# Patient Record
Sex: Male | Born: 1940
Health system: Southern US, Community
[De-identification: ages and names within clinical notes are randomized; demographics above are authoritative.]

## PROBLEM LIST (undated history)

## (undated) DIAGNOSIS — E785 Hyperlipidemia, unspecified: Secondary | ICD-10-CM

## (undated) DIAGNOSIS — H01029 Squamous blepharitis unspecified eye, unspecified eyelid: Secondary | ICD-10-CM

## (undated) DIAGNOSIS — R7989 Other specified abnormal findings of blood chemistry: Secondary | ICD-10-CM

## (undated) DIAGNOSIS — T387X5A Adverse effect of androgens and anabolic congeners, initial encounter: Secondary | ICD-10-CM

## (undated) DIAGNOSIS — IMO0002 Reserved for concepts with insufficient information to code with codable children: Secondary | ICD-10-CM

## (undated) DIAGNOSIS — E1165 Type 2 diabetes mellitus with hyperglycemia: Secondary | ICD-10-CM

## (undated) DIAGNOSIS — M818 Other osteoporosis without current pathological fracture: Secondary | ICD-10-CM

## (undated) DIAGNOSIS — H3530 Unspecified macular degeneration: Secondary | ICD-10-CM

## (undated) DIAGNOSIS — H547 Unspecified visual loss: Secondary | ICD-10-CM

## (undated) DIAGNOSIS — N400 Enlarged prostate without lower urinary tract symptoms: Secondary | ICD-10-CM

## (undated) DIAGNOSIS — N200 Calculus of kidney: Secondary | ICD-10-CM

## (undated) DIAGNOSIS — M1990 Unspecified osteoarthritis, unspecified site: Secondary | ICD-10-CM

## (undated) DIAGNOSIS — I10 Essential (primary) hypertension: Secondary | ICD-10-CM

## (undated) DIAGNOSIS — E1139 Type 2 diabetes mellitus with other diabetic ophthalmic complication: Secondary | ICD-10-CM

## (undated) HISTORY — DX: Hyperlipidemia, unspecified: E78.5

## (undated) HISTORY — DX: Type 2 diabetes mellitus with hyperglycemia: E11.65

## (undated) HISTORY — DX: Benign prostatic hyperplasia without lower urinary tract symptoms: N40.0

## (undated) HISTORY — DX: Unspecified macular degeneration: H35.30

## (undated) HISTORY — DX: Adverse effect of androgens and anabolic congeners, initial encounter: T38.7X5A

## (undated) HISTORY — DX: Squamous blepharitis unspecified eye, unspecified eyelid: H01.029

## (undated) HISTORY — DX: Reserved for concepts with insufficient information to code with codable children: IMO0002

## (undated) HISTORY — DX: Other specified abnormal findings of blood chemistry: R79.89

## (undated) HISTORY — DX: Essential (primary) hypertension: I10

## (undated) HISTORY — DX: Type 2 diabetes mellitus with other diabetic ophthalmic complication: E11.39

## (undated) HISTORY — DX: Other osteoporosis without current pathological fracture: M81.8

## (undated) SURGERY — Surgical Case
Anesthesia: *Unknown

---

## 1996-04-13 HISTORY — PX: BACK SURGERY: SHX140

## 1997-07-27 ENCOUNTER — Ambulatory Visit (HOSPITAL_COMMUNITY): Admission: RE | Admit: 1997-07-27 | Discharge: 1997-07-27 | Payer: Self-pay | Admitting: Specialist

## 2003-10-10 ENCOUNTER — Emergency Department (HOSPITAL_COMMUNITY): Admission: EM | Admit: 2003-10-10 | Discharge: 2003-10-10 | Payer: Self-pay | Admitting: Emergency Medicine

## 2005-09-11 HISTORY — PX: REFRACTIVE SURGERY: SHX103

## 2005-11-25 ENCOUNTER — Encounter: Admission: RE | Admit: 2005-11-25 | Discharge: 2005-11-25 | Payer: Self-pay | Admitting: Internal Medicine

## 2006-01-29 ENCOUNTER — Inpatient Hospital Stay (HOSPITAL_COMMUNITY): Admission: RE | Admit: 2006-01-29 | Discharge: 2006-02-09 | Payer: Self-pay | Admitting: General Surgery

## 2006-01-29 ENCOUNTER — Encounter (INDEPENDENT_AMBULATORY_CARE_PROVIDER_SITE_OTHER): Payer: Self-pay | Admitting: Specialist

## 2006-02-16 ENCOUNTER — Encounter: Admission: RE | Admit: 2006-02-16 | Discharge: 2006-02-16 | Payer: Self-pay | Admitting: Specialist

## 2006-03-02 ENCOUNTER — Encounter: Admission: RE | Admit: 2006-03-02 | Discharge: 2006-03-02 | Payer: Self-pay | Admitting: Specialist

## 2006-03-13 HISTORY — PX: BACK SURGERY: SHX140

## 2006-03-19 ENCOUNTER — Ambulatory Visit: Payer: Self-pay | Admitting: Pulmonary Disease

## 2006-03-19 ENCOUNTER — Inpatient Hospital Stay (HOSPITAL_COMMUNITY): Admission: RE | Admit: 2006-03-19 | Discharge: 2006-03-26 | Payer: Self-pay | Admitting: Specialist

## 2006-06-17 ENCOUNTER — Ambulatory Visit: Payer: Self-pay | Admitting: Physical Medicine & Rehabilitation

## 2006-06-17 ENCOUNTER — Encounter
Admission: RE | Admit: 2006-06-17 | Discharge: 2006-09-15 | Payer: Self-pay | Admitting: Physical Medicine & Rehabilitation

## 2006-07-22 ENCOUNTER — Encounter
Admission: RE | Admit: 2006-07-22 | Discharge: 2006-10-08 | Payer: Self-pay | Admitting: Physical Medicine & Rehabilitation

## 2006-08-10 ENCOUNTER — Ambulatory Visit: Payer: Self-pay | Admitting: Physical Medicine & Rehabilitation

## 2006-08-12 HISTORY — PX: CARPAL TUNNEL RELEASE: SHX101

## 2006-08-27 ENCOUNTER — Ambulatory Visit (HOSPITAL_COMMUNITY): Admission: RE | Admit: 2006-08-27 | Discharge: 2006-08-28 | Payer: Self-pay | Admitting: Specialist

## 2006-09-16 ENCOUNTER — Encounter: Admission: RE | Admit: 2006-09-16 | Discharge: 2006-12-15 | Payer: Self-pay | Admitting: Specialist

## 2006-09-28 ENCOUNTER — Emergency Department (HOSPITAL_COMMUNITY): Admission: EM | Admit: 2006-09-28 | Discharge: 2006-09-28 | Payer: Self-pay | Admitting: Emergency Medicine

## 2006-10-05 ENCOUNTER — Ambulatory Visit: Payer: Self-pay | Admitting: Physical Medicine & Rehabilitation

## 2006-10-05 ENCOUNTER — Encounter
Admission: RE | Admit: 2006-10-05 | Discharge: 2007-01-03 | Payer: Self-pay | Admitting: Physical Medicine & Rehabilitation

## 2006-12-01 ENCOUNTER — Ambulatory Visit: Payer: Self-pay | Admitting: Physical Medicine & Rehabilitation

## 2006-12-31 ENCOUNTER — Encounter
Admission: RE | Admit: 2006-12-31 | Discharge: 2007-03-31 | Payer: Self-pay | Admitting: Physical Medicine & Rehabilitation

## 2007-02-21 HISTORY — PX: CATARACT EXTRACTION: SUR2

## 2007-03-01 ENCOUNTER — Ambulatory Visit: Payer: Self-pay | Admitting: Physical Medicine & Rehabilitation

## 2007-05-09 ENCOUNTER — Encounter: Admission: RE | Admit: 2007-05-09 | Discharge: 2007-05-09 | Payer: Self-pay | Admitting: Family Medicine

## 2007-05-24 ENCOUNTER — Ambulatory Visit: Payer: Self-pay | Admitting: Physical Medicine & Rehabilitation

## 2007-05-24 ENCOUNTER — Encounter
Admission: RE | Admit: 2007-05-24 | Discharge: 2007-05-25 | Payer: Self-pay | Admitting: Physical Medicine & Rehabilitation

## 2007-06-20 ENCOUNTER — Emergency Department (HOSPITAL_COMMUNITY): Admission: EM | Admit: 2007-06-20 | Discharge: 2007-06-20 | Payer: Self-pay | Admitting: Emergency Medicine

## 2007-08-11 ENCOUNTER — Ambulatory Visit (HOSPITAL_BASED_OUTPATIENT_CLINIC_OR_DEPARTMENT_OTHER): Admission: RE | Admit: 2007-08-11 | Discharge: 2007-08-11 | Payer: Self-pay | Admitting: Urology

## 2007-08-11 HISTORY — PX: KIDNEY STONE SURGERY: SHX686

## 2007-08-11 HISTORY — PX: LITHOTRIPSY: SUR834

## 2007-08-17 ENCOUNTER — Ambulatory Visit: Payer: Self-pay | Admitting: Physical Medicine & Rehabilitation

## 2007-08-17 ENCOUNTER — Encounter
Admission: RE | Admit: 2007-08-17 | Discharge: 2007-08-17 | Payer: Self-pay | Admitting: Physical Medicine & Rehabilitation

## 2007-11-11 ENCOUNTER — Encounter
Admission: RE | Admit: 2007-11-11 | Discharge: 2008-01-11 | Payer: Self-pay | Admitting: Physical Medicine & Rehabilitation

## 2007-11-15 ENCOUNTER — Ambulatory Visit: Payer: Self-pay | Admitting: Physical Medicine & Rehabilitation

## 2008-01-11 ENCOUNTER — Ambulatory Visit: Payer: Self-pay | Admitting: Physical Medicine & Rehabilitation

## 2008-01-20 ENCOUNTER — Ambulatory Visit (HOSPITAL_COMMUNITY)
Admission: RE | Admit: 2008-01-20 | Discharge: 2008-01-20 | Payer: Self-pay | Admitting: Physical Medicine & Rehabilitation

## 2008-02-07 ENCOUNTER — Encounter
Admission: RE | Admit: 2008-02-07 | Discharge: 2008-05-07 | Payer: Self-pay | Admitting: Physical Medicine & Rehabilitation

## 2008-02-07 ENCOUNTER — Ambulatory Visit: Payer: Self-pay | Admitting: Physical Medicine & Rehabilitation

## 2008-02-13 ENCOUNTER — Encounter
Admission: RE | Admit: 2008-02-13 | Discharge: 2008-04-10 | Payer: Self-pay | Admitting: Physical Medicine & Rehabilitation

## 2008-02-21 ENCOUNTER — Inpatient Hospital Stay (HOSPITAL_COMMUNITY): Admission: EM | Admit: 2008-02-21 | Discharge: 2008-02-23 | Payer: Self-pay | Admitting: Emergency Medicine

## 2008-02-22 ENCOUNTER — Ambulatory Visit: Payer: Self-pay | Admitting: Vascular Surgery

## 2008-02-22 ENCOUNTER — Encounter (INDEPENDENT_AMBULATORY_CARE_PROVIDER_SITE_OTHER): Payer: Self-pay | Admitting: Emergency Medicine

## 2008-03-07 ENCOUNTER — Ambulatory Visit: Payer: Self-pay | Admitting: Physical Medicine & Rehabilitation

## 2008-05-08 ENCOUNTER — Encounter
Admission: RE | Admit: 2008-05-08 | Discharge: 2008-08-06 | Payer: Self-pay | Admitting: Physical Medicine & Rehabilitation

## 2008-05-09 ENCOUNTER — Ambulatory Visit: Payer: Self-pay | Admitting: Physical Medicine & Rehabilitation

## 2008-05-11 ENCOUNTER — Encounter
Admission: RE | Admit: 2008-05-11 | Discharge: 2008-06-25 | Payer: Self-pay | Admitting: Physical Medicine & Rehabilitation

## 2008-05-28 ENCOUNTER — Ambulatory Visit: Payer: Self-pay | Admitting: Physical Medicine & Rehabilitation

## 2008-06-08 ENCOUNTER — Ambulatory Visit (HOSPITAL_COMMUNITY)
Admission: RE | Admit: 2008-06-08 | Discharge: 2008-06-08 | Payer: Self-pay | Admitting: Physical Medicine & Rehabilitation

## 2008-06-25 ENCOUNTER — Ambulatory Visit: Payer: Self-pay | Admitting: Physical Medicine & Rehabilitation

## 2008-07-20 ENCOUNTER — Ambulatory Visit: Payer: Self-pay | Admitting: Physical Medicine & Rehabilitation

## 2008-09-10 ENCOUNTER — Inpatient Hospital Stay (HOSPITAL_COMMUNITY): Admission: EM | Admit: 2008-09-10 | Discharge: 2008-09-14 | Payer: Self-pay | Admitting: Emergency Medicine

## 2008-09-12 ENCOUNTER — Encounter
Admission: RE | Admit: 2008-09-12 | Discharge: 2008-12-11 | Payer: Self-pay | Admitting: Physical Medicine & Rehabilitation

## 2008-09-16 ENCOUNTER — Emergency Department (HOSPITAL_COMMUNITY): Admission: EM | Admit: 2008-09-16 | Discharge: 2008-09-16 | Payer: Self-pay | Admitting: Emergency Medicine

## 2008-10-04 ENCOUNTER — Ambulatory Visit (HOSPITAL_COMMUNITY): Admission: RE | Admit: 2008-10-04 | Discharge: 2008-10-04 | Payer: Self-pay | Admitting: Urology

## 2008-10-09 ENCOUNTER — Encounter
Admission: RE | Admit: 2008-10-09 | Discharge: 2008-10-09 | Payer: Self-pay | Admitting: Physical Medicine & Rehabilitation

## 2008-10-09 ENCOUNTER — Ambulatory Visit: Payer: Self-pay | Admitting: Physical Medicine & Rehabilitation

## 2008-11-07 ENCOUNTER — Ambulatory Visit: Payer: Self-pay | Admitting: Physical Medicine & Rehabilitation

## 2008-11-19 ENCOUNTER — Ambulatory Visit (HOSPITAL_COMMUNITY)
Admission: RE | Admit: 2008-11-19 | Discharge: 2008-11-19 | Payer: Self-pay | Admitting: Physical Medicine & Rehabilitation

## 2008-12-24 ENCOUNTER — Encounter
Admission: RE | Admit: 2008-12-24 | Discharge: 2008-12-26 | Payer: Self-pay | Admitting: Physical Medicine & Rehabilitation

## 2008-12-26 ENCOUNTER — Ambulatory Visit: Payer: Self-pay | Admitting: Physical Medicine & Rehabilitation

## 2009-04-16 ENCOUNTER — Encounter
Admission: RE | Admit: 2009-04-16 | Discharge: 2009-07-15 | Payer: Self-pay | Admitting: Physical Medicine & Rehabilitation

## 2009-04-17 ENCOUNTER — Ambulatory Visit: Payer: Self-pay | Admitting: Physical Medicine & Rehabilitation

## 2009-07-31 ENCOUNTER — Encounter
Admission: RE | Admit: 2009-07-31 | Discharge: 2009-08-07 | Payer: Self-pay | Admitting: Physical Medicine & Rehabilitation

## 2009-08-07 ENCOUNTER — Ambulatory Visit: Payer: Self-pay | Admitting: Physical Medicine & Rehabilitation

## 2009-08-19 ENCOUNTER — Encounter: Admission: RE | Admit: 2009-08-19 | Discharge: 2009-09-18 | Payer: Self-pay | Admitting: Family Medicine

## 2010-01-24 ENCOUNTER — Encounter
Admission: RE | Admit: 2010-01-24 | Discharge: 2010-01-24 | Payer: Self-pay | Source: Home / Self Care | Attending: Physical Medicine & Rehabilitation | Admitting: Physical Medicine & Rehabilitation

## 2010-04-30 ENCOUNTER — Encounter
Admission: RE | Admit: 2010-04-30 | Discharge: 2010-04-30 | Payer: Self-pay | Source: Home / Self Care | Attending: Family Medicine | Admitting: Family Medicine

## 2010-07-16 ENCOUNTER — Other Ambulatory Visit: Payer: Self-pay | Admitting: Family Medicine

## 2010-07-16 DIAGNOSIS — I6529 Occlusion and stenosis of unspecified carotid artery: Secondary | ICD-10-CM

## 2010-07-21 ENCOUNTER — Ambulatory Visit
Admission: RE | Admit: 2010-07-21 | Discharge: 2010-07-21 | Disposition: A | Discharge status: Home / Self Care | Payer: PRIVATE HEALTH INSURANCE | Source: Ambulatory Visit | Attending: Family Medicine | Admitting: Family Medicine

## 2010-07-21 ENCOUNTER — Other Ambulatory Visit: Payer: Self-pay

## 2010-07-21 DIAGNOSIS — I6529 Occlusion and stenosis of unspecified carotid artery: Secondary | ICD-10-CM

## 2010-07-21 LAB — GLUCOSE, CAPILLARY
Glucose-Capillary: 149 mg/dL — ABNORMAL HIGH (ref 70–99)
Glucose-Capillary: 240 mg/dL — ABNORMAL HIGH (ref 70–99)
Glucose-Capillary: 115 mg/dL — ABNORMAL HIGH (ref 70–99)
Glucose-Capillary: 117 mg/dL — ABNORMAL HIGH (ref 70–99)
Glucose-Capillary: 123 mg/dL — ABNORMAL HIGH (ref 70–99)
Glucose-Capillary: 130 mg/dL — ABNORMAL HIGH (ref 70–99)
Glucose-Capillary: 146 mg/dL — ABNORMAL HIGH (ref 70–99)
Glucose-Capillary: 147 mg/dL — ABNORMAL HIGH (ref 70–99)
Glucose-Capillary: 149 mg/dL — ABNORMAL HIGH (ref 70–99)
Glucose-Capillary: 167 mg/dL — ABNORMAL HIGH (ref 70–99)
Glucose-Capillary: 169 mg/dL — ABNORMAL HIGH (ref 70–99)
Glucose-Capillary: 205 mg/dL — ABNORMAL HIGH (ref 70–99)
Glucose-Capillary: 211 mg/dL — ABNORMAL HIGH (ref 70–99)
Glucose-Capillary: 212 mg/dL — ABNORMAL HIGH (ref 70–99)
Glucose-Capillary: 217 mg/dL — ABNORMAL HIGH (ref 70–99)
Glucose-Capillary: 97 mg/dL (ref 70–99)

## 2010-07-21 LAB — BASIC METABOLIC PANEL WITH GFR
BUN: 20 mg/dL (ref 6–23)
CO2: 25 meq/L (ref 19–32)
Calcium: 7.9 mg/dL — ABNORMAL LOW (ref 8.4–10.5)
Chloride: 100 meq/L (ref 96–112)
Creatinine, Ser: 1.57 mg/dL — ABNORMAL HIGH (ref 0.4–1.5)
GFR calc non Af Amer: 44 mL/min — ABNORMAL LOW
Glucose, Bld: 228 mg/dL — ABNORMAL HIGH (ref 70–99)
Potassium: 3.8 meq/L (ref 3.5–5.1)
Sodium: 135 meq/L (ref 135–145)

## 2010-07-21 LAB — CBC
HCT: 35.8 % — ABNORMAL LOW (ref 39.0–52.0)
HCT: 37.1 % — ABNORMAL LOW (ref 39.0–52.0)
HCT: 38.6 % — ABNORMAL LOW (ref 39.0–52.0)
Hemoglobin: 11.9 g/dL — ABNORMAL LOW (ref 13.0–17.0)
Hemoglobin: 12.6 g/dL — ABNORMAL LOW (ref 13.0–17.0)
Hemoglobin: 13.1 g/dL (ref 13.0–17.0)
MCHC: 33.2 g/dL (ref 30.0–36.0)
MCHC: 33.8 g/dL (ref 30.0–36.0)
MCHC: 33.9 g/dL (ref 30.0–36.0)
MCV: 92.3 fL (ref 78.0–100.0)
MCV: 92.7 fL (ref 78.0–100.0)
MCV: 92.7 fL (ref 78.0–100.0)
Platelets: 165 10*3/uL (ref 150–400)
Platelets: 187 10*3/uL (ref 150–400)
Platelets: 196 10*3/uL (ref 150–400)
RBC: 3.86 MIL/uL — ABNORMAL LOW (ref 4.22–5.81)
RBC: 4.02 MIL/uL — ABNORMAL LOW (ref 4.22–5.81)
RBC: 4.17 MIL/uL — ABNORMAL LOW (ref 4.22–5.81)
RDW: 14 % (ref 11.5–15.5)
RDW: 14.3 % (ref 11.5–15.5)
RDW: 14.4 % (ref 11.5–15.5)
WBC: 10.2 10*3/uL (ref 4.0–10.5)
WBC: 10.9 10*3/uL — ABNORMAL HIGH (ref 4.0–10.5)
WBC: 13.9 10*3/uL — ABNORMAL HIGH (ref 4.0–10.5)

## 2010-07-21 LAB — CULTURE, BLOOD (ROUTINE X 2)
Culture: NO GROWTH
Culture: NO GROWTH

## 2010-07-21 LAB — BASIC METABOLIC PANEL
BUN: 11 mg/dL (ref 6–23)
BUN: 17 mg/dL (ref 6–23)
BUN: 17 mg/dL (ref 6–23)
CO2: 27 mEq/L (ref 19–32)
CO2: 28 mEq/L (ref 19–32)
CO2: 29 mEq/L (ref 19–32)
Calcium: 7.3 mg/dL — ABNORMAL LOW (ref 8.4–10.5)
Calcium: 7.5 mg/dL — ABNORMAL LOW (ref 8.4–10.5)
Calcium: 7.6 mg/dL — ABNORMAL LOW (ref 8.4–10.5)
Chloride: 104 mEq/L (ref 96–112)
Chloride: 106 mEq/L (ref 96–112)
Chloride: 98 mEq/L (ref 96–112)
Creatinine, Ser: 1.3 mg/dL (ref 0.4–1.5)
Creatinine, Ser: 1.44 mg/dL (ref 0.4–1.5)
Creatinine, Ser: 1.45 mg/dL (ref 0.4–1.5)
GFR calc Af Amer: 59 mL/min — ABNORMAL LOW (ref >60)
GFR calc Af Amer: 59 mL/min — ABNORMAL LOW (ref >60)
GFR calc Af Amer: >60 mL/min (ref >60)
GFR calc non Af Amer: 49 mL/min — ABNORMAL LOW (ref >60)
GFR calc non Af Amer: 49 mL/min — ABNORMAL LOW (ref >60)
GFR calc non Af Amer: 55 mL/min — ABNORMAL LOW (ref >60)
Glucose, Bld: 151 mg/dL — ABNORMAL HIGH (ref 70–99)
Glucose, Bld: 153 mg/dL — ABNORMAL HIGH (ref 70–99)
Glucose, Bld: 167 mg/dL — ABNORMAL HIGH (ref 70–99)
Potassium: 3 mEq/L — ABNORMAL LOW (ref 3.5–5.1)
Potassium: 3.9 mEq/L (ref 3.5–5.1)
Potassium: 4.4 mEq/L (ref 3.5–5.1)
Sodium: 132 mEq/L — ABNORMAL LOW (ref 135–145)
Sodium: 137 mEq/L (ref 135–145)
Sodium: 139 mEq/L (ref 135–145)

## 2010-07-21 LAB — MAGNESIUM: Magnesium: 1.9 mg/dL (ref 1.5–2.5)

## 2010-07-22 LAB — DIFFERENTIAL
Basophils Absolute: 0 10*3/uL (ref 0.0–0.1)
Basophils Relative: 0 % (ref 0–1)
Eosinophils Absolute: 0 10*3/uL (ref 0.0–0.7)
Eosinophils Relative: 0 % (ref 0–5)
Lymphocytes Relative: 9 % — ABNORMAL LOW (ref 12–46)
Lymphs Abs: 1 10*3/uL (ref 0.7–4.0)
Monocytes Absolute: 0.6 10*3/uL (ref 0.1–1.0)
Monocytes Relative: 6 % (ref 3–12)
Neutro Abs: 9.6 10*3/uL — ABNORMAL HIGH (ref 1.7–7.7)
Neutrophils Relative %: 85 % — ABNORMAL HIGH (ref 43–77)

## 2010-07-22 LAB — URINE CULTURE: Colony Count: >=100000

## 2010-07-22 LAB — COMPREHENSIVE METABOLIC PANEL
ALT: 18 U/L (ref 0–53)
AST: 24 U/L (ref 0–37)
Albumin: 3.3 g/dL — ABNORMAL LOW (ref 3.5–5.2)
Alkaline Phosphatase: 66 U/L (ref 39–117)
BUN: 22 mg/dL (ref 6–23)
CO2: 27 mEq/L (ref 19–32)
Calcium: 8.8 mg/dL (ref 8.4–10.5)
Chloride: 96 mEq/L (ref 96–112)
Creatinine, Ser: 1.73 mg/dL — ABNORMAL HIGH (ref 0.4–1.5)
GFR calc Af Amer: 48 mL/min — ABNORMAL LOW (ref >60)
GFR calc non Af Amer: 40 mL/min — ABNORMAL LOW (ref >60)
Glucose, Bld: 159 mg/dL — ABNORMAL HIGH (ref 70–99)
Potassium: 3.6 mEq/L (ref 3.5–5.1)
Sodium: 135 mEq/L (ref 135–145)
Total Bilirubin: 1.2 mg/dL (ref 0.3–1.2)
Total Protein: 7.4 g/dL (ref 6.0–8.3)

## 2010-07-22 LAB — URINE MICROSCOPIC-ADD ON

## 2010-07-22 LAB — CBC
HCT: 43.9 % (ref 39.0–52.0)
Hemoglobin: 14 g/dL (ref 13.0–17.0)
MCHC: 31.9 g/dL (ref 30.0–36.0)
MCV: 92.8 fL (ref 78.0–100.0)
Platelets: 236 10*3/uL (ref 150–400)
RBC: 4.74 MIL/uL (ref 4.22–5.81)
RDW: 14 % (ref 11.5–15.5)
WBC: 11.2 10*3/uL — ABNORMAL HIGH (ref 4.0–10.5)

## 2010-07-22 LAB — URINALYSIS, ROUTINE W REFLEX MICROSCOPIC
Glucose, UA: 100 mg/dL — AB
Ketones, ur: >80 mg/dL — AB
Nitrite: POSITIVE — AB
Protein, ur: 100 mg/dL — AB
Specific Gravity, Urine: 1.026 (ref 1.005–1.030)
Urobilinogen, UA: 1 mg/dL (ref 0.0–1.0)
pH: 6.5 (ref 5.0–8.0)

## 2010-07-22 LAB — LIPASE, BLOOD: Lipase: 16 U/L (ref 11–59)

## 2010-07-22 LAB — GLUCOSE, CAPILLARY: Glucose-Capillary: 186 mg/dL — ABNORMAL HIGH (ref 70–99)

## 2010-08-26 NOTE — Assessment & Plan Note (Signed)
Scott Willis is back regarding his chronic pain.  He has had some issues  with his eyes and with some kidney stones since I last saw him.  His  back, actually, has been doing fairly well.  The amitriptyline has  helped his paresthesias at night.  He has run out of his MS Contin now  for a few weeks, and only was taking it on a daily basis, regardless.  He feels that he is at a point where he can try to stay off of the  medication.  He rates his pain at rest in the back and extremities at a  3 out of 10.  He describes it as burning, aching, sometimes tingling.  Sleep is poor.  Pain usually increases with prolonged sitting and  standing and while he is at work.  Improves with rest, his medications,  his back brace, etc.   REVIEW OF SYSTEMS:  Review of systems are notable for the above.  He  still has some tingling in the hands, but pain has improved.  He has had  some weight loss, constipation, pain.  His vision continues to be a  problem.   SOCIAL HISTORY:  The patient lives alone and is smoking 1+ pack a day.   PHYSICAL EXAMINATION:  Blood pressure 131/58, pulse is 98, respiratory  rate 18, he is satting 97% on room air.  The patient is pleasant, alert and oriented x3.  Affect is bright and  appropriate.  Vision is still poor.  His vision is not functional in the left eye.  He continues to have decreased sensation over the hands in the ulnar  distribution primarily.  Posture is fair.  He has fair range of motion at the lumbar spine.  Strength is generally  5 out of 5 except for instrinsic weakness of the hands.  Reflexes are  2+.  Sensory exam as noted above.   ASSESSMENT:  1. Chronic low back pain related to lumbar spondylosis, compression      fracture, post laminectomy syndrome/facet arthropathy.  2. Bilateral ulnar nerve and median nerve compression, right greater      than left.  3. History of cancerous polyps.  4. History of peripheral neuropathy.  5. Diabetes.  6. Tobacco  abuse.  7. Hypertension.  8. History of nephrolithiasis.   PLAN:  1. Continue Neurontin and Elavil as dosed.  2. Recommended brace wear as needed, particularly if his back fatigue      becomes painful at work.  I would not make it a routine to wear it      every day on a scheduled basis.  3. Lidoderm patches p.r.n.  4. Will not fill MS Contin today.  Observe for his pain tolerance      without.  He does have a few hydrocodones through Dr. Imelda Pillow      office for his procedures.  He can use these for his breakthrough      pain if needed for      now.  5. I will see him back in about 6 months' time.      Ranelle Oyster, M.D.  Electronically Signed     ZTS/MedQ  D:  08/17/2007 10:23:36  T:  08/17/2007 11:20:52  Job #:  [id number]   cc:   Lillia Carmel, M.D.  Fax: [phone]

## 2010-08-26 NOTE — H&P (Signed)
NAME:  Scott Willis, Scott Willis NO.:  [id number]   MEDICAL RECORD NO.:  [id number]          PATIENT TYPE:  EMS   LOCATION:  ED                           FACILITY:  [facility]   PHYSICIAN:  Zannie Cove, MD     DATE OF BIRTH:  1941/01/25   DATE OF ADMISSION:  09/10/2008  DATE OF DISCHARGE:                              HISTORY & PHYSICAL   PRIMARY CARE PHYSICIAN:  Lillia Carmel, M.D.   CARDIOLOGIST:  Dr. Ellwood Handler.   CHIEF COMPLAINT:  This is a 70 year old gentleman with a history of  known kidney stones on the right side and chronic back pain who presents  to the emergency room with right-sided flank pain which is severe,  intermittent, sharp, about 10/10, radiating  to the groin.  He has been  bearing severe pain for the last 7-10 days at home. He also reports  fevers and chills, sweats for the last 1 week. He denies any hematuria.  He denies any dysuria or urinary frequency.  He tried to call his  orthopedic surgeon for back pain. However, he was unable to get a hold  of him. He lives at home. He lives alone at his shop.   PAST MEDICAL HISTORY:  Significant for  1,  Diabetes.  1. Chronic back pain.  2. History of kidney stones.  3. Hypertension.  4. Dyslipidemia.  5. Osteoporosis.   PAST SURGICAL HISTORY:  Back surgery x2, appendectomy, hemicolectomy,  and laser surgery for kidney stone on the left side in April of 2009.   ALLERGIES:  No known drug allergies.   MEDICATIONS:  Metformin 850 mg p.o. t.i.d., gabapentin 600 mg p.o.  t.i.d., Cymbalta 60 mg once per day, vitamin D 50,000 Units 1 p.o. once  a week, aspirin 81 mg once per day, and testosterone injections for his  osteoporosis.   ALLERGIES:  No known drug allergies.   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  He has smoked about 1  pack per day for the past 50 years. He denies any alcohol.   FAMILY HISTORY:  Significant for diabetes.   REVIEW OF SYSTEMS:  A 12-system review was negative  except for as in  HPI.   PHYSICAL EXAMINATION:  VITAL SIGNS:  T max 102.4.  Current temperature  100.9, pulse 99, blood pressure 174/82, improved to 144/80.  Respirations 18, satting 98% on 2 L.  GENERAL:  Awake and alert, oriented x 3.  Intermittently in moderate  distress secondary to pain.  HEENT: Pupils are equal, round, and reactive to light.  CARDIOVASCULAR: S1 and S2. Regular rate and rhythm.  LUNGS:  Clear to auscultation.  ABDOMEN:  Soft and nontender and nondistended.  Positive bowel sounds.  Severe right flank and CVA tenderness.  EXTREMITIES:  No clubbing, cyanosis, or edema.  NEUROLOGICAL: Exam is nonfocal.   LABS:  White count 11.2, neutrophils 355, hemoglobin 14, platelets 236.  Sodium 136, potassium 2.6, chloride 96, bicarb 27, BUN 22, creatinine  1.7, up from 1.1.  Glucose 159, lipase 16.  UA is amber color cloudy  with ketones greater than  80, large amount of blood, protein 100,  moderate leukocytes, positive nitrites, WBCs too many to count, RBCs too  many to count, bacteria many.   ASSESSMENT AND PLAN:  56. A 70 year old gentleman with acute complicated pyelonephritis in      the setting of diabetes and kidney stone.  Will get a CT of his      abdomen to noncontrast stone protocol.  Will start the patient on      IV cefepime.  Will check urine culture and blood cultures x2. If      there is evidence of obstructive stone or hydronephrosis, will get      a urology consult.  IV fluids, normal saline at 100 mL an hour.      Pain control with Dilaudid 2-4 mg q.2-4 h. p.r.n.  2. Acute renal failure and chronic kidney disease likely secondary to      dehydration.  In addition, noncontrast CT of the abdomen to      evaluate for obstruction.  Will follow his I's and O's ,      chemistries,hydrate with normal saline at 100 mL an hour.  3. Diabetes.  Hold metformin.  Put him on an ADA diet and sliding-      scale insulin.  4. Deep vein thrombosis prophylaxis with Lovenox  30 mg daily.  5. The patient is a full code.      Zannie Cove, MD  Electronically Signed     PJ/MEDQ  D:  09/10/2008  T:  09/10/2008  Job:  [id number]

## 2010-08-26 NOTE — Consult Note (Signed)
NAME:  COHAN, STIPES NO.:  [id number]   MEDICAL RECORD NO.:  [id number]          PATIENT TYPE:  INP   LOCATION:  0108                         FACILITY:  [facility]   PHYSICIAN:  Lindaann Slough, M.D.  DATE OF BIRTH:  1940/06/09   DATE OF CONSULTATION:  09/10/2008  DATE OF DISCHARGE:                                 CONSULTATION   REASON FOR CONSULTATION:  Fever, right flank pain, and right upper  ureteral stone.   HISTORY OF PRESENT ILLNESS:  The patient is a 70 year old male who came  to the emergency room today with a 10-day history of back and right  flank pain.  He has a history of back pain and had back surgery in 1998  and December 2007.  He was doing well until 05/20 when he fell down and  hurt his back.  Since then he has been having back and right flank pain  on and off.  The pain got progressively worse and he came to the  emergency room today.  He had a CT scan that showed bilateral renal  calculi, moderate right hydronephrosis, and an 11 mm proximal right  ureteral stone.  There is mild bilateral perinephric stranding.  The  patient has a past history of kidney stone and had left ureteral stone  extraction done on August 11, 2007 by Dr. Patsi Sears.  He does not have  any voiding symptoms.  He has no frequency, urgency, dysuria, or  hematuria.  He stated that he had a normal PSA at his primary care  physician's office.   PAST MEDICAL HISTORY:  1. Diabetes.  2. Hypertension.  3. Hypercholesterolemia.  4. Macular degeneration.   PAST SURGICAL HISTORY:  1. He had colon resection in July 2007 for a large polyp.  2. He had back surgery in July 1998 and December 2007.  3. He had carpal tunnel surgery in May 2008.  4. Cataract surgery in November 2008.  5. Left ureteral stone extraction on August 11, 2007.   FAMILY HISTORY:  His father died of prostate cancer 32 years ago.  His  mother is deceased of unknown causes to him.   SOCIAL HISTORY:  He is  divorced.  He has one daughter.  He has smoked 1  pack a day for 54 years and does not drink.   ALLERGIES:  He has no known drug allergies.  He has allergic to SURGICAL  TAPE and BEE STING.   MEDICATIONS:  1. Metformin 850 mg 3 times a day.  2. Cymbalta 60 mg a day.  3. Vitamin D 50,000 units a week.  4. Aspirin 81 mg a day.  5. Gabapentin 600 mg 3 times a day.  6. Testosterone injection for osteoporosis.   REVIEW OF SYSTEMS:  As noted in the HPI and everything else is negative.   PHYSICAL EXAMINATION:  GENERAL:  This is a well-developed 70 year old  male who is complaining of back and right flank pain.  He is alert and  oriented to time, place, and person.  VITAL SIGNS:  Blood pressure is 174/82, pulse 99, temperature maximum  102.4.  HEENT:  His head is normal.  He has pink conjunctivae.  Ears, nose, and  throat are within normal limits.  NECK:  Supple.  He has no cervical adenopathy.  No thyromegaly.  CHEST:  Symmetrical.  LUNGS:  Fully expanded and clear to percussion and auscultation.  HEART:  Regular rhythm.  No murmur, no gallops.  ABDOMEN:  Soft.  He has a well-healed surgical scar in the right upper  quadrant.  He has some surgical scar in his back.  He has moderate right  CVA tenderness.  Kidneys are not palpable.  Bladder is not distended.  He has no hepatomegaly, no splenomegaly.  He has a right inguinal  hernia.  Bowel sounds are normal.  GENITALIA:  Penis is circumcised.  Meatus is normal.  Scrotum is normal.  He has no testicular mass.  Cords and epididymis are within normal  limits.  RECTAL:  He has no external hemorrhoids.  Sphincter tone is normal.  Prostate is enlarged to 50 g without any nodules and seminal vesicles  are not palpable.  EXTREMITIES:  Within normal limits.  He has no pedal edema.  No  deformities.  He has good peripheral pulses.   LABORATORY:  Hemoglobin is 14.0, hematocrit 43.9, WBC 11.2, BUN 22,  creatinine 1.73.  Sodium 135, potassium  3.6.  Urine is cloudy and  has  many bacteria, too numerous to count RBCs, too numerous to count WBCs.   I have independently reviewed the CT scan and it shows bilateral renal  stone.  Moderate right hydronephrosis, down to an 11 mm stone in the  proximal ureter.   IMPRESSION:  1. Bilateral renal calculi.  2. Right hydronephrosis.  3. Right proximal ureteral stone.  4. Urinary tract infection.  5. Diabetes.  6. Hypertension.   PLAN:  The patient needs cystoscopy and insertion of double-J stent.  The procedure, risks and benefits were discussed with the patient.  He  understands and agrees to proceed.      Lindaann Slough, M.D.  Electronically Signed     MN/MEDQ  D:  09/10/2008  T:  09/10/2008  Job:  [id number]

## 2010-08-26 NOTE — Assessment & Plan Note (Signed)
Scott Willis is back regarding his chronic back pain and leg symptoms.  He  saw Dr. Prince Rome recently and Dr. Prince Rome was wondering about other  treatments for his back. We had reviewed potential treatments at length  at out last visit here in October.  The patient only seemed to recall  partially the conversation.  He has been having physical therapy.  He  apparently went to the hospital after a syncopal episode.  He was told  that it was likely due to narcotic medications.  He had been on MS  Contin but had not taken it for 3 or 4 days prior to this event and only  had 1 immediate release Morphine that morning which was usually his  routine.  He is back in therapy now.  He has had no other spells where  he has gotten lightheaded.  The patient rates his pain 3/10 and usually  it is in the right mid-to-low back.  The pain seemed to be a bit higher  today in his description than we had discussed previously.  We reviewed  his MRI again which is notable for the old T12 compression fracture with  3 mm of retropulsion of bone into the spinal canal.  He has L4-L5 and  L5-S1 mild disk disease with facet hypertrophy bilaterally more in the  right. The patient still is doing his work.  He is still having issues  with his eyes regarding his retinopathy.  Sleep is overall poor due to  his back pain.  Dr. Prince Rome started him back on his Cymbalta recently  because he has drug habits for this now again.   REVIEW OF SYSTEMS:  Notable for numbness, tingling, bowel control  problems, weight loss, constipation at times, and occasional abdominal  pain.  Full review is in the written health and history section.  Other  pertinent positives are as above.   SOCIAL HISTORY:  The patient lives alone.  He is divorced. He is still  smoking heavily each day.   PHYSICAL EXAMINATION:  VITAL SIGNS:  Blood pressure 158/80, pulse is 92,  respiratory rate 18, and he is sating 99% on room air.  GENERAL:  The patient is generally  pleasant.  Vision is unchanged. He  stands in a kyphotic posture particularly in the thoracic spine.  He  ambulates with a slightly wide-based gait. He is stable and stands.  He  has pain with palpation over the prior operative segments of his back.  He has a lot of muscle tightness on the right paraspinals from T10-L4.  He tends to rotate a bit to the right in the spine as well.  MUSCULOSKELETAL:  Strength is generally 5/5 with 1+ reflexes.  Sensation  is intermittently invariably decreased particularly in distal limbs.  He  has some mild pain in the lower lumbar segments and towards the SI  joints but this seems to be less in severity compared to the high lumbar  and lower thoracic area.   ASSESSMENT:  1. Persistent back pain likely related to his postlaminectomy and post      hardware removal complications.  He also has a T12 compression      fracture with retropulsion.  There may be some concomitant facet      disease.  Posture is also a big factor here.  We reviewed his MRI      again at length as well as other contributing factors.  2. Diabetes with peripheral neuropathy.  3. Macular degeneration.  4.  Hypertension.  5. Severe tobacco abuse.  6. Ill-defined right hip/groin pain.   PLAN:  1. I agree with resumption of Cymbalta.  I did not really realize that      he was off of it until today.  I Hope that this helps somewhat with      his leg symptoms.  2. Continue with physical therapy to work on strengthening, posture,      and range of motion of his low back.  I think this is really the      key here.  3. We injected 3 trigger points along the right thoracolumbar      paraspinals from T11-L3.  We used 2 mL of 1% lidocaine in each      injection.  The patient tolerated well.  We will see how he      responds to this.  Could consider facet injections perhaps down the      road.  May look at a thoracolumbar extension orthosis as well as      his current brace is really not  appropriate for what his likely      needs are and started to show signs of wear and tear.  4. Continue testosterone supplementation.  5. Continue Elavil and Neurontin as written.  6. We will stop long-acting Morphine and continue with MSIR 15 mg 1      q.8 h. p.r.n.  I gave him 46 today which can be refilled after      March 22, 2008.  7. We discussed again the problems with his tobacco abuse and how this      affects his pain and neuropathy and the patient understands but      likely will not change his habits.  8. I think his episode was likely syncopal in nature at physical      therapy.  It is highly unlikely it was related to his narcotics.  I      asked him to watch closely for signs and further symptoms.  He is      to make sure he is drinking plenty of fluids and taking his time in      changing positions.  He is certainly susceptible for sympathetic      dysfunction considering the severity of his diabetic disease.  9. We will see him back in 6 weeks.      Scott Willis, M.D.  Electronically Signed     ZTS/MedQ  D:  03/07/2008 10:24:04  T:  03/07/2008 20:43:19  Job #:  [id number]   cc:   Scott Willis, M.D.  Fax: [phone]   Scott Willis, M.D.  Fax: [phone]

## 2010-08-26 NOTE — Assessment & Plan Note (Signed)
Scott Willis is back regarding his L3, L4, and L5 right medial branch  blocks.  He had one of the results regarding his back pain and states  that his groin pain as well has improved.  His pain is 3/10 in general.  He does occasionally peak with prolonged sitting or with activity.  He  uses the morphine at night to help him with pain and sleep at night, but  overall is very pleased with his progress.  Pain interferes with general  activity, relations with others, and enjoyment of life on a minimal  level.  The patient remains on Neurontin, also takes Cymbalta 60 mg  q.a.m.   REVIEW OF SYSTEMS:  Notable for numbness, tingling, occasional  constipation.  Other pertinent positives are above.  Full review is in  the written health and history section of the chart.   SOCIAL HISTORY:  The patient lives alone and is divorced.  He is still  smoking a pack of cigarettes per day.  He works full-time with his  [employer].   PHYSICAL EXAMINATION:  Blood pressure 152/62, pulse is 77, respiratory  rate is 18.  He is sating 99% on room air.  The patient is pleasant,  alert and oriented x3.  Affect is bright and appropriate.  He had some  mild pain with lumbar extension and flexion to the right as well as  facet maneuvers.  Scott Willis was mildly tender to palpation today.  Strength  generally is 5/5.  He had no groin pain on the right side.  Posture was  improved.  He was alert.  Balance was excellent.  Heart was regular  rate.  Chest was clear.  Abdomen was soft, nontender.   ASSESSMENT:  1. Chronic low back pain/post-laminectomy syndrome/lumbar facet      arthropathy.  2. Diabetic peripheral neuropathy.  3. Right groin pain resolved after medial branch blocks above.   PLAN:  1. We will continue with Cymbalta, Elavil, and Neurontin as previously      prescribed.  2. Flexeril p.r.n. for spasms.  3. MSIR 15 mg q.12 h p.r.n. breakthrough pain.  4. We will hold off on obturator nerve block as we  have talked about      previously.  5. Encouraged exercise, range of motion, good posture, etc.  6. I will see him back in 4 months.  We will have him check with      nursing in 2 months' time.  I am very pleased with his progress.      Ranelle Oyster, M.D.  Electronically Signed     ZTS/MedQ  D:  07/20/2008 10:41:46  T:  07/20/2008 23:58:17  Job #:  [id number]   cc:   Lillia Carmel, M.D.  Fax: [phone]   Kerrin Champagne, M.D.  Fax: [phone]

## 2010-08-26 NOTE — Assessment & Plan Note (Signed)
Scott Willis is  back regarding his chronic pain.  His back has been doing  better with the back brace.  He is wearing it usually 5 to 6 days as  week throughout the day.  He states that it has made a world of  difference for him.  The Neurontin has helped him a bit with his  dysesthesias in the hands but he still has complaints there.  He has cut  back on some of his other medications and he is feeling better overall  from an energy standpoint.  No new surgical plan has been made for his  hands and it looks as if it is a wait and see type of situation there.  He does not use his TENS currently.  He is continuing with the Tofranil  at bedtime for sleep which helps somewhat.  Patient continues to work  full-time with his [employer].   REVIEW OF SYSTEMS:  Notable for the above.  A full review is in the  written Health & History Section of the chart.   SOCIAL HISTORY:  The patient is working.  He is continuing to try to cut  back on his cigarettes but still smokes quite regularly.   PHYSICAL EXAM:  Blood pressure is 144/76, pulse is 90, respiratory rate  18, sating 99% on room air.  The patient is pleasant, alert and oriented  x3.  He is wearing sunglasses today after a recent cataract surgery.  Gait was stable.  He had some pain with movement of the back,  particularly flexion, extension.  Continues to have ulnar nerve signs in  both hands with wasting noted.  He has distal sensory loss in both  hands.  His affect is bright and appropriate.  Cognition was within  normal limits.   ASSESSMENT:  1. Chronic low back pain related to lumbar spondylosis with      compression fracture/post laminectomy syndrome/facet arthropathy.  2. Bilateral ulnar and medial nerve compression status post surgical      decompression on the right.  3. History of cancerous polyps.  4. History of peripheral neuropathy.   PLAN:  1. Increase Neurontin up to 300 mg t.i.d. over the next 2 weeks' time.  2.  Continue Tofranil at bedtime.  3. New back brace, although I would prefer to see him wear it when he      needs it rather than on a scheduled basis.  A good idea would be to      start his day and go as long as he can until his back begins to      fatigue and then place the brace on the back.  4. Continue MS Contin 15 mg q.6 hours p.r.n. breakthrough pain.  He is      using less of this every time.  5. I will see him back in about 3 months.  He is really doing quite      well overall.      Ranelle Oyster, M.D.  Electronically Signed     ZTS/MedQ  D:  03/02/2007 10:22:53  T:  03/02/2007 15:41:58  Job #:  [id number]   cc:   Gabriel Earing, M.D.  Fax: [phone]

## 2010-08-26 NOTE — Op Note (Signed)
NAME:  Scott Willis, Scott Willis NO.:  [id number]   MEDICAL RECORD NO.:  [id number]          PATIENT TYPE:  AMB   LOCATION:  [facility]                         FACILITY:  [facility]   PHYSICIAN:  Sigmund I. Patsi Sears, Scott WillisDATE OF BIRTH:  11-25-1940   DATE OF PROCEDURE:  08/11/2007  DATE OF DISCHARGE:                               OPERATIVE REPORT   PREOPERATIVE DIAGNOSIS:  Left ureteral stone.   POSTOPERATIVE DIAGNOSIS:  Left ureteral stone.   PROCEDURES:  1. Cystourethroscopy.  2. Urethral dilation.  3. Left retrograde pyelogram with interpretation.  4. Left ureteroscopic stone manipulation with laser lithotripsy.  5. Placement of 6 x 24 contoured double-J stent.   ANESTHESIA:  General/LMA.   INDICATIONS FOR PROCEDURE:  Scott Willis is a 70 year old white male with  history of nephrolithiasis.  He has had a symptomatic stone that was  initially located in his left proximal ureter and now has moved to his  left mid ureter and he has failed medical expulsive therapy.  He  continues to be symptomatic and likewise was scheduled for surgical  management.  Preoperatively, all risks, consequences and benefits were  explained to him and informed consent was obtained.   PROCEDURE IN DETAIL:  The patient is brought to the operating room,  placed in supine position.  He was correctly identified by his wristband  and appropriate time-out was taken.  General anesthesia was delivered  and IV antibiotics were administered.  Once adequately anesthetized, he  was placed in dorsal lithotomy position with great care taken to  minimize the risk of peripheral neuropathy or compartment syndrome.  His  perineum was prepped and draped sterilely.  We begun our procedure by  performing rigid cystourethroscopy with the 22-French rigid cystoscopic  sheath, 12 degrees lens, normal saline as our irrigant.  The anterior  urethra was normal.  He did have a concentric narrowing at the  membranous urethra  which would not safely allow passage of our scope.  Likewise, we removed the scope and subsequently dilated his urethra  serially with male Sissy Hoff sounds beginning at 14-French and going up  to 28-French.  After this, the scope passed easily through this  narrowing.  His prostatic urethra was normal.  Upon entry into the  bladder, clear amber urine was identified.  Pan cystoscopy demonstrated  no significant urothelial abnormalities.  Both ureteral orifices were  noted to be in their normal anatomic position effluxing clear urine.  No  stones were seen.  The left ureteral orifice was cannulated gently with  a 6-French open-ended catheter.  Left ventricular pyelogram was  performed which demonstrated a normal ureter in course and caliber that  is slightly narrowed in the middle but there was no stricture.  A mobile  filling defect consistent with stone was seen just proximal to this in  the mid ureter.  We then advanced a sensor tip guidewire through the  open-ended catheter and directed it into the left renal pelvis with  fluoroscopy.  We then removed the open-ended catheter as well as the  cystoscope.  We then performed semi-rigid ureteroscopy of the left  ureter.  Again the ureteral orifice was easily traversed as was the  distal ureter.  However, he was somewhat narrow through his middle  ureter but there was no focal abnormalities.  With the patient's and  gentle movements, we were able to pass the ureteroscope up to the level  of the stone.  We also employed a second guidewire to allow safe passage  of this scope to the level of the stone.  It appeared to be  approximately 4 mm and it was oriented in a way which would not allow  its passage.  Likewise, we then performed laser lithotripsy with a 275  nanometers laser fiber with our settings at 0.6 joules and 6 Hz.  The  stone fragmented quite nicely with minimal pulsations.  We then removed  the laser fiber,  used a nitinol basket  to remove the two larger stone  fragments atraumatically.  The remaining stone debris behind is 7 mm in  size.  We then removed the ureteroscope and back-loaded the cystoscope  over the remaining guidewire.  We then placed a 6 x 24 double-J contour  stent over the guidewire using direct vision and fluoroscopic guidance.  Once in the appropriate position,  the guidewire was removed leaving the  proximal curl in the left renal pelvis and the distal curl was noted to  be in the bladder.  His bladder was drained and this marked the end of  our procedure.  He was awoken.  LMA was removed.  He was taken to the  recovery room in stable condition.  There were no complications.  Dr.  Patsi Sears was present and participated in all aspects of the case.     ______________________________  Scott Willis, M.D.      Sigmund I. Patsi Sears, M.D.  Electronically Signed    DW/MEDQ  D:  08/11/2007  T:  08/11/2007  Job:  [id number]

## 2010-08-26 NOTE — Assessment & Plan Note (Signed)
Scott Willis is back regarding his persistent back and right groin pain.  He did not have good results with the trigger point injections.  He has  been wearing his brace, but seems to be more dependent on it recently.  He continues on the Cymbalta, Elavil, and Neurontin.  He uses MSIR 1 q.8  h p.r.n., but usually once or twice a day at most.  He rates his pain to  3-6/10.  Described as a sharp, burning, tingling, and aching.  Sleep is  poor at times due to pain, waking him up.  He also has pain at times due  to stabbing during the day.  It seems to be worse with prolonged sitting  and standing.  He tries to wear his lumbosacral orthosis while he works  for the most part.   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  He smokes 1 plus pack of cigarettes per day.  He  lives alone.   REVIEW OF SYSTEMS:  Notable for numbness and tingling in the leg as well  as intermittent diarrhea and constipation.  Overall weight gain.  Sugars  have been high as well at times.   PHYSICAL EXAMINATION:  VITAL SIGNS:  Blood pressure is 146/68, pulse is  97, respiratory rate 18, and sating 96% on room air.  GENERAL:  The patient is pleasant, alert, and oriented x3.  He just  stand with somewhat of a kyphotic posture.  He ambulates with slightly  wide-based gait.  Vision is a problem still with his safety and balance.  He continues to have pain on palpation over the lumbar paraspinals from  L4-L1 on the right.  Also, has pain more proximally in the lower  thoracic spine 2, although, seems to have less point tenderness overall.  Pain seems to be worse with extension more than flexion today.  Right  groin is tender with palpation in the area of the right testicle on the  anterior medial thigh.  Sensation is decreased in the distal limbs  particularly.  Strength, otherwise has been 5/5 with 1+ reflexes.   ASSESSMENT:  1. Post laminectomy syndrome with T12  compression fracture and likely      facet arthropathy of the lumbar spine.  2. Diabetic peripheral neuropathy.  3. Ill-defined right hip/groin pain.  The patient has a history of      kidney stones.  I do not believe his groin pain is related to the      back, however.  4. Diabetic peripheral neuropathy.  5. Macular degeneration.  6. Hypertension.  7. Tobacco abuse.   PLAN:  1. Continue Cymbalta, Elavil, and Neurontin as prescribed.  2. Flexeril for spasms.  3. MSIR 15 mg q.8 h. p.r.n. for breakthrough pain.  4. We will send the patient to Dr. Wynn Banker for  L3-L4, L4-L5, and L5-      S1 medial branch blocks.  5. Order an MRI of the pelvis and right thigh to assess for any soft      tissue problems or other sources of his ill-defined pain.  6. Testosterone supplementation.  7. The patient has scheduled urological follow up next month, which I      think is in order.  8. Wrote the prescription for replacement of back brace.  9. I will see the patient back pending injections with Dr. Wynn Banker.      Ranelle Oyster, M.D.  Electronically Signed  ZTS/MedQ  D:  05/09/2008 10:28:46  T:  05/10/2008 00:58:33  Job #:  [id number]   cc:   Lillia Carmel, M.D.  Fax: [phone]   Kerrin Champagne, M.D.  Fax: [phone]

## 2010-08-26 NOTE — Assessment & Plan Note (Signed)
Scott Willis is back regarding his back pain.  He had been doing well until  May, where he had a fall from a stool at work.  He saw Dr. Wynn Banker  back in followup and he repeated his medial branch blocks at L2 through  L5.  These were done on the right side.  His pain did not improve with  these injections.  He saw Dr. Loleta Chance to order x-rays of his back which  were unremarkable.  He has had problems with kidney stones and was  admitted to the hospital in June.  He sees Dr. Brunilda Payor for current  management.  Dr. Brunilda Payor stated that he prefer that he stay off the  morphine because he thought that was contributing to his stones.  The  patient rates his pain today at a 4/10.  Pain is sharp, constant, mostly  in the mid to upper back and into the right hip as well.  The pain is  worse in the daytime and evening.  Pain worsens with standing and  bending and sometimes sitting.  He does use his TENS unit as well as  heat and has not been back on his brace as yet.   REVIEW OF SYSTEMS:  Notable for bladder and bowel control problems,  spasms, high sugars, diarrhea, constipation.  Other pertinent positives  are above and full review is in the written health and history section  of the chart.  He had a EGD which was done on September 20, 2008 that was  notable for esophagitis and duodenitis with a duodenal ulcer.   SOCIAL HISTORY:  The patient is living alone.  He is still smoking a  pack of cigarettes per day.  He is working as Theme park manager.   PHYSICAL EXAMINATION:  VITAL SIGNS:  Blood pressure is 155/71, pulse is  85, respiratory rate 80, he is sating 98% on room air.  GENERAL:  The patient is pleasant, alert, and oriented x3.  He is a bit  anxious at times.  BACK:  Tender to touch from the mid thoracic to the lower lumbar spine  particularly along the right side and in the lower thoracic and upper  lumbar segments.  Pain was worse really with flexion rather than  extension today.  He had pain  with lateral bending to each side as well.  Strength is generally preserved to 5/5 with some decreased sensation  distally.  Vision remains poor.  Balance was fair today.  HEART:  Regular rate.  CHEST:  Clear.  ABDOMEN:  Soft, nontender.   ASSESSMENT:  1. Chronic low back pain/post laminectomy syndrome and lumbar facet      arthropathy.  2. Diabetic peripheral neuropathy.  3. Macular degeneration.  4. History of nephrolithiasis.  5. Gastrointestinal bleed.   PLAN:  1. We will stay away from the morphine for now.  We will try Ultram 50      mg q.6 h p.r.n. breakthrough symptoms.  2. I encouraged him to use his brace when up for long periods of time.      He needs to work on stretching and range of motion.  He continues      to employ other modalities as indicated.  3. We will send him for an MRI of the thoracic and lumbar spine to      rule out occult fracture and herniated disk.  4. I will see him back in approximately 2 months' time, but if not  sooner depending on his needs.      Ranelle Oyster, M.D.  Electronically Signed     ZTS/MedQ  D:  11/07/2008 13:28:35  T:  11/08/2008 04:10:39  Job #:  [id number]   cc:   Lillia Carmel, M.D.  Fax: [phone]   Kerrin Champagne, M.D.  Fax: [phone]

## 2010-08-26 NOTE — Discharge Summary (Signed)
NAME:  Scott Willis, Scott Willis NO.:  [id number]   MEDICAL RECORD NO.:  [id number]          PATIENT TYPE:  REC   LOCATION:  [facility]                          FACILITY:  [facility]   PHYSICIAN:  Scott Willis, M.D.DATE OF BIRTH:  03-04-1941   DATE OF ADMISSION:  09/12/2008  DATE OF DISCHARGE:  09/14/2008                               DISCHARGE SUMMARY   PRIMARY CARE PHYSICIAN:  Scott Willis, M.D.   UROLOGIST:  Scott Willis, M.D.   DISCHARGE DIAGNOSES:  1. Acute complicated pyelonephritis.      a.     Culture currently pending but preliminary results consistent       with coag-negative staph.  2. Obstructive nephrolithiasis with treatment discussed below.  3. Clinically much improved at time of discharge.  4. Obstructing nephrolithiasis.      a.     Status post double-J stent, Scott Willis.      b.     To follow up with Scott Willis in the outpatient setting for       extracorporeal shockwave lithotripsy to right ureteral stone as       outpatient.  5. Acute renal failure.      a.     Secondary to obstruction/hydronephrosis and pyelonephrosis.      b.     Renal function normalized at discharge.  6. Diabetes mellitus.  7. Hypertension.  8. Hypercholesterolemia.  9. Macular degeneration.  10.Transient hypokalemia - resolved.  11.Transient hyponatremia - resolved.  12.Chronic low back pain.  13.Status post back surgery x2.  14.Status post appendectomy.  15.Status post kidney stone extraction April 2009.  16.Questionable urticarial reaction to Zosyn - recommended caution      with future use.   DISCHARGE MEDICATIONS:  1. Metformin 850 mg p.o. t.i.d.  2. Neurontin 600 mg p.o. t.i.d.  3. Cymbalta 60 mg p.o. daily.  4. Vitamin D 50,000 units once a week.  5. Aspirin 81 mg daily.  6. Testosterone injections IM per specialized dose used prior to      admission.  7. Clarithromycin 500 mg p.o. b.i.d. for 14 days and then stop.  8. Clindamycin 300 mg t.i.d. for 14 days  and then stop.   FOLLOW UP:  1. The patient is call Scott Willis office at [phone] to arrange      outpatient followup.  Scott Willis will be anticipating this call.      Ultimately the patient will need treatment for his retained right      ureter stone.  2. The patient is to follow up with his primary care physician, Dr.      Prince Willis, in 5-7 days.  At that time, formal results from the      patient's urine culture should be available.  It is recommended      that these results be assessed and the sensitivity of the patient's      offending bacterial pathogen be reconciled with the patient's      antibiotic therapy.   CONSULTATIONS:  Scott Willis with Urology.   PROCEDURES:  1. CT scan of the abdomen and pelvis  Sep 10, 2008.  An 11-mm proximal      right ureteral calculus producing moderate right      hydroureteronephrosis with bilateral nephrolithiasis appreciable.      No acute intrapelvic findings.  Remote T12 compression fracture.  2. Cysto with right retrograde pyelogram and double-J stent deployment      Sep 10, 2008 - Scott Willis.   HOSPITAL COURSE:  [name] is a pleasant 70 year old  gentleman with a known prior history of kidney stones.  He presented to  the hospital on Sep 10, 2008 with complaints of severe right-sided pain.  This was primarily located in the flank.  This was not to be confused  with his chronic low back pain.  Evaluation in the emergency room  revealed a urinalysis consistent with a pyelonephritis.  CT scan of the  abdomen and pelvis was accomplished, given the patient's history of  nephrolithiasis.  This revealed not only a right ureteral stone but also  evidence of hydronephrosis with obstruction.  As a result, the patient  was classified as having a complicated acute pyelonephritis due to  obstructing ureteral stone.  Additionally, the patient was noted to be  in acute renal failure with significant elevation of his creatinine to   approximately 1.8 at the time of his admission.  A baseline was  previously established at 1.1.  The patient received IV fluid hydration.  The patient received empiric IV antibiotics.  Urology was consulted.  Scott Willis responded quickly to the patient's care.  A cysto was  carried out on Sep 10, 2008 with a retrograde pyelogram and placement of  a double-J stent under general anesthesia.  The patient tolerated this  procedure well.  The patient's postoperative course was marked by  continued improvement in his renal function with hydration.  Urine  culture had been sent at time of his admission.  At the time of this  dictation, the urine cultures reveals greater than 100,000 colonies per  mL, but a speciation is not yet available.  I called the lab directly  myself and was informed that the preliminary findings are consistent  with a coag-negative staph.  At this point, however, we do not have  specific sensitivities or other data available.  Initially the patient  was being treated with gentamicin and cefepime.  Decision was made to  transition this to Zosyn in the setting of acute renal insufficiency as  well as persistent low-grade fevers.  The patient initially tolerated  Zosyn without difficulty.  His nausea and vomiting improved  significantly.  He suffered no further severe fevers.  His white count  decreased from 13 to 10.   On the last day of the patient's hospital stay, however, he did develop  small urticaria about the lateral and posterior neck.  These were  treated with a dose of IV Benadryl and Pepcid.  There was no evidence of  stridor or thickening of the tongue.  There was no angioedema  whatsoever.  There was no difficulty with shortness of breath.  It is  felt that this is possibly a minimal reaction to the Zosyn that was  administered.  No other new medications were administered around the  time of the patient's urticaria.  This cannot definitively be stated, as   any number of things could have led to the patient's urticaria.  Nonetheless, further use of penicillin or penicillin-related drugs  should be carried out with caution in the future.  Despite his urticaria, by September 14, 2008 the patient was otherwise doing  quite well.  His renal function had improved, with a creatinine of 1.3.  Sodium was normal at 139 with a potassium that was normal at 4.4.  White  count had decreased to 10.9.  The patient was able to tolerated regular  food and was afebrile with stable vital signs.  Given a lack of specific  information regarding the patient's urine culture other than a possible  preliminary identification of coag-negative staph, broad antibiotic  coverage was felt to be most appropriate in the outpatient setting.  Given the severity of the patient's recent illness, a prolonged course  was felt to be appropriate.  A review of literature suggests that coag-  negative staph often displays multi-drug resistant patterns.  Success  has been achieved using clindamycin or macrolides in the outpatient  setting.  To assure that the patient is treated completely and  appropriately, I have decided to treat the patient with both clindamycin  and clarithromycin at his discharge.  These will be administered for a  14-day course.  As noted above, when the patient is seen in followup by  his primary care physician, it is recommended that his finalized culture  results be followed and adjustments in further antibiotic therapy made  as indicated.   At the present time, we are simply monitoring the patient, as he is  somewhat drowsy from the Benadryl that was administered approximately 30  minutes ago.  As soon as this lifts and as long as the patient proves to  be stable otherwise, he will cleared for discharge this afternoon.      Scott Willis, M.D.  Electronically Signed     JTM/MEDQ  D:  09/14/2008  T:  09/14/2008  Job:  [id number]   cc:   Scott Willis, M.D.  Fax: [phone]   Scott Willis, M.D.  Fax: [phone]

## 2010-08-26 NOTE — Assessment & Plan Note (Signed)
Scott Willis is back regarding his back pain.  He is having more back and  right leg symptoms over the last month or so.  He does not like using  his MS Contin a whole lot at night because it gives him nightmares.  Pain seems to radiate around the left thigh and to the knee area more  laterally than anteriorly.  He has occasional whirling pain as well  there which we examined at last visit.  He saw Dr. Megan Mans, who felt he  did not have a hernia.  He uses Neurontin and Elavil still at 25 mg  nightly and 300 mg t.i.d. dosing.  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  Pain is rated at 3/10, described as sharp,  burning, constant, tingling, and aching.  Pain interferes with general  activity, relations with others, and enjoyment of life on a mild level.  Sleep is poor.   REVIEW OF SYSTEMS:  Notable for the above as well as bowel control  problems, occasional constipation, abdominal pain, and weight loss.  He  still has been having ongoing issues with his eyes and seeking surgery.   SOCIAL HISTORY:  The patient is divorced, lives alone.  He is smoking 1  plus pack of cigarettes per day.   PHYSICAL EXAMINATION:  VITAL SIGNS:  Blood pressure is 154/77, pulse is  86, respiratory rate 18, and he is sating 97% on room air.  GENERAL:  The patient is pleasant, alert, and oriented x3.  MUSCULOSKELETAL:  His back is tender to palpation over the lower lumbar  spine segments particularly at the PSIS area and left L5-S1 junction.  Strength is 5/5.  He has slight pain with straight leg raising and calf  tightness appreciated.  Hip rotation did not cause any pain in the  pelvis or back.  Sensory exam remains decreased distally and stocking-  glove type distribution.  Posture is fair.  He has a bit of antalgia  over the right leg.   ASSESSMENT:  1. Chronic low back pain related to lumbar spondylosis, compression      fracture, post-laminectomy syndrome, and facet arthropathy as well    as potential radiculitis.  2. History of bilateral ulnar nerve and median nerve compression.  3. Peripheral neuropathy.  4. Diabetes.  5. Hypertension.  6. History of right intestinal surgery and inguinal pain.   PLAN:  1. We will increase Neurontin ultimately up to 600 mg t.i.d. over the      next 3 weeks' time.  2. Increase Elavil to 50 mg nightly.  3. Continue MS Contin 30 mg q.12 h. for baseline pain control.  I gave      him MS Contin 15 mg immediate release q.12 h. p.r.n. for      breakthrough symptoms.  4. We will send the patient for an MRI of the lumbar spine without      contrast to assess for degenerative disk disease and nerve root      involvement particularly in the L3-L4 segments, which are      concerning today on exam.  5. We reviewed the medication schedule.  Effects and side effects of      the medication changes were made today.  I will see him back in      about a month or so to discuss plan, if not sooner.      Ranelle Oyster, M.D.  Electronically Signed     ZTS/MedQ  D:  01/11/2008  16:10:96  T:  01/12/2008 05:48:07  Job #:  [id number]   cc:   Lillia Carmel, M.D.  Fax: [phone]

## 2010-08-26 NOTE — Discharge Summary (Signed)
NAME:  Scott Willis, Scott Willis NO.:  [id number]   MEDICAL RECORD NO.:  [id number]          PATIENT TYPE:  INP   LOCATION:  2031                         FACILITY:  [facility]   PHYSICIAN:  Beckey Rutter, MD  DATE OF BIRTH:  1940-12-01   DATE OF ADMISSION:  02/21/2008  DATE OF DISCHARGE:  02/23/2008                               DISCHARGE SUMMARY   PRIMARY CARE PHYSICIAN:  Lillia Carmel, MD   CHIEF COMPLAINT:  Weakness and unsteady gait.   HOSPITAL COURSE:  Scott Willis is 70 years old, pleasant Caucasian male  with multiple medical problems admitted because of unsteady gait and  some change on mentation.   HOSPITAL COURSE:  During the hospital stay, the patient's narcotic  medication was held for 24 hours.  The oxycodone and the morphine was  introduced slowly after the patient regained consciousness.  Today, the  patient is to his baseline.  He seems very intelligent, and he is very  cognizant of his doses and his surroundings.  He owned a [employer].  The patient is back to his baseline, and he wanted to be released.  The  patient medically is stable for discharge today.  He was seen by  physical therapy during the session.  He was not very cooperative with  physical therapy.  The patient evidently received overdose of his  narcotics, morphine and oxycodone, with the intention to elevate the  pain.  The patient denied any suicidal ideation.   During the hospital course and since yesterday, the patient is very  lucid, but he started to request pain medication.  I had a thorough  discussion with him for more than 40 minutes in regard to counseling and  the way the medication works.  I explained to him the need to cut back  on his MS Contin and MSIR to the degree to elevate the pain, but not to  induce overdose and change his mentation.  This discussion has taken  place yesterday and today as well prior to his discharge.   The rest of his medical problems remained  stable during the hospital  stay.  Please note, the change in his medication as below.   DISCHARGE DIAGNOSES:  1. Altered mental status secondary to morphine and oxycodone overdose.  2. Diabetes.  3. Chronic back pain.  4. Mild normocytic anemia.  5. Back surgery in 1998, currently with intractable back pain,      chronic.  6. Status post partial hemicolectomy in July 2009.  7. Hypertension.  8. Dyslipidemia.  9. Osteoporosis.  10.History of kidney stones.  11.Chronic back pain and chronic narcotic therapy as discussed above.   DISCHARGE MEDICATIONS:  1. Super complex vitamin C daily.  2. Gabapentin 600 mg 3 times a day.  3. Aspirin 81 mg daily.  4. Amitriptyline 50 mg at bedtime.  5. Bactrim double strength half tab daily.  6. Cyclobenzaprine 10 mg every 8 hours as needed.  7. Metformin 850 mg three times a day  8. Morphine IR 15 mg every 6 hours p.r.n.  9. MS Contin 15 mg every 12 hours.  HOSPITAL PROCEDURES:  1. Radiology, the patient had CT head without contrast on the day of      admission on February 21, 2008.  Impression was showing no acute      intracranial abnormalities.  2. On February 21, 2008, the patient had x-ray showing chronic lung      disease, no active cardiopulmonary disease.  3. On February 22, 2008, the patient had renal ultrasounds.  The      impression was showing:      a.     No hydronephrosis.      b.     Small nonobstructing lower pole renal calculi bilaterally.  4. MRI and MRA was done on February 22, 2008.  Impression was showing      for the MRI:      a.     No evidence of acute ischemia.      b.     Nonspecific subcortical white matter change.  The       differential consideration would be area of ischemic gliosis       related to small vessel disease due to hypertension and/or       diabetes with less likely possibility of demyelinating illness or       vasculitis.      c.     Mild inflammatory thickening of the mucosa in the ethmoids        and frontal sinuses, with probable mucus retention in the right       maxillary sinus.  The MRA was showing no delusions, stenosis,       dissection, or aneurysm seen on the image provided.   His BNP today showing sodium 140, potassium 3.7, bicarb 105, chloride  105, bicarb 26, glucose 79, BUN 16, creatinine 1.17.   The patient came with acute renal failure picture with improvement of  his creatinine to baseline today as mentioned here with hydration.  The  renal sonogram was essentially negative as discussed above.   The patient is aware and agreeable to discharge plan.      Beckey Rutter, MD  Electronically Signed     EME/MEDQ  D:  02/23/2008  T:  02/24/2008  Job:  [id number]   cc:   Lillia Carmel, M.D.  Ranelle Oyster, M.D.

## 2010-08-26 NOTE — Assessment & Plan Note (Signed)
Scott Willis is back regarding his multiple pain complaints.  He had a fall  a few weeks back where he injured his left eye.  He is currently seeing  ophthalmology for some bleeding and the fractures of his orbit.  Pain is  improving there somewhat.  His left back is doing fairly well.  He is  still having some right back pain.  We switched him to MS Contin for  insurance purposes, he says this is working fairly well.  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  Currently his pain rates a 5/10.  He finds the  Lidoderm patches as well as the TENS unit helpful for the pain.  Sleep  is poor at times although Tofranil helps pain and sleep in general.  Cymbalta is working for mood.  The patient has had surgery to free his  right median and ulnar nerves at the wrist and elbow respectively by Dr.  Otelia Sergeant and is currently undergoing rehab.   REVIEW OF SYSTEMS:  Notable for the above.  Full review is in the health  and history section of the chart.   SOCIAL HISTORY:  Without significant change.  He continues to work  despite his injuries.   PHYSICAL EXAMINATION:  Blood pressure is 134/70, pulse is 88,  respiratory rate 16, he is sating 97% room air.  Patient generally  pleasant.  Alert and oriented x3.  He has some residual bruising around  the eye but skin has healed.  Vision is unchanged.  Overall his vision  is still poor.  He has some tenderness in the right lower paraspinals  and facets.  He is standing with somewhat of a leftward bend but posture  overall is improved.  Gait is stable.  Motor function is 5/5.  Sensory  exam is intact.  Did have some old bruising and healing wounds on the  left knee.  Patient continues to have an intrinsic-minus hand with  radicular involvement of the right ulnar nerve muscles with wasting of  the hypothenar eminence.  Median nerve function appears to be improving.  HEART:  Regular rhythm with  tachycardia.  CHEST:  Clear.  ABDOMEN:  Soft, nontender.   ASSESSMENT:  1. Chronic low back pain related to multifactorial issues including      lumbar spondylosis, compression fracture, post laminectomy      syndrome, and facet arthropathy.  2. Bilateral ulnar nerve and median nerve compression, status post      surgical decompression.  3. History of cancerous polyps.  4. History of likely centralized pain.   PLAN:  1. Continue MS Contin 15 mg one q. 8 hours p.r.n.  2. Continue Tofranil, Cymbalta, and Lidoderm patches.  3. Continue TENS unit for local back pain.  4. I would like to see the patient working on core muscle      strengthening and stretching once again after his hand is improved.      However, I am afraid he will have permanent ulnar nerve damage      judging by the findings on his      examination today.  5. I will see the patient back in about 3 month's time.  Will see the      nurse clinic in one month.      Ranelle Oyster, M.D.  Electronically Signed     ZTS/MedQ  D:  10/08/2006 14:11:05  T:  10/08/2006 16:32:25  Job #:  [id number]   cc:   Scott Willis, M.D.  Fax: [phone]

## 2010-08-26 NOTE — Assessment & Plan Note (Signed)
FOLLOW UP OFFICE NOTE:  Scott Willis is back regarding his back pain and  neuropathy. His right hand is doing a bit better after surgery although  he is still having a lot of pain in the fingers and feet at night. The  pain keeps him awake. He is using less MS Contin overall and feels that  he is more alert and bowels have improved. He notes that the Tofranil is  not helping him a great deal at night for sleep. He had been using  Lidoderm patches until he hit the donut hole in his insurance policy.  Cymbalta has also been stopped for that reason. He is using his Tens  more often than anything else for back pain. The patient continues to  work full time with his [employer]. The pain is described as  constant, tingling and burning. The pain is more severe in the chest and  right low back as well as the extremities.   REVIEW OF SYSTEMS:  Notable for the above.   SOCIAL HISTORY:  As noted above. He continues to work and smoke.   PHYSICAL EXAM:  Blood pressure is 147/74, pulse is 93, respiratory rate  18, he is sating at 98% on room air. The patient is pleasant, well  oriented x3. Affect is bright and appropriate. HEART: Regular rate and  rhythm. LUNGS: Clear. ABDOMEN: Soft and nontender. He has better hand  grasps bilaterally although he still has weakness in his intrinsic  muscles of the hands, right greater than left today. Okay test was  mildly positive. Furman's test was extremely positive today. Low back  was generally stable with range of motion, flexion, extension and  rotation today. He was more alert. Vision was generally stable.   ASSESSMENT:  1. Chronic low back pain related to lumbar spondylosis and compression      fracture, post laminectomy syndrome and facet arthropathy.  2. Bilateral ulnar and medial nerve compression, status post surgical      decompression on the right.  3. History of cancerous polyps.  4. History of peripheral neuropathy.   PLAN:  1. MS Contin  p.r.n. for break through pain which is using less of.  2. To continue Tofranil. We will add Neurontin, titrating up to 200 mg      HS, 100 mg b.i.d. for three weeks time.  3. Continue Tens unit for localized back pain.  4. I will see the patient back in about two months. He looks good      today.      Ranelle Oyster, M.D.  Electronically Signed     ZTS/MedQ  D:  01/04/2007 11:54:41  T:  01/04/2007 14:36:18  Job #:  [id number]   cc:   Gabriel Earing, M.D.  Fax: [phone]

## 2010-08-26 NOTE — Op Note (Signed)
NAME:  Scott Willis, Scott Willis NO.:  [id number]   MEDICAL RECORD NO.:  [id number]          PATIENT TYPE:  INP   LOCATION:  0108                         FACILITY:  [facility]   PHYSICIAN:  Lindaann Slough, M.D.  DATE OF BIRTH:  09-10-1940   DATE OF PROCEDURE:  DATE OF DISCHARGE:                               OPERATIVE REPORT   PREOPERATIVE DIAGNOSES:  Right hydronephrosis, bilateral renal stones,  right ureteral stone, urinary tract infection, and urosepsis.   PROCEDURE:  Cystoscopy, right retrograde pyelogram with interpretation,  and insertion of right double-J catheter.   ANESTHESIA:  General.   INDICATIONS FOR PROCEDURE:  The patient is a 70 year old male who has  been complaining of back and right flank pain for the past 10 days.  The  pain became severe today, and he was seen in the emergency room where he  had a temperature of 102.4.  He has a past history of kidney stones.  CT  scan showed right hydronephrosis, bilateral perinephric stranding, right  hydronephrosis, and an 11-mm proximal ureteral stone.  He is scheduled  for cystoscopy, right retrograde pyelogram, and insertion of double-J  catheter.  The procedure, risks, and benefits were discussed with the  patient.  He understands and gave informed consent.   DESCRIPTION OF PROCEDURE:  The patient was identified by his wrist arm  band, and proper time-out was taken.   Under general anesthesia, he was prepped and draped and placed in the  dorsal lithotomy position.  A panendoscope was inserted without  difficulty in the bladder.  The anterior urethra is normal.  He has  moderate prostatic hypertrophy.  The bladder mucosa is normal.  There is  no stone or tumor in the bladder.  The ureteral orifices are in normal  position and shape.   Retrograde pyelogram:  A sensor-tip guidewire was passed through an open-  ended catheter and the open-ended catheter and sensor guidewire were  passed through the cystoscope  into the right ureteral orifice.  The  sensor wire was advanced in the ureter, and the open-ended catheter was  advanced over the guidewire into the distal ureter.  The sensor tip  guidewire was removed.  Contrast was then injected through the open-  ended catheter.  The distal and mid ureter appear normal.  There is a  large filling defect in the proximal ureter with some smaller filling  defect proximal to the ureteral stone.  The renal pelvis and calices are  moderately dilated.   The sensor tip guidewire was then passed through the open-ended catheter  into the renal pelvis, and the open-ended catheter was removed.  A #6-  [place] - 26 double-J catheter was then passed over the guidewire.  The  proximal curl  of the double-J catheter is in the renal pelvis.  The distal curl is in  the bladder.  The bladder was then emptied, and the cystoscope and the  guidewire were removed.   The patient tolerated the procedure well and left the OR in satisfactory  condition to post anesthesia care unit.      Lindaann Slough, M.D.  Electronically Signed  MN/MEDQ  D:  09/10/2008  T:  09/10/2008  Job:  [id number]

## 2010-08-26 NOTE — Procedures (Signed)
NAME:  Scott Willis, Scott Willis NO.:  [id number]   MEDICAL RECORD NO.:  [id number]          PATIENT TYPE:  REC   LOCATION:  TPC                          FACILITY:  [facility]   PHYSICIAN:  Erick Colace, M.D.DATE OF BIRTH:  [birth year]   DATE OF PROCEDURE:  05/28/2008  DATE OF DISCHARGE:                               OPERATIVE REPORT   PROCEDURE:  Right L5 dorsal ramus injection, right L4 medial branch  block, right L3 medial branch block, right L2 medial branch block under  fluoroscopic guidance.   INDICATIONS:  Left side lumbar pain both in the mid and lower areas.  Pain is interfering with sitting and it is a moderate level and only  partially responsive to medication management including narcotic  analgesic medications.   Informed consent was obtained after describing risks and benefits of the  procedure to the patient.  These include bleeding, bruising, infection.  He elects to proceed.  He denies any allergy to the injectable  materials.  He denies any blood thinner use or antibiotic use.   The patient was placed prone on fluoroscopy table.  Betadine prep,  sterile drape 25-gauge inch and half needle was used to anesthetize skin  and subcu tissue 1% lidocaine x2 mL, then a 22-gauze 3-1/2 spinal needle  was inserted under fluoroscopic guidance first starting right S1 SAP  sacral ala junction, bone contact made.  Omnipaque 180 under live fluoro  demonstrated no intravascular uptake then 0.5 mL of solution containing  1 mL of 4 mg/mL dexamethasone and 2 mL of 2% MPF lidocaine were  injected.  Then, the right L5 SAP transverse process junction targeted,  bone contact made.  Omnipaque 20 live fluoro demonstrated no  intravascular uptake then 0.5 mL of dexamethasone lidocaine solution was  injected.  Then, the right L4 SAP transverse junction targeted, bone  contact made.  Omnipaque 180 under live fluoro demonstrated no  intravascular uptake 10.5 mL of dexamethasone  lidocaine solution  injected.  Then, the right L3 SAP transverse process junction targeted,  bone contact made.  Omnipaque 180 under live fluoro demonstrated no  chest intravascular uptake then 0.5 mL of dexamethasone lidocaine  solution was injected.  The patient tolerated the procedure well.  Pre  and post injection vitals stable.  Post injection instructions given.      Erick Colace, M.D.  Electronically Signed     AEK/MEDQ  D:  05/28/2008 12:26:17  T:  05/29/2008 01:47:40  Job:  [id number]

## 2010-08-26 NOTE — Assessment & Plan Note (Signed)
Mr. Moan returns today.  He had a right L5 dorsal ramus injection,  right L4 medial branch, right L3 medial branch block, and right L2  medial branch block under fluoroscopic guidance last month.  He did well  with the injection, had a bout of 50% relief of pain, and he thinks his  pain has not gotten back to his preinjection levels yet.  He is  averaging around 3.  He will have about one time per day where  temporarily goes up to 7 or 8.  He still has some sleep problems related  to pain, but the injection was not helpful for that either.   He is employed 60-70 hours a week.   REVIEW OF SYSTEMS:  Numbness and tingling, right lower extremity.   PHYSICAL EXAMINATION:  GENERAL:  No acute distress.  Mood and affect  appropriate.  VITAL SIGNS:  Blood pressure 171/86, pulse 69, respirations 18, O2 sat  99% on room air.   IMPRESSION:  Lumbar facet mediated pain, right side, improved, prolonged  effect after medial branch blocks.  We will hold off on further  injections today.  He will follow up with Dr. Riley Kill in about a month.  I will see him back if this pain recurs on his low back area.      Erick Colace, M.D.  Electronically Signed     AEK/MedQ  D:  06/25/2008 10:18:42  T:  06/26/2008 00:09:18  Job #:  [id number]   cc:   Kerrin Champagne, M.D.  Fax: [phone]

## 2010-08-26 NOTE — H&P (Signed)
NAME:  JASH, WAHLEN NO.:  [id number]   MEDICAL RECORD NO.:  [id number]          PATIENT TYPE:  INP   LOCATION:  1829                         FACILITY:  [facility]   PHYSICIAN:  Ladell Pier, M.D.   DATE OF BIRTH:  Jan 31, 1941   DATE OF ADMISSION:  02/21/2008  DATE OF DISCHARGE:                              HISTORY & PHYSICAL   CHIEF COMPLAINT:  The patient was at physical therapist day and was  noted to have right-sided weakness and unsteady gait.   HISTORY OF PRESENT ILLNESS:  The patient is a 70 year old white male  with past medical history significant for multiple medical problems  including chronic back pain on chronic narcotic therapy.  He also has  diabetes, hypertension, dyslipidemia.  The patient was at rehab today  and as per  physical therapy was noted to have unsteady gait and to be  weak on the right side and had some slurred speech at rehab as well  today.  He was sent over to the hospital for further evaluation.  The  patient is very somnolent and unable to give history.  He wakes up to  sternal rub, will follow some commands, but is very somnolent.  Per  talking to his daughter, he was doing fine.  He is on a lot of pain  medications, but when she saw him a few days ago he was doing fine per  the caretaker.  They noted that his speech was slurred as well and that  he was having problems remembering her name at home.  He did not  complain of any chest pain or any shortness of breath or any headaches.   PAST MEDICAL HISTORY:  Significant for:  1. Diabetes.  2. Mild normocytic anemia.  3. Macular degeneration.  4. Back surgery in 1998.  5. Status post partial hemicolectomy in July 2007.  6. Hypertension.  7. Dyslipidemia.  8. Osteoporosis.  9. History of kidney stones.  10.Chronic back pain on chronic narcotic therapy.   FAMILY HISTORY:  Noncontributory.   SOCIAL HISTORY:  The patient is divorced, lives alone.  He smokes  heavily.  Daughter  is unsure of how much he smokes per day.  She thinks  he smokes about two packs per day.  No alcohol use.  He has one child.  He owns his own business.   \MEDICATIONS:  1. Super complex vitamin C daily.  2. Gabapentin 600 mg 3 times daily.  3. Aspirin 81 mg daily.  4. Amitriptyline 50 mg at bedtime.  5. Bactrim double strength half daily.  6. Cyclobenzaprine 10 mg every 8 hours as needed.  7. Metformin 850 mg 3 times daily.  8. Morphine IR 15 mg every 12 hours as needed.  9. Morphine 30 mg every 12 hours.   ALLERGIES:  HE IS ALLERGIC TO FLUORESCEIN DYE TO THE EYES ONLY, TAPE AND  BEE STING.   REVIEW OF SYSTEMS:  Unable to obtain secondary to the patient's mental  status.   PHYSICAL EXAM:  Temperature 97.8, blood pressure 127/68, pulse 84,  respirations 18, pulse ox 96%.  GENERAL: The patient is  lying in bed.  Does not seem to be in any acute  distress.  He is arousable to sternal rub.  He will open his eyes,  follows some commands, but then  he goes right back to sleep.  HEENT:  Head is normocephalic, atraumatic.  Pupils are reactive to  light.  His throat was without erythema.  CARDIOVASCULAR:  Regular rate and rhythm.  LUNGS: Clear bilaterally.  ABDOMEN:  Positive bowel sounds.  Soft, nontender.  EXTREMITIES: Without edema.  He does not follow with the extraocular  movement.  He does not follow the exam.  He is able to lift the left  lower extremity.  He does not follow commands to try to lift the right.  He will lift his left arm up off the stretcher.  He will move the right  around but does not lift it from the stretcher.  Not sure if  this is  secondary to narcotics or if this is indeed a stroke.   LABORATORIES:  WBC 9.6, hemoglobin 12.7, MCV 94.9, platelets 204.  Sodium 136, potassium 6.9, chloride 105, CO2 of 24, glucose 82, BUN 38,  creatinine 3.57, calcium 10.6, glucose 183, potassium 6.  UA:  Trace  leukocyte esterase, 0-2 WBCs, 0-2 RBCs.  No acute intracranial   abnormality of the head CT.  Chest x-ray shows chronic lung disease, no  active cardiopulmonary process.   ASSESSMENT/PLAN:  1. Altered mental status.  2. Hyperkalemia.  3. Diabetes.  4. Acute renal failure.  5. Right-sided weakness at physical therapy today.  6. Mild normocytic anemia.  7. Hypertension.  8. Dyslipidemia.  9. Chronic back pain.  10.Mild hypercalcemia.   We will admit the patient to the hospital, start him on intravenous  fluids.  We will hold his narcotics and his tricyclic antidepressants  for now and we will check an MRI, MRA, a two dimensional echocardiogram,  carotid Dopplers.  Check renal ultrasound.  For his potassium we will  give him D50 insulin and Kayexalate and recheck his potassium level  later on tonight.  We will check an electrocardiogram as well and  hemoglobin A1c.  We will cycle cardiac enzymes as well and also order  some laboratories for in the morning.  We will do a bedside swallow  evaluation.  If the patient passes a bedside swallow evaluation, we  will start him on a diet when he is more awake.  We will get physical  therapy consult ,occupational therapy and speech therapy with swallow  evaluation consult as well.     Ladell Pier, M.D.  Electronically Signed    NJ/MEDQ  D:  02/21/2008  T:  02/21/2008  Job:  [id number]

## 2010-08-26 NOTE — Assessment & Plan Note (Signed)
Scott Willis is here in followup of his chronic back and right leg pain.  We had an MRI done last month which revealed facet arthropathy at L4  through S1 as well as some disk bulging and general mild stenosis.  No  nerve root encroachment was seen.  The patient has followed up with Dr.  Prince Rome regarding his symptoms as well.  Testosterone was refilled and  rechecked.  Dr. Prince Rome thought that he may have bursitis versus nerve  impingement versus neuropathy.  The patient continues to describe back  pain as well as pain in the hip, thigh, and groin areas.  It seems to be  worse when he sits.  It does not bother him as much when he stands.  X-  ray of the hip was done which revealed only mild joint space narrowing.  He continues to work.  He is using MS Contin scheduled as written 30 mg  CR q.12 h. with the MS Contin 15 mg immediate release one q.12 h. p.r.n.  His sleep is poor due to his pain.  He described his pain as sharp,  burning, stabbing, tingling, aching constant.   REVIEW OF SYSTEMS:  The patient reports the above as well as spasm,  numbness, tingling, weight loss, constipation, abdominal pain.  Full  review is in the written health and history section in the chart.   SOCIAL HISTORY:  The patient continues to work 60-70 hours a week,  running a [employer].  He continues to smoke heavily at 1+ packs  per day.   PHYSICAL EXAMINATION:  VITAL SIGNS:  Blood pressure is 109/71, pulse 89,  and respiratory rate 18.  He is sating 98% on room air.  GENERAL:  The patient is pleasant, alert, and oriented x3.  Affect is  bright and appropriate.  He has a hard time staying still and seems to  be pacing in the room.  He sits briefly on the table and has some pain  with seated slump test.  MUSCULOSKELETAL:  He is very tight in his calves, hamstring, as well as  his iliopsoas muscles.  Lumbar flexion did not seem to cause dramatic  pain.  He had mild pain with extension.  He had pain around  palpation of  the gluteals and the piriformis area.  He was not tender on the greater  trochanter region.  Luisa Hart test was negative.  He had only mild groin  tightness with hip range of motion in all planes.  Straight leg testing  only revealed hamstring and calf tightness today.  Frankly, he is near a  state of contracture of both knees due to his hamstring tightness.  He  could raise his knee at -5 to full extension on either side.  Strength  is generally 5/5 with reflexes 1+ at both knees and ankles.  Sensation  is decreased distally to light touch.   ASSESSMENT:  1. Persistent low back pain which is likely related to his      spondylosis/facet disease.  He also has a history of compression      fractures and osteoporosis in the setting of postlaminectomy      syndrome.  2. Bilateral ulnar nerve and median nerve compression of the upper      extremities.  3. Peripheral neuropathy.  4. Diabetes.  5. Hypertension.  6. Tobacco abuse.  7. Persistent ill-defined right hip and leg pain which does not travel      past the knee.  I think  his lower extremity musculature is      extremely tight and contributing to some of these symptoms.  Hips      are negative for any obvious source of this pain.   PLAN:  1. I agree with physical therapy.  We will send him to an outpatient      PT to aggressively work on range of motion, stretching, and      strengthening of the lower extremity limbs, pelvis, back etc.  2. We will start him on muscle relaxant, Flexeril 10 mg q.8 h. p.r.n.  3. He may stay with MS Contin 30 mg q.12 h. for baseline pain control      with MS Contin IR 15 mg 1 q.12 h. p.r.n., #60.  4. Testosterone supplementation and followup with Dr. Prince Rome.  5. Continue Elavil and Neurontin as written.  6. We will follow up the patient in 1 month's time.  Could consider      facet blocks and radiofrequency ablation      depending on his course.  I did explain to him that if we pursued       facet blocks that these would have no effect on any of his right      leg pain.      Ranelle Oyster, M.D.  Electronically Signed     ZTS/MedQ  D:  02/07/2008 11:25:22  T:  02/07/2008 23:59:50  Job #:  [id number]   cc:   Lillia Carmel, M.D.  Fax: [phone]

## 2010-08-26 NOTE — Assessment & Plan Note (Signed)
Mr. Elting is back regarding his chronic pain issues.  He is doing  fairly well with the Neurontin over the last few months.  He is taking  300 mg t.i.d. and it has helped some of the dysesthesias in his arm,  although he still has the numbness.  He complains of some weakness still  in the hand.  He is recovered from his fall and bruising there.  He was  seen by ophthalmology.  We treated him conservatively for the fracture.  Sleep is poor, and he is off the Tofranil now due to costs.  He uses MS  Contin 15 mg usually twice a day.  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  He likes the back brace, and he is weaning himself off it a  bit as his back, in general, has been better.  He likes Lidoderm  patches, but was unable to get these for a while due to hitting the  donut hole in his insurance policy.   Review of systems notable for the above as well as occasional  constipation, abdominal pain, high sugars.  He has had an overall weight  loss over the last few months' time.   SOCIAL HISTORY:  The patient lives alone, divorced.  Still smoking at  least 1 pack of cigarettes per day.   PHYSICAL EXAMINATION:  Blood pressure is 190/98, pulse is 82,  respiratory rate 18, he is satting 99% on room air.  The patient is generally pleasant, alert and oriented x3.  Affect is  bright and appropriate.  He has his bifocals today and seems to be seeing a bit better, although  needs particular lenses to see far and near.  He has some weakness, still, in the ulnar muscles of the right hand and  some ulnar wasting noted.  He has decreased sensation over this  distribution, more so than the median.  Posture is fair.  He stands with  more extension in the lumbar spine.  Balance appears to be good.  He is  alert and oriented x3.  The patient walks without any deficits.  Strength is generally 5/5 in all 4 extremities except for the right hand  and arm.  Reflexes are 2+ throughout.   ASSESSMENT:  1.  Chronic low back pain related to the lumbar spondylosis with      compression fracture and postlaminectomy syndrome with facet      arthropathy.  2. Bilateral ulnar and median nerve compression, more severe on the      right than left.  3. History of cancerous polyps.  4. Peripheral neuropathy.  5. Diabetes.  6. Tobacco abuse.  7. Hypertension.   PLAN:  1. Continue Neurontin 300 mg t.i.d.  2. Will start Elavil 25 mg nightly for dysesthesias and sleep.  He      should be able to afford this through his health care plan.  3. Continue wearing back brace if needed, although I want him working      more on core strengthening and lengthening.  4. Lidoderm patches p.r.n.  5. Continue MS Contin 15 mg q.12 h. #60.  6. I had discussion regarding smoking cessation.  The patient also      needs to follow up with his primary care physician regarding blood      pressure.      Ranelle Oyster, M.D.  Electronically Signed     ZTS/MedQ  D:  05/25/2007 13:00:11  T:  05/26/2007 16:18:02  Job #:  [id number]   cc:   Gabriel Earing, M.D.  Fax: [phone]

## 2010-08-26 NOTE — Procedures (Signed)
NAME:  Scott Willis, RAETHER NO.:  [id number]   MEDICAL RECORD NO.:  [id number]          PATIENT TYPE:  AMB   LOCATION:  DAY                          FACILITY:  [facility]   PHYSICIAN:  Erick Colace, M.D.DATE OF BIRTH:  1940/08/12   DATE OF PROCEDURE:  10/09/2008  DATE OF DISCHARGE:  10/04/2008                               OPERATIVE REPORT   PROCEDURE:  Right L5 dorsal ramus injection, right L4 medial branch  block, right L3 branch block, and right L2 medial branch block under  fluoroscopic guidance.   INDICATIONS:  Lumbar facet mediated pain he had greater than 6 weeks  relief following the identical procedure performed at May 28, 2008.  His pain persists despite medication management and other conservative  care.  He is not taking any blood thinners.  He is not being treated for  any type of infection at the current time.   Informed consent was obtained after describing the risks and benefits  procedure with the patient.  These include bleeding, bruising,  infection, and temporary or permanent paralysis.  He elects to proceed  and has given written consent.  The patient was placed prone on the  fluoroscopy table.  Betadine prep, sterile drape.  A 25-gauge 1-1/2-inch  needle was used to anesthetize the skin and subcutaneous tissue with 1%  lidocaine x2 mL.  Then, a 22-gauge 3-1/2-inch spinal needle was inserted  under fluoroscopic guidance, first starting in the right S1, SAP, and  sacroiliac junction.  Bone contact was made that confirmed with lateral  imaging.  Omnipaque 180 x 0.3 mL demonstrated no intravascular uptake  and 0.75 mL of solution containing dexamethasone 1 mL of 4 mg/mL  solution plus 2 mL of 2% MPF lidocaine were injected.  Then, the right  L5, SAP, transverse process junction was targeted.  Bone contact was  made.  Omnipaque 180 x 0.3 mL demonstrated no intravascular uptake.  Then, 0.75 mL of dexamethasone-lidocaine solution was injected.   Then,  the right L4, SAP, transverse process junction was targeted.  Bone  contact was made.  Omnipaque 180 x 0.3 mL demonstrated no intravascular  uptake and 0.75 mL of dexamethasone-lidocaine solution injected in the  right L3, SAP, transverse process junction was targeted.  Bone contact  was made.  Omnipaque 180 x 0.3 mL demonstrated no intravascular uptake,  then 0.75 mL of dexamethasone-lidocaine solution injected.  The patient  tolerated the procedure well.  Pre -and post-injection vitals stable.  Post-injection instructions were given.      Erick Colace, M.D.  Electronically Signed     AEK/MEDQ  D:  10/09/2008 13:58:24  T:  10/10/2008 04:48:40  Job:  [id number]

## 2010-08-26 NOTE — Assessment & Plan Note (Signed)
Scott Willis is back regarding his back.  He feels that over the end of the  June and beginning of July, he flared up his back after some long ride  on the bus, etc.  Incidentally, he complains of a right inguinal pain  which has been severe over the last 2 weeks.  He does not feel that they  are connected directly but notes them appearing around the same time.  Multiple doctors have followed him.  He had a recent colonoscopy which  was done that he states was unremarkable.  He was started back on his MS  Contin and using a bit more, and he was using it 2-3 times a day  apparently is now more due to the pain.  He does feel that the back pain  has subsided a bit.  He describes pain as sharp, burning, stabbing,  constant, tingling, and aching.  Sleep has been poor.   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  He is still smoking 1 plus pack of  cigarettes per day.   REVIEW OF SYSTEMS:  Notable for constipation, numbness, tingling,  abdominal pain, and weight loss.  Other pertinent are positive as above.  Full review is written out in health and history section.   PHYSICAL EXAMINATION:  Blood pressure is 145/84, pulse is 90,  respiratory rate 18, and he is sating 96% on room air.  The patient is  pleasant, alert, and oriented x3.  Back is somewhat tender to palpation  particularly the lower half of the lumbar spine, worse with flexion, and  extension.  Strength is generally 5/5 except for some intrinsic muscles  weakness in the hand more than the feet.  Sensory exam is decreased  distally.  Posture is fair.  The patient had pain in the inguinal region  with palpation.  I palpated above the scrotum for hernia and appreciated  no hernia per say, but the patient was very tender in that area.  Cognitively, he is appropriate.  Vision remains poor.   ASSESSMENT:  1. Chronic low back pain related to lumbar spondylosis, compression       fracture, post laminectomy syndrome/facet arthropathy.  2. Bilateral ulnar nerve and median nerve compression, right greater      than left.  3. Peripheral neuropathy.  4. Cancerous polyp.  5. Diabetes.  6. Hypertension.  7. History of nephrolithiasis.  8. History of right intestinal surgery.  9. Right inguinal pain concerning for an inguinal hernia.   PLAN:  1. The patient should see Dr. Lindie Spruce, who he had seen in the past to      examine him for a hernia.  I see no other obvious source for this      pain.  His hip appears intact, and his back pain should not be      referring in this area.  The patient has significant point      tenderness above the scrotum.  2. Continue Neurontin and Elavil.  3. I increased his MS Contin for short term by 30 mg q.12 h.  4. Continue activity in a light physical level as tolerated.  5. I will see him back in 2 months.      Scott Willis, M.D.  Electronically Signed     ZTS/MedQ  D:  11/15/2007 13:09:37  T:  11/16/2007 10:36:37  Job #:  [id number]   cc:   Scott Willis, M.D.  Fax:  [phone] 

## 2010-08-29 NOTE — Assessment & Plan Note (Signed)
Scott Willis is back regarding his back pain.  He had an MRI done after I  saw him and it showed compression fractures of chronic age at T4, T5, T6  and T12.  He has some facet arthropathy as well in the lower back which  we have addressed previously.  Pain is generally stable.  It is back to  his usual baseline.  He is using Ultram for breakthrough pain, but does  not find these overly helpful.  He uses his back brace during the day.  Pain is 3-4/10 and described as sharp, constant and usually worsened  with prolonged standing and work.  Sleep is poor at times.  Pain worsens  with walking, standing and sometimes prolonged sitting.  Pain improves  with heat.  Also it was better after he had the initial facet  interventions with Dr. Wynn Banker, particularly in the low back.   REVIEW OF SYSTEMS:  Notable for occasional spasms, high sugars, diarrhea  and constipation.  He has had some bladder issues.  Other pertinent  positives are above and full review is in the written health and history  section of the chart.   SOCIAL HISTORY:  The patient lives alone and is working in his [employer].  He still smokes a pack of cigarettes per day.   PHYSICAL EXAMINATION:  VITAL SIGNS:  Blood pressure is 150/84, pulse is  90, respiratory rate 18, he is sating 98% on room air.  GENERAL:  The patient is pleasant, alert, and oriented x3.  He remains  very anxious and sometimes it is difficult to divert from his thoughts  and fixations.  He has pain over the mid-to-lower lumbar spine or  sometimes with flexion than extension that I would say.  He is really  functional with his movement.  His vision is still poor.  He smells of  cigarette smoke.  HEART:  Regular rate.  CHEST:  Clear.  ABDOMEN:  Soft, nontender.   ASSESSMENT:  1. Chronic low back pain/post laminectomy syndrome with lumbar facet      arthropathy.  2. Multiple thoracic compression fractures.  3. Osteoporosis.  4. Macular degeneration.  5. Diabetic peripheral neuropathy.  6. History of nephrolithiasis.   PLAN:  1. We talked at length today regarding his health hygiene.  He needs      to work on better health including diet, smoking cessation,      exercise, postural adaptations etc.  Might be beneficial for him to      talk to his family physician regarding starting on a      bisphosphonate.  2. We will substitute his Ultram with Darvocet-N 100 one q.8 h. p.r.n.      for breakthrough symptoms.  He was written #60 today.  3. The patient may benefit from a hyperextension orthosis.  4. I will see him back in about 4 months' time.  He will call me with      any questions.      Ranelle Oyster, M.D.  Electronically Signed     ZTS/MedQ  D:  12/26/2008 12:57:22  T:  12/27/2008 04:38:31  Job #:  [id number]   cc:   Lillia Carmel, M.D.  Fax: [phone]   Kerrin Champagne, M.D.  Fax: [phone]

## 2010-08-29 NOTE — Discharge Summary (Signed)
NAME:  Scott Willis, SETTLES NO.:  [id number]   MEDICAL RECORD NO.:  [id number]          PATIENT TYPE:  INP   LOCATION:  5036                         FACILITY:  [facility]   PHYSICIAN:  Kerrin Champagne, M.D.   DATE OF BIRTH:  1940-05-30   DATE OF ADMISSION:  03/19/2006  DATE OF DISCHARGE:  03/26/2006                               DISCHARGE SUMMARY   ADMISSION DIAGNOSIS:  1. Painful hardware T7-L1  2. Hypertension.  3. Diabetes mellitus.  4. Macular degeneration.  5. Recent partial colectomy  6. Tobacco abuse.  7. Calcified aorta.   DISCHARGE DIAGNOSIS:  1. Painful hardware T7-L1  2. Hypertension.  3. Diabetes mellitus.  4. Macular degeneration.  5. Recent partial colectomy  6. Tobacco abuse.  7. Calcified aorta.  8. Episode of hypotension requiring ICU overnight stay      postoperatively, resolved.  9. Hypotension felt to be multifactorial, most likely secondary to      antihypertensive medications as well as volume depletion and      narcotic analgesics.   PROCEDURE:  On March 19, 2006, the patient went room underwent removal  of Harrington rods and Edward sleeves as well as removal of four pairs  of [place] wires T9-L1.  Solid fusion noted from T9-L1.  This was  performed by Dr. Otelia Sergeant assisted by Maud Deed [facility] under general  anesthesia.   CONSULTATIONS:  Dr. Shelle Iron.   BRIEF HISTORY:  The patient is a 70 year old male status post L1 burst  fracture treated with decompression and fusion approximately 10 years  prior to this admission.  He has had that chronic and persistent back  pain, worsening over the past year.  Studies including myelogram and CT  scan demonstrated solid fusion at the previous site of the burst  fracture.  The burst fracture itself was measured be approximately 25-  30% radiographically.  Clinically, he had no sign of nerve root  compression.  It was felt he was having mechanical low back pain  secondary to the retained hardware.   It was felt he would benefit from  removal of hardware and was admitted for the procedure as stated above.   BRIEF HOSPITAL COURSE:  The patient tolerated the procedure under  general anesthesia without complications.  On the second postoperative  day, the patient developed hypotension and required transfer to ICU bed.  Multiple studies were performed including a chest x-ray, EKG, urine  culture and sensitivity.  The patient remained in ICU overnight, and his  hypotension resolved after fluid repletion.  Also, decreased and his  narcotic analgesics was required secondary to the multifactorial  hypotension.  He was not felt to have an MI, and his laboratory values  returned with cardiac enzymes within normal limits.  He was noted to be  anemic at hemoglobin 9.7.  It was not felt that he needed a blood  transfusion as it was stable and remained above 9 throughout the  remainder of the hospital stay.  After returning back to the orthopedic  floor from the ICU, he was noted to have neurovascular function of the  lower extremities intact.  His dressing was changed daily and his wound  was found to have no drainage or erythema.  His Foley catheter was  discontinued and he was able to void without difficulty.  The patient  did have episodes of anxiety throughout the hospital stay with pacing  the floor and trembling as well as being tearful.  Clinical social  worker assisted with possible nursing home placement for the patient.  Unfortunately, his insurance company could not provide monetarily for  the nursing home placement, and he did remain in the hospital to receive  physical therapy until he was stable to be discharged to his home for  independent care.  The patient was able to have a bowel movement prior  to discharge.  He was eating well.  Neurovascular and motor function  remained intact in the lower extremities.  The patient was stable on  March 26, 2006, and discharged to his  home.   PERTINENT LABORATORY DATA:  Hemoglobin and hematocrit as stated above.  White blood cell count remained normal throughout the hospital stay.  Chemistry studies with potassium 3.2 on admission normal after repeat.  Glucose ranging 73-172.  On admission, BUN was 24; however, at the time  of discharge 9.  Calcium 7.7.  Hemoglobin A1c 7.5 on March 20, 2006.  Cardiac markers were all within normal limits on December 8 and 9, 2007.  Urinalysis on December 8 and 10, both times were within normal limits.  Urine culture on December 8 showed no growth.  EKG was sinus  bradycardia.   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  He was instructed that  he may shower and change his dressing daily or as needed.  He will  follow up with Dr. Otelia Sergeant 2 weeks from the date of his surgery.  Arrangements were made for home health nurse to do a wound check at  home.  He did not require physical therapy as he was totally independent  at discharge.  Prescriptions given a discharge include Percocet, Robaxin  and Prilosec.  Instructions were given for the remainder of his current  medications as taken prior to admission.  He was advised to use over-the-  counter stool softener as needed.  The patient was encouraged to follow  up with his primary care physician as his hemoglobin A1c was elevated.  All questions encouraged and answered at discharge.      Wende Neighbors, P.A.      Kerrin Champagne, M.D.  Electronically Signed    SMV/MEDQ  D:  04/28/2006  T:  04/29/2006  Job:  [id number]

## 2010-08-29 NOTE — Op Note (Signed)
NAME:  Scott Willis, Scott Willis NO.:  [id number]   MEDICAL RECORD NO.:  [id number]          PATIENT TYPE:  INP   LOCATION:  5034                         FACILITY:  [facility]   PHYSICIAN:  Kerrin Champagne, M.D.   DATE OF BIRTH:  1940-08-07   DATE OF PROCEDURE:  03/19/2006  DATE OF DISCHARGE:                               OPERATIVE REPORT   PREOPERATIVE DIAGNOSIS:  Painful hardware, T7 through L1.   POSTOPERATIVE DIAGNOSES:  1. Painful hardware, T7 through L1.  2. Solid fusion, T9 to L1.   PROCEDURE:  Removal of Harrington rods and Edwards sleeves and removal  of four pairs of [place] wires, T9 to L1.   SURGEON:  Kerrin Champagne, M.D.   ASSISTANT:  Maud Deed, PA-C   ANESTHESIA:  General via orotracheal intubation, Dr. Jacklynn Bue.   ESTIMATED BLOOD LOSS:  50 mL.   SPECIMENS:  Rods, hooks and sleeves returned to the patient.  Wires were  removed as well as buttons with Wisconsin wire system, and these were  destroyed.   COMPLICATIONS:  None.   DRAINS:  Hemovac x1, right lower lumbar spine.   HISTORY OF PRESENT ILLNESS:  The patient is a 70 year old male who has  had a history of previous lumbar L1 burst fracture treated with  decompression and fusion.  The surgery was done in the distant past, at  least 10 years ago.  He has since gone on to fuse the area of  thoracolumbar fusion performed for a burst fracture at the T11 level.  He returns complaining of persistent back pain and discomfort, which is  radiating along the chest wall, both sides.  His studies, including  myelogram and CT scan, demonstrated solid fusion at the previous site of  the burst fracture, the burst fracture itself approximately 25-30%  radiographically.  No sign of nerve root compression.  The patient is  brought to the operating room after the studies do demonstrate loosening  of the hooks of superior and inferior aspect of the construct.  It was  felt that he has painful hardware associated  with loosening of the  Harrington rod and Edwards sleeve system.   INTRAOPERATIVE FINDINGS:  The pedicle hooks superiorly and the laminar  hooks inferiorly appeared to be seated and required cutting of the rod  superiorly in order to remove from their slots attaching them to the  pedicles at the T9 level.   DESCRIPTION OF PROCEDURE:  After adequate general anesthesia, the  patient in a prone position, Wilson frame was used.  All pressure points  well-padded, pillows beneath the legs.  Standard prep with DuraPrep  solution.  Standard preoperative antibiotics of Ancef.  An incision was  made in the midline following infiltration with Marcaine 0.5% and  1:200,000 epinephrine 20 mL, incision through skin and subcu layers  directly down to the spinous processes, extending from T8 to L2 or L3.  Cobbs then used to elevate paralumbar and paradorsal muscles off of the  lateral aspect of the spinous process and lamina extending from T8 to L2  or L3.  Self-retaining retractors were inserted.  Bleeders controlled  using bipolar electrocautery.  The incision carried sharply down to the  hooks superiorly and the Harrington rods bilaterally over the Edwards  sleeves and down the rods to the level of the hooks at the L2 level.  The rods were then carefully exposed.  The the wires, two sets above and  two sets below the Edwards sleeves were then carefully freed up and  untwisted.  Rods were then carefully delivered into the wound using a  hook gripper above.  The C-clamp mechanism below the upper hooks were  then carefully loosened and removed.  The hook connection to the upper  ratchet system was then carefully loosened and the rod lifted away from  the underlying posterior elements of the spine and the bolt cutter was  then used to cut the upper portion of the rods, first on the left side,  then the right side.  With this done, the rod was able to be lifted free  from the posterior aspect of the  thoracolumbar spine and the laminar  hooks rotated out of their slots over the superior aspect of the lamina  of L2.  This was done on both sides.  Bleeders were then controlled  using bipolar and monopolar electrocautery.  Each of the [place] wires  were then carefully freed up down to their buttons on either side of the  spinous processes at each level with four sets identified.  The wires  were then cut close to the buttons and the buttons were removed, the  wire pieces all removed without difficulty.  Irrigation was then carried  out throughout the lumbar spine.  There was no significant bleeding  present.  A single Hemovac drain was placed in the depths of the  incision on the right side exiting over the right lower lumbar region.  Lumbodorsal fascia reapproximated midline to the interspinous ligament  and supraspinous ligament from T8 to L2 in simple and figure-of-eight  fashion.  Deep subcu layers approximated with interrupted and #1 Vicryl  sutures, more superficial layers with interrupted 0 and 2-0 Vicryl  sutures, skin closed with a running subcu stitch of 4-0 Vicryl.  Tincture of benzoin and Steri-Strips applied, 4x4s then fixed to the  skin with ABDs and Hypafix tape.  The patient was then returned to a  supine position, reactivated, extubated and returned to the recovery  room in satisfactory condition.      Kerrin Champagne, M.D.  Electronically Signed     JEN/MEDQ  D:  03/19/2006  T:  03/20/2006  Job:  [id number]

## 2010-08-29 NOTE — Op Note (Signed)
NAME:  Scott Willis, Scott Willis NO.:  [id number]   MEDICAL RECORD NO.:  [id number]          PATIENT TYPE:  OIB   LOCATION:  2550                         FACILITY:  [facility]   PHYSICIAN:  Kerrin Champagne, M.D.   DATE OF BIRTH:  [birth year]   DATE OF PROCEDURE:  08/27/2006  DATE OF DISCHARGE:                               OPERATIVE REPORT   PREOPERATIVE DIAGNOSIS:  1. Severe focal ulnar nerve entrapment at the elbow bilaterally, right      side greater than left, with severe motor and sensory changes.  2. Bilateral carpal tunnel syndrome ulnar nerve entrapment Guyon canal      both wrists.   POSTOPERATIVE DIAGNOSIS:  1. Severe focal ulnar nerve entrapment at the elbow bilaterally, right      side greater than left, with severe motor and sensory changes.  2. Bilateral carpal tunnel syndrome ulnar nerve entrapment Guyon canal      both wrists.   PROCEDURE:  1. Right anterior submuscular transposition of the ulnar nerve at the      elbow.  2. Right carpal tunnel release, open, with release of ulnar nerve at      [place] canal.   SURGEON:  Kerrin Champagne, M.D.   ASSISTANT:  None.   ANESTHESIA:  General via oral tracheal intubation -- Maren Beach,  M.D.   ESTIMATED BLOOD LOSS:  25 mL.   TOTAL TOURNIQUET TIME:  At 215 mmHg - 48 minutes.   BRIEF CLINICAL HISTORY:  The patient is a 70 year old right-hand  dominant male.  He has undergone previous removal of Harrington rods;  and noted to have weakness and wasting of muscles in both hands with  decreasing grip.   DESCRIPTION OF PROCEDURE:  After adequate general anesthesia, with the  patient in the supine position and right arm board.  Tourniquet about  the right upper extremity, standard prep with DuraPrep solution,  standard preoperative antibiotics of Ancef.  The patient was draped in  the usual manner.  The patient had elevation of the right upper  extremity following marking out for the expected incisions for  carpal  tunnel release; and for anterior transposition and ulnar nerve of the  right elbow.  He had elevation/exsanguination in the right upper  extremity.  The tourniquet was inflated to 250 mmHg.  Both the expected  incision which was a linear incision over the right medial elbow  crossing over the prominence of the medial epicondyle, and extending to  the subcutaneous border of the patient's ulna, approximately 6-8 cm  distal to the medial epicondyle, and proximally in-line with the  patient's medial aspect of his triceps.   These areas were infiltrated with Marcaine 1/2% plain; and the right  wrist incision was made approximately 1-1/2 inches in length along the  ulnar aspect of the thenar crease in line with the fourth digit; and  crossing the distal transverse crease of the wrist in oblique fashion  ulnarward.  Incision through skin and subcutaneous layers over the right  wrist; and also over the right medial elbow through the skin and subcu  layers.  Superficial  branches of the distal portions of the medial and  the brachial cutaneous nerve were preserved as best as possible.  The  ulnar nerve identified over the medial aspect of the triceps, just deep  to the fascial layer here; and after incising the fascia.  The  circumferential vessel loop placed about the nerve in order to retract  for freeing up of the nerve.  The nerve was carefully freed, within the  cubital tunnel, releasing fibrotic scar tissue overlying the ulnar nerve  within the cubital tunnel.  The patient's medial epicondyle was quite  prominent.   The nerve was then carefully exposed by releasing the fibers over the  superficial portion of the nerve, posteriorly; and this was carried  distal into the fascia for the flexor compartment.  The proximal forearm  was then released in line with the nerve.  One or two articular  branches, proximally, were then divided and cauterized in order to allow  for mobilization of  the nerve anteriorly.  The patient's vessels to the  ulnar nerve were preserved, as best as possible, both proximally and  distally.  After freeing the ulnar nerve from the cubital tunnel, then  the conjoined tendon, the medial aspect of the elbow.  The medial  epicondyle was carefully subperiosteally released off of the medial  condyle, preserving a cuff of tissue over the lateral and posterior  aspect of the medial epicondyle for a later attachment of the conjoined  tendon.   The tendinous attachment was released off the medial epicondyle and then  elevated, elevating the patient's flexor carpi ulnaris and flexor  muscles proximally.  A nice vascular bed of muscle tissue was found to  be present.  Care was taken to release the medial and collateral  ligament of the elbow and, indeed, the elbow was stressed to ensure that  no collateral ligament release was performed.   Next, the intermuscular septum proximal to this area was released.  Bleeders controlled using bipolar electrocautery.  It was virtually  released down to bone to allow for an area of bed for the ulnar nerve;  and its transposition such that the intermuscular septum would not  impinge on the nerve root nor cause compression here.  Distally the  septum for the patient's flexor compartment was also released in order  to allow the nerve to then be seated within the proximal portion of the  forearm without any fascial impingement.  With this, then, the medial  epicondyle was osteotomized removing a very prominent medial epicondyle;  and debriding this and smoothing this appropriately.   A towel clip was then used to make an opening into the medial epicondyle  to accept the 3-0 Ethibond suture.  The tendinous subperiosteal portion  of the flexor conjoined tendon was then approximated to the raw surface  of the medial epicondyle using a single 3-0 Ethibond suture.  This was done loosely, shortening the tendinous attachment to  allow for the ulnar  nerve to progress in a submuscular fashion without impingement of tissue  here.  The posterior and lateral remaining periosteum was then carefully  approximated to the portion of the tendinous portion of the conjoined  tendon covering the area of the medial condyle.  Carefully then, the  nerve was examined both proximally and distally to ensure that there was  no other further impingement of soft tissue here.  Elevation of the  proximal conjoined tendon demonstrated that there was no further nerve  compression on the  ulnar nerve; and the elbow could be flexed entirely  and extended entirely; and appeared to be in a well-transposed position  with no sign of the nerve root impingement, nor significant stretch on  the nerve root at this point.   Attention turned to the patient's right wrist, then, where incision was  made through the patient's subcu fatty layers down to the distal forearm  fascia; and an area just proximal to the wrist creases of the patient's  right wrist.  The fascia was then elevated using forceps and then  divided with Metzenbaum scissors.  The patient does have a palmaris  longus tendon.  This was retracted radially; and the palmar fascia of  the distal portion of the forearm was released; and median nerve  identified.  Using loupe magnification and headlamp and Ragnell  retractors, then the skin overlying this area was elevated and the  patient's transverse carpal ligament was then divided out into the  palmar fascia; and palmar fascia, itself, divided out to the superficial  palmar arch.  Motor branch of the median nerve identified intact.   The median nerve found to be entrapped with an hour-glass appearance  secondary to hypertrophic changes of the transverse carpal ligament.  Ulnarward, within the incision then, the neurovascular bundle for the  ulnar nerve was identified.  This was then carefully freed from proximal-  to-distal and a Guyon  canal was noted to have been released with the  release of the transverse carpal ligament.  The proximal fold of the  skin was then elevated and undermined with Metzenbaum scissors; and the  distal portions of the forearm fascia released overlying the median  nerve proximally to ensure there was no further impingement within the  median nerve proximal to the wrist.  The tourniquet was then released.  Bleeders controlled using bipolar electrocautery.  There was very little  or no bleeding at the wrist or the elbow.  A few branches in the area of  the medial supracondylar region of the elbow were cauterized medially  using bipolar electrocautery in the area of vessel leash that is  commonly associated with this.  There was no active bleeding present.   Then, a 7-French TLS drain placed in the depth of the incision The subcu  layers were approximated with interrupted 2-0 Vicryl sutures; and the skin closed with interrupted vertical mattress sutures of 4-0 nylon.  The wrist incision was closed by approximating the skin with interrupted  4-0 nylon sutures in a horizontal fashion.  Xeroform 4x4s, ABD pad fixed  to the skin with sterile Webril; and a well-padded ulnar gutter splint  extending along the volar aspect of the right wrist and holding the  wrist at 25-30 degrees dorsiflexion; and the elbow at 90 degrees was  applied.  This was a long-arm, ulnar-gutter splint.  The splint had  completely hardened then.  Note the tourniquet was released.  Total  tourniquet time was 48 minutes.  This was released prior to closure of  the incisions.  The TLS drain was charged.  The patient was then  reactivated, extubated, and returned to the recovery room in  satisfactory condition.  All instrument and sponge counts were correct.      Kerrin Champagne, M.D.  Electronically Signed     JEN/MEDQ  D:  08/27/2006  T:  08/27/2006  Job:  [id number]

## 2010-08-29 NOTE — Op Note (Signed)
NAME:  Scott Willis, BUDNICK NO.:  [id number]   MEDICAL RECORD NO.:  [id number]          PATIENT TYPE:  INP   LOCATION:  2899                         FACILITY:  [facility]   PHYSICIAN:  Cherylynn Ridges, M.D.    DATE OF BIRTH:  [birth year]   DATE OF PROCEDURE:  01/29/2006  DATE OF DISCHARGE:                                 OPERATIVE REPORT   PREOPERATIVE DIAGNOSIS:  Dysplastic polyp of the right colon, ascending  colon.   POSTOPERATIVE DIAGNOSIS:  Dysplastic polyp of the right colon, ascending  colon.   OPERATION PERFORMED:  Right colectomy.   SURGEON:  Cherylynn Ridges, M.D.   ASSISTANT:  Ardeth Sportsman, MD   ANESTHESIA:  General endotracheal.   ESTIMATED BLOOD LOSS:  Less than 100 cc.   COMPLICATIONS:  None.   CONDITION:  Stable.   FINDINGS:  The patient had about a 3 cm sessile polyp in the proximal  descending colon.  No evidence of any liver disease, gallbladder disease or  other significant pathology.   INDICATIONS FOR OPERATION:  The patient is a 70 year old woman who underwent  a colonoscopy and was found to have a dysplastic polyp which could not be  excised in toto. She comes in for colectomy.   OPERATION:  The patient was taken to the operating room, placed on the table  in supine position.  After adequate endotracheal anesthesia was administered  she was prepped and draped in usual sterile manner exposing the abdomen.   A right transverse incision was made at the level of the umbilicus and taken  down through the subcu, through the anterior rectus sheath, rectus muscle  and the posterior rectus sheath.  We entered into the peritoneal cavity  where with Richardson retractors and Balfour retractor we able to mobilize  the right colon along the line of Toldt.  We did so up to and including the  hepatic flexure to the proximal right transverse colon.  We also mobilized  the terminal ileum and what would have been the appendiceal area, however,  the patient  had the appendix previously removed.   We came across the terminal ileum and also the proximal transverse colon  using a GIA-75 stapler.  The intervening mesentery was taken between [place]  clamps and 2-0 silk ties.  The resected specimen was taken out of the field  and opened.  That demonstrated a polyp which we did a cut across on the  antimesenteric border, near the antimesenteric edge of the bowel.  Once we  changed our gloves after examining the specimen, we did a side-to-side  functional end-to-end anastomosis between the terminal ileum and the  transverse colon using a GIA-75 stapler.  Then the TX-60 stapler was used to  close off the resulting enterotomy.  Once that was done we irrigated with  saline, closed the mesentery with 2-0 silk pop-off suture. We then closed  the abdomen in multiple layers.  The posterior and anterior rectus sheaths  were closed using running #1 PDS suture.  Then the skin was closed using  stainless steel staples.  We irrigated at all levels  and sterile dressing  was applied including antibiotic ointment.  All needle, sponge counts and  instrument counts were correct.      Cherylynn Ridges, M.D.  Electronically Signed     JOW/MEDQ  D:  01/29/2006  T:  01/31/2006  Job:  [id number]

## 2010-08-29 NOTE — Assessment & Plan Note (Signed)
[name] is back regarding his multiple pain complaints.  Patient has  found therapy helpful for his back problems.  Tension is helping,  although he does not feel that it is as beneficial as it once was.  He  is using it essentially around the clock, however.  Dr. Otelia Sergeant has sent  him for nerve conduction testing, he had severe neuropathies in the  right and left upper extremities involving the ulnar and median nerves.  He is having nerve release surgery in the near future.  Patient has  continued with OxyContin for baseline pain control.  We switched to  Cymbalta in the morning, and his sleep has been better.  Scott Willis has  been complaining, however, of some sensitivity in the right chest, as  well as at the bottom of the ribs along the rectus abdominis muscle.  He  feels that the areas are sensitive to touch.  He also has had some  burning in his legs in the past as well.  Patient rates his pain overall  as 5/10 to 6/10.  Pain affects general activity, relations with others,  and enjoyment of life on a moderate level.  Sleep is fair to poor.   REVIEW OF SYSTEMS:  There are no particular changes other than those  mentioned above.  Patient did have a bone scan, which revealed severe  osteoporosis apparently.  I do not have a report to view today.   SOCIAL HISTORY:  Without significant change.  The patient continues to  smoke a pack of cigarettes a day.   PHYSICAL EXAMINATION:  Blood pressure is 134/70.  Pulse is 88.  Respiratory rate is 16.  He saturating 97% on room air.  Patient is pleasant, in no acute distress.  He stands with a flexed  posture.  He has his TENS unit in place.  He remains tender on the right  paraspinal's and facets on the low to mid thoracic spine.  He stands  with a little bit of a bend to the left today.  He continues to have  limited flexion and extension due to his pain, but he is able to flex a  bit better than he had previously.  He has a hard time  sitting still due  to some discomfort.  Straight leg testing was negative.  Motor function  was only 5/5 in the lower extremities.  He did continue to have weakness  in the intrinsic muscles of the hands, right greater than the left with  chronic compression/wasting sign seen in the intrinsic muscles.  Tinel's  test was positive at the elbow and wrist.  HEART:  Regular.  CHEST:  Clear.  ABDOMEN:  Soft and non-tender.  Vision is poor.   ASSESSMENT:  1. Chronic low back pain, which remains multifactorial with lumbar      spondylosis, compression fractures, postlaminectomy syndrome      symptoms.  Patient also has facet arthropathy.  He seems to be      doing a bit better after therapy, and with the TENS unit.  2. Bilateral ulnar and median nerve compression.  3. History of cancerous polyps.  4. Centralized pain, particularly in the chest and abdomen.  He has a      history of prior leg pain.  We could be dealing with a fibromyalgia      syndrome here as well.   PLAN:  1. Continue OxyContin 20 mg q.8 hours, as the patient often takes this  more than twice a day to cover his pain, which is fine for now.  2. Continue a.m. Cymbalta dosing.  3. Add Tofranil p.m. 75 mg q. nightly for sleep and central pain.  4. Continue Lidoderm patch.  5. Continues with TENS unit, as this has been helpful.  6. We will send patient for hyperextension arthrosis for thoracic and      lumbar spine to improve his posture and pain control.  7. Hand and elbow surgery as planned in the near future per Nitka.  8. I will see the patient back in about 2 months' time.      Ranelle Oyster, M.D.  Electronically Signed     ZTS/MedQ  D:  08/11/2006 13:13:09  T:  08/11/2006 14:30:18  Job #:  [id number]   cc:   Kerrin Champagne, M.D.  Fax: [phone]   Gabriel Earing, M.D.  Fax: [phone]

## 2010-08-29 NOTE — Discharge Summary (Signed)
NAME:  PEYTON, SPENGLER NO.:  [id number]   MEDICAL RECORD NO.:  [id number]          PATIENT TYPE:  INP   LOCATION:  5712                         FACILITY:  [facility]   PHYSICIAN:  Cherylynn Ridges, M.D.    DATE OF BIRTH:  22-May-1940   DATE OF ADMISSION:  01/29/2006  DATE OF DISCHARGE:                                 DISCHARGE SUMMARY   Diet is regular.  He is being discharged to [facility] nursing  fac[facility].  He is to follow up to see me in 2 weeks.   DISCHARGE DIAGNOSIS:  Villous adenoma of the right colon, and he underwent a  right colectomy.   DISCHARGE MEDICATIONS:  In addition to his preoperative medications, he will  get:   1. Reglan 10 mg twice a day.  2. Vicodin one tablet, one to two every 4 hours as needed for pain.   Other discharge medications will be his preop medications, including:  1. Cymbalta 60 mg a day.  2. Aspirin 81 mg a day.  3. Systane Eye Drops eye lubricant as needed.  4. Metformin ER 500 mg daily.  5. Lisinopril and hydrochlorothiazide combination 20/25 mg one tablet      daily.  6. Omeprazole 20 mg daily.  7. Neomycin, erythromycin and so forth for his preop prep.  8. Ambien to take as needed for sleep.   CONDITION:  Stable.   BRIEF SUMMARY OF HOSPITAL COURSE:  The patient was admitted the day of his  surgery on January 29, 2006.  He underwent at that time a right colectomy  for a polyp that was known from preoperative colonoscopy.  The polyp's  pathology demonstrated adenomatous polyps with a focus of high-grade  glandular dysplasia, no invasive carcinoma.  He had 10 pericolonic lymph  nodes all negative for tumor and no tumor at the margin.  His postoperative  course was really uneventful.  He started to take a diet postoperative day  #4 and was progressed from  that point.  The staples were removed on postoperative day #8, and he was  discharged to home doing well, eating well, having normal bowel movements on  postoperative day #11.  His follow-up is to see Dr. Lindie Spruce in 2 weeks.  No  oncologic follow-up is necessary.      Cherylynn Ridges, M.D.  Electronically Signed     JOW/MEDQ  D:  02/09/2006  T:  02/09/2006  Job:  [id number]   cc:   Anselmo Rod, M.D.  Georga Hacking, M.D.

## 2010-12-29 ENCOUNTER — Encounter (INDEPENDENT_AMBULATORY_CARE_PROVIDER_SITE_OTHER): Payer: PRIVATE HEALTH INSURANCE | Admitting: Ophthalmology

## 2010-12-29 DIAGNOSIS — H43819 Vitreous degeneration, unspecified eye: Secondary | ICD-10-CM

## 2010-12-29 DIAGNOSIS — H3530 Unspecified macular degeneration: Secondary | ICD-10-CM

## 2010-12-29 DIAGNOSIS — H353290 Exudative age-related macular degeneration, unspecified eye, stage unspecified: Secondary | ICD-10-CM

## 2011-01-05 LAB — I-STAT 8, (EC8 V) (CONVERTED LAB)
Acid-Base Excess: 1
BUN: 10
Bicarbonate: 25 — ABNORMAL HIGH
Chloride: 108
Glucose, Bld: 239 — ABNORMAL HIGH
HCT: 46
Hemoglobin: 15.6
Operator id: 151321
Potassium: 4
Sodium: 140
TCO2: 26
pCO2, Ven: 36.6 — ABNORMAL LOW
pH, Ven: 7.442 — ABNORMAL HIGH

## 2011-01-05 LAB — DIFFERENTIAL
Basophils Absolute: 0
Basophils Relative: 0
Eosinophils Absolute: 0.1
Eosinophils Relative: 1
Lymphocytes Relative: 20
Lymphs Abs: 2.3
Monocytes Absolute: 0.5
Monocytes Relative: 5
Neutro Abs: 8.3 — ABNORMAL HIGH
Neutrophils Relative %: 74

## 2011-01-05 LAB — BASIC METABOLIC PANEL
BUN: 11
CO2: 26
Calcium: 8.9
Chloride: 102
Creatinine, Ser: 1.1
GFR calc Af Amer: >60
GFR calc non Af Amer: >60
Glucose, Bld: 234 — ABNORMAL HIGH
Potassium: 4
Sodium: 139

## 2011-01-05 LAB — POCT I-STAT CREATININE
Creatinine, Ser: 1.1
Operator id: 151321

## 2011-01-05 LAB — CBC
HCT: 43.3
Hemoglobin: 14.4
MCHC: 33.3
MCV: 91.2
Platelets: 211
RBC: 4.75
RDW: 14.1
WBC: 11.2 — ABNORMAL HIGH

## 2011-01-06 LAB — POCT I-STAT, CHEM 8
BUN: 26 — ABNORMAL HIGH
Calcium, Ion: 1.18
Chloride: 102
Creatinine, Ser: 2.2 — ABNORMAL HIGH
Glucose, Bld: 171 — ABNORMAL HIGH
HCT: 41
Hemoglobin: 13.9
Potassium: 3.9
Sodium: 140
TCO2: 25

## 2011-01-13 LAB — COMPREHENSIVE METABOLIC PANEL
ALT: 15
AST: 20
Albumin: 3.7
Alkaline Phosphatase: 54
BUN: 38 — ABNORMAL HIGH
CO2: 24
Calcium: 10.6 — ABNORMAL HIGH
Chloride: 105
Creatinine, Ser: 3.57 — ABNORMAL HIGH
GFR calc Af Amer: 21 — ABNORMAL LOW
GFR calc non Af Amer: 17 — ABNORMAL LOW
Glucose, Bld: 82
Potassium: 6.9
Sodium: 136
Total Bilirubin: 0.3
Total Protein: 6.2

## 2011-01-13 LAB — URINE MICROSCOPIC-ADD ON

## 2011-01-13 LAB — URINALYSIS, ROUTINE W REFLEX MICROSCOPIC
Bilirubin Urine: NEGATIVE
Glucose, UA: NEGATIVE
Glucose, UA: NEGATIVE
Hgb urine dipstick: NEGATIVE
Ketones, ur: NEGATIVE
Ketones, ur: NEGATIVE
Nitrite: NEGATIVE
Nitrite: NEGATIVE
Protein, ur: NEGATIVE
Protein, ur: NEGATIVE
Specific Gravity, Urine: 1.016
Specific Gravity, Urine: 1.022
Urobilinogen, UA: 0.2
Urobilinogen, UA: 1
pH: 6
pH: 6.5

## 2011-01-13 LAB — BASIC METABOLIC PANEL
BUN: 16
BUN: 21
BUN: 27 — ABNORMAL HIGH
CO2: 25
CO2: 26
CO2: 26
Calcium: 10.4
Calcium: 8.9
Calcium: 9.1
Chloride: 105
Chloride: 105
Chloride: 105
Creatinine, Ser: 1.17
Creatinine, Ser: 1.29
Creatinine, Ser: 2.03 — ABNORMAL HIGH
GFR calc Af Amer: 40 — ABNORMAL LOW
GFR calc Af Amer: >60
GFR calc Af Amer: >60
GFR calc non Af Amer: 33 — ABNORMAL LOW
GFR calc non Af Amer: 56 — ABNORMAL LOW
GFR calc non Af Amer: >60
Glucose, Bld: 122 — ABNORMAL HIGH
Glucose, Bld: 79
Glucose, Bld: 81
Potassium: 3.7
Potassium: 4.1
Potassium: 5.2 — ABNORMAL HIGH
Sodium: 139
Sodium: 140
Sodium: 142

## 2011-01-13 LAB — URINE CULTURE
Colony Count: NO GROWTH
Culture: NO GROWTH

## 2011-01-13 LAB — TROPONIN I: Troponin I: <0.01

## 2011-01-13 LAB — CBC
HCT: 36.7 — ABNORMAL LOW
HCT: 38.8 — ABNORMAL LOW
HCT: 41.7
Hemoglobin: 12.3 — ABNORMAL LOW
Hemoglobin: 12.7 — ABNORMAL LOW
Hemoglobin: 13.7
MCHC: 32.8
MCHC: 32.8
MCHC: 33.5
MCV: 93.9
MCV: 94.4
MCV: 94.9
Platelets: 184
Platelets: 204
Platelets: 222
RBC: 3.9 — ABNORMAL LOW
RBC: 4.09 — ABNORMAL LOW
RBC: 4.42
RDW: 14.4
RDW: 14.9
RDW: 15.1
WBC: 14.3 — ABNORMAL HIGH
WBC: 9.2
WBC: 9.6

## 2011-01-13 LAB — GLUCOSE, CAPILLARY
Glucose-Capillary: 104 — ABNORMAL HIGH
Glucose-Capillary: 109 — ABNORMAL HIGH
Glucose-Capillary: 125 — ABNORMAL HIGH
Glucose-Capillary: 172 — ABNORMAL HIGH
Glucose-Capillary: 183 — ABNORMAL HIGH
Glucose-Capillary: 59 — ABNORMAL LOW
Glucose-Capillary: 71
Glucose-Capillary: 86
Glucose-Capillary: 86

## 2011-01-13 LAB — HEMOGLOBIN A1C
Hgb A1c MFr Bld: 6.1
Mean Plasma Glucose: 128

## 2011-01-13 LAB — DIFFERENTIAL
Basophils Absolute: 0
Basophils Relative: 0
Eosinophils Absolute: 0.6
Eosinophils Relative: 6 — ABNORMAL HIGH
Lymphocytes Relative: 24
Lymphs Abs: 2.3
Monocytes Absolute: 0.6
Monocytes Relative: 6
Neutro Abs: 6
Neutrophils Relative %: 63

## 2011-01-13 LAB — CARDIAC PANEL(CRET KIN+CKTOT+MB+TROPI)
CK, MB: 4.6 — ABNORMAL HIGH
CK, MB: 5.1 — ABNORMAL HIGH
Relative Index: INVALID
Relative Index: INVALID
Total CK: 51
Total CK: 58
Troponin I: <0.01
Troponin I: <0.01

## 2011-01-13 LAB — TSH: TSH: 0.803

## 2011-01-13 LAB — CK TOTAL AND CKMB (NOT AT ARMC)
CK, MB: 3.2
Relative Index: INVALID
Total CK: 49

## 2011-01-13 LAB — POTASSIUM
Potassium: 4.8
Potassium: 6 — ABNORMAL HIGH

## 2011-01-13 LAB — LIPID PANEL
Cholesterol: 178
HDL: 29 — ABNORMAL LOW
LDL Cholesterol: 134 — ABNORMAL HIGH
Total CHOL/HDL Ratio: 6.1
Triglycerides: 76
VLDL: 15

## 2011-02-23 ENCOUNTER — Encounter (INDEPENDENT_AMBULATORY_CARE_PROVIDER_SITE_OTHER): Payer: PRIVATE HEALTH INSURANCE | Admitting: Ophthalmology

## 2011-02-23 DIAGNOSIS — H353290 Exudative age-related macular degeneration, unspecified eye, stage unspecified: Secondary | ICD-10-CM

## 2011-02-23 DIAGNOSIS — H3530 Unspecified macular degeneration: Secondary | ICD-10-CM

## 2011-02-23 DIAGNOSIS — H43819 Vitreous degeneration, unspecified eye: Secondary | ICD-10-CM

## 2011-04-14 HISTORY — PX: EYE SURGERY: SHX253

## 2011-04-27 ENCOUNTER — Encounter (INDEPENDENT_AMBULATORY_CARE_PROVIDER_SITE_OTHER): Payer: Medicare Other | Admitting: Ophthalmology

## 2011-04-27 DIAGNOSIS — H3530 Unspecified macular degeneration: Secondary | ICD-10-CM

## 2011-04-27 DIAGNOSIS — H353290 Exudative age-related macular degeneration, unspecified eye, stage unspecified: Secondary | ICD-10-CM

## 2011-04-27 DIAGNOSIS — H2510 Age-related nuclear cataract, unspecified eye: Secondary | ICD-10-CM

## 2011-04-27 DIAGNOSIS — H43819 Vitreous degeneration, unspecified eye: Secondary | ICD-10-CM

## 2011-06-15 ENCOUNTER — Encounter (INDEPENDENT_AMBULATORY_CARE_PROVIDER_SITE_OTHER): Payer: Medicare Other | Admitting: Ophthalmology

## 2011-06-15 DIAGNOSIS — H353290 Exudative age-related macular degeneration, unspecified eye, stage unspecified: Secondary | ICD-10-CM

## 2011-06-15 DIAGNOSIS — H2510 Age-related nuclear cataract, unspecified eye: Secondary | ICD-10-CM

## 2011-06-15 DIAGNOSIS — H3530 Unspecified macular degeneration: Secondary | ICD-10-CM

## 2011-06-15 DIAGNOSIS — H43819 Vitreous degeneration, unspecified eye: Secondary | ICD-10-CM

## 2011-08-03 ENCOUNTER — Encounter (INDEPENDENT_AMBULATORY_CARE_PROVIDER_SITE_OTHER): Payer: Medicare Other | Admitting: Ophthalmology

## 2011-08-03 DIAGNOSIS — H353290 Exudative age-related macular degeneration, unspecified eye, stage unspecified: Secondary | ICD-10-CM

## 2011-08-03 DIAGNOSIS — H43819 Vitreous degeneration, unspecified eye: Secondary | ICD-10-CM

## 2011-08-03 DIAGNOSIS — H3530 Unspecified macular degeneration: Secondary | ICD-10-CM

## 2011-08-03 DIAGNOSIS — H2510 Age-related nuclear cataract, unspecified eye: Secondary | ICD-10-CM

## 2011-09-14 ENCOUNTER — Encounter (INDEPENDENT_AMBULATORY_CARE_PROVIDER_SITE_OTHER): Payer: Medicare Other | Admitting: Ophthalmology

## 2011-09-14 DIAGNOSIS — H2510 Age-related nuclear cataract, unspecified eye: Secondary | ICD-10-CM

## 2011-09-14 DIAGNOSIS — H3530 Unspecified macular degeneration: Secondary | ICD-10-CM

## 2011-09-14 DIAGNOSIS — H43819 Vitreous degeneration, unspecified eye: Secondary | ICD-10-CM

## 2011-09-14 DIAGNOSIS — H353290 Exudative age-related macular degeneration, unspecified eye, stage unspecified: Secondary | ICD-10-CM

## 2011-09-21 ENCOUNTER — Encounter (INDEPENDENT_AMBULATORY_CARE_PROVIDER_SITE_OTHER): Payer: Medicare Other | Admitting: Ophthalmology

## 2011-10-28 ENCOUNTER — Encounter (INDEPENDENT_AMBULATORY_CARE_PROVIDER_SITE_OTHER): Payer: Medicare Other | Admitting: Ophthalmology

## 2011-10-28 DIAGNOSIS — H353290 Exudative age-related macular degeneration, unspecified eye, stage unspecified: Secondary | ICD-10-CM

## 2011-10-28 DIAGNOSIS — H35039 Hypertensive retinopathy, unspecified eye: Secondary | ICD-10-CM

## 2011-10-28 DIAGNOSIS — H3530 Unspecified macular degeneration: Secondary | ICD-10-CM

## 2011-10-28 DIAGNOSIS — H43819 Vitreous degeneration, unspecified eye: Secondary | ICD-10-CM

## 2011-10-28 DIAGNOSIS — H2510 Age-related nuclear cataract, unspecified eye: Secondary | ICD-10-CM

## 2011-10-28 DIAGNOSIS — I10 Essential (primary) hypertension: Secondary | ICD-10-CM

## 2011-12-16 ENCOUNTER — Encounter (INDEPENDENT_AMBULATORY_CARE_PROVIDER_SITE_OTHER): Payer: Medicare Other | Admitting: Ophthalmology

## 2011-12-16 DIAGNOSIS — H353290 Exudative age-related macular degeneration, unspecified eye, stage unspecified: Secondary | ICD-10-CM

## 2011-12-16 DIAGNOSIS — I10 Essential (primary) hypertension: Secondary | ICD-10-CM

## 2011-12-16 DIAGNOSIS — H3530 Unspecified macular degeneration: Secondary | ICD-10-CM

## 2011-12-16 DIAGNOSIS — H2510 Age-related nuclear cataract, unspecified eye: Secondary | ICD-10-CM

## 2011-12-16 DIAGNOSIS — H35039 Hypertensive retinopathy, unspecified eye: Secondary | ICD-10-CM

## 2011-12-16 DIAGNOSIS — H43819 Vitreous degeneration, unspecified eye: Secondary | ICD-10-CM

## 2012-02-10 ENCOUNTER — Encounter (INDEPENDENT_AMBULATORY_CARE_PROVIDER_SITE_OTHER): Payer: Medicare Other | Admitting: Ophthalmology

## 2012-02-10 DIAGNOSIS — H35039 Hypertensive retinopathy, unspecified eye: Secondary | ICD-10-CM

## 2012-02-10 DIAGNOSIS — H3530 Unspecified macular degeneration: Secondary | ICD-10-CM

## 2012-02-10 DIAGNOSIS — H43819 Vitreous degeneration, unspecified eye: Secondary | ICD-10-CM

## 2012-02-10 DIAGNOSIS — I10 Essential (primary) hypertension: Secondary | ICD-10-CM

## 2012-02-10 DIAGNOSIS — H353290 Exudative age-related macular degeneration, unspecified eye, stage unspecified: Secondary | ICD-10-CM

## 2012-04-04 ENCOUNTER — Encounter (INDEPENDENT_AMBULATORY_CARE_PROVIDER_SITE_OTHER): Payer: Medicare Other | Admitting: Ophthalmology

## 2012-04-04 DIAGNOSIS — H43819 Vitreous degeneration, unspecified eye: Secondary | ICD-10-CM

## 2012-04-04 DIAGNOSIS — I10 Essential (primary) hypertension: Secondary | ICD-10-CM

## 2012-04-04 DIAGNOSIS — H35039 Hypertensive retinopathy, unspecified eye: Secondary | ICD-10-CM

## 2012-04-04 DIAGNOSIS — H3530 Unspecified macular degeneration: Secondary | ICD-10-CM

## 2012-04-04 DIAGNOSIS — H353290 Exudative age-related macular degeneration, unspecified eye, stage unspecified: Secondary | ICD-10-CM

## 2012-06-06 ENCOUNTER — Encounter (INDEPENDENT_AMBULATORY_CARE_PROVIDER_SITE_OTHER): Payer: Medicare Other | Admitting: Ophthalmology

## 2012-06-06 DIAGNOSIS — H43819 Vitreous degeneration, unspecified eye: Secondary | ICD-10-CM

## 2012-06-06 DIAGNOSIS — H3530 Unspecified macular degeneration: Secondary | ICD-10-CM

## 2012-06-06 DIAGNOSIS — I10 Essential (primary) hypertension: Secondary | ICD-10-CM

## 2012-06-06 DIAGNOSIS — H35039 Hypertensive retinopathy, unspecified eye: Secondary | ICD-10-CM

## 2012-06-06 DIAGNOSIS — H353290 Exudative age-related macular degeneration, unspecified eye, stage unspecified: Secondary | ICD-10-CM

## 2012-08-15 ENCOUNTER — Encounter (INDEPENDENT_AMBULATORY_CARE_PROVIDER_SITE_OTHER): Payer: Medicare Other | Admitting: Ophthalmology

## 2012-08-15 DIAGNOSIS — I10 Essential (primary) hypertension: Secondary | ICD-10-CM

## 2012-08-15 DIAGNOSIS — H35039 Hypertensive retinopathy, unspecified eye: Secondary | ICD-10-CM

## 2012-08-15 DIAGNOSIS — H3530 Unspecified macular degeneration: Secondary | ICD-10-CM

## 2012-08-15 DIAGNOSIS — H43819 Vitreous degeneration, unspecified eye: Secondary | ICD-10-CM

## 2012-08-15 DIAGNOSIS — H353290 Exudative age-related macular degeneration, unspecified eye, stage unspecified: Secondary | ICD-10-CM

## 2012-10-24 ENCOUNTER — Encounter (INDEPENDENT_AMBULATORY_CARE_PROVIDER_SITE_OTHER): Payer: Medicare Other | Admitting: Ophthalmology

## 2012-10-24 DIAGNOSIS — H3530 Unspecified macular degeneration: Secondary | ICD-10-CM

## 2012-10-24 DIAGNOSIS — H353290 Exudative age-related macular degeneration, unspecified eye, stage unspecified: Secondary | ICD-10-CM

## 2013-01-16 ENCOUNTER — Encounter (INDEPENDENT_AMBULATORY_CARE_PROVIDER_SITE_OTHER): Payer: Medicare Other | Admitting: Ophthalmology

## 2013-01-16 DIAGNOSIS — H43819 Vitreous degeneration, unspecified eye: Secondary | ICD-10-CM

## 2013-01-16 DIAGNOSIS — H353290 Exudative age-related macular degeneration, unspecified eye, stage unspecified: Secondary | ICD-10-CM

## 2013-01-16 DIAGNOSIS — H3530 Unspecified macular degeneration: Secondary | ICD-10-CM

## 2013-01-16 DIAGNOSIS — H35039 Hypertensive retinopathy, unspecified eye: Secondary | ICD-10-CM

## 2013-01-16 DIAGNOSIS — I10 Essential (primary) hypertension: Secondary | ICD-10-CM

## 2013-01-30 ENCOUNTER — Encounter (INDEPENDENT_AMBULATORY_CARE_PROVIDER_SITE_OTHER): Payer: Medicare Other | Admitting: Ophthalmology

## 2013-01-30 DIAGNOSIS — H3530 Unspecified macular degeneration: Secondary | ICD-10-CM

## 2013-01-30 DIAGNOSIS — H353290 Exudative age-related macular degeneration, unspecified eye, stage unspecified: Secondary | ICD-10-CM

## 2013-01-30 DIAGNOSIS — H35039 Hypertensive retinopathy, unspecified eye: Secondary | ICD-10-CM

## 2013-01-30 DIAGNOSIS — H43819 Vitreous degeneration, unspecified eye: Secondary | ICD-10-CM

## 2013-01-30 DIAGNOSIS — H00029 Hordeolum internum unspecified eye, unspecified eyelid: Secondary | ICD-10-CM

## 2013-01-30 DIAGNOSIS — I10 Essential (primary) hypertension: Secondary | ICD-10-CM

## 2013-03-13 ENCOUNTER — Encounter: Payer: Self-pay | Admitting: Internal Medicine

## 2013-03-13 ENCOUNTER — Ambulatory Visit (INDEPENDENT_AMBULATORY_CARE_PROVIDER_SITE_OTHER): Payer: Medicare Other | Admitting: Internal Medicine

## 2013-03-13 VITALS — BP 138/70 | HR 80 | Temp 97.8°F | Resp 12 | Ht <= 58 in | Wt 180.0 lb

## 2013-03-13 DIAGNOSIS — M818 Other osteoporosis without current pathological fracture: Secondary | ICD-10-CM

## 2013-03-13 DIAGNOSIS — R7989 Other specified abnormal findings of blood chemistry: Secondary | ICD-10-CM

## 2013-03-13 DIAGNOSIS — IMO0002 Reserved for concepts with insufficient information to code with codable children: Secondary | ICD-10-CM

## 2013-03-13 DIAGNOSIS — E1139 Type 2 diabetes mellitus with other diabetic ophthalmic complication: Secondary | ICD-10-CM

## 2013-03-13 DIAGNOSIS — E291 Testicular hypofunction: Secondary | ICD-10-CM

## 2013-03-13 DIAGNOSIS — I10 Essential (primary) hypertension: Secondary | ICD-10-CM

## 2013-03-13 DIAGNOSIS — H579 Unspecified disorder of eye and adnexa: Secondary | ICD-10-CM

## 2013-03-13 DIAGNOSIS — E119 Type 2 diabetes mellitus without complications: Secondary | ICD-10-CM | POA: Insufficient documentation

## 2013-03-13 DIAGNOSIS — T387X5A Adverse effect of androgens and anabolic congeners, initial encounter: Secondary | ICD-10-CM

## 2013-03-13 DIAGNOSIS — E785 Hyperlipidemia, unspecified: Secondary | ICD-10-CM

## 2013-03-13 DIAGNOSIS — H3530 Unspecified macular degeneration: Secondary | ICD-10-CM

## 2013-03-13 DIAGNOSIS — N400 Enlarged prostate without lower urinary tract symptoms: Secondary | ICD-10-CM

## 2013-03-13 NOTE — Progress Notes (Signed)
Patient ID: Scott Willis, male   DOB: 1940-07-16, 72 y.o.   MRN: [id number]   Location:  [facility]  Code status:  Need to address at physical  Allergies  Allergen Reactions  . Tape     Any medical tape, Rash   . Flurox [Fluorescein-Benoxinate]     Rash   . Metformin And Related     Severe Diarrhea   . Morphine And Related     Kidney failure     Chief Complaint  Patient presents with  . Establish Care    New patient establish care, patient will follow-up with Endo- DM  . Medication Management    Discuss if Test Injection should be continued, discuss if Vit D should be continued     HPI: Patient is a 72 y.o. white male seen in the office today to establish with the practice.  Was recommended by Dr. Prince Rome' office (he moved to [facility]) and seeing Dr. Leslie Dales who also recommended this group.    A few things going on with Dr. Barbie Haggis seen for 3-4 mos.  Had been going monthly for 7 years.  Is in constant pain in his back--prior rods 20 years ago, had out 6-7 yrs ago--pain worse since then.  Not on meds for that.  Does not like narcotics.    DMII--sees Dr. Leslie Dales for this.  Developed allergy to metformin.  Was put on januvia--is unable to afford in [place].  Also on actos.  Has macular degeneration.  Light bothers his eyes so wears sunglasses.  Left eye essentially blind.  Q 10 weeks injections for right eye now.  Also has blepharitis on right eye.  Says vision is biggest concern.  Goes back to ophtho this week.  Using gel and hot compresses 20-40x per day.    Had shot of testosterone monthly for 7 years.  Saw Dr. Prince Rome every 3 mos.    Cannot trim toenails due to weakness of hands and inability to bend over.  Had carpal tunnel of right hand.  Get toenails trimmed every 3 mos.  Needs them done.    Has several questions about medications.  His list he provided needed to be updated and he did not have a problem list, only procedure  list.  He sees several specialists.    Refuses vaccinations including pneumococcal and flu shots.  Thinks his smoking will kill everything.  Smoked since 72 yo.  Sees lung doctor also.    Was getting PSA checked regularly and testosterone levels.    Fri am glucose 177 after thanksgiving.  Sat am, 155.  Sun and today 125 and 124.  If high repeats in afternoon.  Exercises at 4am each morning.  Is a workaholic.  Last hba1c 7.1 in october.      Has right shoulder bursitis history.    Osteoarthritis of neck.  Has cervical myelopathy.  Has no strength in first and second digits.  When had large polyp in right intestines--had large piece of colon removed.  Is due for next cscope--Dr. Elnoria Howard.    Had cyst in right kidney.  Has had prior kidney stones.  They were noted in the left kidney on imaging.    Review of Systems:  Review of Systems  Constitutional: Negative for fever, weight loss and malaise/fatigue.  HENT: Negative for congestion.   Eyes: Positive for blurred vision, photophobia and pain.       Macular degeneration, current blepharitis right eye, blind  left eye, wears dark glasses  Respiratory: Negative for cough and shortness of breath.   Cardiovascular: Negative for chest pain.       Hypertension  Gastrointestinal: Positive for constipation. Negative for blood in stool and melena.       Flatulence, hiatal hernia, pain with stools, thin stools  Genitourinary: Positive for dysuria, urgency and frequency.       Slow stream; h/o kidney stones  Musculoskeletal: Positive for back pain, joint pain, myalgias and neck pain. Negative for falls.       Osteoporosis, neck arthritis  Skin:       H/o ulcers, long toenails need trimming  Neurological: Positive for sensory change. Negative for weakness and headaches.       Balance problem  Endo/Heme/Allergies: Positive for polydipsia. Does not bruise/bleed easily.       Testosterone deficiency  Psychiatric/Behavioral: The patient is  nervous/anxious and has insomnia.        Stress, mood swings    Past Medical History  Diagnosis Date  . Macular degeneration   . High cholesterol   . High blood pressure   . Diabetes   . Severe osteopetrosis   . Low serum testosterone level   . Enlarged prostate   . Blepharitis, squamous     Right Eye     Past Surgical History  Procedure Laterality Date  . Back surgery  1998    Dr.Nitka  . Refractive surgery  09/2005    Macular Degeneration Dr.Mathews   . Back surgery  03/2006    Removed Rods Dr.Nitka   . Carpal tunnel release  08/2006  . Cataract extraction  02/21/2007    Dr.Epps   . Kidney stone surgery  08/11/2007    Dr.Tannenbaum   . Carpal tunnel release  08/2006    Dr.Nitka   . Lithotripsy  08/11/2007    Dr.Nesi  . Eye surgery  2013    Right eye every 8 weeks     Social History:   reports that he has been smoking Cigarettes.  He has a 88.5 pack-year smoking history. He does not have any smokeless tobacco history on file. He reports that he does not drink alcohol or use illicit drugs.  Family History  Problem Relation Age of Onset  . Colon cancer Father   . Alzheimer's disease Mother   . Diabetes Sister     Medications: Patient's Medications  New Prescriptions   No medications on file  Previous Medications   BACITRACIN-POLYMYXIN B, OPHTH, (POLYSPORIN) OINT    Place 1 application into the right eye. Dr.Cohen, 1 application nightly with warm compress   CEPHALEXIN (KEFLEX) 500 MG CAPSULE    Take 500 mg by mouth 2 (two) times daily. Dr.Groat or Dr.Cohen   DIAZEPAM (VALIUM) 5 MG TABLET    Take 5 mg by mouth. 1 by mouth on the day of eye surgery every 10 weeks  Dr.[name]   DICLOFENAC SODIUM (PENNSAID TD)    Place onto the skin as needed.   FINASTERIDE (PROSCAR) 5 MG TABLET    Take 5 mg by mouth daily. Dr.Nesi   GABAPENTIN (NEURONTIN) 600 MG TABLET    Take 600 mg by mouth 3 (three) times daily.   HYDROCODONE-ACETAMINOPHEN (NORCO/VICODIN) 5-325 MG PER TABLET     Take 1 tablet by mouth. 1 by mouth every 10 weeks prior to eye surgery (30 min prior to surgery)   KETOROLAC (ACULAR) 0.5 % OPHTHALMIC SOLUTION    Place 1 drop into the right eye  3 (three) times daily. Every 10 weeks, Dr.David [name]   PIOGLITAZONE (ACTOS) 30 MG TABLET    Take 30 mg by mouth daily. Dr.[name]   POLYETHYL GLYCOL-PROPYL GLYCOL (SYSTANE OP)    Apply to eye as needed.   TESTOSTERONE CYPIONATE (DEPOTESTOTERONE CYPIONATE) 100 MG/ML INJECTION    Inject 100 mg into the muscle every 30 (thirty) days. For IM use only   VENLAFAXINE XR (EFFEXOR-XR) 75 MG 24 HR CAPSULE    Take 75 mg by mouth daily with breakfast.   VITAMIN D, CHOLECALCIFEROL, PO    Take 1.25 mg by mouth every 30 (thirty) days.   Modified Medications   No medications on file  Discontinued Medications   MOXIFLOXACIN (VIGAMOX) 0.5 % OPHTHALMIC SOLUTION    Place 3 drops into both eyes 3 (three) times daily. Dr.[name]   SUPER B COMPLEX/C CAPS    Take by mouth daily.   TAMSULOSIN (FLOMAX) 0.4 MG CAPS CAPSULE    Take 0.4 mg by mouth daily. Dr.Nesi   TESTOSTERONE CYPIONATE (DEPOTESTOTERONE CYPIONATE) 200 MG/ML INJECTION    Inject into the muscle every 30 (thirty) days.   TRIMETHOPRIM-POLYMYXIN B (POLYTRIM) OPHTHALMIC SOLUTION    Place 1 drop into both eyes 4 (four) times daily. After eye surgery   VENLAFAXINE (EFFEXOR) 75 MG TABLET    Take 75 mg by mouth daily.   Physical Exam: Filed Vitals:   03/13/13 0842  BP: 138/70  Pulse: 80  Temp: 97.8 F (36.6 C)  TempSrc: Oral  Resp: 12  Height: 4' 9.08" (1.45 m)  Weight: 180 lb (81.647 kg)  SpO2: 98%  Physical Exam  Constitutional: He is oriented to person, place, and time. He appears well-developed and well-nourished. No distress.  Thin white male, nad, odor of smoke present  HENT:  Head: Normocephalic and atraumatic.  Right Ear: External ear normal.  Left Ear: External ear normal.  Nose: Nose normal.  Mouth/Throat: Oropharynx is clear and moist. No oropharyngeal  exudate.  Eyes: Pupils are equal, round, and reactive to light.  Right eye with blepharitis--upper lid with eschar from opened abscess  Cardiovascular: Normal rate, regular rhythm, normal heart sounds and intact distal pulses.   Pulmonary/Chest: Effort normal and breath sounds normal. No respiratory distress.  Abdominal: Soft. Bowel sounds are normal. He exhibits no distension. There is no tenderness.  Musculoskeletal: He exhibits tenderness.  Thoracolumbar paravertebral muscles bilaterally worse on right than left;  Atrophy of thenar muscles bilateral hands right worse than left  Neurological: He is alert and oriented to person, place, and time.  Skin: Skin is warm and dry.  Psychiatric: His behavior is normal. Judgment and thought content normal.  Anxious, very talkative, pleasant male    Labs reviewed: Lab Results  Component Value Date   HGBA1C  Value: 6.1 (NOTE)   The ADA recommends the following therapeutic goal for glycemic   control related to Hgb A1C measurement:   Goal of Therapy:   < 7.0% Hgb A1C   Reference: American Diabetes Association: Clinical Practice   Recommendations 2008, Diabetes Care,  2008, 31:(Suppl 1). 02/21/2008   Past Procedures: 02/22/08 renal US:  1. No hydronephrosis. 2. Small nonobstructing lower pole renal calculi bilaterally.   02/22/08:  MRI brain:  1. No evidence of acute ischemia.2. Non specific subcortical white matter changes supratentorially.  Differential considerations would be areas of ischemic gliosis related small vessel disease due to hypertension and/or diabetes with less likely possibility of a demyelinating illness or a  vasculitis. 3. Mild inflammatory thickening  of the mucosa in the ethmoids and the frontal sinuses, with probable mucous retention in the right maxillary sinus.  02/21/08:  CT angiogram:  1. No occlusions stenosis dissections or aneurysms seen on the images provided.  09/10/08:  CT abd/pelvis:  11 mm proximal right ureteral  calculus producing moderate right hydroureteronephrosis. Bilateral nephrolithiasis.  No acute intrapelvic finding. Please see CT abdomen report above. Remote T12 compression fracture.  11/19/08:  MRI thoracic spine:  Chronic compression fractures of T4, T5, T6 and T12. No acute compression fracture or acute abnormality.  MRI lumbar spine:  Mild lumbar spine degenerative disease, with L4-L5 facet hypertrophy and tiny left L5 foraminal osteophyte.  04/30/10:  Abdominal ultrasound:  1. No acute findings. 2. Hepatic steatosis. 3. Left renal stone.   07/16/10 Carotid doppler:Mild atherosclerotic disease of the carotid arteries.No significant carotid artery stenosis.Estimated degree of stenosis in the internal carotid arteries less than 50%.   Assessment/Plan 1. Macular degeneration -severe with visual loss of left eye and getting treatments in right -sounds like he also has some diabetic retinopathy, too  2. Benign essential hypertension -bp at goal with current meds  3. Hyperlipidemia LDL goal < 100 -last lipids not known--need records from prior physician, Dr. Prince Rome  4. Osteoporosis due to androgen therapy -cont vitamin D supplement, again need records form Dr. Prince Rome  5. Low serum testosterone level -continue monthly injections, will need records about this, as well, and will perform these here--needs regular psa and testosterone levels and blood count monitoring  6. BPH (benign prostatic hyperplasia) -follows with Dr. Julien Girt for this and and he says a cyst on his right kidney and prior kidney stones, cont current meds  7. DM (diabetes mellitus) type II uncontrolled with eye manifestation -cont actos, is going to f/u with Dr. Leslie Dales re: his DMII--is not able to afford Januvia while in [place]ut hole -am glucose readings have certainly been satisfactory for his age though he only checks in the am  Labs/tests ordered:   None so far--will get medical records Next appt:  1 month for EV and labs

## 2013-03-13 NOTE — Patient Instructions (Addendum)
Podiatrists:   [facility]  Will obtain records from Dr. Prince Rome and specialists.  Need to get colonoscopy with Dr. Elnoria Howard in January.

## 2013-03-27 ENCOUNTER — Encounter (INDEPENDENT_AMBULATORY_CARE_PROVIDER_SITE_OTHER): Payer: Medicare Other | Admitting: Ophthalmology

## 2013-03-27 DIAGNOSIS — H3530 Unspecified macular degeneration: Secondary | ICD-10-CM

## 2013-03-27 DIAGNOSIS — H35039 Hypertensive retinopathy, unspecified eye: Secondary | ICD-10-CM

## 2013-03-27 DIAGNOSIS — I10 Essential (primary) hypertension: Secondary | ICD-10-CM

## 2013-03-27 DIAGNOSIS — H353290 Exudative age-related macular degeneration, unspecified eye, stage unspecified: Secondary | ICD-10-CM

## 2013-04-11 ENCOUNTER — Encounter: Payer: Self-pay | Admitting: Internal Medicine

## 2013-04-11 ENCOUNTER — Ambulatory Visit (INDEPENDENT_AMBULATORY_CARE_PROVIDER_SITE_OTHER): Payer: Medicare Other | Admitting: Internal Medicine

## 2013-04-11 VITALS — BP 136/82 | HR 85 | Temp 99.1°F | Resp 12 | Wt 168.2 lb

## 2013-04-11 DIAGNOSIS — I10 Essential (primary) hypertension: Secondary | ICD-10-CM

## 2013-04-11 DIAGNOSIS — IMO0002 Reserved for concepts with insufficient information to code with codable children: Secondary | ICD-10-CM

## 2013-04-11 DIAGNOSIS — E1139 Type 2 diabetes mellitus with other diabetic ophthalmic complication: Secondary | ICD-10-CM

## 2013-04-11 DIAGNOSIS — R231 Pallor: Secondary | ICD-10-CM

## 2013-04-11 DIAGNOSIS — J111 Influenza due to unidentified influenza virus with other respiratory manifestations: Secondary | ICD-10-CM

## 2013-04-11 DIAGNOSIS — K259 Gastric ulcer, unspecified as acute or chronic, without hemorrhage or perforation: Secondary | ICD-10-CM

## 2013-04-11 DIAGNOSIS — K921 Melena: Secondary | ICD-10-CM

## 2013-04-11 MED ORDER — OSELTAMIVIR PHOSPHATE 75 MG PO CAPS: ORAL_CAPSULE | ORAL | Status: DC

## 2013-04-11 MED ORDER — ESOMEPRAZOLE MAGNESIUM 40 MG PO CPDR: DELAYED_RELEASE_CAPSULE | ORAL | Status: DC

## 2013-04-11 NOTE — Progress Notes (Addendum)
Patient ID: Scott Willis, male   DOB: 1940-06-10, 72 y.o.   MRN: [id number]    Location:    [facility]  Place of Service:  Office    Allergies  Allergen Reactions  . Tape     Any medical tape, Rash   . Flurox [Fluorescein-Benoxinate]     Rash   . Metformin And Related     Severe Diarrhea   . Morphine And Related     Kidney failure   . Statins     Loss of appetite    Chief Complaint  Patient presents with  . URI    Congestion,cough,sweats (just a little), and fatigue,x 2-3 week s    HPI:  Sick for about 3 weeks. Sharp pain in the upper abd. Had a sudden loose stool after eating a pear. Stools have changed. Some are soft and some solid.has had some watery stools. Has urgency. Can get confused with urination. Has had half of his intestine removed by Dr. Lindie Spruce due to benign polyp. He is due for another colonoscopy by Dr. Elnoria Howard  Arthritis in neck. Had rods in back and this hurts worse.   Medications: Patient's Medications  New Prescriptions   No medications on file  Previous Medications   DIAZEPAM (VALIUM) 5 MG TABLET    Take 5 mg by mouth. 1 by mouth on the day of eye surgery ONCE every 10 weeks  Dr.[name]   DICLOFENAC SODIUM (PENNSAID TD)    Place onto the skin as needed.   GABAPENTIN (NEURONTIN) 600 MG TABLET    Take 600 mg by mouth 3 (three) times daily.   HYDROCODONE-ACETAMINOPHEN (NORCO/VICODIN) 5-325 MG PER TABLET    Take 1 tablet by mouth. 1 by mouth every 10 weeks prior to eye surgery (30 min prior to surgery)   KETOROLAC (ACULAR) 0.5 % OPHTHALMIC SOLUTION    Place 1 drop into the right eye 3 (three) times daily. Every 10 weeks, Dr.David [name]   PIOGLITAZONE (ACTOS) 30 MG TABLET    Take 30 mg by mouth daily. Dr.[name]   POLYETHYL GLYCOL-PROPYL GLYCOL (SYSTANE OP)    Apply to eye as needed.   SITAGLIPTIN (JANUVIA) 100 MG TABLET    Take 100 mg by mouth daily.   TESTOSTERONE CYPIONATE (DEPOTESTOTERONE CYPIONATE) 100 MG/ML INJECTION    Inject 100 mg into the  muscle every 30 (thirty) days. For IM use only   VENLAFAXINE XR (EFFEXOR-XR) 75 MG 24 HR CAPSULE    Take 75 mg by mouth daily with breakfast.   VITAMIN D, CHOLECALCIFEROL, PO    Take 1.25 mg by mouth every 30 (thirty) days.   Modified Medications   No medications on file  Discontinued Medications   BACITRACIN-POLYMYXIN B, OPHTH, (POLYSPORIN) OINT    Place 1 application into the right eye. Dr.Cohen, 1 application nightly with warm compress   CEPHALEXIN (KEFLEX) 500 MG CAPSULE    Take 500 mg by mouth 2 (two) times daily. Dr.Groat or Dr.Cohen   FINASTERIDE (PROSCAR) 5 MG TABLET    Take 5 mg by mouth daily. Dr.Nesi     Review of Systems  Constitutional: Positive for fever (low grade), activity change, fatigue and unexpected weight change (losing weight). Negative for chills.  HENT: Positive for postnasal drip.   Eyes: Positive for visual disturbance (macular degeneration).       Blind OS and impaired vision in the right eye. Gets injections i the right eye.  Respiratory: Positive for cough, chest tightness and shortness of breath.  Cardiovascular: Positive for chest pain. Negative for palpitations and leg swelling.  Gastrointestinal: Positive for abdominal pain.       History of hiatal hernia. Hx bleeding ulcer 2010.  Endocrine:       Hx  DM  Genitourinary:       Hx kidney stones and removal by Dr. Brunilda Payor  Musculoskeletal: Positive for back pain and myalgias.  Skin: Positive for pallor.  Allergic/Immunologic: Negative.   Neurological: Positive for weakness. Negative for dizziness, tremors, seizures, syncope and speech difficulty.  Hematological: Negative for adenopathy. Does not bruise/bleed easily.  Psychiatric/Behavioral: Negative for behavioral problems and confusion.    Filed Vitals:   04/11/13 1446  BP: 136/82  Pulse: 85  Temp: 99.1 F (37.3 C)  TempSrc: Oral  Resp: 12  Weight: 168 lb 3.2 oz (76.295 kg)  SpO2: 94%   Physical Exam  Constitutional: He is oriented to person,  place, and time. He appears well-developed and well-nourished. No distress.  Thin white male, nad, odor of smoke present  HENT:  Head: Normocephalic and atraumatic.  Right Ear: External ear normal.  Left Ear: External ear normal.  Nose: Nose normal.  Mouth/Throat: Oropharynx is clear and moist. No oropharyngeal exudate.  Eyes: Pupils are equal, round, and reactive to light.  Right eye with blepharitis--upper lid with eschar from opened abscess  Cardiovascular: Normal rate, regular rhythm, normal heart sounds and intact distal pulses.   Pulmonary/Chest: Effort normal and breath sounds normal. No respiratory distress.  Abdominal: Soft. Bowel sounds are normal. He exhibits no distension. There is no tenderness.  Musculoskeletal: He exhibits tenderness.  Thoracolumbar paravertebral muscles bilaterally worse on right than left;  Atrophy of thenar muscles bilateral hands right worse than left  Neurological: He is alert and oriented to person, place, and time.  Skin: Skin is warm and dry.  Psychiatric: His behavior is normal. Judgment and thought content normal.  Anxious, very talkative, pleasant male     Labs reviewed: No visits with results within 3 Month(s) from this visit. Latest known visit with results is:  Hospital Outpatient Visit on 10/04/2008  Component Date Value Range Status  . Glucose-Capillary 10/04/2008 149* 70 - 99 mg/dL Final  . Glucose-Capillary 10/04/2008 240* 70 - 99 mg/dL Final      Assessment/Plan 1. Influenza Added Tamiflu 75 mg bid x 5 d  2. Pallor - CBC With differential/Platelet; Future - CBC With differential/Platelet  3. DM (diabetes mellitus) type II uncontrolled with eye manifestation controlled  4. Benign essential hypertension controlled  5. Gastric ulcer resume esomeprazole (NEXIUM) 40 MG capsule; One daily to reduce stomach acid  Dispense: 15 capsule; Refill: 0  6. Melena F/u CBC

## 2013-04-11 NOTE — Patient Instructions (Signed)
Get the Tamiflu prescription at you drugstore.

## 2013-04-12 LAB — CBC WITH DIFFERENTIAL
Basophils Absolute: 0 10*3/uL (ref 0.0–0.2)
Basos: 0 %
Eos: 3 %
Eosinophils Absolute: 0.2 10*3/uL (ref 0.0–0.4)
HCT: 43 % (ref 37.5–51.0)
Hemoglobin: 14.9 g/dL (ref 12.6–17.7)
Immature Grans (Abs): 0 10*3/uL (ref 0.0–0.1)
Immature Granulocytes: 0 %
Lymphocytes Absolute: 3 10*3/uL (ref 0.7–3.1)
Lymphs: 41 %
MCH: 31.2 pg (ref 26.6–33.0)
MCHC: 34.7 g/dL (ref 31.5–35.7)
MCV: 90 fL (ref 79–97)
Monocytes Absolute: 0.7 10*3/uL (ref 0.1–0.9)
Monocytes: 9 %
Neutrophils Absolute: 3.3 10*3/uL (ref 1.4–7.0)
Neutrophils Relative %: 47 %
Platelets: 199 10*3/uL (ref 150–379)
RBC: 4.77 x10E6/uL (ref 4.14–5.80)
RDW: 14 % (ref 12.3–15.4)
WBC: 7.3 10*3/uL (ref 3.4–10.8)

## 2013-04-17 ENCOUNTER — Telehealth: Payer: Self-pay | Admitting: *Deleted

## 2013-04-17 NOTE — Telephone Encounter (Signed)
Patient called and stated that he has finished his coarse of antibiotic of Tamiflu yesterday and patient states he feels no better. Having sweats, eats and it goes straight through him, fatigue, hoarse and achy.  Please Advise. Patient #: [phone] Pharmacy: [facility]

## 2013-04-17 NOTE — Telephone Encounter (Signed)
Forwarded to Dr. Mariea Clonts.

## 2013-04-17 NOTE — Telephone Encounter (Signed)
Ask Dr. Mariea Clonts if she would want to see him prior to his next scheduled appt on 04/24/13.

## 2013-04-17 NOTE — Telephone Encounter (Signed)
Should be seen again.  Sounds like his diarrhea needs to be addressed.  I know I don't have any appointments.  Can he get in with Janett Billow?

## 2013-04-18 NOTE — Telephone Encounter (Signed)
Patient Notified and appointment set up with Dr. Nyoka Cowden 04/19/2013

## 2013-04-19 ENCOUNTER — Encounter: Payer: Self-pay | Admitting: Internal Medicine

## 2013-04-19 ENCOUNTER — Ambulatory Visit (INDEPENDENT_AMBULATORY_CARE_PROVIDER_SITE_OTHER): Payer: No Typology Code available for payment source | Admitting: Internal Medicine

## 2013-04-19 ENCOUNTER — Ambulatory Visit
Admission: RE | Admit: 2013-04-19 | Discharge: 2013-04-19 | Disposition: A | Discharge status: Home / Self Care | Payer: Medicare Other | Source: Ambulatory Visit | Attending: Internal Medicine | Admitting: Internal Medicine

## 2013-04-19 VITALS — BP 136/78 | HR 68 | Temp 98.0°F | Ht <= 58 in | Wt 175.6 lb

## 2013-04-19 DIAGNOSIS — I10 Essential (primary) hypertension: Secondary | ICD-10-CM

## 2013-04-19 DIAGNOSIS — H579 Unspecified disorder of eye and adnexa: Secondary | ICD-10-CM

## 2013-04-19 DIAGNOSIS — F17200 Nicotine dependence, unspecified, uncomplicated: Secondary | ICD-10-CM

## 2013-04-19 DIAGNOSIS — J209 Acute bronchitis, unspecified: Secondary | ICD-10-CM

## 2013-04-19 DIAGNOSIS — E1165 Type 2 diabetes mellitus with hyperglycemia: Secondary | ICD-10-CM

## 2013-04-19 DIAGNOSIS — IMO0002 Reserved for concepts with insufficient information to code with codable children: Secondary | ICD-10-CM

## 2013-04-19 DIAGNOSIS — E1139 Type 2 diabetes mellitus with other diabetic ophthalmic complication: Secondary | ICD-10-CM

## 2013-04-19 MED ORDER — DOXYCYCLINE HYCLATE 100 MG PO TABS: 100.0000 mg | ORAL_TABLET | Freq: Two times a day (BID) | ORAL | Status: DC

## 2013-04-19 MED ORDER — GUAIFENESIN ER 600 MG PO TB12: ORAL_TABLET | ORAL | Status: DC

## 2013-04-19 NOTE — Progress Notes (Signed)
Patient ID: Scott Willis, male   DOB: February 05, 1941, 73 y.o.   MRN: [id number]    Location:     [facility]  Place of Service:   OFFICE    Allergies  Allergen Reactions  . Tape     Any medical tape, Rash   . Flurox [Fluorescein-Benoxinate]     Rash   . Metformin And Related     Severe Diarrhea   . Morphine And Related     Kidney failure   . Statins     Loss of appetite    Chief Complaint  Patient presents with  . Diarrhea    Diarrhea, sweats and Fatigue    HPI:  Acute bronchitis -persistent cough, sweats, and fatigue. Hoarse and rattling in the chest.  Tobacco use disorder: smoked since age 71  Benign essential hypertension: controlled  DM (diabetes mellitus) type II uncontrolled with eye manifestation: home glucose 120 today  Diarrhea is improved.  Medications: Patient's Medications  New Prescriptions   DOXYCYCLINE (VIBRA-TABS) 100 MG TABLET    Take 1 tablet (100 mg total) by mouth 2 (two) times daily.   GUAIFENESIN (MUCINEX) 600 MG 12 HR TABLET    One twice daily to thin mucus  Previous Medications   DIAZEPAM (VALIUM) 5 MG TABLET    Take 5 mg by mouth. 1 by mouth on the day of eye surgery ONCE every 10 weeks  Dr.[name]   DICLOFENAC SODIUM (PENNSAID TD)    Place onto the skin as needed.   ESOMEPRAZOLE (NEXIUM) 40 MG CAPSULE    One daily to reduce stomach acid   GABAPENTIN (NEURONTIN) 600 MG TABLET    Take 600 mg by mouth 3 (three) times daily.   HYDROCODONE-ACETAMINOPHEN (NORCO/VICODIN) 5-325 MG PER TABLET    Take 1 tablet by mouth. 1 by mouth every 10 weeks prior to eye surgery (30 min prior to surgery)   KETOROLAC (ACULAR) 0.5 % OPHTHALMIC SOLUTION    Place 1 drop into the right eye 3 (three) times daily. Every 10 weeks, Dr.David [name]   OSELTAMIVIR (TAMIFLU) 75 MG CAPSULE    One twice daily for influenza   PIOGLITAZONE (ACTOS) 30 MG TABLET    Take 30 mg by mouth daily. Dr.[name]   POLYETHYL GLYCOL-PROPYL GLYCOL (SYSTANE OP)    Apply to eye as needed.   SITAGLIPTIN (JANUVIA) 100 MG TABLET    Take 100 mg by mouth daily.   TESTOSTERONE CYPIONATE (DEPOTESTOTERONE CYPIONATE) 100 MG/ML INJECTION    Inject 100 mg into the muscle every 30 (thirty) days. For IM use only   VENLAFAXINE XR (EFFEXOR-XR) 75 MG 24 HR CAPSULE    Take 75 mg by mouth daily with breakfast.   VITAMIN D, CHOLECALCIFEROL, PO    Take 1.25 mg by mouth every 30 (thirty) days.   Modified Medications   No medications on file  Discontinued Medications   No medications on file     Review of Systems  Constitutional: Positive for activity change and fatigue. Negative for fever, chills and unexpected weight change.  HENT: Positive for postnasal drip.   Eyes: Positive for visual disturbance (macular degeneration).       Blind OS and impaired vision in the right eye. Gets injections i the right eye.  Respiratory: Positive for cough, chest tightness and shortness of breath.   Cardiovascular: Positive for chest pain. Negative for palpitations and leg swelling.  Gastrointestinal: Positive for abdominal pain (RUQ, intermittent).       History of hiatal hernia. Hx  bleeding ulcer 2010.  Endocrine:       Hx  DM  Genitourinary:       Hx kidney stones and removal by Dr. Janice Norrie  Musculoskeletal: Positive for back pain and myalgias.  Skin: Positive for pallor.  Allergic/Immunologic: Negative.   Neurological: Positive for weakness. Negative for dizziness, tremors, seizures, syncope and speech difficulty.  Hematological: Negative for adenopathy. Does not bruise/bleed easily.  Psychiatric/Behavioral: Negative for behavioral problems and confusion.    Filed Vitals:   04/19/13 1400  BP: 136/78  Pulse: 68  Temp: 98 F (36.7 C)  TempSrc: Oral  Height: 4' 9.08" (1.45 m)  Weight: 175 lb 9.6 oz (79.652 kg)   Physical Exam  Constitutional: He is oriented to person, place, and time. He appears well-developed and well-nourished. No distress.  Thin white male, nad, odor of smoke present  HENT:    Head: Normocephalic and atraumatic.  Right Ear: External ear normal.  Left Ear: External ear normal.  Nose: Nose normal.  Mouth/Throat: Oropharynx is clear and moist. No oropharyngeal exudate.  Eyes: Pupils are equal, round, and reactive to light.  Right eye with blepharitis--upper lid with eschar from opened abscess  Cardiovascular: Normal rate, regular rhythm, normal heart sounds and intact distal pulses.   Pulmonary/Chest: Effort normal and breath sounds normal. No respiratory distress.  Abdominal: Soft. Bowel sounds are normal. He exhibits no distension. There is no tenderness.  Musculoskeletal: He exhibits tenderness.  Thoracolumbar paravertebral muscles bilaterally worse on right than left;  Atrophy of thenar muscles bilateral hands right worse than left  Neurological: He is alert and oriented to person, place, and time.  Skin: Skin is warm and dry.  Psychiatric: His behavior is normal. Judgment and thought content normal.  Anxious, very talkative, pleasant male     Labs reviewed: Office Visit on 04/11/2013  Component Date Value Range Status  . WBC 04/11/2013 7.3  3.4 - 10.8 x10E3/uL Final  . RBC 04/11/2013 4.77  4.14 - 5.80 x10E6/uL Final  . Hemoglobin 04/11/2013 14.9  12.6 - 17.7 g/dL Final  . HCT 04/11/2013 43.0  37.5 - 51.0 % Final  . MCV 04/11/2013 90  79 - 97 fL Final  . MCH 04/11/2013 31.2  26.6 - 33.0 pg Final  . MCHC 04/11/2013 34.7  31.5 - 35.7 g/dL Final  . RDW 04/11/2013 14.0  12.3 - 15.4 % Final  . Platelets 04/11/2013 199  150 - 379 x10E3/uL Final  . Neutrophils Relative % 04/11/2013 47   Final  . Lymphs 04/11/2013 41   Final  . Monocytes 04/11/2013 9   Final  . Eos 04/11/2013 3   Final  . Basos 04/11/2013 0   Final  . Neutrophils Absolute 04/11/2013 3.3  1.4 - 7.0 x10E3/uL Final  . Lymphocytes Absolute 04/11/2013 3.0  0.7 - 3.1 x10E3/uL Final  . Monocytes Absolute 04/11/2013 0.7  0.1 - 0.9 x10E3/uL Final  . Eosinophils Absolute 04/11/2013 0.2  0.0 - 0.4  x10E3/uL Final  . Basophils Absolute 04/11/2013 0.0  0.0 - 0.2 x10E3/uL Final  . Immature Granulocytes 04/11/2013 0   Final  . Immature Grans (Abs) 04/11/2013 0.0  0.0 - 0.1 x10E3/uL Final      Assessment/Plan  Acute bronchitis - Plan: doxycycline (VIBRA-TABS) 100 MG tablet, DG Chest 2 View, guaiFENesin (MUCINEX) 600 MG 12 hr tablet  Tobacco use disorder: advised to quit  Benign essential hypertension: controlled  DM (diabetes mellitus) type II uncontrolled with eye manifestation: controlled

## 2013-04-19 NOTE — Patient Instructions (Signed)
Use medication as listed 

## 2013-04-24 ENCOUNTER — Encounter: Payer: Self-pay | Admitting: Internal Medicine

## 2013-04-24 ENCOUNTER — Ambulatory Visit (INDEPENDENT_AMBULATORY_CARE_PROVIDER_SITE_OTHER): Payer: No Typology Code available for payment source | Admitting: Internal Medicine

## 2013-04-24 VITALS — BP 124/70 | HR 76 | Temp 97.9°F | Ht 70.0 in | Wt 179.2 lb

## 2013-04-24 DIAGNOSIS — E11311 Type 2 diabetes mellitus with unspecified diabetic retinopathy with macular edema: Secondary | ICD-10-CM

## 2013-04-24 DIAGNOSIS — M48062 Spinal stenosis, lumbar region with neurogenic claudication: Secondary | ICD-10-CM

## 2013-04-24 DIAGNOSIS — E291 Testicular hypofunction: Secondary | ICD-10-CM

## 2013-04-24 DIAGNOSIS — M818 Other osteoporosis without current pathological fracture: Secondary | ICD-10-CM

## 2013-04-24 DIAGNOSIS — T387X5A Adverse effect of androgens and anabolic congeners, initial encounter: Secondary | ICD-10-CM

## 2013-04-24 DIAGNOSIS — J209 Acute bronchitis, unspecified: Secondary | ICD-10-CM

## 2013-04-24 DIAGNOSIS — E1139 Type 2 diabetes mellitus with other diabetic ophthalmic complication: Secondary | ICD-10-CM

## 2013-04-24 DIAGNOSIS — Z0000 Encounter for general adult medical examination without abnormal findings: Secondary | ICD-10-CM

## 2013-04-24 DIAGNOSIS — E349 Endocrine disorder, unspecified: Secondary | ICD-10-CM

## 2013-04-24 DIAGNOSIS — Z23 Encounter for immunization: Secondary | ICD-10-CM

## 2013-04-24 DIAGNOSIS — E11359 Type 2 diabetes mellitus with proliferative diabetic retinopathy without macular edema: Secondary | ICD-10-CM

## 2013-04-24 DIAGNOSIS — F17200 Nicotine dependence, unspecified, uncomplicated: Secondary | ICD-10-CM

## 2013-04-24 DIAGNOSIS — I10 Essential (primary) hypertension: Secondary | ICD-10-CM

## 2013-04-24 MED ORDER — VITAMIN D (CHOLECALCIFEROL) 25 MCG (1000 UT) PO TABS: 2000.0000 [IU] | ORAL_TABLET | Freq: Every day | ORAL | Status: DC

## 2013-04-24 NOTE — Progress Notes (Addendum)
Patient ID: Scott Willis, male   DOB: 1940/07/21, 73 y.o.   MRN: [id number]   Location:  [facility]  Code Status:   DNR, does not want heroic measures   Allergies  Allergen Reactions  . Tape     Any medical tape, Rash   . Flurox [Fluorescein-Benoxinate]     Rash   . Metformin And Related     Severe Diarrhea   . Morphine And Related     Kidney failure   . Statins     Loss of appetite    Chief Complaint  Patient presents with  . Annual Exam    Physical   . other    will schedule with Dr Benson Norway (due for one now), BS averaging  101- 142  fasting in the AM  . Immunizations    never had Pneumo vaccines, declines flu and shingles    HPI: Patient is a 73 y.o. white male seen in the office today for his annual physical.  Due for cscope--to schedule with Dr Benson Norway.    DMII--glucose 101-142 fasting.  Refuses flu and shingles shots.  Has never had pneumovax or prevnar.      Had acute bronchitis and completed flu treatment.  Normally exercises before and after work.  Does not eat badly.   Still smoking away.  Had not been sick until this episode.    Review of Systems:  Review of Systems  Constitutional: Negative for fever, chills and malaise/fatigue.  HENT: Negative for congestion and hearing loss.   Eyes: Positive for blurred vision.       Nearly blind  Respiratory: Negative for cough, sputum production and shortness of breath.   Cardiovascular: Negative for chest pain.  Gastrointestinal: Negative for abdominal pain, diarrhea, constipation, blood in stool and melena.  Genitourinary: Negative for dysuria, urgency and frequency.  Musculoskeletal: Positive for back pain. Negative for falls and myalgias.  Skin: Negative for rash.  Neurological: Negative for dizziness, weakness and headaches.  Endo/Heme/Allergies:       Diabetes  Psychiatric/Behavioral: Negative for depression and memory loss. The patient is not nervous/anxious.      Past Medical History  Diagnosis Date  . Macular degeneration   . Hyperlipidemia LDL goal < 100   . Benign essential hypertension   . DM (diabetes mellitus) type II uncontrolled with eye manifestation   . Osteoporosis due to androgen therapy   . Low serum testosterone level   . BPH (benign prostatic hyperplasia)   . Blepharitis, squamous     Right Eye     Past Surgical History  Procedure Laterality Date  . Back surgery  1998    Dr.Nitka  . Refractive surgery  09/2005    Macular Degeneration Dr.Mathews   . Back surgery  03/2006    Removed Rods Dr.Nitka   . Carpal tunnel release  08/2006  . Cataract extraction  02/21/2007    Dr.Epps   . Kidney stone surgery  08/11/2007    Dr.Tannenbaum   . Carpal tunnel release  08/2006    Dr.Nitka   . Lithotripsy  08/11/2007    Dr.Nesi  . Eye surgery  2013    Right eye every 8 weeks     Social History:   reports that he has been smoking Cigarettes.  He has a 88.5 pack-year smoking history. He does not have any smokeless tobacco history on file. He reports that he does not drink alcohol or use illicit drugs.  Family History  Problem Relation Age of Onset  . Colon cancer Father   . Alzheimer's disease Mother   . Diabetes Sister     Medications: Patient's Medications  New Prescriptions   No medications on file  Previous Medications   DIAZEPAM (VALIUM) 5 MG TABLET    Take 5 mg by mouth. 1 by mouth on the day of eye surgery ONCE every 10 weeks  Dr.[name]   DICLOFENAC SODIUM (PENNSAID TD)    Place onto the skin as needed.   DOXYCYCLINE (VIBRA-TABS) 100 MG TABLET    Take 1 tablet (100 mg total) by mouth 2 (two) times daily.   ESOMEPRAZOLE (NEXIUM) 40 MG CAPSULE    One daily to reduce stomach acid   FINASTERIDE (PROSCAR) 5 MG TABLET    Take 5 mg by mouth daily.   GABAPENTIN (NEURONTIN) 600 MG TABLET    Take 600 mg by mouth 3 (three) times daily.   HYDROCODONE-ACETAMINOPHEN (NORCO/VICODIN) 5-325 MG PER TABLET    Take 1 tablet by  mouth. 1 by mouth every 10 weeks prior to eye surgery (30 min prior to surgery)   KETOROLAC (ACULAR) 0.5 % OPHTHALMIC SOLUTION    Place 1 drop into the right eye 3 (three) times daily. Every 10 weeks, Dr.David [name]   PIOGLITAZONE (ACTOS) 30 MG TABLET    Take 30 mg by mouth daily. Dr.[name]   POLYETHYL GLYCOL-PROPYL GLYCOL (SYSTANE OP)    Apply to eye as needed.   SITAGLIPTIN (JANUVIA) 100 MG TABLET    Take 100 mg by mouth daily.   TESTOSTERONE CYPIONATE (DEPOTESTOTERONE CYPIONATE) 100 MG/ML INJECTION    Inject 100 mg into the muscle every 30 (thirty) days. For IM use only   TRIMETHOPRIM-POLYMYXIN B (POLYTRIM) OPHTHALMIC SOLUTION    Place 1 drop into the right eye 4 (four) times daily.   VENLAFAXINE XR (EFFEXOR-XR) 75 MG 24 HR CAPSULE    Take 75 mg by mouth daily with breakfast.   VITAMIN D, CHOLECALCIFEROL, PO    Take 1.25 mg by mouth every 30 (thirty) days.   Modified Medications   No medications on file  Discontinued Medications   GUAIFENESIN (MUCINEX) 600 MG 12 HR TABLET    One twice daily to thin mucus   OSELTAMIVIR (TAMIFLU) 75 MG CAPSULE    One twice daily for influenza     Physical Exam: Filed Vitals:   04/24/13 1341  BP: 124/70  Pulse: 76  Temp: 97.9 F (36.6 C)  TempSrc: Oral  Height: 5\' 10"  (1.778 m)  Weight: 179 lb 3.2 oz (81.285 kg)  SpO2: 97%  Physical Exam  Constitutional: He is oriented to person, place, and time. He appears well-developed and well-nourished. No distress.  HENT:  Head: Normocephalic and atraumatic.  Right Ear: External ear normal.  Left Ear: External ear normal.  Nose: Nose normal.  Mouth/Throat: Oropharynx is clear and moist. No oropharyngeal exudate.  Eyes: Conjunctivae and EOM are normal. Pupils are equal, round, and reactive to light.  Blind in one eye  Neck: Normal range of motion. Neck supple. No JVD present. No thyromegaly present.  Cardiovascular: Normal rate, regular rhythm, normal heart sounds and intact distal pulses.   No  murmur heard. Pulmonary/Chest: Effort normal and breath sounds normal. No respiratory distress. He has no wheezes.  Abdominal: Soft. Bowel sounds are normal. He exhibits no distension and no mass. There is no tenderness.  Musculoskeletal: Normal range of motion. He exhibits no edema and no tenderness.  Lymphadenopathy:  He has no cervical adenopathy.  Neurological: He is alert and oriented to person, place, and time. He has normal reflexes. No cranial nerve deficit.  Skin: Skin is warm and dry.  Psychiatric: He has a normal mood and affect.  Talks a lot, tells jokes    Labs reviewed: CBC:  Recent Labs  04/11/13 1547  WBC 7.3  NEUTROABS 3.3  HGB 14.9  HCT 43.0  MCV 90  PLT 199   Lab Results  Component Value Date   HGBA1C  Value: 6.1 (NOTE)   The ADA recommends the following therapeutic goal for glycemic   control related to Hgb A1C measurement:   Goal of Therapy:   < 7.0% Hgb A1C   Reference: American Diabetes Association: Clinical Practice   Recommendations 2008, Diabetes Care,  2008, 31:(Suppl 1). 02/21/2008  Labs from Dr. Elyse Hsu: hba1c 7.3 Tc 219, HDL 61, TG 117, LDL 134 Glucose 121, BUN 15, cr 1.17, Ca 9.2, transaminases normal, Na 139, K 4.2  Past Procedures: Due for cscope  Assessment/Plan General medical exam/preventive care: Refuses pneumovax,  flu vaccines. Tdap, zostavax.  Was convinced to get prevnar. Due for his cscope  Need documentation of diabetic foot exam, urine microalbumin, ( done at endocrine) and hba1c was at goal with Dr. Elyse Hsu Requests his PSA be done No desire to quit smoking Denies depression MMSE wnl No falls lately  1. Spinal stenosis, lumbar region, with neurogenic claudication -unchanged, continue hydrocodone rarely as he takes, will talk more about this at future visits--need records from prior physician  2. Tobacco use disorder -given prevnar vaccine today -has no plans to quit  3. Acute bronchitis -resolved  4. Benign  essential hypertension -bp satisfactory with current therapy  5. DM (diabetes mellitus) type II uncontrolled with eye manifestation, with macular edema, with proliferative retinopathy -glucose at goal with good readings upon review of glucometer and hba1c from Dr. Elyse Hsu -cont Celesta Gentile and actos  6. Osteoporosis due to androgen therapy, also from smoking - not currently on any meds for this  7.  Hypogonadism F/u testosterone levels and psa so we can determine if he needs continued testosterone injections  Labs/tests ordered:   Orders Placed This Encounter  Procedures  . Pneumococcal conjugate vaccine 13-valent  . Testosterone, Free, Total  . PSA   Next appt:  3 mos

## 2013-04-27 LAB — TESTOSTERONE, FREE, TOTAL, SHBG
Testosterone, Free: 5 pg/mL — ABNORMAL LOW (ref 6.6–18.1)
Testosterone, total: 421.4 ng/dL (ref 348.0–1197.0)

## 2013-04-27 LAB — PSA: PSA: 0.6 ng/mL (ref 0.0–4.0)

## 2013-05-02 ENCOUNTER — Encounter: Payer: Self-pay | Admitting: *Deleted

## 2013-06-12 ENCOUNTER — Encounter (INDEPENDENT_AMBULATORY_CARE_PROVIDER_SITE_OTHER): Payer: No Typology Code available for payment source | Admitting: Ophthalmology

## 2013-06-12 DIAGNOSIS — H35039 Hypertensive retinopathy, unspecified eye: Secondary | ICD-10-CM

## 2013-06-12 DIAGNOSIS — H3530 Unspecified macular degeneration: Secondary | ICD-10-CM

## 2013-06-12 DIAGNOSIS — H353290 Exudative age-related macular degeneration, unspecified eye, stage unspecified: Secondary | ICD-10-CM

## 2013-06-12 DIAGNOSIS — I10 Essential (primary) hypertension: Secondary | ICD-10-CM

## 2013-06-12 DIAGNOSIS — H43819 Vitreous degeneration, unspecified eye: Secondary | ICD-10-CM

## 2013-07-12 ENCOUNTER — Encounter: Payer: Self-pay | Admitting: Internal Medicine

## 2013-07-17 ENCOUNTER — Emergency Department (HOSPITAL_COMMUNITY)
Admission: EM | Admit: 2013-07-17 | Discharge: 2013-07-17 | Disposition: A | Discharge status: Home / Self Care | Payer: Medicare Other | Attending: Emergency Medicine | Admitting: Emergency Medicine

## 2013-07-17 ENCOUNTER — Encounter (HOSPITAL_COMMUNITY): Payer: Self-pay | Admitting: Emergency Medicine

## 2013-07-17 ENCOUNTER — Emergency Department (HOSPITAL_COMMUNITY): Payer: Medicare Other

## 2013-07-17 DIAGNOSIS — S99929A Unspecified injury of unspecified foot, initial encounter: Secondary | ICD-10-CM

## 2013-07-17 DIAGNOSIS — S1093XA Contusion of unspecified part of neck, initial encounter: Principal | ICD-10-CM

## 2013-07-17 DIAGNOSIS — Z79899 Other long term (current) drug therapy: Secondary | ICD-10-CM | POA: Insufficient documentation

## 2013-07-17 DIAGNOSIS — S8990XA Unspecified injury of unspecified lower leg, initial encounter: Secondary | ICD-10-CM | POA: Insufficient documentation

## 2013-07-17 DIAGNOSIS — S4980XA Other specified injuries of shoulder and upper arm, unspecified arm, initial encounter: Secondary | ICD-10-CM | POA: Insufficient documentation

## 2013-07-17 DIAGNOSIS — N400 Enlarged prostate without lower urinary tract symptoms: Secondary | ICD-10-CM | POA: Insufficient documentation

## 2013-07-17 DIAGNOSIS — Z8739 Personal history of other diseases of the musculoskeletal system and connective tissue: Secondary | ICD-10-CM | POA: Insufficient documentation

## 2013-07-17 DIAGNOSIS — Y9389 Activity, other specified: Secondary | ICD-10-CM | POA: Insufficient documentation

## 2013-07-17 DIAGNOSIS — E11319 Type 2 diabetes mellitus with unspecified diabetic retinopathy without macular edema: Secondary | ICD-10-CM | POA: Insufficient documentation

## 2013-07-17 DIAGNOSIS — E785 Hyperlipidemia, unspecified: Secondary | ICD-10-CM | POA: Insufficient documentation

## 2013-07-17 DIAGNOSIS — S0083XA Contusion of other part of head, initial encounter: Principal | ICD-10-CM

## 2013-07-17 DIAGNOSIS — E1139 Type 2 diabetes mellitus with other diabetic ophthalmic complication: Secondary | ICD-10-CM | POA: Insufficient documentation

## 2013-07-17 DIAGNOSIS — S99919A Unspecified injury of unspecified ankle, initial encounter: Secondary | ICD-10-CM

## 2013-07-17 DIAGNOSIS — S4992XA Unspecified injury of left shoulder and upper arm, initial encounter: Secondary | ICD-10-CM

## 2013-07-17 DIAGNOSIS — I10 Essential (primary) hypertension: Secondary | ICD-10-CM | POA: Insufficient documentation

## 2013-07-17 DIAGNOSIS — S46909A Unspecified injury of unspecified muscle, fascia and tendon at shoulder and upper arm level, unspecified arm, initial encounter: Secondary | ICD-10-CM | POA: Insufficient documentation

## 2013-07-17 DIAGNOSIS — W1809XA Striking against other object with subsequent fall, initial encounter: Secondary | ICD-10-CM | POA: Insufficient documentation

## 2013-07-17 DIAGNOSIS — E1165 Type 2 diabetes mellitus with hyperglycemia: Secondary | ICD-10-CM

## 2013-07-17 DIAGNOSIS — F17200 Nicotine dependence, unspecified, uncomplicated: Secondary | ICD-10-CM | POA: Insufficient documentation

## 2013-07-17 DIAGNOSIS — W010XXA Fall on same level from slipping, tripping and stumbling without subsequent striking against object, initial encounter: Secondary | ICD-10-CM | POA: Insufficient documentation

## 2013-07-17 DIAGNOSIS — S0003XA Contusion of scalp, initial encounter: Secondary | ICD-10-CM | POA: Insufficient documentation

## 2013-07-17 DIAGNOSIS — Y929 Unspecified place or not applicable: Secondary | ICD-10-CM | POA: Insufficient documentation

## 2013-07-17 MED ORDER — HYDROCODONE-ACETAMINOPHEN 5-325 MG PO TABS: 1.0000 | ORAL_TABLET | Freq: Four times a day (QID) | ORAL | Status: DC | PRN

## 2013-07-17 NOTE — ED Provider Notes (Signed)
CSN: [id number]     Arrival date & time 07/17/13  1447 History   First MD Initiated Contact with Patient 07/17/13 2013     Chief Complaint  Patient presents with  . Fall     (Consider location/radiation/quality/duration/timing/severity/associated sxs/prior Treatment) Patient is a 73 y.o. male presenting with fall. The history is provided by the patient.  Fall Pertinent negatives include no chest pain, no abdominal pain, no headaches and no shortness of breath.   patient status post fall on Friday 3 days ago tripped no loss of consciousness no syncope when on his left shoulder on concrete. Injuring his left shoulder did hit his left for head. Also with some mild discomfort to right knee and right ankle. Patient's main complaint is the left shoulder which is now very sore really has no range of motion of the shoulder at all. No headaches no nausea no vomiting no neck pain no back pain no chest pain no bowel pain. Left shoulder pain is 10 out of 10 with movement. With the fall the arm was against his chest body. Was not reached out. Left shoulder pain is made worse by any movement at the shoulder.  Past Medical History  Diagnosis Date  . Macular degeneration   . Hyperlipidemia LDL goal < 100   . Benign essential hypertension   . DM (diabetes mellitus) type II uncontrolled with eye manifestation   . Osteoporosis due to androgen therapy   . Low serum testosterone level   . BPH (benign prostatic hyperplasia)   . Blepharitis, squamous     Right Eye    Past Surgical History  Procedure Laterality Date  . Back surgery  1998    Dr.Nitka  . Refractive surgery  09/2005    Macular Degeneration Dr.Mathews   . Back surgery  03/2006    Removed Rods Dr.Nitka   . Carpal tunnel release  08/2006  . Cataract extraction  02/21/2007    Dr.Epps   . Kidney stone surgery  08/11/2007    Dr.Tannenbaum   . Carpal tunnel release  08/2006    Dr.Nitka   . Lithotripsy  08/11/2007    Dr.Nesi  . Eye surgery   2013    Right eye every 8 weeks    Family History  Problem Relation Age of Onset  . Colon cancer Father   . Alzheimer's disease Mother   . Diabetes Sister    History  Substance Use Topics  . Smoking status: Current Every Day Smoker -- 1.50 packs/day for 59 years    Types: Cigarettes  . Smokeless tobacco: Not on file  . Alcohol Use: No    Review of Systems  Constitutional: Negative for fever.  HENT: Negative for congestion.   Eyes: Positive for visual disturbance.  Respiratory: Negative for shortness of breath.   Cardiovascular: Negative for chest pain.  Gastrointestinal: Negative for abdominal pain.  Musculoskeletal: Negative for back pain and neck pain.  Skin: Negative for rash.  Neurological: Negative for headaches.  Hematological: Does not bruise/bleed easily.  Psychiatric/Behavioral: Negative for confusion.      Allergies  Bee pollen; Tape; Flurox; Metformin and related; Morphine and related; and Statins  Home Medications   Current Outpatient Rx  Name  Route  Sig  Dispense  Refill  . diazepam (VALIUM) 5 MG tablet   Oral   Take 5 mg by mouth. 1 by mouth on the day of eye surgery ONCE every 10 weeks  Dr.[name]         .  Diclofenac Sodium (PENNSAID TD)   Transdermal   Place onto the skin as needed.         . finasteride (PROSCAR) 5 MG tablet   Oral   Take 5 mg by mouth daily.         . finasteride (PROSCAR) 5 MG tablet   Oral   Take 5 mg by mouth daily.         Marland Kitchen gabapentin (NEURONTIN) 600 MG tablet   Oral   Take 600 mg by mouth 3 (three) times daily.         Marland Kitchen HYDROcodone-acetaminophen (NORCO/VICODIN) 5-325 MG per tablet   Oral   Take 1 tablet by mouth. 1 by mouth every 10 weeks prior to eye surgery (30 min prior to surgery)         . Multiple Vitamins-Minerals (PRESERVISION AREDS PO)   Oral   Take 1 tablet by mouth daily.         . pioglitazone (ACTOS) 30 MG tablet   Oral   Take 30 mg by mouth daily. Dr.[name]         .  Polyethyl Glycol-Propyl Glycol (SYSTANE OP)   Ophthalmic   Apply to eye as needed.         . sitaGLIPtin (JANUVIA) 100 MG tablet   Oral   Take 100 mg by mouth daily.         Marland Kitchen venlafaxine XR (EFFEXOR-XR) 75 MG 24 hr capsule   Oral   Take 75 mg by mouth daily with breakfast.         . Vitamin D, Cholecalciferol, 1000 UNITS TABS   Oral   Take 2,000 Units by mouth daily.   30 tablet   4   . HYDROcodone-acetaminophen (NORCO/VICODIN) 5-325 MG per tablet   Oral   Take 1-2 tablets by mouth every 6 (six) hours as needed for moderate pain.   14 tablet   0    BP 134/61  Pulse 63  Temp(Src) 98.3 F (36.8 C) (Oral)  Resp 18  Ht 5\' 10"  (1.778 m)  Wt 175 lb (79.379 kg)  BMI 25.11 kg/m2  SpO2 98% Physical Exam  Nursing note and vitals reviewed. Constitutional: He is oriented to person, place, and time. He appears well-developed and well-nourished. No distress.  HENT:  Head: Normocephalic.  Mouth/Throat: Oropharynx is clear and moist.  Small contusion to the left for head measuring about 1 x 1 cm. No significant abrasion no bleeding.  Neck: Normal range of motion. Neck supple.  Cardiovascular: Normal rate, regular rhythm and normal heart sounds.   Pulmonary/Chest: Effort normal and breath sounds normal. No respiratory distress.  Abdominal: Soft. Bowel sounds are normal. There is no tenderness.  Musculoskeletal: Normal range of motion. He exhibits tenderness.  Tenderness to palpation to the proximal humerus on the left side. No deformity radial pulse distally is 2+ sensation is intact good range of motion at the wrist fingers and elbow. No range of motion at the left shoulder should moves at all he gets severe pain.  Neurological: He is alert and oriented to person, place, and time. No cranial nerve deficit. He exhibits normal muscle tone. Coordination normal.  Skin: Skin is warm. No rash noted.    ED Course  Procedures (including critical care time) Labs Review Labs  Reviewed - No data to display Imaging Review Dg Shoulder Left  07/17/2013   CLINICAL DATA:  Fall.  EXAM: LEFT SHOULDER - 2+ VIEW  COMPARISON:  None.  FINDINGS:  Degenerative change. No acute bony or joint abnormality. L left rib fractures.  IMPRESSION: Negative.   Electronically Signed   By: Marcello Moores  Register   On: 07/17/2013 16:39     EKG Interpretation None      MDM   Final diagnoses:  Injury of shoulder, left    Patient status post fall on Friday 3 days ago. No loss of consciousness. Patient tripped. No syncope. Patient landed on his left shoulder arm was against his body. Therefore is unlikely that he did a significant tear to the rotator cuff. But not ruled out. X-rays of the left shoulder are normal. Patient also has some mild discomfort to his right ankle and right knee no significant problems there. Patient also did hit his left for head as stated no loss of consciousness. No significant headaches. Denies any neck or back injury denies any abdominal pain or shortness of breath. Patient will be treated with a shoulder immobilizer for the left shoulder. And referral to orthopedics. We treated with hydrocodone for pain.    Mervin Kung, MD 07/17/13 [phone]

## 2013-07-17 NOTE — Discharge Instructions (Signed)
Injury to the left shoulder without any obvious bony injury. You shoulder immobilizer for comfort. Make appointment to followup with orthopedics. Take hydrocodone as needed for pain. Return for any new or worse symptoms.

## 2013-07-17 NOTE — ED Notes (Signed)
Ortho paged. 

## 2013-07-17 NOTE — ED Notes (Signed)
Pt reports that he tripped and fell on concrete on Friday. Pt fell injuring L shoulder, side of face and top of head, R knee and R ankle. Hx bursa is bilateral shoulders. Pt states that he can not lift arm. Pms intact. No loc.

## 2013-07-31 ENCOUNTER — Ambulatory Visit (INDEPENDENT_AMBULATORY_CARE_PROVIDER_SITE_OTHER): Payer: No Typology Code available for payment source | Admitting: Internal Medicine

## 2013-07-31 ENCOUNTER — Encounter: Payer: Self-pay | Admitting: Internal Medicine

## 2013-07-31 VITALS — BP 134/78 | HR 96 | Temp 98.3°F | Resp 10 | Wt 174.0 lb

## 2013-07-31 DIAGNOSIS — E785 Hyperlipidemia, unspecified: Secondary | ICD-10-CM

## 2013-07-31 DIAGNOSIS — I10 Essential (primary) hypertension: Secondary | ICD-10-CM

## 2013-07-31 DIAGNOSIS — IMO0002 Reserved for concepts with insufficient information to code with codable children: Secondary | ICD-10-CM

## 2013-07-31 DIAGNOSIS — M818 Other osteoporosis without current pathological fracture: Secondary | ICD-10-CM

## 2013-07-31 DIAGNOSIS — E1165 Type 2 diabetes mellitus with hyperglycemia: Secondary | ICD-10-CM

## 2013-07-31 DIAGNOSIS — E1139 Type 2 diabetes mellitus with other diabetic ophthalmic complication: Secondary | ICD-10-CM

## 2013-07-31 DIAGNOSIS — F17200 Nicotine dependence, unspecified, uncomplicated: Secondary | ICD-10-CM

## 2013-07-31 DIAGNOSIS — M25512 Pain in left shoulder: Secondary | ICD-10-CM | POA: Insufficient documentation

## 2013-07-31 DIAGNOSIS — M25519 Pain in unspecified shoulder: Secondary | ICD-10-CM

## 2013-07-31 DIAGNOSIS — E291 Testicular hypofunction: Secondary | ICD-10-CM

## 2013-07-31 DIAGNOSIS — K259 Gastric ulcer, unspecified as acute or chronic, without hemorrhage or perforation: Secondary | ICD-10-CM

## 2013-07-31 DIAGNOSIS — T387X5A Adverse effect of androgens and anabolic congeners, initial encounter: Secondary | ICD-10-CM

## 2013-07-31 MED ORDER — VITAMIN D (ERGOCALCIFEROL) 1.25 MG (50000 UNIT) PO CAPS: 50000.0000 [IU] | ORAL_CAPSULE | ORAL | Status: DC

## 2013-07-31 NOTE — Progress Notes (Signed)
Patient ID: DELANO FRATE, male   DOB: 1940/08/09, 73 y.o.   MRN: [id number]   Location:  [facility]  Code Status:   Allergies  Allergen Reactions  . Bee Pollen Anaphylaxis  . Tape     Any medical tape, Rash   . Flurox [Fluorescein-Benoxinate]     Rash   . Metformin And Related     Severe Diarrhea   . Morphine And Related     Kidney failure   . Statins     Loss of appetite    Chief Complaint  Patient presents with  . Medical Managment of Chronic Issues    3 month follow-up   . Hospitalization Follow-up    ER follow-up, injured left shoulder/arm     HPI: Patient is a 73 y.o. white male seen in the office today for f/u on ER visit.  He tripped and landed on his left shoulder, right wrist, skinned his right knee, skinned left side of his face on concrete.  Was carrying the dollar tree items on his left side which has previously been his better side. Is set up for MRI of left shoulder--Dr. Louanne Skye has evaluated him.  Pt is now wearing a sling.  Pain in shoulder remains--cannot lift the left arm even passively, it is excruciating.  Cannot sleep comfortably at night.  Dr. Louanne Skye refilled his hydrocodone for him (the ones he had were 73 years old).  Last pill taken was fri am, rested all weekend as recommended.    He is not fasting.  His glucose was 119.    Has upcoming appt with Dr. Zigmund Daniel for his retinopathy.   Has cscope coming up 08/11/13 with Dr. Benson Norway.     Review of Systems:  Review of Systems  Constitutional: Positive for malaise/fatigue. Negative for fever and weight loss.  HENT: Negative for congestion.   Eyes: Positive for blurred vision.       Is legally blind with retinopathy  Respiratory: Negative for shortness of breath and wheezing.   Cardiovascular: Negative for chest pain and leg swelling.  Gastrointestinal: Negative for abdominal pain, blood in stool and melena.  Genitourinary: Negative for dysuria.  Musculoskeletal:  Positive for falls. Negative for myalgias.  Skin: Negative for rash.  Neurological: Positive for dizziness. Negative for loss of consciousness, weakness and headaches.  Endo/Heme/Allergies: Bruises/bleeds easily.  Psychiatric/Behavioral: Positive for depression and memory loss.    Past Medical History  Diagnosis Date  . Macular degeneration   . Hyperlipidemia LDL goal < 100   . Benign essential hypertension   . DM (diabetes mellitus) type II uncontrolled with eye manifestation   . Osteoporosis due to androgen therapy   . Low serum testosterone level   . BPH (benign prostatic hyperplasia)   . Blepharitis, squamous     Right Eye     Past Surgical History  Procedure Laterality Date  . Back surgery  1998    Dr.Nitka  . Refractive surgery  09/2005    Macular Degeneration Dr.Mathews   . Back surgery  03/2006    Removed Rods Dr.Nitka   . Carpal tunnel release  08/2006  . Cataract extraction  02/21/2007    Dr.Epps   . Kidney stone surgery  08/11/2007    Dr.Tannenbaum   . Carpal tunnel release  08/2006    Dr.Nitka   . Lithotripsy  08/11/2007    Dr.Nesi  . Eye surgery  2013    Right eye every 8 weeks  Social History:   reports that he has been smoking Cigarettes.  He has a 73 pack-year smoking history. He does not have any smokeless tobacco history on file. He reports that he does not drink alcohol or use illicit drugs.  Family History  Problem Relation Age of Onset  . Colon cancer Father   . Alzheimer's disease Mother   . Diabetes Sister     Medications: Patient's Medications  New Prescriptions   No medications on file  Previous Medications   DIAZEPAM (VALIUM) 5 MG TABLET    Take 5 mg by mouth. 1 by mouth on the day of eye surgery ONCE every 10 weeks  Dr.[name]   FINASTERIDE (PROSCAR) 5 MG TABLET    Take 5 mg by mouth daily.   GABAPENTIN (NEURONTIN) 600 MG TABLET    Take 600 mg by mouth 3 (three) times daily.   HYDROCODONE-ACETAMINOPHEN (NORCO/VICODIN) 5-325  MG PER TABLET    Take 1 tablet by mouth. 1 by mouth every 10 weeks prior to eye surgery (30 min prior to surgery)   MOXIFLOXACIN (VIGAMOX) 0.5 % OPHTHALMIC SOLUTION    1 drop 3 (three) times daily.   MULTIPLE VITAMINS-MINERALS (PRESERVISION AREDS PO)    Take 1 tablet by mouth daily.   PIOGLITAZONE (ACTOS) 30 MG TABLET    Take 30 mg by mouth daily. Dr.[name]   POLYETHYL GLYCOL-PROPYL GLYCOL (SYSTANE OP)    Apply to eye as needed.   SITAGLIPTIN (JANUVIA) 100 MG TABLET    Take 100 mg by mouth daily.   TAMSULOSIN (FLOMAX) 0.4 MG CAPS CAPSULE    Take 0.4 mg by mouth daily.   TESTOSTERONE ENANTHATE (DELATESTRYL) 200 MG/ML INJECTION    Inject into the muscle every 28 (twenty-eight) days. For IM use only   VENLAFAXINE XR (EFFEXOR-XR) 75 MG 24 HR CAPSULE    Take 75 mg by mouth daily with breakfast.   VITAMIN D, CHOLECALCIFEROL, 1000 UNITS TABS    Take 2,000 Units by mouth daily.  Modified Medications   No medications on file  Discontinued Medications   DICLOFENAC SODIUM (PENNSAID TD)    Place onto the skin as needed.   FINASTERIDE (PROSCAR) 5 MG TABLET    Take 5 mg by mouth daily.   HYDROCODONE-ACETAMINOPHEN (NORCO/VICODIN) 5-325 MG PER TABLET    Take 1-2 tablets by mouth every 6 (six) hours as needed for moderate pain.     Physical Exam: Filed Vitals:   07/31/13 0816  BP: 134/78  Pulse: 96  Temp: 98.3 F (36.8 C)  TempSrc: Oral  Resp: 10  Weight: 174 lb (78.926 kg)  SpO2: 94%  Physical Exam  Constitutional: He is oriented to person, place, and time. No distress.  Frail white male   HENT:  Head: Normocephalic and atraumatic.  Eyes:  Wears dark glasses or closes left eye  Cardiovascular: Normal rate, regular rhythm, normal heart sounds and intact distal pulses.   Pulmonary/Chest: Effort normal and breath sounds normal. No respiratory distress.  Abdominal: Soft. Bowel sounds are normal. He exhibits no distension and no mass. There is no tenderness.  Musculoskeletal: He exhibits no  edema.  Left shoulder tender over entire rotator cuff and lateral humerus, wearing his sling, also tender in low back with decreased ROM  Neurological: He is alert and oriented to person, place, and time.  Skin: Skin is warm and dry.  Psychiatric: He has a normal mood and affect.  Jokes around    Labs reviewed:  CBC:  Recent Labs  04/11/13  1547  WBC 7.3  NEUTROABS 3.3  HGB 14.9  HCT 43.0  MCV 90  PLT 199   Lab Results  Component Value Date   HGBA1C  Value: 6.1 (NOTE)   The ADA recommends the following therapeutic goal for glycemic   control related to Hgb A1C measurement:   Goal of Therapy:   < 7.0% Hgb A1C   Reference: American Diabetes Association: Clinical Practice   Recommendations 2008, Diabetes Care,  2008, 31:(Suppl 1). 02/21/2008    Past Procedures: Left shoulder xray:  Degenerative changes, no acute bony or joint abnormality, left rib fractures.  Assessment/Plan 1. Left shoulder pain -get MRI per Dr. Louanne Skye as planned -cont hydrocodone as needed for severe pain -remains severe at times and ROM is limited  2. Benign essential hypertension -bp satisfactory given his age and fall risk  3. Gastric ulcer -no more melena since several years ago -has f/u cscope planned with Dr. Benson Norway 08/11/13  4. DM (diabetes mellitus) type II uncontrolled with eye manifestation -will f/u hba1c today -cont actos, januvia -toenails are getting long--referred to podiatry  5. Osteoporosis due to androgen therapy -requests monthly testosterone injections be resumed--level was low in January -cont vitamin D monthly and daily supplement -will give first injection here today per his request 100mg /15ml--1ml given  6. Hyperlipidemia LDL goal < 100 -check lipids next time -is not fasting, has refused medication for that  7. Tobacco use disorder -continues to smoke w/o any desire to quit  Had prevnar in 1/15, but must wait until 1/16 for his pneumovax  Labs/tests ordered:   Orders  Placed This Encounter  Procedures  . Hemoglobin A1c  . CBC With differential/Platelet  . Comprehensive metabolic panel  . Microalbumin/Creatinine Ratio, Urine   Next appt:  3 mos--come fasting for cholesterol that day

## 2013-08-01 ENCOUNTER — Encounter: Payer: Self-pay | Admitting: *Deleted

## 2013-08-01 LAB — CBC WITH DIFFERENTIAL
Basophils Absolute: 0.1 10*3/uL (ref 0.0–0.2)
Basos: 1 %
Eos: 1 %
Eosinophils Absolute: 0.1 10*3/uL (ref 0.0–0.4)
HCT: 44.4 % (ref 37.5–51.0)
Hemoglobin: 15.1 g/dL (ref 12.6–17.7)
Immature Grans (Abs): 0 10*3/uL (ref 0.0–0.1)
Immature Granulocytes: 0 %
Lymphocytes Absolute: 2.3 10*3/uL (ref 0.7–3.1)
Lymphs: 31 %
MCH: 30.9 pg (ref 26.6–33.0)
MCHC: 34 g/dL (ref 31.5–35.7)
MCV: 91 fL (ref 79–97)
Monocytes Absolute: 0.6 10*3/uL (ref 0.1–0.9)
Monocytes: 8 %
Neutrophils Absolute: 4.4 10*3/uL (ref 1.4–7.0)
Neutrophils Relative %: 59 %
Platelets: 237 10*3/uL (ref 150–379)
RBC: 4.88 x10E6/uL (ref 4.14–5.80)
RDW: 13.7 % (ref 12.3–15.4)
WBC: 7.5 10*3/uL (ref 3.4–10.8)

## 2013-08-01 LAB — COMPREHENSIVE METABOLIC PANEL
ALT: 9 IU/L (ref 0–44)
AST: 15 IU/L (ref 0–40)
Albumin/Globulin Ratio: 1.4 (ref 1.1–2.5)
Albumin: 4.4 g/dL (ref 3.5–4.8)
Alkaline Phosphatase: 55 IU/L (ref 39–117)
BUN/Creatinine Ratio: 13 (ref 10–22)
BUN: 18 mg/dL (ref 8–27)
CO2: 25 mmol/L (ref 18–29)
Calcium: 9.4 mg/dL (ref 8.6–10.2)
Chloride: 97 mmol/L (ref 97–108)
Creatinine, Ser: 1.36 mg/dL — ABNORMAL HIGH (ref 0.76–1.27)
GFR calc Af Amer: 60 mL/min/{1.73_m2} (ref >59)
GFR calc non Af Amer: 52 mL/min/{1.73_m2} — ABNORMAL LOW (ref >59)
Globulin, Total: 3.1 g/dL (ref 1.5–4.5)
Glucose: 132 mg/dL — ABNORMAL HIGH (ref 65–99)
Potassium: 3.7 mmol/L (ref 3.5–5.2)
Sodium: 139 mmol/L (ref 134–144)
Total Bilirubin: 0.4 mg/dL (ref 0.0–1.2)
Total Protein: 7.5 g/dL (ref 6.0–8.5)

## 2013-08-01 LAB — HEMOGLOBIN A1C
Est. average glucose Bld gHb Est-mCnc: 151 mg/dL
Hgb A1c MFr Bld: 6.9 % — ABNORMAL HIGH (ref 4.8–5.6)

## 2013-08-02 ENCOUNTER — Other Ambulatory Visit: Payer: Self-pay | Admitting: *Deleted

## 2013-08-02 DIAGNOSIS — T387X5A Adverse effect of androgens and anabolic congeners, initial encounter: Principal | ICD-10-CM

## 2013-08-02 DIAGNOSIS — F17200 Nicotine dependence, unspecified, uncomplicated: Secondary | ICD-10-CM

## 2013-08-02 DIAGNOSIS — M818 Other osteoporosis without current pathological fracture: Secondary | ICD-10-CM

## 2013-08-02 MED ORDER — TESTOSTERONE CYPIONATE 100 MG/ML IM SOLN: 100.0000 mg | INTRAMUSCULAR | Status: DC

## 2013-08-02 MED ORDER — VITAMIN D (ERGOCALCIFEROL) 1.25 MG (50000 UNIT) PO CAPS: 50000.0000 [IU] | ORAL_CAPSULE | ORAL | Status: DC

## 2013-08-02 NOTE — Telephone Encounter (Signed)
Patient Requested 

## 2013-08-03 LAB — MICROALBUMIN / CREATININE URINE RATIO
Creatinine, Ur: 186.5 mg/dL (ref 22.0–328.0)
MICROALB/CREAT RATIO: 2.1 mg/g creat (ref 0.0–30.0)
Microalbumin, Urine: 4 ug/mL (ref 0.0–17.0)

## 2013-08-04 ENCOUNTER — Telehealth: Payer: Self-pay | Admitting: *Deleted

## 2013-08-04 ENCOUNTER — Encounter: Payer: Self-pay | Admitting: *Deleted

## 2013-08-04 NOTE — Telephone Encounter (Signed)
Prior Auth done for Testosterone Injection # [id number] Spoke with Anise Salvo. Medication Approved till 04/12/2014 ID #  [id number] [facility] Notified.

## 2013-08-18 ENCOUNTER — Encounter (HOSPITAL_COMMUNITY): Payer: Self-pay | Admitting: Pharmacy Technician

## 2013-08-18 ENCOUNTER — Other Ambulatory Visit (HOSPITAL_COMMUNITY): Payer: Self-pay | Admitting: Orthopedic Surgery

## 2013-08-19 NOTE — Pre-Procedure Instructions (Signed)
[facility] - Preparing for Surgery  Before surgery, you can play an important role.  Because skin is not sterile, your skin needs to be as free of germs as possible.  You can reduce the number of germs on you skin by washing with CHG (chlorahexidine gluconate) soap before surgery.  CHG is an antiseptic cleaner which kills germs and bonds with the skin to continue killing germs even after washing.  Please DO NOT use if you have an allergy to CHG or antibacterial soaps.  If your skin becomes reddened/irritated stop using the CHG and inform your nurse when you arrive at Short Stay.  Do not shave (including legs and underarms) for at least 48 hours prior to the first CHG shower.  You may shave your face.  Please follow these instructions carefully:   1.  Shower with CHG Soap the night before surgery and the morning of Surgery.  2.  If you choose to wash your hair, wash your hair first as usual with your normal shampoo.  3.  After you shampoo, rinse your hair and body thoroughly to remove the shampoo.  4.  Use CHG as you would any other liquid soap.  You can apply CHG directly to the skin and wash gently with scrungie or a clean washcloth.  5.  Apply the CHG Soap to your body ONLY FROM THE NECK DOWN.  Do not use on open wounds or open sores.  Avoid contact with your eyes, ears, mouth and genitals (private parts).  Wash genitals (private parts) with your normal soap.  6.  Wash thoroughly, paying special attention to the area where your surgery will be performed.  7.  Thoroughly rinse your body with warm water from the neck down.  8.  DO NOT shower/wash with your normal soap after using and rinsing off the CHG Soap.  9.  Pat yourself dry with a clean towel.            10.  Wear clean pajamas.            11.  Place clean sheets on your bed the night of your first shower and do not sleep with pets.  Day of Surgery  Do not apply any lotions the morning of surgery.  Please wear clean clothes to the  hospital/surgery center.   

## 2013-08-19 NOTE — Pre-Procedure Instructions (Signed)
Scott Willis  08/19/2013   Your procedure is scheduled on:  May 12  Report to [facility] Admitting at 05:30 AM.  Call this number if you have problems the morning of surgery: [phone]   Remember:   Do not eat food or drink liquids after midnight.   Take these medicines the morning of surgery with A SIP OF WATER: Gabapentin, Hydrocodone (if needed), Eye drops, flomax,Effexor,   STOP Preservision and Vitamin D Today   STOP/ Do not take Aspirin, Aleve, Naproxen, Advil, Ibuprofen, Vitamin, Herbs, or Supplements starting today   Do not wear jewelry, make-up or nail polish.  Do not wear lotions, powders, or perfumes. You may wear deodorant.  Do not shave 48 hours prior to surgery. Men may shave face and neck.  Do not bring valuables to the hospital.  [facility] is not responsible for any belongings or valuables.               Contacts, dentures or bridgework may not be worn into surgery.  Leave suitcase in the car. After surgery it may be brought to your room.  For patients admitted to the hospital, discharge time is determined by your treatment team.               Patients discharged the day of surgery will not be allowed to drive home.  Name and phone number of your driver: Family/ Friend  Special Instructions: See [facility] Preparing For Surgery   Please read over the following fact sheets that you were given: Pain Booklet, Coughing and Deep Breathing and Surgical Site Infection Prevention

## 2013-08-21 ENCOUNTER — Encounter (HOSPITAL_COMMUNITY)
Admission: RE | Admit: 2013-08-21 | Discharge: 2013-08-21 | Disposition: A | Discharge status: Home / Self Care | Payer: Medicare Other | Source: Ambulatory Visit | Attending: Orthopedic Surgery | Admitting: Orthopedic Surgery

## 2013-08-21 ENCOUNTER — Encounter (HOSPITAL_COMMUNITY): Payer: Self-pay

## 2013-08-21 HISTORY — DX: Unspecified visual loss: H54.7

## 2013-08-21 HISTORY — DX: Calculus of kidney: N20.0

## 2013-08-21 HISTORY — DX: Unspecified osteoarthritis, unspecified site: M19.90

## 2013-08-21 LAB — CBC
HCT: 41.5 % (ref 39.0–52.0)
Hemoglobin: 13.4 g/dL (ref 13.0–17.0)
MCH: 30.5 pg (ref 26.0–34.0)
MCHC: 32.3 g/dL (ref 30.0–36.0)
MCV: 94.5 fL (ref 78.0–100.0)
Platelets: 188 10*3/uL (ref 150–400)
RBC: 4.39 MIL/uL (ref 4.22–5.81)
RDW: 13 % (ref 11.5–15.5)
WBC: 6.5 10*3/uL (ref 4.0–10.5)

## 2013-08-21 LAB — BASIC METABOLIC PANEL
BUN: 16 mg/dL (ref 6–23)
CO2: 29 mEq/L (ref 19–32)
Calcium: 9.2 mg/dL (ref 8.4–10.5)
Chloride: 100 mEq/L (ref 96–112)
Creatinine, Ser: 1.55 mg/dL — ABNORMAL HIGH (ref 0.50–1.35)
GFR calc Af Amer: 50 mL/min — ABNORMAL LOW (ref >90)
GFR calc non Af Amer: 43 mL/min — ABNORMAL LOW (ref >90)
Glucose, Bld: 111 mg/dL — ABNORMAL HIGH (ref 70–99)
Potassium: 4.1 mEq/L (ref 3.7–5.3)
Sodium: 140 mEq/L (ref 137–147)

## 2013-08-21 MED ORDER — CHLORHEXIDINE GLUCONATE 4 % EX LIQD
60.0000 mL | Freq: Once | CUTANEOUS | Status: DC
  Filled 2013-08-21: qty 60

## 2013-08-21 MED ORDER — CEFAZOLIN SODIUM-DEXTROSE 2-3 GM-% IV SOLR
2.0000 g | INTRAVENOUS | Status: AC
  Administered 2013-08-22: 2 g via INTRAVENOUS
  Filled 2013-08-21: qty 50

## 2013-08-21 NOTE — Progress Notes (Addendum)
Primary physician - dr Jonelle Sidle reed Cardiologist - dr. Candee Furbish at his office 6 Months ago - will request

## 2013-08-22 ENCOUNTER — Inpatient Hospital Stay (HOSPITAL_COMMUNITY): Payer: Medicare Other | Admitting: Anesthesiology

## 2013-08-22 ENCOUNTER — Encounter (HOSPITAL_COMMUNITY): Payer: Medicare Other | Admitting: Anesthesiology

## 2013-08-22 ENCOUNTER — Encounter (HOSPITAL_COMMUNITY): Payer: Self-pay | Admitting: Surgery

## 2013-08-22 ENCOUNTER — Encounter (HOSPITAL_COMMUNITY)
Admission: RE | Disposition: A | Discharge status: Skilled Nursing Facility | Payer: Self-pay | Source: Ambulatory Visit | Attending: Orthopedic Surgery

## 2013-08-22 ENCOUNTER — Inpatient Hospital Stay (HOSPITAL_COMMUNITY)
Admission: RE | Admit: 2013-08-22 | Discharge: 2013-08-25 | DRG: 502 | Disposition: A | Discharge status: Skilled Nursing Facility | Payer: Medicare Other | Source: Ambulatory Visit | Attending: Orthopedic Surgery | Admitting: Orthopedic Surgery

## 2013-08-22 DIAGNOSIS — Z9849 Cataract extraction status, unspecified eye: Secondary | ICD-10-CM

## 2013-08-22 DIAGNOSIS — I10 Essential (primary) hypertension: Secondary | ICD-10-CM | POA: Diagnosis present

## 2013-08-22 DIAGNOSIS — H543 Unqualified visual loss, both eyes: Secondary | ICD-10-CM | POA: Diagnosis present

## 2013-08-22 DIAGNOSIS — Z79899 Other long term (current) drug therapy: Secondary | ICD-10-CM

## 2013-08-22 DIAGNOSIS — M810 Age-related osteoporosis without current pathological fracture: Secondary | ICD-10-CM | POA: Diagnosis present

## 2013-08-22 DIAGNOSIS — Z7902 Long term (current) use of antithrombotics/antiplatelets: Secondary | ICD-10-CM

## 2013-08-22 DIAGNOSIS — H01029 Squamous blepharitis unspecified eye, unspecified eyelid: Secondary | ICD-10-CM | POA: Diagnosis present

## 2013-08-22 DIAGNOSIS — Z01810 Encounter for preprocedural cardiovascular examination: Secondary | ICD-10-CM

## 2013-08-22 DIAGNOSIS — N400 Enlarged prostate without lower urinary tract symptoms: Secondary | ICD-10-CM | POA: Diagnosis present

## 2013-08-22 DIAGNOSIS — E785 Hyperlipidemia, unspecified: Secondary | ICD-10-CM | POA: Diagnosis present

## 2013-08-22 DIAGNOSIS — S43429A Sprain of unspecified rotator cuff capsule, initial encounter: Principal | ICD-10-CM | POA: Diagnosis present

## 2013-08-22 DIAGNOSIS — S46819A Strain of other muscles, fascia and tendons at shoulder and upper arm level, unspecified arm, initial encounter: Secondary | ICD-10-CM | POA: Diagnosis present

## 2013-08-22 DIAGNOSIS — M75100 Unspecified rotator cuff tear or rupture of unspecified shoulder, not specified as traumatic: Secondary | ICD-10-CM

## 2013-08-22 DIAGNOSIS — W19XXXA Unspecified fall, initial encounter: Secondary | ICD-10-CM | POA: Diagnosis present

## 2013-08-22 DIAGNOSIS — F17200 Nicotine dependence, unspecified, uncomplicated: Secondary | ICD-10-CM | POA: Diagnosis present

## 2013-08-22 DIAGNOSIS — E119 Type 2 diabetes mellitus without complications: Secondary | ICD-10-CM | POA: Diagnosis present

## 2013-08-22 HISTORY — PX: SHOULDER ARTHROSCOPY WITH SUBACROMIAL DECOMPRESSION, ROTATOR CUFF REPAIR AND BICEP TENDON REPAIR: SHX5687

## 2013-08-22 LAB — GLUCOSE, CAPILLARY
Glucose-Capillary: 100 mg/dL — ABNORMAL HIGH (ref 70–99)
Glucose-Capillary: 138 mg/dL — ABNORMAL HIGH (ref 70–99)
Glucose-Capillary: 98 mg/dL (ref 70–99)

## 2013-08-22 SURGERY — SHOULDER ARTHROSCOPY WITH SUBACROMIAL DECOMPRESSION, ROTATOR CUFF REPAIR AND BICEP TENDON REPAIR
Anesthesia: Regional | Site: Shoulder | Laterality: Left

## 2013-08-22 MED ORDER — PIOGLITAZONE HCL 30 MG PO TABS
30.0000 mg | ORAL_TABLET | Freq: Every day | ORAL | Status: DC
  Administered 2013-08-23 – 2013-08-25 (×3): 30 mg via ORAL
  Filled 2013-08-22 (×4): qty 1

## 2013-08-22 MED ORDER — OXYCODONE HCL 5 MG PO TABS
5.0000 mg | ORAL_TABLET | ORAL | Status: DC | PRN
  Administered 2013-08-22 – 2013-08-23 (×3): 10 mg via ORAL
  Filled 2013-08-22 (×3): qty 2

## 2013-08-22 MED ORDER — HYDROMORPHONE HCL PF 1 MG/ML IJ SOLN
0.5000 mg | INTRAMUSCULAR | Status: DC | PRN
  Administered 2013-08-22 – 2013-08-23 (×4): 1 mg via INTRAVENOUS
  Filled 2013-08-22 (×4): qty 1

## 2013-08-22 MED ORDER — PROPOFOL 10 MG/ML IV BOLUS
INTRAVENOUS | Status: DC | PRN
  Administered 2013-08-22: 120 mg via INTRAVENOUS

## 2013-08-22 MED ORDER — LIDOCAINE HCL (CARDIAC) 20 MG/ML IV SOLN
INTRAVENOUS | Status: AC
  Filled 2013-08-22: qty 5

## 2013-08-22 MED ORDER — NEOSTIGMINE METHYLSULFATE 10 MG/10ML IV SOLN
INTRAVENOUS | Status: AC
  Filled 2013-08-22: qty 1

## 2013-08-22 MED ORDER — MIDAZOLAM HCL 2 MG/2ML IJ SOLN
INTRAMUSCULAR | Status: AC
  Filled 2013-08-22: qty 2

## 2013-08-22 MED ORDER — PROPOFOL 10 MG/ML IV BOLUS
INTRAVENOUS | Status: AC
  Filled 2013-08-22: qty 20

## 2013-08-22 MED ORDER — EPHEDRINE SULFATE 50 MG/ML IJ SOLN
INTRAMUSCULAR | Status: DC | PRN
  Administered 2013-08-22 (×2): 5 mg via INTRAVENOUS

## 2013-08-22 MED ORDER — FENTANYL CITRATE 0.05 MG/ML IJ SOLN
INTRAMUSCULAR | Status: AC
  Filled 2013-08-22: qty 5

## 2013-08-22 MED ORDER — METOCLOPRAMIDE HCL 5 MG/ML IJ SOLN
5.0000 mg | Freq: Three times a day (TID) | INTRAMUSCULAR | Status: DC | PRN
  Administered 2013-08-22: 10 mg via INTRAVENOUS
  Filled 2013-08-22: qty 2

## 2013-08-22 MED ORDER — BUPIVACAINE HCL (PF) 0.25 % IJ SOLN
INTRAMUSCULAR | Status: AC
  Filled 2013-08-22: qty 30

## 2013-08-22 MED ORDER — EPINEPHRINE HCL 1 MG/ML IJ SOLN
INTRAMUSCULAR | Status: DC | PRN
  Administered 2013-08-22: .1 mL

## 2013-08-22 MED ORDER — ROCURONIUM BROMIDE 100 MG/10ML IV SOLN
INTRAVENOUS | Status: DC | PRN
  Administered 2013-08-22: 50 mg via INTRAVENOUS

## 2013-08-22 MED ORDER — TAMSULOSIN HCL 0.4 MG PO CAPS
0.4000 mg | ORAL_CAPSULE | Freq: Every day | ORAL | Status: DC
  Administered 2013-08-23 – 2013-08-25 (×3): 0.4 mg via ORAL
  Filled 2013-08-22 (×4): qty 1

## 2013-08-22 MED ORDER — VENLAFAXINE HCL ER 75 MG PO CP24
75.0000 mg | ORAL_CAPSULE | Freq: Every day | ORAL | Status: DC
  Administered 2013-08-23 – 2013-08-25 (×3): 75 mg via ORAL
  Filled 2013-08-22 (×4): qty 1

## 2013-08-22 MED ORDER — POTASSIUM CHLORIDE IN NACL 20-0.9 MEQ/L-% IV SOLN
INTRAVENOUS | Status: AC
  Administered 2013-08-23: via INTRAVENOUS
  Filled 2013-08-22 (×2): qty 1000

## 2013-08-22 MED ORDER — VITAMIN D (CHOLECALCIFEROL) 25 MCG (1000 UT) PO TABS: 1000.0000 [IU] | ORAL_TABLET | Freq: Every day | ORAL | Status: DC

## 2013-08-22 MED ORDER — ONDANSETRON HCL 4 MG/2ML IJ SOLN
4.0000 mg | Freq: Four times a day (QID) | INTRAMUSCULAR | Status: DC | PRN
  Administered 2013-08-23: 4 mg via INTRAVENOUS
  Filled 2013-08-22 (×2): qty 2

## 2013-08-22 MED ORDER — ROPIVACAINE HCL 5 MG/ML IJ SOLN
INTRAMUSCULAR | Status: DC | PRN
  Administered 2013-08-22: 30 mL via PERINEURAL

## 2013-08-22 MED ORDER — MIDAZOLAM HCL 5 MG/5ML IJ SOLN
INTRAMUSCULAR | Status: DC | PRN
  Administered 2013-08-22 (×2): 1 mg via INTRAVENOUS

## 2013-08-22 MED ORDER — VITAMIN D3 25 MCG (1000 UNIT) PO TABS
1000.0000 [IU] | ORAL_TABLET | Freq: Every day | ORAL | Status: DC
  Administered 2013-08-24 – 2013-08-25 (×2): 1000 [IU] via ORAL
  Filled 2013-08-22 (×4): qty 1

## 2013-08-22 MED ORDER — GABAPENTIN 600 MG PO TABS
600.0000 mg | ORAL_TABLET | Freq: Three times a day (TID) | ORAL | Status: DC
  Administered 2013-08-23 – 2013-08-25 (×7): 600 mg via ORAL
  Filled 2013-08-22 (×11): qty 1

## 2013-08-22 MED ORDER — ONDANSETRON HCL 4 MG/2ML IJ SOLN
INTRAMUSCULAR | Status: DC | PRN
  Administered 2013-08-22: 4 mg via INTRAVENOUS

## 2013-08-22 MED ORDER — ONDANSETRON HCL 4 MG/2ML IJ SOLN
INTRAMUSCULAR | Status: AC
Start: 2013-08-22 — End: 2013-08-22
  Filled 2013-08-22: qty 2

## 2013-08-22 MED ORDER — DIAZEPAM 5 MG PO TABS: 5.0000 mg | ORAL_TABLET | Freq: Once | ORAL | Status: DC

## 2013-08-22 MED ORDER — FENTANYL CITRATE 0.05 MG/ML IJ SOLN
INTRAMUSCULAR | Status: DC | PRN
  Administered 2013-08-22 (×2): 50 ug via INTRAVENOUS

## 2013-08-22 MED ORDER — MENTHOL 3 MG MT LOZG: 1.0000 | LOZENGE | OROMUCOSAL | Status: DC | PRN

## 2013-08-22 MED ORDER — TESTOSTERONE CYPIONATE 100 MG/ML IM SOLN: 100.0000 mg | INTRAMUSCULAR | Status: DC

## 2013-08-22 MED ORDER — SODIUM CHLORIDE 0.9 % IJ SOLN
INTRAMUSCULAR | Status: AC
  Filled 2013-08-22: qty 10

## 2013-08-22 MED ORDER — FINASTERIDE 5 MG PO TABS
5.0000 mg | ORAL_TABLET | Freq: Every day | ORAL | Status: DC
  Administered 2013-08-23 – 2013-08-25 (×3): 5 mg via ORAL
  Filled 2013-08-22 (×4): qty 1

## 2013-08-22 MED ORDER — METOCLOPRAMIDE HCL 5 MG/ML IJ SOLN
INTRAMUSCULAR | Status: AC
  Filled 2013-08-22: qty 2

## 2013-08-22 MED ORDER — ROCURONIUM BROMIDE 50 MG/5ML IV SOLN
INTRAVENOUS | Status: AC
  Filled 2013-08-22: qty 1

## 2013-08-22 MED ORDER — GLYCOPYRROLATE 0.2 MG/ML IJ SOLN
INTRAMUSCULAR | Status: DC | PRN
  Administered 2013-08-22: 0.2 mg via INTRAVENOUS
  Administered 2013-08-22: 0.4 mg via INTRAVENOUS

## 2013-08-22 MED ORDER — PHENYLEPHRINE 40 MCG/ML (10ML) SYRINGE FOR IV PUSH (FOR BLOOD PRESSURE SUPPORT)
PREFILLED_SYRINGE | INTRAVENOUS | Status: AC
  Filled 2013-08-22: qty 10

## 2013-08-22 MED ORDER — SODIUM CHLORIDE 0.9 % IR SOLN
Status: DC | PRN
  Administered 2013-08-22: 1000 mL

## 2013-08-22 MED ORDER — ASPIRIN 325 MG PO TABS
325.0000 mg | ORAL_TABLET | Freq: Every day | ORAL | Status: DC
  Administered 2013-08-23 – 2013-08-25 (×3): 325 mg via ORAL
  Filled 2013-08-22 (×4): qty 1

## 2013-08-22 MED ORDER — EPHEDRINE SULFATE 50 MG/ML IJ SOLN
INTRAMUSCULAR | Status: AC
  Filled 2013-08-22: qty 1

## 2013-08-22 MED ORDER — ACETAMINOPHEN 650 MG RE SUPP
650.0000 mg | Freq: Four times a day (QID) | RECTAL | Status: DC | PRN
  Filled 2013-08-22: qty 1

## 2013-08-22 MED ORDER — HYDROCODONE-ACETAMINOPHEN 10-325 MG PO TABS
1.0000 | ORAL_TABLET | ORAL | Status: DC | PRN
  Administered 2013-08-22 – 2013-08-23 (×5): 2 via ORAL
  Administered 2013-08-24 (×2): 1 via ORAL
  Administered 2013-08-24: 2 via ORAL
  Administered 2013-08-25 (×3): 1 via ORAL
  Filled 2013-08-22: qty 1
  Filled 2013-08-22: qty 2
  Filled 2013-08-22: qty 1
  Filled 2013-08-22 (×3): qty 2
  Filled 2013-08-22 (×2): qty 1
  Filled 2013-08-22: qty 2
  Filled 2013-08-22 (×2): qty 1
  Filled 2013-08-22: qty 2
  Filled 2013-08-22: qty 1

## 2013-08-22 MED ORDER — LACTATED RINGERS IV SOLN
INTRAVENOUS | Status: DC | PRN
  Administered 2013-08-22 (×2): via INTRAVENOUS

## 2013-08-22 MED ORDER — POLYMYXIN B-TRIMETHOPRIM 10000-0.1 UNIT/ML-% OP SOLN
1.0000 [drp] | OPHTHALMIC | Status: DC
  Filled 2013-08-22: qty 10

## 2013-08-22 MED ORDER — NEOSTIGMINE METHYLSULFATE 10 MG/10ML IV SOLN
INTRAVENOUS | Status: DC | PRN
  Administered 2013-08-22: 3 mg via INTRAVENOUS

## 2013-08-22 MED ORDER — EPINEPHRINE HCL 1 MG/ML IJ SOLN
INTRAMUSCULAR | Status: AC
  Filled 2013-08-22: qty 1

## 2013-08-22 MED ORDER — LIDOCAINE HCL (CARDIAC) 20 MG/ML IV SOLN
INTRAVENOUS | Status: DC | PRN
  Administered 2013-08-22: 40 mg via INTRAVENOUS

## 2013-08-22 MED ORDER — SODIUM CHLORIDE 0.9 % IJ SOLN
INTRAMUSCULAR | Status: DC | PRN
  Administered 2013-08-22: 30 mL

## 2013-08-22 MED ORDER — PHENOL 1.4 % MT LIQD: 1.0000 | OROMUCOSAL | Status: DC | PRN

## 2013-08-22 MED ORDER — METOCLOPRAMIDE HCL 5 MG PO TABS
5.0000 mg | ORAL_TABLET | Freq: Three times a day (TID) | ORAL | Status: DC | PRN
  Filled 2013-08-22: qty 2

## 2013-08-22 MED ORDER — LINAGLIPTIN 5 MG PO TABS
5.0000 mg | ORAL_TABLET | Freq: Every day | ORAL | Status: DC
  Administered 2013-08-23 – 2013-08-25 (×3): 5 mg via ORAL
  Filled 2013-08-22 (×4): qty 1

## 2013-08-22 MED ORDER — SODIUM CHLORIDE 0.9 % IV SOLN
10.0000 mg | INTRAVENOUS | Status: DC | PRN
  Administered 2013-08-22: 10 ug/min via INTRAVENOUS

## 2013-08-22 MED ORDER — ACETAMINOPHEN 325 MG PO TABS
650.0000 mg | ORAL_TABLET | Freq: Four times a day (QID) | ORAL | Status: DC | PRN
  Administered 2013-08-25: 650 mg via ORAL
  Filled 2013-08-22 (×3): qty 2

## 2013-08-22 MED ORDER — GLYCOPYRROLATE 0.2 MG/ML IJ SOLN
INTRAMUSCULAR | Status: AC
  Filled 2013-08-22: qty 2

## 2013-08-22 MED ORDER — ALUM & MAG HYDROXIDE-SIMETH 200-200-20 MG/5ML PO SUSP
30.0000 mL | Freq: Three times a day (TID) | ORAL | Status: DC | PRN
Start: 2013-08-22 — End: 2013-08-25
  Administered 2013-08-22: 30 mL via ORAL
  Filled 2013-08-22: qty 30

## 2013-08-22 MED ORDER — ONDANSETRON HCL 4 MG PO TABS
4.0000 mg | ORAL_TABLET | Freq: Four times a day (QID) | ORAL | Status: DC | PRN
  Administered 2013-08-25: 4 mg via ORAL
  Filled 2013-08-22 (×2): qty 1

## 2013-08-22 MED ORDER — VITAMIN D (ERGOCALCIFEROL) 1.25 MG (50000 UNIT) PO CAPS
50000.0000 [IU] | ORAL_CAPSULE | ORAL | Status: DC
  Administered 2013-08-23: 50000 [IU] via ORAL
  Filled 2013-08-22: qty 1

## 2013-08-22 MED ORDER — CEFAZOLIN SODIUM 1-5 GM-% IV SOLN
1.0000 g | Freq: Four times a day (QID) | INTRAVENOUS | Status: AC
  Administered 2013-08-22 (×2): 1 g via INTRAVENOUS
  Filled 2013-08-22 (×3): qty 50

## 2013-08-22 SURGICAL SUPPLY — 88 items
ANCH SUT 2 FT CRKSCW 14.7 STRL (Anchor) ×2 IMPLANT
ANCH SUT 2 FT CRKSW 14.7X5.5 (Anchor) ×2 IMPLANT
ANCH SUT PUSHLCK 24X4.5 STRL (Orthopedic Implant) ×2 IMPLANT
ANCHOR CORKSCREW BIO 5.5 FT (Anchor) ×4 IMPLANT
ANCHOR CORKSCREW FIBER 5.5X15 (Anchor) ×2 IMPLANT
APL SKNCLS STERI-STRIP NONHPOA (GAUZE/BANDAGES/DRESSINGS) ×1
BENZOIN TINCTURE PRP APPL 2/3 (GAUZE/BANDAGES/DRESSINGS) ×1 IMPLANT
BIT DRILL TAK (DRILL) IMPLANT
BLADE 10 SAFETY STRL DISP (BLADE) ×3 IMPLANT
BLADE CUDA 5.5 (BLADE) IMPLANT
BLADE CUTTER GATOR 3.5 (BLADE) ×2 IMPLANT
BLADE GREAT WHITE 4.2 (BLADE) ×1 IMPLANT
BLADE SURG 11 STRL SS (BLADE) ×2 IMPLANT
BLADE SURG 15 STRL LF DISP TIS (BLADE) IMPLANT
BLADE SURG 15 STRL SS (BLADE) ×4
BUR GATOR 2.9 (BURR) IMPLANT
BUR OVAL 6.0 (BURR) ×2 IMPLANT
CANNULA SHOULDER 7CM (CANNULA) IMPLANT
CARTRIDGE CURVETEK MED (MISCELLANEOUS) IMPLANT
CARTRIDGE CURVETEK XLRG (MISCELLANEOUS) IMPLANT
COVER SURGICAL LIGHT HANDLE (MISCELLANEOUS) ×2 IMPLANT
DRAPE INCISE IOBAN 66X45 STRL (DRAPES) ×4 IMPLANT
DRAPE STERI 35X30 U-POUCH (DRAPES) ×2 IMPLANT
DRAPE U-SHAPE 47X51 STRL (DRAPES) ×6 IMPLANT
DRILL TAK (DRILL)
DRSG MEPILEX BORDER 4X4 (GAUZE/BANDAGES/DRESSINGS) ×2 IMPLANT
DRSG MEPILEX BORDER 4X8 (GAUZE/BANDAGES/DRESSINGS) ×1 IMPLANT
DRSG PAD ABDOMINAL 8X10 ST (GAUZE/BANDAGES/DRESSINGS) ×6 IMPLANT
DURAPREP 26ML APPLICATOR (WOUND CARE) ×4 IMPLANT
ELECT MENISCUS 165MM 90D (ELECTRODE) IMPLANT
ELECT REM PT RETURN 9FT ADLT (ELECTROSURGICAL) ×2
ELECTRODE REM PT RTRN 9FT ADLT (ELECTROSURGICAL) ×1 IMPLANT
FILTER STRAW FLUID ASPIR (MISCELLANEOUS) ×2 IMPLANT
GAUZE XEROFORM 1X8 LF (GAUZE/BANDAGES/DRESSINGS) ×2 IMPLANT
GLOVE BIO SURGEON ST LM GN SZ9 (GLOVE) ×1 IMPLANT
GLOVE BIOGEL PI IND STRL 6.5 (GLOVE) ×2 IMPLANT
GLOVE BIOGEL PI IND STRL 7.5 (GLOVE) IMPLANT
GLOVE BIOGEL PI IND STRL 8 (GLOVE) ×1 IMPLANT
GLOVE BIOGEL PI INDICATOR 6.5 (GLOVE) ×2
GLOVE BIOGEL PI INDICATOR 7.5 (GLOVE) ×1
GLOVE BIOGEL PI INDICATOR 8 (GLOVE) ×1
GLOVE ECLIPSE 7.0 STRL STRAW (GLOVE) ×4 IMPLANT
GLOVE SURG ORTHO 8.0 STRL STRW (GLOVE) ×2 IMPLANT
GLOVE SURG SS PI 7.0 STRL IVOR (GLOVE) ×1 IMPLANT
GOWN STRL REUS W/ TWL LRG LVL3 (GOWN DISPOSABLE) ×3 IMPLANT
GOWN STRL REUS W/TWL LRG LVL3 (GOWN DISPOSABLE) ×6
KIT BASIN OR (CUSTOM PROCEDURE TRAY) ×2 IMPLANT
KIT ROOM TURNOVER OR (KITS) ×2 IMPLANT
MANIFOLD NEPTUNE II (INSTRUMENTS) ×2 IMPLANT
NDL HYPO 25X1 1.5 SAFETY (NEEDLE) ×1 IMPLANT
NDL SPNL 18GX3.5 QUINCKE PK (NEEDLE) ×1 IMPLANT
NDL SUT 6 .5 CRC .975X.05 MAYO (NEEDLE) ×1 IMPLANT
NEEDLE HYPO 25X1 1.5 SAFETY (NEEDLE) ×2 IMPLANT
NEEDLE MAYO TAPER (NEEDLE) ×2
NEEDLE SPNL 18GX3.5 QUINCKE PK (NEEDLE) ×2 IMPLANT
NS IRRIG 1000ML POUR BTL (IV SOLUTION) ×2 IMPLANT
PACK SHOULDER (CUSTOM PROCEDURE TRAY) ×2 IMPLANT
PAD ABD 8X10 STRL (GAUZE/BANDAGES/DRESSINGS) ×1 IMPLANT
PAD ARMBOARD 7.5X6 YLW CONV (MISCELLANEOUS) ×5 IMPLANT
PUSHLOCK PEEK 4.5X24 (Orthopedic Implant) ×2 IMPLANT
SCREW TENODESIS BIOCOMP 7MM (Screw) ×1 IMPLANT
SET ARTHROSCOPY TUBING (MISCELLANEOUS) ×2
SET ARTHROSCOPY TUBING LN (MISCELLANEOUS) ×1 IMPLANT
SLING ARM IMMOBILIZER LRG (SOFTGOODS) ×2 IMPLANT
SLING ARM IMMOBILIZER MED (SOFTGOODS) ×2 IMPLANT
SPEAR FASTAKII (SLEEVE) IMPLANT
SPONGE LAP 4X18 X RAY DECT (DISPOSABLE) ×4 IMPLANT
STRIP CLOSURE SKIN 1/2X4 (GAUZE/BANDAGES/DRESSINGS) ×1 IMPLANT
SUCTION FRAZIER TIP 10 FR DISP (SUCTIONS) ×2 IMPLANT
SUT ETHILON 3 0 PS 1 (SUTURE) ×3 IMPLANT
SUT FIBERWIRE 2-0 18 17.9 3/8 (SUTURE)
SUT PROLENE 3 0 PS 2 (SUTURE) ×2 IMPLANT
SUT VIC AB 0 CT1 27 (SUTURE) ×6
SUT VIC AB 0 CT1 27XBRD ANBCTR (SUTURE) ×2 IMPLANT
SUT VIC AB 1 CT1 27 (SUTURE) ×2
SUT VIC AB 1 CT1 27XBRD ANBCTR (SUTURE) ×1 IMPLANT
SUT VIC AB 2-0 CT1 27 (SUTURE) ×2
SUT VIC AB 2-0 CT1 TAPERPNT 27 (SUTURE) ×1 IMPLANT
SUT VICRYL 0 UR6 27IN ABS (SUTURE) ×6 IMPLANT
SUTURE FIBERWR 2-0 18 17.9 3/8 (SUTURE) IMPLANT
SYR 20CC LL (SYRINGE) ×4 IMPLANT
SYR 3ML LL SCALE MARK (SYRINGE) ×2 IMPLANT
SYR TB 1ML LUER SLIP (SYRINGE) ×2 IMPLANT
TOWEL OR 17X24 6PK STRL BLUE (TOWEL DISPOSABLE) ×2 IMPLANT
TOWEL OR 17X26 10 PK STRL BLUE (TOWEL DISPOSABLE) ×2 IMPLANT
WAND 90 DEG TURBOVAC W/CORD (SURGICAL WAND) ×1 IMPLANT
WAND HAND CNTRL MULTIVAC 90 (MISCELLANEOUS) IMPLANT
WATER STERILE IRR 1000ML POUR (IV SOLUTION) ×2 IMPLANT

## 2013-08-22 NOTE — Brief Op Note (Signed)
08/22/2013  10:22 AM  PATIENT:  Scott Willis  73 y.o. male  PRE-OPERATIVE DIAGNOSIS:  LEFT SHOULDER ROTATOR CUFF TEAR, BURSITIS, BICEPS SUBLUXATION  POST-OPERATIVE DIAGNOSIS:  LEFT SHOULDER ROTATOR CUFF TEAR, BURSITIS, BICEPS SUBLUXATION  PROCEDURE:  Procedure(s):  LEFT SHOULDER DIAGNOSTIC OPERATIVE ARTHROSCOPY ,EXTENSIVE DEBRIDEMENT, BICEPS RELEASE AND TENODESIS, ROTATOR CUFF REPAIR, SUBACROMIAL DECOMPRESSION  SURGEON:  Surgeon(s): Meredith Pel, MD  ASSISTANT: carla bethune rnfa  ANESTHESIA:   general  EBL: 50 ml    Total I/O In: 1000 [I.V.:1000] Out: 100 [Blood:100]  BLOOD ADMINISTERED: none  DRAINS: none   LOCAL MEDICATIONS USED:  none  SPECIMEN:  No Specimen  COUNTS:  YES  TOURNIQUET:  * No tourniquets in log *  DICTATION: .Other Dictation: Dictation Number [id number]  PLAN OF CARE: Admit to inpatient   PATIENT DISPOSITION:  PACU - hemodynamically stable

## 2013-08-22 NOTE — Transfer of Care (Signed)
Immediate Anesthesia Transfer of Care Note  Patient: Scott Willis  Procedure(s) Performed: Procedure(s) with comments:  LEFT SHOULDER DIAGNOSTIC OPERATIVE ARTHROSCOPY ,EXTENSIVE DEBRIDEMENT, BICEPS RELEASE AND TENODESIS, ROTATOR CUFF REPAIR, SUBACROMIAL DECOMPRESSION (Left) - LEFT SHOULDER DOA,EXTENSIVE DEBRIDEMENT, BICEPS RELEASE AND TENODESIS, ROTATOR CUFF REPAIR, SUBACROMIAL DECOMPRESSION,  Patient Location: PACU  Anesthesia Type:General  Level of Consciousness: awake, alert  and oriented  Airway & Oxygen Therapy: Patient Spontanous Breathing and Patient connected to nasal cannula oxygen  Post-op Assessment: Report given to PACU RN, Post -op Vital signs reviewed and stable and Patient moving all extremities X 4  Post vital signs: Reviewed and stable  Complications: No apparent anesthesia complications

## 2013-08-22 NOTE — Anesthesia Postprocedure Evaluation (Signed)
  Anesthesia Post-op Note  Patient: Scott Willis  Procedure(s) Performed: Procedure(s) with comments:  LEFT SHOULDER DIAGNOSTIC OPERATIVE ARTHROSCOPY ,EXTENSIVE DEBRIDEMENT, BICEPS RELEASE AND TENODESIS, ROTATOR CUFF REPAIR, SUBACROMIAL DECOMPRESSION (Left) - LEFT SHOULDER DOA,EXTENSIVE DEBRIDEMENT, BICEPS RELEASE AND TENODESIS, ROTATOR CUFF REPAIR, SUBACROMIAL DECOMPRESSION,  Patient Location: PACU  Anesthesia Type:General and block  Level of Consciousness: awake and alert   Airway and Oxygen Therapy: Patient Spontanous Breathing  Post-op Pain: none  Post-op Assessment: Post-op Vital signs reviewed, Patient's Cardiovascular Status Stable and Respiratory Function Stable  Post-op Vital Signs: Reviewed  Filed Vitals:   08/22/13 1200  BP: 138/70  Pulse: 67  Temp:   Resp:     Complications: No apparent anesthesia complications

## 2013-08-22 NOTE — Anesthesia Procedure Notes (Addendum)
Anesthesia Regional Block:  Interscalene brachial plexus block  Pre-Anesthetic Checklist: ,, timeout performed, Correct Patient, Correct Site, Correct Laterality, Correct Procedure, Correct Position, site marked, Risks and benefits discussed, pre-op evaluation,  At surgeon's request and post-op pain management  Laterality: Left  Prep: Maximum Sterile Barrier Precautions used and chloraprep       Needles:  Injection technique: Single-shot  Needle Type: Echogenic Stimulator Needle     Needle Length: 5cm 5 cm Needle Gauge: 22 and 22 G    Additional Needles:  Procedures: ultrasound guided (picture in chart) and nerve stimulator Interscalene brachial plexus block  Nerve Stimulator or Paresthesia:  Response: Biceps response,   Additional Responses:   Narrative:  Start time: 08/22/2013 7:12 AM End time: 08/22/2013 7:21 AM Injection made incrementally with aspirations every 5 mL. Anesthesiologist: Ola Spurr, MD  Additional Notes: 2% Lidocaine skin wheel.    Procedure Name: Intubation Date/Time: 08/22/2013 7:35 AM Performed by: Kyung Rudd Pre-anesthesia Checklist: Patient identified, Emergency Drugs available, Suction available, Patient being monitored and Timeout performed Patient Re-evaluated:Patient Re-evaluated prior to inductionOxygen Delivery Method: Circle system utilized Preoxygenation: Pre-oxygenation with 100% oxygen Intubation Type: IV induction Ventilation: Mask ventilation without difficulty and Oral airway inserted - appropriate to patient size Laryngoscope Size: Mac and 3 Grade View: Grade I Tube type: Oral Tube size: 7.5 mm Number of attempts: 1 Airway Equipment and Method: Stylet Placement Confirmation: ETT inserted through vocal cords under direct vision,  positive ETCO2 and breath sounds checked- equal and bilateral Secured at: 22 cm Tube secured with: Tape Dental Injury: Teeth and Oropharynx as per pre-operative assessment

## 2013-08-22 NOTE — Progress Notes (Signed)
Patient with trouble swallowing post surgery, vomiting food in PACU.  Patient still with trouble once arrival to floor, complaints of feeling as if food had been stuck from earlier, and discomfort.  No complaints of chest pain, or SOB  Spoke with Dr. Lorin Mercy, feels as if issue with swallowing may be attributed to nerve block patient received.  Instructed to place 2L 02 on patient and watch for any changes. Nursing will continue to monitor.

## 2013-08-22 NOTE — H&P (Signed)
Scott Willis is an 73 y.o. male.   Chief Complaint: Left shoulder pain HPI: Scott Willis is a 73 year old patient who sustained a fall several months ago. He's had persistent left shoulder pain since that time he has had right carpal tunnel surgery which left him with some hand weakness on the right-hand side and thus his left side this is "good" side. He shoulders been very painful and weak with mechanical symptoms of grinding and popping since the fall. He's had an MRI scan which shows supraspinatus tear along with medial subluxation of the biceps and subscapularis tendinopathy. He is failed course of nonoperative management. He reports pain with ADLs as well as pain at night. He works as a Personal assistant and this has interfered with his ability to prevent and work. No family history of DVT or pulmonary embolism  Past Medical History  Diagnosis Date  . Macular degeneration   . Hyperlipidemia LDL goal < 100   . Benign essential hypertension   . Osteoporosis due to androgen therapy   . Low serum testosterone level   . BPH (benign prostatic hyperplasia)   . Blepharitis, squamous     Right Eye   . DM (diabetes mellitus) type II uncontrolled with eye manifestation     fasting 110s  . Kidney stones   . Arthritis   . Blindness     Past Surgical History  Procedure Laterality Date  . Back surgery  1998    Dr.Nitka  . Refractive surgery  09/2005    Macular Degeneration Dr.Mathews   . Back surgery  03/2006    Removed Rods Dr.Nitka   . Carpal tunnel release  08/2006  . Cataract extraction  02/21/2007    Dr.Epps   . Kidney stone surgery  08/11/2007    Dr.Tannenbaum   . Carpal tunnel release  08/2006    Dr.Nitka   . Lithotripsy  08/11/2007    Dr.Nesi  . Eye surgery  2013    Right eye every 8 weeks     Family History  Problem Relation Age of Onset  . Colon cancer Father   . Alzheimer's disease Mother   . Diabetes Sister    Social History:  reports that he has been smoking Cigarettes.   He has a 88.5 pack-year smoking history. He does not have any smokeless tobacco history on file. He reports that he does not drink alcohol or use illicit drugs.  Allergies:  Allergies  Allergen Reactions  . Bee Pollen Anaphylaxis  . Tape     Any medical tape, Rash   . Flurox [Fluorescein-Benoxinate]     Rash   . Metformin And Related     Severe Diarrhea   . Morphine And Related     Kidney failure   . Statins     Loss of appetite    Medications Prior to Admission  Medication Sig Dispense Refill  . finasteride (PROSCAR) 5 MG tablet Take 5 mg by mouth daily.      Marland Kitchen gabapentin (NEURONTIN) 600 MG tablet Take 600 mg by mouth 3 (three) times daily.      Marland Kitchen moxifloxacin (VIGAMOX) 0.5 % ophthalmic solution 1 drop 3 (three) times daily.      . pioglitazone (ACTOS) 30 MG tablet Take 30 mg by mouth daily. Dr.[name]      . Polyethyl Glycol-Propyl Glycol (SYSTANE OP) Apply to eye as needed.      . sitaGLIPtin (JANUVIA) 100 MG tablet Take 100 mg by mouth daily.      Marland Kitchen  tamsulosin (FLOMAX) 0.4 MG CAPS capsule Take 0.4 mg by mouth daily.      Marland Kitchen testosterone cypionate (DEPO-TESTOSTERONE) 100 MG/ML injection Inject 1 mL (100 mg total) into the muscle every 28 (twenty-eight) days. For IM use only and Generic is fine  10 mL  3  . trimethoprim-polymyxin b (POLYTRIM) ophthalmic solution Place 1 drop into the right eye every 4 (four) hours.      Marland Kitchen venlafaxine XR (EFFEXOR-XR) 75 MG 24 hr capsule Take 75 mg by mouth daily with breakfast.      . diazepam (VALIUM) 5 MG tablet Take 5 mg by mouth once. 1 by mouth on the day of eye surgery ONCE every 10 weeks  Dr.[name]      . HYDROcodone-acetaminophen (NORCO/VICODIN) 5-325 MG per tablet Take 1 tablet by mouth once. 1 by mouth every 10 weeks prior to eye surgery (30 min prior to surgery)      . Multiple Vitamins-Minerals (PRESERVISION AREDS PO) Take 1 tablet by mouth 4 (four) times daily.       . Vitamin D, Cholecalciferol, 1000 UNITS TABS Take 1,000 Units by  mouth daily.      . Vitamin D, Ergocalciferol, (DRISDOL) 50000 UNITS CAPS capsule Take 1 capsule (50,000 Units total) by mouth every 30 (thirty) days. For osteoporosis  4 capsule  3    Results for orders placed during the hospital encounter of 08/22/13 (from the past 48 hour(s))  GLUCOSE, CAPILLARY     Status: Abnormal   Collection Time    08/22/13  6:33 AM      Result Value Ref Range   Glucose-Capillary 100 (*) 70 - 99 mg/dL   No results found.  Review of Systems  Constitutional: Negative.   HENT: Negative.   Eyes: Negative.   Respiratory: Negative.   Cardiovascular: Negative.   Gastrointestinal: Negative.   Genitourinary: Negative.   Musculoskeletal: Negative.   Skin: Negative.   Neurological: Negative.   Endo/Heme/Allergies: Negative.   Psychiatric/Behavioral: Negative.     Blood pressure 151/75, pulse 58, temperature 98.4 F (36.9 C), temperature source Oral, resp. rate 18, SpO2 100.00%. Physical Exam  Constitutional: He appears well-developed.  HENT:  Head: Normocephalic.  Eyes: Pupils are equal, round, and reactive to light.  Neck: Normal range of motion.  Cardiovascular: Normal rate.   Respiratory: Effort normal.  Neurological: He is alert.  Skin: Skin is warm.  Psychiatric: He has a normal mood and affect.   left shoulder exam demonstrates supraspinatus weakness coarse grinding and popping with active and passive range of motion some or stricture extra rotation 15 abduction about 10-15 compared to the right-hand side he also has about 1520 less passive forward flexion on the left compared to the right no a.c. joint tenderness to direct palpation impingement signs positive radial pulse intact on the left-hand side  Assessment/Plan Impression is left shoulder rotator cuff tear biceps subluxation plan shoulder arthroscopy with an attempt at rotator cuff repair. The tear is retracted but there's not much atrophy in the muscle belly. This likely an acute on chronic  care and should be at least partially repairable I think this subluxation of the biceps tendon is also symptomatic for him. We'll plan on biceps tenodesis as well. Risk and benefits of the surgery including not limited to infection nerve vessel damage shoulder stiffness the possibility that the rotator cuff tear may not be completely fixable are all discussed. Patient understands and wished to proceed with intervention all questions answered  Meredith Pel 08/22/2013,  7:20 AM

## 2013-08-22 NOTE — Anesthesia Preprocedure Evaluation (Addendum)
Anesthesia Evaluation  Patient identified by MRN, date of birth, ID band Patient awake    Reviewed: Allergy & Precautions, H&P , NPO status , Patient's Chart, lab work & pertinent test results  Airway Mallampati: I TM Distance: >3 FB Neck ROM: Full    Dental no notable dental hx. (+) Edentulous Upper, Dental Advisory Given   Pulmonary Current Smoker,  breath sounds clear to auscultation  Pulmonary exam normal       Cardiovascular hypertension, negative cardio ROS  Rhythm:Regular Rate:Normal     Neuro/Psych negative neurological ROS  negative psych ROS   GI/Hepatic negative GI ROS, Neg liver ROS,   Endo/Other  diabetes, Type 2, Oral Hypoglycemic Agents  Renal/GU negative Renal ROS  negative genitourinary   Musculoskeletal   Abdominal   Peds  Hematology negative hematology ROS (+)   Anesthesia Other Findings   Reproductive/Obstetrics negative OB ROS                         Anesthesia Physical Anesthesia Plan  ASA: II  Anesthesia Plan: General and Regional   Post-op Pain Management:    Induction: Intravenous  Airway Management Planned: Oral ETT  Additional Equipment:   Intra-op Plan:   Post-operative Plan: Extubation in OR  Informed Consent: I have reviewed the patients History and Physical, chart, labs and discussed the procedure including the risks, benefits and alternatives for the proposed anesthesia with the patient or authorized representative who has indicated his/her understanding and acceptance.   Dental advisory given  Plan Discussed with: CRNA  Anesthesia Plan Comments:         Anesthesia Quick Evaluation

## 2013-08-23 ENCOUNTER — Ambulatory Visit: Payer: No Typology Code available for payment source | Admitting: Podiatry

## 2013-08-23 LAB — GLUCOSE, CAPILLARY
Glucose-Capillary: 121 mg/dL — ABNORMAL HIGH (ref 70–99)
Glucose-Capillary: 132 mg/dL — ABNORMAL HIGH (ref 70–99)
Glucose-Capillary: 111 mg/dL — ABNORMAL HIGH (ref 70–99)
Glucose-Capillary: 129 mg/dL — ABNORMAL HIGH (ref 70–99)

## 2013-08-23 MED ORDER — OXYCODONE HCL ER 10 MG PO T12A
10.0000 mg | EXTENDED_RELEASE_TABLET | Freq: Two times a day (BID) | ORAL | Status: DC
  Administered 2013-08-23 – 2013-08-25 (×5): 10 mg via ORAL
  Filled 2013-08-23 (×5): qty 1

## 2013-08-23 NOTE — Progress Notes (Signed)
Subjective: Pt reports nausea and shoulder pain left   Objective: Vital signs in last 24 hours: Temp:  [97.9 F (36.6 C)-98.7 F (37.1 C)] 98.7 F (37.1 C) (05/13 0521) Pulse Rate:  [60-70] 65 (05/13 0521) Resp:  [12-21] 18 (05/13 0521) BP: (120-166)/(61-85) 156/80 mmHg (05/13 0521) SpO2:  [96 %-100 %] 99 % (05/13 0521)  Intake/Output from previous day: 05/12 0701 - 05/13 0700 In: 2140 [P.O.:240; I.V.:1900] Out: 1500 [Urine:1400; Blood:100] Intake/Output this shift:    Exam:  Intact pulses distally Compartment soft  Labs:  Recent Labs  08/21/13 1005  HGB 13.4    Recent Labs  08/21/13 1005  WBC 6.5  RBC 4.39  HCT 41.5  PLT 188    Recent Labs  08/21/13 1005  NA 140  K 4.1  CL 100  CO2 29  BUN 16  CREATININE 1.55*  GLUCOSE 111*  CALCIUM 9.2   No results found for this basename: LABPT, INR,  in the last 72 hours  Assessment/Plan: Plan OT for shoulder rom today - will need snf - likely ready tomorrow   Meredith Pel 08/23/2013, 8:25 AM

## 2013-08-23 NOTE — Op Note (Signed)
NAME:  Scott Willis, Scott Willis NO.:  [id number]  MEDICAL RECORD NO.:  [id number]  LOCATION:  [id number]                        FACILITY:  [facility]  PHYSICIAN:  Anderson Malta, M.D.    DATE OF BIRTH:  January 27, 1941  DATE OF PROCEDURE:  08/22/2013 DATE OF DISCHARGE:                              OPERATIVE REPORT   PREOPERATIVE DIAGNOSIS:  Right shoulder rotator cuff tear and biceps tendon subluxation.  POSTOPERATIVE DIAGNOSIS:  Right shoulder rotator cuff tear and biceps tendon subluxation.  PROCEDURE:  Right shoulder diagnostic arthroscopy, extensive debridement, biceps tendon release with subsequent open biceps tenodesis, subacromial decompression, and mini open rotator cuff repair of both the subscapularis and the supraspinatus tendon.  SURGEON:  Anderson Malta, M.D.  ASSISTANT:  Kathee Delton, RNFA.  INDICATIONS:  Scott Willis is a patient with right shoulder pain following a fall.  He presents now for operative management after explanation risks and benefits.  PROCEDURE IN DETAIL:  The patient brought to the operating room, where general endotracheal anesthesia induced and preop antibiotics administered.  Time-out was called.  Examination under anesthesia, the left arm demonstrated good external rotation at 15 degrees, abduction in good forward flexion to about 165.  The patient has placed in the beach- chair position with the head in neutral position.  Left arm shoulder and hand pre-scrubbed with alcohol and Betadine, which was allowed to air dry.  Prepped with DuraPrep solution, draped in a sterile manner.  Charlie Pitter was used to cover the axilla.  Solution of saline and epinephrine was injected into the subacromial space.  Diagnostic arthroscopy was performed.  The poster portal 2 cm medial and inferior to the posterolateral margin of the acromion.  The patient did have tendinitis of the biceps tendon, did have some tearing of the upper and the subscapularis tendon as well  as a large rotator cuff tear of the supraspinatus as well as the upper part of the subscapularis.  Extensive debridement performed after release of the biceps tendon.  Anterior portal created in direct visualization.  Diagnostic arthroscopy was performed.  The biceps tendon released at this time.  Extensive debridement of the labrum performed.  There was no synovitis in the inferior axillary recess, however, there was some penetration of the capsule.  The shoulder was stable under examination.  At this time, the scope was placed in the subacromial space.  Lateral portal created. Subacromial decompression performed.  Bursectomy performed.  At this time, instruments were removed from the portals, which were closed using 3-0 nylon.  Charlie Pitter was used to cover the operative field.  An incision was made off the anterolateral margin of the acromion.  Deltoid split to measure for a distance of 4 cm from the anterolateral margin of the acromion marked with #1 Vicryl suture.  Rotator cuff tear was identified and bursectomy completed.  Rotator cuff was mobilized, footprint was prepared with a curette.  Watertight repair was achieved.  The biceps tenodesis was 1st performed with a 7 x 23 mm interference screw from [employer].  This was done under appropriate tension.  At this time, the 355 corkscrews and 2 PushLocks were used to achieve a watertight repair of the supraspinatus and upper portion of  the subscapularis.  Six suture strands each placed in 2 push locks, which were placed below the tuberosity by about 2 cm.  Watertight repair achieved.  Thorough decompression of the subacromial space was present.  At this time, thorough irrigation of the entire incision performed, instruments were removed.  The incision was closed using 0 Vicryl suture followed by interrupted inverted 0 Vicryl suture and 3-0 Prolene.  Dressing applied. The patient was transferred to recovery in stable condition.  Kathee Delton  assistance was required during the case for retraction, opening, closing.  Her assistance was medical necessity.     Anderson Malta, M.D.     GSD/MEDQ  D:  08/22/2013  T:  08/23/2013  Job:  [id number]

## 2013-08-23 NOTE — Progress Notes (Signed)
Occupational Therapy Evaluation Patient Details Name: Scott Willis MRN: [id number] DOB: Oct 10, 1940 Today's Date: 08/23/2013    History of Present Illness Pt is 73 y.o Male s/p Right shoulder  arthroscopy, debridement, biceps tendon release, subacromial decompression, and mini open rotator cuff repair.   Clinical Impression   PTA pt reports that he lived in the back of his "shop," sleeping on a table, and was independent with ADLs and functional mobility. Pt has no caregiver support and plans to d/c to SNF. Pt with decreased memory of precautions and presents with lethargy, likely from pain medications. Pt would benefit from rehab at SNF in addition to acute OT to address immediate OT needs to increase independence.     Follow Up Recommendations  SNF;Supervision/Assistance - 24 hour    Equipment Recommendations  Other (comment) (Defer to SNF)       Precautions / Restrictions Precautions Precautions: Shoulder Type of Shoulder Precautions: No shoulder ROM ordered; per MD order ADL and Sling education. Shoulder Interventions: Shoulder sling/immobilizer;At all times;Off for dressing/bathing/exercises Precaution Booklet Issued: Yes (comment) Precaution Comments: Educated pt on precautions and incorporating into ADLs.  Required Braces or Orthoses: Sling Restrictions Weight Bearing Restrictions: Yes LUE Weight Bearing: Non weight bearing      Mobility Bed Mobility Overal bed mobility: Needs Assistance Bed Mobility: Supine to Sit     Supine to sit: Min assist;HOB elevated     General bed mobility comments: Pt held OT with RUE and pulled to EOB to sit.  Transfers Overall transfer level: Needs assistance Equipment used: 1 person hand held assist Transfers: Sit to/from Stand Sit to Stand: Min assist         General transfer comment: Pt required min A for sit<>stand, possibly due to lethargy from medications. Pt reports no balance issues prior to surgery.    Balance  Overall balance assessment: Needs assistance Sitting-balance support: Single extremity supported;Feet supported Sitting balance-Leahy Scale: Poor Sitting balance - Comments: Suspect balance in sitting was impaired due to lethargy from medications.   Standing balance support: Single extremity supported;During functional activity Standing balance-Leahy Scale: Poor Standing balance comment: Pt required one person hand held assist during standing.                            ADL Overall ADL's : Needs assistance/impaired Eating/Feeding: Set up;Sitting   Grooming: Set up;Sitting   Upper Body Bathing: Moderate assistance;Sitting   Lower Body Bathing: Sit to/from stand;Minimal assistance   Upper Body Dressing : Maximal assistance;Sitting (including sling)   Lower Body Dressing: Moderate assistance;Sit to/from stand   Toilet Transfer: Minimal assistance;Ambulation;BSC (sit<>stand)   Toileting- Clothing Manipulation and Hygiene: Minimal assistance;Sit to/from stand   Tub/ Shower Transfer: Walk-in shower;Minimal assistance;Ambulation (One person Hand Held)   Functional mobility during ADLs: Minimal assistance (One person Hand Held) General ADL Comments: Pt will require assistance for ADLs and demonstrated decreased memory, likely due to lethargy from pain medications. Educated pt on compensatory techniques for UB ADLs and safety with functional mobility. Pt reports he plans to stay at SNF for rehab.     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                   Perception Perception Perception Tested?: No   Praxis Praxis Praxis tested?: Within functional limits    Pertinent Vitals/Pain  Pt reports pain as 10/10 and grimaces with slightest movement of LUE.      Hand Dominance Right   Extremity/Trunk Assessment Upper Extremity Assessment Upper Extremity Assessment: LUE  deficits/detail;RUE deficits/detail RUE Deficits / Details: Pt reports decreased sensation in R 4th and 5th digits due to Carpal Tunnel Syndrome.  RUE Sensation: decreased light touch LUE Deficits / Details: No shoulder ROM; elbow, wrist, hand only LUE: Unable to fully assess due to pain;Unable to fully assess due to immobilization LUE Coordination: decreased fine motor;decreased gross motor   Lower Extremity Assessment Lower Extremity Assessment: Overall WFL for tasks assessed (pt with slow shuffling gait, likely due to medication. )   Cervical / Trunk Assessment Cervical / Trunk Assessment: Normal   Communication Communication Communication: No difficulties   Cognition Arousal/Alertness: Lethargic;Suspect due to medications Behavior During Therapy: WFL for tasks assessed/performed Overall Cognitive Status: Within Functional Limits for tasks assessed       Memory: Decreased recall of precautions;Decreased short-term memory                Exercises Exercises: Shoulder     Shoulder Instructions Shoulder Instructions Donning/doffing shirt without moving shoulder: Maximal assistance Method for sponge bathing under operated UE: Moderate assistance Donning/doffing sling/immobilizer: Maximal assistance Correct positioning of sling/immobilizer: Maximal assistance ROM for elbow, wrist and digits of operated UE: Minimal assistance Sling wearing schedule (on at all times/off for ADL's): Minimal assistance Proper positioning of operated UE when showering: Minimal assistance Positioning of UE while sleeping: Minimal assistance    Home Living Family/patient expects to be discharged to:: Skilled nursing facility Living Arrangements: Alone                               Additional Comments: Pt reports that he lost his housing ~12 years ago and sleeps on a table in the back of his "shop." Pt has no caregiver support.       Prior Functioning/Environment Level of  Independence: Independent             OT Diagnosis: Generalized weakness;Acute pain   OT Problem List: Decreased strength;Decreased range of motion;Decreased activity tolerance;Impaired balance (sitting and/or standing);Decreased safety awareness;Decreased knowledge of use of DME or AE;Decreased knowledge of precautions;Impaired UE functional use;Pain   OT Treatment/Interventions: Self-care/ADL training;Therapeutic exercise;Energy conservation;DME and/or AE instruction;Therapeutic activities;Patient/family education;Balance training    OT Goals(Current goals can be found in the care plan section) Acute Rehab OT Goals Patient Stated Goal: to go to rehab and be independent OT Goal Formulation: With patient Time For Goal Achievement: 08/30/13 Potential to Achieve Goals: Good ADL Goals Pt Will Perform Upper Body Bathing: with min assist;sitting;with adaptive equipment Pt Will Perform Upper Body Dressing: with min assist;sitting;with adaptive equipment Pt Will Transfer to Toilet: with supervision;ambulating;bedside commode Pt/caregiver will Perform Home Exercise Program: Increased ROM;Left upper extremity;With Supervision Additional ADL Goal #1: Pt will perform bed mobility with supervision to prepare for ADLs.  OT Frequency: Min 2X/week   Barriers to D/C: Decreased caregiver support             End of Session Equipment Utilized During Treatment: Other (comment) (sling) Nurse Communication: Other (comment) (pt in chair)  Activity Tolerance: Patient limited by lethargy;Patient limited by pain Patient left: in chair;with call bell/phone within reach;with nursing/sitter in room   Time: 0821-0906 OT Time Calculation (min): 45 min Charges:  OT General Charges $OT Visit: 1 Procedure OT Evaluation $Initial OT Evaluation Tier I: 1  Procedure OT Treatments $Self Care/Home Management : 8-22 mins $Therapeutic Exercise: 8-22 mins  Juluis Rainier [phone] 08/23/2013, 10:44 AM

## 2013-08-23 NOTE — Progress Notes (Signed)
Patient unable to void. Stated he has voided twice since surgery but was only "a couple of ounces". Patient with urge to urinate, multiple attempts made without any success. Bladder scan showed >622ml. First In and Out cath done at 0130 drained out 850ml urine. Will continue to monitor.

## 2013-08-24 LAB — GLUCOSE, CAPILLARY
Glucose-Capillary: 140 mg/dL — ABNORMAL HIGH (ref 70–99)
Glucose-Capillary: 145 mg/dL — ABNORMAL HIGH (ref 70–99)
Glucose-Capillary: 127 mg/dL — ABNORMAL HIGH (ref 70–99)
Glucose-Capillary: 180 mg/dL — ABNORMAL HIGH (ref 70–99)

## 2013-08-24 MED ORDER — ASPIRIN 325 MG PO TABS: 325.0000 mg | ORAL_TABLET | Freq: Every day | ORAL | Status: DC

## 2013-08-24 MED ORDER — OXYCODONE HCL ER 10 MG PO T12A: 10.0000 mg | EXTENDED_RELEASE_TABLET | Freq: Two times a day (BID) | ORAL | Status: DC

## 2013-08-24 MED ORDER — HYDROCODONE-ACETAMINOPHEN 10-325 MG PO TABS: 1.0000 | ORAL_TABLET | ORAL | Status: DC | PRN

## 2013-08-24 NOTE — Progress Notes (Signed)
Subjective: Pt stable - pain ok   Objective: Vital signs in last 24 hours: Temp:  [97.9 F (36.6 C)-98.6 F (37 C)] 97.9 F (36.6 C) (05/14 0611) Pulse Rate:  [70-76] 70 (05/14 0611) Resp:  [18] 18 (05/14 0611) BP: (103-122)/(49-55) 122/55 mmHg (05/14 0611) SpO2:  [91 %-95 %] 95 % (05/14 0611)  Intake/Output from previous day: 05/13 0701 - 05/14 0700 In: 1275 [P.O.:1120; I.V.:155] Out: 600 [Urine:600] Intake/Output this shift:    Exam:  Intact pulses distally No cellulitis present  Labs:  Recent Labs  08/21/13 1005  HGB 13.4    Recent Labs  08/21/13 1005  WBC 6.5  RBC 4.39  HCT 41.5  PLT 188    Recent Labs  08/21/13 1005  NA 140  K 4.1  CL 100  CO2 29  BUN 16  CREATININE 1.55*  GLUCOSE 111*  CALCIUM 9.2   No results found for this basename: LABPT, INR,  in the last 72 hours  Assessment/Plan: Ready for snf - OT for rom today   Meredith Pel 08/24/2013, 8:18 AM

## 2013-08-24 NOTE — Progress Notes (Signed)
Utilization review completed.  

## 2013-08-24 NOTE — Clinical Social Work Placement (Addendum)
Clinical Social Work Department  CLINICAL SOCIAL WORK PLACEMENT NOTE  Patient: Scott Willis Account Number: [id number] [place] date: 08/22/13 Clinical Social Worker: Rhea Pink LCSWA Date/time: 08/24/2013 11:30 AM  Clinical Social Work is seeking post-discharge placement for this patient at the following level of care: SKILLED NURSING (*CSW will update this form in Epic as items are completed)  5/14/2015Patient/family provided with [facility] Department of Clinical Social Work's list of facilities offering this level of care within the geographic area requested by the patient (or if unable, by the patient's family).  08/24/2013 Patient/family informed of their freedom to choose among providers that offer the needed level of care, that participate in Medicare, Medicaid or managed care program needed by the patient, have an available bed and are willing to accept the patient.  08/24/2013 Patient/family informed of [facility]' ownership interest in [facility], as well as of the fact that they are under no obligation to receive care at this facility.  PASARR submitted to EDS on existing  PASARR number received from EDS on  FL2 transmitted to all facilities in geographic area requested by pt/family on 08/24/2013  FL2 transmitted to all facilities within larger geographic area on  Patient informed that his/her managed care company has contracts with or will negotiate with certain facilities, including the following:  08/24/2013  Patient chooses bed at [facility]  Physician recommends and patient chooses bed at  Patient to be transferred to on 08/25/13 Patient to be transferred to facility by [facility] The following physician request were entered in Epic:  Additional Comments:

## 2013-08-24 NOTE — Progress Notes (Signed)
Bladder scan 223 ml. Encouraged po fluids.

## 2013-08-24 NOTE — Progress Notes (Signed)
Patient with decreased U.O. - addressed with Dr Marlou Sa - states patient normally has decreased U.O. - no cath or IVF needed. Patient taking po okay and without discomfort.

## 2013-08-24 NOTE — Progress Notes (Addendum)
Pt has not been able to void. Bladder scan at 0430 showed only 166 ml in bladder. Pt is drinking fluids when awake, but he has been sleeping most of the night. He states that difficulty voiding has occurred during previous hospitalizations and that he had a suprapubic catheter and a "red catheter" placed at different times.He states that he normally voids only a few times during the day.  Pt requested that he be allowed to try to void on his own later.

## 2013-08-24 NOTE — Progress Notes (Signed)
Occupational Therapy Treatment Patient Details Name: Scott Willis MRN: [id number] DOB: Mar 14, 1941 Today's Date: 08/24/2013    History of present illness Pt is 73 y.o Male s/p Right shoulder  arthroscopy, debridement, biceps tendon release, subacromial decompression, and mini open rotator cuff repair.   OT comments  Pt seen today for therapeutic exercise of LUE and ADLs. Pt requires max verbal cues to attend to task and is limited by pain, however completed ROM of LUE. Pt would continue to benefit from skilled OT and d/c to SNF for rehab.   Follow Up Recommendations  SNF;Supervision/Assistance - 24 hour    Equipment Recommendations  Other (comment) (Defer to SNF)       Precautions / Restrictions Precautions Precautions: Shoulder Type of Shoulder Precautions: No shoulder ROM ordered; per MD order ADL and Sling education. Shoulder Interventions: Shoulder sling/immobilizer;At all times;Off for dressing/bathing/exercises Precaution Booklet Issued: Yes (comment) Precaution Comments: Educated pt on precautions and incorporating into ADLs.  Required Braces or Orthoses: Sling Restrictions Weight Bearing Restrictions: Yes LUE Weight Bearing: Non weight bearing       Mobility Bed Mobility               General bed mobility comments: Pt sitting in recliner for duration of session.  Transfers                 General transfer comment: Pt sitting in recliner for duration of session.        ADL   Eating/Feeding: Set up;Sitting   Grooming: Set up;Sitting           Upper Body Dressing : Maximal assistance;Sitting (total A for sling management)                                      Cognition  Arousal/Alert: Awake/Alert Behavior During Therapy: Impulsive Overall Cognitive Status: Within Functional Limits for tasks assessed       Memory: Decreased recall of precautions;Decreased short-term memory                 Exercises Shoulder  Exercises Elbow Flexion: AAROM;Left;10 reps;Seated Elbow Extension: AAROM;Left;10 reps;Seated Wrist Flexion: AROM;Left;10 reps;Seated Wrist Extension: AROM;Left;10 reps;Seated Digit Composite Flexion: AROM;Left;10 reps;Seated Composite Extension: AROM;Left;10 reps;Seated Donning/doffing sling/immobilizer: Maximal assistance ROM for elbow, wrist and digits of operated UE: Minimal assistance Sling wearing schedule (on at all times/off for ADL's): Minimal assistance Proper positioning of operated UE when showering: Minimal assistance Positioning of UE while sleeping: Minimal assistance   Shoulder Instructions Shoulder Instructions Donning/doffing sling/immobilizer: Maximal assistance ROM for elbow, wrist and digits of operated UE: Minimal assistance Sling wearing schedule (on at all times/off for ADL's): Minimal assistance Proper positioning of operated UE when showering: Minimal assistance Positioning of UE while sleeping: Minimal assistance          Pertinent Vitals/ Pain       Pt reports pain as "bad," did not provide pain score. RN notified. Repositioned with ice pack.          Frequency Min 2X/week     Progress Toward Goals  OT Goals(current goals can now be found in the care plan section)  Progress towards OT goals: Progressing toward goals     Plan Discharge plan remains appropriate       End of Session Equipment Utilized During Treatment: Other (comment) (sling)   Activity Tolerance Patient limited by pain   Patient Left in chair;with call  bell/phone within reach   Nurse Communication          Time: 1710-1736 OT Time Calculation (min): 26 min  Charges: OT General Charges $OT Visit: 1 Procedure OT Treatments $Self Care/Home Management : 8-22 mins $Therapeutic Exercise: 8-22 mins  Juluis Rainier [phone] 08/24/2013, 5:55 PM

## 2013-08-24 NOTE — Discharge Summary (Signed)
Physician Discharge Summary  Patient ID: Scott Willis MRN: [id number] DOB/AGE: Feb 24, 1941 73 y.o.  Admit date: 08/22/2013 Discharge date: 08/24/2013  Admission Diagnoses:  Active Problems:   Rotator cuff tear   Discharge Diagnoses:  Same  Surgeries: Procedure(s):  LEFT SHOULDER DIAGNOSTIC OPERATIVE ARTHROSCOPY ,EXTENSIVE DEBRIDEMENT, BICEPS RELEASE AND TENODESIS, ROTATOR CUFF REPAIR, SUBACROMIAL DECOMPRESSION on 08/22/2013   Consultants:    Discharged Condition: Stable  Hospital Course: OTILIO GROLEAU is an 73 y.o. male who was admitted 08/22/2013 with a chief complaint of eft shoulder pain, and found to have a diagnosis of rotator cuff tear.  They were brought to the operating room on 08/22/2013 and underwent the above named procedures.  He is dced to snf for rehab on left shoulder.  Antibiotics given:  Anti-infectives   Start     Dose/Rate Route Frequency Ordered Stop   08/22/13 1300  ceFAZolin (ANCEF) IVPB 1 g/50 mL premix     1 g 100 mL/hr over 30 Minutes Intravenous Every 6 hours 08/22/13 1259 08/22/13 1908   08/22/13 0600  ceFAZolin (ANCEF) IVPB 2 g/50 mL premix     2 g 100 mL/hr over 30 Minutes Intravenous On call to O.R. 08/21/13 1948 08/22/13 8469    .  Recent vital signs:  Filed Vitals:   08/24/13 0611  BP: 122/55  Pulse: 70  Temp: 97.9 F (36.6 C)  Resp: 18    Recent laboratory studies:  Results for orders placed during the hospital encounter of 08/22/13  GLUCOSE, CAPILLARY      Result Value Ref Range   Glucose-Capillary 100 (*) 70 - 99 mg/dL  GLUCOSE, CAPILLARY      Result Value Ref Range   Glucose-Capillary 138 (*) 70 - 99 mg/dL   Comment 1 Notify RN    GLUCOSE, CAPILLARY      Result Value Ref Range   Glucose-Capillary 98  70 - 99 mg/dL  GLUCOSE, CAPILLARY      Result Value Ref Range   Glucose-Capillary 121 (*) 70 - 99 mg/dL  GLUCOSE, CAPILLARY      Result Value Ref Range   Glucose-Capillary 132 (*) 70 - 99 mg/dL   Comment 1  Documented in Chart     Comment 2 Notify RN    GLUCOSE, CAPILLARY      Result Value Ref Range   Glucose-Capillary 111 (*) 70 - 99 mg/dL  GLUCOSE, CAPILLARY      Result Value Ref Range   Glucose-Capillary 129 (*) 70 - 99 mg/dL  GLUCOSE, CAPILLARY      Result Value Ref Range   Glucose-Capillary 140 (*) 70 - 99 mg/dL    Discharge Medications:     Medication List    ASK your doctor about these medications       diazepam 5 MG tablet  Commonly known as:  VALIUM  - Take 5 mg by mouth once. 1 by mouth on the day of eye surgery ONCE every 10 weeks   - Dr.[name]     finasteride 5 MG tablet  Commonly known as:  PROSCAR  Take 5 mg by mouth daily.     gabapentin 600 MG tablet  Commonly known as:  NEURONTIN  Take 600 mg by mouth 3 (three) times daily.     HYDROcodone-acetaminophen 5-325 MG per tablet  Commonly known as:  NORCO/VICODIN  Take 1 tablet by mouth once. 1 by mouth every 10 weeks prior to eye surgery (30 min prior to surgery)     moxifloxacin 0.5 %  ophthalmic solution  Commonly known as:  VIGAMOX  1 drop 3 (three) times daily.     pioglitazone 30 MG tablet  Commonly known as:  ACTOS  Take 30 mg by mouth daily. Dr.[name]     PRESERVISION AREDS PO  Take 1 tablet by mouth 4 (four) times daily.     sitaGLIPtin 100 MG tablet  Commonly known as:  JANUVIA  Take 100 mg by mouth daily.     SYSTANE OP  Apply to eye as needed.     tamsulosin 0.4 MG Caps capsule  Commonly known as:  FLOMAX  Take 0.4 mg by mouth daily.     testosterone cypionate 100 MG/ML injection  Commonly known as:  DEPO-TESTOSTERONE  Inject 1 mL (100 mg total) into the muscle every 28 (twenty-eight) days. For IM use only and Generic is fine     trimethoprim-polymyxin b ophthalmic solution  Commonly known as:  POLYTRIM  Place 1 drop into the right eye every 4 (four) hours.     venlafaxine XR 75 MG 24 hr capsule  Commonly known as:  EFFEXOR-XR  Take 75 mg by mouth daily with breakfast.      Vitamin D (Cholecalciferol) 1000 UNITS Tabs  Take 1,000 Units by mouth daily.     Vitamin D (Ergocalciferol) 50000 UNITS Caps capsule  Commonly known as:  DRISDOL  Take 1 capsule (50,000 Units total) by mouth every 30 (thirty) days. For osteoporosis        Diagnostic Studies: No results found.  Disposition: 01-Home or Self Care       Future Appointments Provider Department Dept Phone   08/28/2013 7:30 AM Hayden Pedro, MD [facility] [phone]   10/30/2013 3:30 PM Gayland Curry, [facility] [phone]         Signed: Meredith Pel 08/24/2013, 8:19 AM

## 2013-08-25 ENCOUNTER — Encounter (HOSPITAL_COMMUNITY): Payer: Self-pay | Admitting: Orthopedic Surgery

## 2013-08-25 LAB — GLUCOSE, CAPILLARY
Glucose-Capillary: 100 mg/dL — ABNORMAL HIGH (ref 70–99)
Glucose-Capillary: 134 mg/dL — ABNORMAL HIGH (ref 70–99)

## 2013-08-25 NOTE — Progress Notes (Signed)
Scott Willis is being discharged to [facility] for further rehabilitation post Left shoulder arthroscopy 5/12. He is being transported via non-emergent ambulance.

## 2013-08-25 NOTE — Progress Notes (Signed)
Subjective: Pt stable - pain ok - ok for ambulation - needs shoulder range of motion prom and aarom   Objective: Vital signs in last 24 hours: Temp:  [97.6 F (36.4 C)-98.5 F (36.9 C)] 98.5 F (36.9 C) (05/15 0526) Pulse Rate:  [66-80] 79 (05/15 0526) Resp:  [18] 18 (05/15 0526) BP: (112-124)/(48-58) 114/48 mmHg (05/15 0526) SpO2:  [92 %-98 %] 92 % (05/15 0526)  Intake/Output from previous day: 05/14 0701 - 05/15 0700 In: 950 [P.O.:950] Out: -  Intake/Output this shift:    Exam:  Sensation intact distally Intact pulses distally  Labs: No results found for this basename: HGB,  in the last 72 hours No results found for this basename: WBC, RBC, HCT, PLT,  in the last 72 hours No results found for this basename: NA, K, CL, CO2, BUN, CREATININE, GLUCOSE, CALCIUM,  in the last 72 hours No results found for this basename: LABPT, INR,  in the last 72 hours  Assessment/Plan: Plan snf today - ok for shoulder rom   Meredith Pel 08/25/2013, 7:52 AM

## 2013-08-27 NOTE — Clinical Social Work Psychosocial (Signed)
Clinical Social Work Department  BRIEF PSYCHOSOCIAL ASSESSMENT  Patient:Scott Willis  Account Number: [id number]   Admit date: 08/22/12 Clinical Social Worker Amy Stuckey, MSW Date/Time: 08/24/13 Referred by: Physician Date Referred: NA Referred for   SNF Placement   Other Referral:  Interview type: Patient  Other interview type: PSYCHOSOCIAL DATA  Living Status: Alone at the workshop Admitted from facility:  Level of care:  Primary support name: Ronn Wilson Primary support relationship to patient: Friend Degree of support available:  Fair  CURRENT CONCERNS  Current Concerns   Post-Acute Placement   Other Concerns:  SOCIAL WORK ASSESSMENT / PLAN  CSW met with pt at bedside to offer support and discuss SNF placement. 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 Patient had a strange affect and patient went from happy to angry within minutes. Patient was however agreeable to SNF. Patient reported [facility] as his preference. Patient expressed his appreciation for CSW asssit re: PT recommendation for SNF.   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   Pt reports [facility]  as his preference    CSW completed FL2 and initiated SNF search.     Assessment/plan status: Information/Referral to Community Resources  Other assessment/ plan:  Information/referral to community resources:  SNF   PTAR  PATIENT'S/FAMILY'S RESPONSE TO PLAN OF CARE:  Pt  reports he is agreeable to ST SNF in order to increase strength and independence with mobility prior to returning home  Pt verbalized understanding of placement process and appreciation for CSW assist.   Amy Stuckey, MSW, LCSWA [phone] 

## 2013-08-27 NOTE — Progress Notes (Signed)
Clinical social worker assisted with patient discharge to skilled nursing facility, [facility].  CSW addressed all family questions and concerns. CSW copied chart and added all important documents. CSW also set up patient transportation with [facility]. Clinical Social Worker will sign off for now as social work intervention is no longer needed.   Lynlee Stratton, MSW, LCSWA [phone] 

## 2013-08-28 ENCOUNTER — Non-Acute Institutional Stay (SKILLED_NURSING_FACILITY): Payer: No Typology Code available for payment source | Admitting: Adult Health

## 2013-08-28 ENCOUNTER — Encounter: Payer: Self-pay | Admitting: *Deleted

## 2013-08-28 ENCOUNTER — Encounter: Payer: Self-pay | Admitting: Adult Health

## 2013-08-28 ENCOUNTER — Encounter (INDEPENDENT_AMBULATORY_CARE_PROVIDER_SITE_OTHER): Payer: No Typology Code available for payment source | Admitting: Ophthalmology

## 2013-08-28 ENCOUNTER — Other Ambulatory Visit: Payer: Self-pay | Admitting: *Deleted

## 2013-08-28 DIAGNOSIS — N400 Enlarged prostate without lower urinary tract symptoms: Secondary | ICD-10-CM

## 2013-08-28 DIAGNOSIS — M75100 Unspecified rotator cuff tear or rupture of unspecified shoulder, not specified as traumatic: Secondary | ICD-10-CM

## 2013-08-28 DIAGNOSIS — R7989 Other specified abnormal findings of blood chemistry: Secondary | ICD-10-CM

## 2013-08-28 DIAGNOSIS — F329 Major depressive disorder, single episode, unspecified: Secondary | ICD-10-CM

## 2013-08-28 DIAGNOSIS — IMO0002 Reserved for concepts with insufficient information to code with codable children: Secondary | ICD-10-CM

## 2013-08-28 DIAGNOSIS — F32A Depression, unspecified: Secondary | ICD-10-CM

## 2013-08-28 DIAGNOSIS — E1165 Type 2 diabetes mellitus with hyperglycemia: Secondary | ICD-10-CM

## 2013-08-28 DIAGNOSIS — S43429A Sprain of unspecified rotator cuff capsule, initial encounter: Secondary | ICD-10-CM

## 2013-08-28 DIAGNOSIS — F3289 Other specified depressive episodes: Secondary | ICD-10-CM

## 2013-08-28 DIAGNOSIS — E291 Testicular hypofunction: Secondary | ICD-10-CM

## 2013-08-28 DIAGNOSIS — E1139 Type 2 diabetes mellitus with other diabetic ophthalmic complication: Secondary | ICD-10-CM

## 2013-08-28 MED ORDER — OXYCODONE HCL ER 10 MG PO T12A: EXTENDED_RELEASE_TABLET | ORAL | Status: DC

## 2013-08-28 NOTE — Telephone Encounter (Signed)
[facility]

## 2013-08-29 ENCOUNTER — Non-Acute Institutional Stay (SKILLED_NURSING_FACILITY): Payer: No Typology Code available for payment source | Admitting: Internal Medicine

## 2013-08-29 DIAGNOSIS — M75100 Unspecified rotator cuff tear or rupture of unspecified shoulder, not specified as traumatic: Secondary | ICD-10-CM

## 2013-08-29 DIAGNOSIS — G609 Hereditary and idiopathic neuropathy, unspecified: Secondary | ICD-10-CM

## 2013-08-29 DIAGNOSIS — IMO0002 Reserved for concepts with insufficient information to code with codable children: Secondary | ICD-10-CM

## 2013-08-29 DIAGNOSIS — R1314 Dysphagia, pharyngoesophageal phase: Secondary | ICD-10-CM

## 2013-08-29 DIAGNOSIS — E1139 Type 2 diabetes mellitus with other diabetic ophthalmic complication: Secondary | ICD-10-CM

## 2013-08-29 DIAGNOSIS — E1165 Type 2 diabetes mellitus with hyperglycemia: Secondary | ICD-10-CM

## 2013-08-29 DIAGNOSIS — S43429A Sprain of unspecified rotator cuff capsule, initial encounter: Secondary | ICD-10-CM

## 2013-08-29 NOTE — Progress Notes (Signed)
HISTORY & PHYSICAL  DATE: 08/29/2013   FACILITY: [facility]  LEVEL OF CARE: SNF (31)  ALLERGIES:  Allergies  Allergen Reactions  . Bee Pollen Anaphylaxis  . Tape     Any medical tape, Rash   . Flurox [Fluorescein-Benoxinate]     Rash   . Metformin And Related     Severe Diarrhea   . Morphine And Related     Kidney failure   . Statins     Loss of appetite    CHIEF COMPLAINT:  Manage left rotator cuff tear, diabetes mellitus and neuropathy  HISTORY OF PRESENT ILLNESS: Patient is a 73 year old Caucasian male.  ROTATOR CUFF TEAR: Patient was having left shoulder pain and was found to have a rotator cuff tear. He underwent operative arthroscopy, extensive debridement, biceps release and tenodesis, rotator cuff repair, subacromial decompression and tolerated the procedure well. He is admitted to this facility for short-term rehabilitation. He denies shoulder pain currently. He is tolerating his pain medications without any side effects.  DM:pt's DM remains stable.  Pt denies polyuria, polydipsia, polyphagia, changes in vision or hypoglycemic episodes.  No complications noted from the medication presently being used.  Last hemoglobin A1c is: Not available.  PERIPHERAL NEUROPATHY: The peripheral neuropathy is stable. The patient denies pain in the feet, tingling, and numbness. No complications noted from the medication presently being used.  PAST MEDICAL HISTORY :  Past Medical History  Diagnosis Date  . Macular degeneration   . Hyperlipidemia LDL goal < 100   . Benign essential hypertension   . Osteoporosis due to androgen therapy   . Low serum testosterone level   . BPH (benign prostatic hyperplasia)   . Blepharitis, squamous     Right Eye   . DM (diabetes mellitus) type II uncontrolled with eye manifestation     fasting 110s  . Kidney stones   . Arthritis   . Blindness   . Ulcer     gastric    PAST SURGICAL HISTORY: Past Surgical  History  Procedure Laterality Date  . Back surgery  1998    Dr.Nitka  . Refractive surgery  09/2005    Macular Degeneration Dr.Mathews   . Back surgery  03/2006    Removed Rods Dr.Nitka   . Carpal tunnel release  08/2006  . Cataract extraction  02/21/2007    Dr.Epps   . Kidney stone surgery  08/11/2007    Dr.Tannenbaum   . Carpal tunnel release  08/2006    Dr.Nitka   . Lithotripsy  08/11/2007    Dr.Nesi  . Eye surgery  2013    Right eye every 8 weeks   . Shoulder arthroscopy with subacromial decompression, rotator cuff repair and bicep tendon repair Left 08/22/2013    Procedure:  LEFT SHOULDER DIAGNOSTIC OPERATIVE ARTHROSCOPY ,EXTENSIVE DEBRIDEMENT, BICEPS RELEASE AND TENODESIS, ROTATOR CUFF REPAIR, SUBACROMIAL DECOMPRESSION;  Surgeon: Meredith Pel, MD;  Location: [facility];  Service: Orthopedics;  Laterality: Left;  LEFT SHOULDER DOA,EXTENSIVE DEBRIDEMENT, BICEPS RELEASE AND TENODESIS, ROTATOR CUFF REPAIR, SUBACROMIAL DECOMPRESSION,    SOCIAL HISTORY:  reports that he has been smoking Cigarettes.  He has a 88.5 pack-year smoking history. He does not have any smokeless tobacco history on file. He reports that he does not drink alcohol or use illicit drugs.  FAMILY HISTORY:  Family History  Problem Relation Age of Onset  . Colon cancer Father   . Alzheimer's disease Mother   . Diabetes Sister  CURRENT MEDICATIONS: Reviewed per MAR/see medication list  REVIEW OF SYSTEMS: GI: Complains of difficulty swallowing and food sticking in the chest as well as regurgitation, See HPI otherwise 14 point ROS is negative.  PHYSICAL EXAMINATION  VS:  See VS section  GENERAL: no acute distress, normal body habitus EYES: conjunctivae normal, sclerae normal, normal eye lids MOUTH/THROAT: lips without lesions,no lesions in the mouth,tongue is without lesions,uvula elevates in midline NECK: supple, trachea midline, no neck masses, no thyroid tenderness, no thyromegaly LYMPHATICS: no LAN  in the neck, no supraclavicular LAN RESPIRATORY: breathing is even & unlabored, BS CTAB CARDIAC: RRR, no murmur,no extra heart sounds, no edema GI:  ABDOMEN: abdomen soft, normal BS, no masses, no tenderness  LIVER/SPLEEN: no hepatomegaly, no splenomegaly MUSCULOSKELETAL: HEAD: normal to inspection & palpation BACK: no kyphosis, scoliosis or spinal processes tenderness EXTREMITIES: LEFT UPPER EXTREMITY: In sling RIGHT UPPER EXTREMITY:  full range of motion, normal strength & tone LEFT LOWER EXTREMITY:  full range of motion, normal strength & tone RIGHT LOWER EXTREMITY:  full range of motion, normal strength & tone PSYCHIATRIC: the patient is alert & oriented to person, affect & behavior appropriate  LABS/RADIOLOGY:  Labs reviewed: Basic Metabolic Panel:  Recent Labs  07/31/13 0946 08/21/13 1005  NA 139 140  K 3.7 4.1  CL 97 100  CO2 25 29  GLUCOSE 132* 111*  BUN 18 16  CREATININE 1.36* 1.55*  CALCIUM 9.4 9.2   Liver Function Tests:  Recent Labs  07/31/13 0946  AST 15  ALT 9  ALKPHOS 55  BILITOT 0.4  PROT 7.5   CBC:  Recent Labs  04/11/13 1547 07/31/13 0946 08/21/13 1005  WBC 7.3 7.5 6.5  NEUTROABS 3.3 4.4  --   HGB 14.9 15.1 13.4  HCT 43.0 44.4 41.5  MCV 90 91 94.5  PLT 199 237 188   CBG:  Recent Labs  08/24/13 2154 08/25/13 0620 08/25/13 1122  GLUCAP 180* 100* 134*   LEFT SHOULDER - 2+ VIEW   COMPARISON:  None.   FINDINGS: Degenerative change. No acute bony or joint abnormality. L left rib fractures.   IMPRESSION: Negative.   ASSESSMENT/PLAN:  Left rotator cuff tear-status post repair. Continue rehabilitation. Diabetes mellitus-continue Actos and Januvia. Neuropathy-continue Neurontin Esophageal dysphagia-new problem. Obtain ST consult GERD-new problem. Start Prilosec 20 mg daily. BPH-continue Flomax and Proscar Depression-continue Effexor Check CBC and BMP  I have reviewed patient's medical records received at admission/from  hospitalization.  CPT CODE: 16109  Gayani Y Dasanayaka, [place] [phone]

## 2013-08-31 ENCOUNTER — Encounter (INDEPENDENT_AMBULATORY_CARE_PROVIDER_SITE_OTHER): Payer: No Typology Code available for payment source | Admitting: Ophthalmology

## 2013-08-31 DIAGNOSIS — H35039 Hypertensive retinopathy, unspecified eye: Secondary | ICD-10-CM

## 2013-08-31 DIAGNOSIS — H43819 Vitreous degeneration, unspecified eye: Secondary | ICD-10-CM

## 2013-08-31 DIAGNOSIS — H353290 Exudative age-related macular degeneration, unspecified eye, stage unspecified: Secondary | ICD-10-CM

## 2013-08-31 DIAGNOSIS — H3530 Unspecified macular degeneration: Secondary | ICD-10-CM

## 2013-08-31 DIAGNOSIS — I10 Essential (primary) hypertension: Secondary | ICD-10-CM

## 2013-09-11 ENCOUNTER — Encounter: Payer: Self-pay | Admitting: Adult Health

## 2013-09-11 ENCOUNTER — Non-Acute Institutional Stay (SKILLED_NURSING_FACILITY): Payer: No Typology Code available for payment source | Admitting: Adult Health

## 2013-09-11 DIAGNOSIS — R7989 Other specified abnormal findings of blood chemistry: Secondary | ICD-10-CM

## 2013-09-11 DIAGNOSIS — F329 Major depressive disorder, single episode, unspecified: Secondary | ICD-10-CM

## 2013-09-11 DIAGNOSIS — F32A Depression, unspecified: Secondary | ICD-10-CM | POA: Insufficient documentation

## 2013-09-11 DIAGNOSIS — E291 Testicular hypofunction: Secondary | ICD-10-CM

## 2013-09-11 DIAGNOSIS — E1139 Type 2 diabetes mellitus with other diabetic ophthalmic complication: Secondary | ICD-10-CM

## 2013-09-11 DIAGNOSIS — M75100 Unspecified rotator cuff tear or rupture of unspecified shoulder, not specified as traumatic: Secondary | ICD-10-CM

## 2013-09-11 DIAGNOSIS — S43429A Sprain of unspecified rotator cuff capsule, initial encounter: Secondary | ICD-10-CM

## 2013-09-11 DIAGNOSIS — G609 Hereditary and idiopathic neuropathy, unspecified: Secondary | ICD-10-CM

## 2013-09-11 DIAGNOSIS — IMO0002 Reserved for concepts with insufficient information to code with codable children: Secondary | ICD-10-CM

## 2013-09-11 DIAGNOSIS — E1165 Type 2 diabetes mellitus with hyperglycemia: Secondary | ICD-10-CM

## 2013-09-11 DIAGNOSIS — F3289 Other specified depressive episodes: Secondary | ICD-10-CM

## 2013-09-11 DIAGNOSIS — N400 Enlarged prostate without lower urinary tract symptoms: Secondary | ICD-10-CM

## 2013-09-11 NOTE — Progress Notes (Signed)
Patient ID: BOW BUNTYN, male   DOB: 1940/07/19, 73 y.o.   MRN: [id number]               PROGRESS NOTE  DATE: 08/28/2013  FACILITY: Nursing Home Location: [facility]  LEVEL OF CARE: SNF (31)  Acute Visit  CHIEF COMPLAINT:  Follow-up Hospitalization  HISTORY OF PRESENT ILLNESS: This is a 73 year old male who has been admitted to [facility] on 08/25/13 from [facility] with Rotator cuff tear S/P Left shoulder arthroscopy and repair. He has been admitted for a short-term rehabilitation.  REASSESSMENT OF ONGOING PROBLEM(S):  DM:pt's DM remains stable.  Pt denies polyuria, polydipsia, polyphagia, changes in vision or hypoglycemic episodes.  No complications noted from the medication presently being used.   4/15 hemoglobin A1c is: 6.9  BPH: The patient's BPH remains stable. Patient denies urinary hesitancy or dribbling. No complications reported from the current medications being used.  DEPRESSION: The depression remains stable. Patient denies ongoing feelings of sadness, insomnia, anedhonia or lack of appetite. No complications reported from the medications currently being used. Staff do not report behavioral problems.  PAST MEDICAL HISTORY : Reviewed.  No changes/see problem list  CURRENT MEDICATIONS: Reviewed per MAR/see medication list  REVIEW OF SYSTEMS:  GENERAL: no change in appetite, no fatigue, no weight changes, no fever, chills or weakness RESPIRATORY: no cough, SOB, DOE, wheezing, hemoptysis CARDIAC: no chest pain, edema or palpitations GI: no abdominal pain, diarrhea, constipation, heart burn, nausea or vomiting  PHYSICAL EXAMINATION  GENERAL: no acute distress, normal body habitus EYES: conjunctivae normal, sclerae normal, normal eye lids NECK: supple, trachea midline, no neck masses, no thyroid tenderness, no thyromegaly LYMPHATICS: no LAN in the neck, no supraclavicular LAN RESPIRATORY: breathing is even & unlabored, BS  CTAB CARDIAC: RRR, no murmur,no extra heart sounds, no edema GI: abdomen soft, normal BS, no masses, no tenderness, no hepatomegaly, no splenomegaly EXTREMITIES: sling on left shoulder PSYCHIATRIC: the patient is alert & oriented to person, affect & behavior appropriate  LABS/RADIOLOGY: Labs reviewed: Basic Metabolic Panel:  Recent Labs  07/31/13 0946 08/21/13 1005  NA 139 140  K 3.7 4.1  CL 97 100  CO2 25 29  GLUCOSE 132* 111*  BUN 18 16  CREATININE 1.36* 1.55*  CALCIUM 9.4 9.2   Liver Function Tests:  Recent Labs  07/31/13 0946  AST 15  ALT 9  ALKPHOS 55  BILITOT 0.4  PROT 7.5   CBC:  Recent Labs  04/11/13 1547 07/31/13 0946 08/21/13 1005  WBC 7.3 7.5 6.5  NEUTROABS 3.3 4.4  --   HGB 14.9 15.1 13.4  HCT 43.0 44.4 41.5  MCV 90 91 94.5  PLT 199 237 188   CBG:  Recent Labs  08/24/13 2154 08/25/13 0620 08/25/13 1122  GLUCAP 180* 100* 134*   CLINICAL DATA:  Fall.   EXAM: LEFT SHOULDER - 2+ VIEW   COMPARISON:  None.   FINDINGS: Degenerative change. No acute bony or joint abnormality. L left rib fractures.   IMPRESSION: Negative.   ASSESSMENT/PLAN:  Rotator cuff tear S/P Left shoulder arthroscopy and repair - for rehabilitation BPH - stable; continue Finasteride and Flomax Diabetes Mellitus, type 2 - continue Actos and Januvia Low serum testosterone level - continue Testosterone injection monthly Depression - continue Effexor    CPT CODE: 11914  Seth Bake - NP [facility] [phone]

## 2013-09-11 NOTE — Progress Notes (Signed)
Patient ID: LEE KUANG, male   DOB: 08-12-1940, 73 y.o.   MRN: [id number]                 PROGRESS NOTE  DATE: 09/11/13  FACILITY: Nursing Home Location: [facility]  LEVEL OF CARE: SNF (31)  Acute Visit  CHIEF COMPLAINT:  Discharge Notes  HISTORY OF PRESENT ILLNESS: This is a 73 year old male who is for discharge home with Home health PT, OT, Nursing and Home health Aide. He has been admitted to [facility] on 08/25/13 from [facility] with Rotator cuff tear S/P Left shoulder arthroscopy and repair. Patient was admitted to this facility for short-term rehabilitation after the patient's recent hospitalization.  Patient has completed SNF rehabilitation and therapy has cleared the patient for discharge.   REASSESSMENT OF ONGOING PROBLEM(S):  DM:pt's DM remains stable.  Pt denies polyuria, polydipsia, polyphagia, changes in vision or hypoglycemic episodes.  No complications noted from the medication presently being used.   4/15 hemoglobin A1c is: 6.9  PERIPHERAL NEUROPATHY: The peripheral neuropathy is stable. The patient denies pain in the feet, tingling, and numbness. No complications noted from the medication presently being used.  BPH: The patient's BPH remains stable. Patient denies urinary hesitancy or dribbling. No complications reported from the current medications being used.  DEPRESSION: The depression remains stable. Patient denies ongoing feelings of sadness, insomnia, anedhonia or lack of appetite. No complications reported from the medications currently being used. Staff do not report behavioral problems.  PAST MEDICAL HISTORY : Reviewed.  No changes/see problem list  CURRENT MEDICATIONS: Reviewed per MAR/see medication list  REVIEW OF SYSTEMS:  GENERAL: no change in appetite, no fatigue, no weight changes, no fever, chills or weakness RESPIRATORY: no cough, SOB, DOE, wheezing, hemoptysis CARDIAC: no chest pain, edema or palpitations GI:  no abdominal pain, diarrhea, constipation, heart burn, nausea or vomiting  PHYSICAL EXAMINATION  GENERAL: no acute distress, normal body habitus NECK: supple, trachea midline, no neck masses, no thyroid tenderness, no thyromegaly LYMPHATICS: no LAN in the neck, no supraclavicular LAN RESPIRATORY: breathing is even & unlabored, BS CTAB CARDIAC: RRR, no murmur,no extra heart sounds, no edema GI: abdomen soft, normal BS, no masses, no tenderness, no hepatomegaly, no splenomegaly EXTREMITIES: walks in the hallway w/o any assistance; LUE has limited ROM on the shoulder area PSYCHIATRIC: the patient is alert & oriented to person, affect & behavior appropriate  LABS/RADIOLOGY: 08/30/13 WBC 7.2 hemoglobin 10.6 hematocrit 34.3 sodium 137 potassium 4.3 glucose 135 BUN 15 creatinine 1.2 calcium 8.1 Labs reviewed: Basic Metabolic Panel:  Recent Labs  07/31/13 0946 08/21/13 1005  NA 139 140  K 3.7 4.1  CL 97 100  CO2 25 29  GLUCOSE 132* 111*  BUN 18 16  CREATININE 1.36* 1.55*  CALCIUM 9.4 9.2   Liver Function Tests:  Recent Labs  07/31/13 0946  AST 15  ALT 9  ALKPHOS 55  BILITOT 0.4  PROT 7.5   CBC:  Recent Labs  04/11/13 1547 07/31/13 0946 08/21/13 1005  WBC 7.3 7.5 6.5  NEUTROABS 3.3 4.4  --   HGB 14.9 15.1 13.4  HCT 43.0 44.4 41.5  MCV 90 91 94.5  PLT 199 237 188   CBG:  Recent Labs  08/24/13 2154 08/25/13 0620 08/25/13 1122  GLUCAP 180* 100* 134*   CLINICAL DATA:  Fall.   EXAM: LEFT SHOULDER - 2+ VIEW   COMPARISON:  None.   FINDINGS: Degenerative change. No acute bony or joint  abnormality. L left rib fractures.   IMPRESSION: Negative.   ASSESSMENT/PLAN:  Rotator cuff tear S/P Left shoulder arthroscopy and repair - for rehabilitation BPH - stable; continue Finasteride and Flomax Diabetes Mellitus, type 2 - continue Actos and Januvia Low serum testosterone level - continue Testosterone injection monthly Depression - continue  Effexor Peripheral Neuropathy - stable; continue Nerontin   I have filled out patient's discharge paperwork and written prescriptions.  Patient will receive home health PT, OT, Nursing and CNA.   Total discharge time: Less than 30 minutes Discharge time involved coordination of the discharge process with Education officer, museum, nursing staff and therapy department. Medical justification for home health services verified.    CPT CODE: 43154   Seth Bake - NP [facility] [phone]

## 2013-09-16 DIAGNOSIS — E1139 Type 2 diabetes mellitus with other diabetic ophthalmic complication: Secondary | ICD-10-CM

## 2013-09-16 DIAGNOSIS — M25519 Pain in unspecified shoulder: Secondary | ICD-10-CM

## 2013-09-16 DIAGNOSIS — Z4789 Encounter for other orthopedic aftercare: Secondary | ICD-10-CM

## 2013-10-17 ENCOUNTER — Other Ambulatory Visit: Payer: Self-pay

## 2013-10-17 ENCOUNTER — Telehealth: Payer: Self-pay | Admitting: Internal Medicine

## 2013-10-17 MED ORDER — VENLAFAXINE HCL ER 75 MG PO CP24: 75.0000 mg | ORAL_CAPSULE | Freq: Every day | ORAL | Status: DC

## 2013-10-17 NOTE — Telephone Encounter (Signed)
RX sent electronically 

## 2013-10-17 NOTE — Telephone Encounter (Signed)
[employer] form was filled out and completed by Dr. Mariea Clonts.  [employer] confirmed form was received by their office. [facility] faxed a copy of the form along with a request for additional information.   They needed office notes to confirm patients diagnosis.  This information was faxed to [employer]... Forms sent for scanning and copy given to register for up front for patient to pick up.  cdavis

## 2013-10-18 ENCOUNTER — Other Ambulatory Visit: Payer: Self-pay | Admitting: Adult Health

## 2013-10-19 ENCOUNTER — Ambulatory Visit (INDEPENDENT_AMBULATORY_CARE_PROVIDER_SITE_OTHER): Payer: No Typology Code available for payment source | Admitting: Internal Medicine

## 2013-10-19 ENCOUNTER — Encounter: Payer: Self-pay | Admitting: Internal Medicine

## 2013-10-19 VITALS — BP 150/60 | HR 74 | Temp 97.9°F | Resp 22 | Ht 70.0 in | Wt 175.8 lb

## 2013-10-19 DIAGNOSIS — G609 Hereditary and idiopathic neuropathy, unspecified: Secondary | ICD-10-CM

## 2013-10-19 DIAGNOSIS — E1165 Type 2 diabetes mellitus with hyperglycemia: Principal | ICD-10-CM

## 2013-10-19 DIAGNOSIS — E1149 Type 2 diabetes mellitus with other diabetic neurological complication: Secondary | ICD-10-CM

## 2013-10-19 DIAGNOSIS — IMO0002 Reserved for concepts with insufficient information to code with codable children: Secondary | ICD-10-CM

## 2013-10-19 DIAGNOSIS — E1139 Type 2 diabetes mellitus with other diabetic ophthalmic complication: Secondary | ICD-10-CM

## 2013-10-19 NOTE — Progress Notes (Signed)
Patient ID: Scott Willis, male   DOB: [birth year], 73 y.o.   MRN: [id number]   Location:  [facility] / [facility]   Allergies  Allergen Reactions  . Bee Pollen Anaphylaxis  . Tape     Any medical tape, Rash   . Flurox [Fluorescein-Benoxinate]     Rash   . Metformin And Related     Severe Diarrhea   . Morphine And Related     Kidney failure   . Statins     Loss of appetite    Chief Complaint  Patient presents with  . Follow-up    foot check for diabetic shoes    HPI: Patient is a 73 y.o. white male seen in the office today for diabetic foot exam.  Turns out that he did have a diabetic foot exam in April at his last visit.  The paperwork indicates that he has a HISTORY of foot ulcer and foot deformity as I completed it, but does not currently have callouses or pre-ulceration which is NOT a contradiction.  Patient does not recall his diabetic foot exam that I completed, but I have it clearly documented and recall his long, onychomycotic nails.   Also complaining that pharmacy did not get refill request for his depression medication despite 2 requests.     Review of Systems:  Review of Systems  Eyes: Positive for blurred vision.  Musculoskeletal: Positive for falls.  Neurological: Positive for sensory change.  Psychiatric/Behavioral: Positive for depression.    Past Medical History  Diagnosis Date  . Macular degeneration   . Hyperlipidemia LDL goal < 100   . Benign essential hypertension   . Osteoporosis due to androgen therapy   . Low serum testosterone level   . BPH (benign prostatic hyperplasia)   . Blepharitis, squamous     Right Eye   . DM (diabetes mellitus) type II uncontrolled with eye manifestation     fasting 110s  . Kidney stones   . Arthritis   . Blindness   . Ulcer     gastric    Past Surgical History  Procedure Laterality Date  . Back surgery  1998    Dr.Nitka  . Refractive surgery  09/2005    Macular Degeneration  Dr.Mathews   . Back surgery  03/2006    Removed Rods Dr.Nitka   . Carpal tunnel release  08/2006  . Cataract extraction  02/21/2007    Dr.Epps   . Kidney stone surgery  08/11/2007    Dr.Tannenbaum   . Carpal tunnel release  08/2006    Dr.Nitka   . Lithotripsy  08/11/2007    Dr.Nesi  . Eye surgery  2013    Right eye every 8 weeks   . Shoulder arthroscopy with subacromial decompression, rotator cuff repair and bicep tendon repair Left 08/22/2013    Procedure:  LEFT SHOULDER DIAGNOSTIC OPERATIVE ARTHROSCOPY ,EXTENSIVE DEBRIDEMENT, BICEPS RELEASE AND TENODESIS, ROTATOR CUFF REPAIR, SUBACROMIAL DECOMPRESSION;  Surgeon: Meredith Pel, MD;  Location: [place];  Service: Orthopedics;  Laterality: Left;  LEFT SHOULDER DOA,EXTENSIVE DEBRIDEMENT, BICEPS RELEASE AND TENODESIS, ROTATOR CUFF REPAIR, SUBACROMIAL DECOMPRESSION,    Social History:   reports that he has been smoking Cigarettes.  He has a 88.5 pack-year smoking history. He does not have any smokeless tobacco history on file. He reports that he does not drink alcohol or use illicit drugs.  Family History  Problem Relation Age of Onset  . Colon cancer Father   . Alzheimer's  disease Mother   . Diabetes Sister     Medications: Patient's Medications  New Prescriptions   No medications on file  Previous Medications   ASPIRIN 325 MG TABLET    Take 1 tablet (325 mg total) by mouth daily.   DIAZEPAM (VALIUM) 5 MG TABLET    Take 5 mg by mouth once. 1 by mouth on the day of eye surgery ONCE every 10 weeks  Dr.[name]   FINASTERIDE (PROSCAR) 5 MG TABLET    Take 5 mg by mouth daily.   GABAPENTIN (NEURONTIN) 600 MG TABLET    Take 600 mg by mouth 3 (three) times daily.   HYDROCODONE-ACETAMINOPHEN (NORCO) 10-325 MG PER TABLET    Take 1-2 tablets by mouth every 4 (four) hours as needed for moderate pain.   HYDROCODONE-ACETAMINOPHEN (NORCO/VICODIN) 5-325 MG PER TABLET    Take 1 tablet by mouth every 6 (six) hours as needed for moderate pain.  Once a month every 10 weeks prior to eye surgery ( 30 minutes).   MOXIFLOXACIN (VIGAMOX) 0.5 % OPHTHALMIC SOLUTION    1 drop 3 (three) times daily.   MULTIPLE VITAMINS-MINERALS (PRESERVISION AREDS PO)    Take 1 tablet by mouth 4 (four) times daily.    OXYCODONE (OXYCONTIN) 10 MG T12A 12 HR TABLET    Take one tablet by mouth every 12 hours for pain. Do not crush   PIOGLITAZONE (ACTOS) 30 MG TABLET    Take 30 mg by mouth daily. Dr.[name]   POLYETHYL GLYCOL-PROPYL GLYCOL (SYSTANE OP)    Apply to eye as needed.   SITAGLIPTIN (JANUVIA) 100 MG TABLET    Take 100 mg by mouth daily.   TAMSULOSIN (FLOMAX) 0.4 MG CAPS CAPSULE    Take 0.4 mg by mouth daily.   TESTOSTERONE CYPIONATE (DEPO-TESTOSTERONE) 100 MG/ML INJECTION    Inject 1 mL (100 mg total) into the muscle every 28 (twenty-eight) days. For IM use only and Generic is fine   TRIMETHOPRIM-POLYMYXIN B (POLYTRIM) OPHTHALMIC SOLUTION    Place 1 drop into the right eye every 4 (four) hours.   VENLAFAXINE XR (EFFEXOR-XR) 75 MG 24 HR CAPSULE    Take 1 capsule (75 mg total) by mouth daily with breakfast.   VITAMIN D, CHOLECALCIFEROL, 1000 UNITS TABS    Take 1,000 Units by mouth daily.   VITAMIN D, ERGOCALCIFEROL, (DRISDOL) 50000 UNITS CAPS CAPSULE    Take 1 capsule (50,000 Units total) by mouth every 30 (thirty) days. For osteoporosis  Modified Medications   No medications on file  Discontinued Medications   No medications on file     Physical Exam: Filed Vitals:   10/19/13 0745  BP: 150/60  Pulse: 74  Temp: 97.9 F (36.6 C)  TempSrc: Oral  Resp: 22  Height: 5\' 10"  (1.778 m)  Weight: 175 lb 12.8 oz (79.742 kg)  SpO2: 97%  Physical Exam  Constitutional: He is oriented to person, place, and time.  Musculoskeletal:  See diabetic foot exam  Neurological: He is alert and oriented to person, place, and time.    In encounter  Labs reviewed: Basic Metabolic Panel:  Recent Labs  07/31/13 0946 08/21/13 1005  NA 139 140  K 3.7 4.1  CL 97  100  CO2 25 29  GLUCOSE 132* 111*  BUN 18 16  CREATININE 1.36* 1.55*  CALCIUM 9.4 9.2   Liver Function Tests:  Recent Labs  07/31/13 0946  AST 15  ALT 9  ALKPHOS 55  BILITOT 0.4  PROT 7.5  CBC:  Recent Labs  04/11/13 1547 07/31/13 0946 08/21/13 1005  WBC 7.3 7.5 6.5  NEUTROABS 3.3 4.4  --   HGB 14.9 15.1 13.4  HCT 43.0 44.4 41.5  MCV 90 91 94.5  PLT 199 237 188   Lab Results  Component Value Date   HGBA1C 6.9* 07/31/2013   Assessment/Plan 1. DM (diabetes mellitus) type II uncontrolled with eye manifestation -has not been well controlled -his prior PCP sent him to endocrinology recently (unsure how this occurred since pt no longer going there???) -was to see [place] here and podiatry but neither happened last time  2. Type II or unspecified type diabetes mellitus with neurological manifestations, not stated as uncontrolled -pt needs diabetic shoes b/c he has diabetes with peripheral neuropathy, onychomycotic nails and history of foot ulcerations  3. Unspecified hereditary and idiopathic peripheral neuropathy -due to diabetes as above.   -needs diabetic shoes  Next appt:  Keep f/u appt 7/20

## 2013-10-30 ENCOUNTER — Ambulatory Visit (INDEPENDENT_AMBULATORY_CARE_PROVIDER_SITE_OTHER): Payer: No Typology Code available for payment source | Admitting: Internal Medicine

## 2013-10-30 ENCOUNTER — Encounter: Payer: Self-pay | Admitting: Internal Medicine

## 2013-10-30 VITALS — BP 140/64 | HR 76 | Temp 98.0°F | Wt 174.0 lb

## 2013-10-30 DIAGNOSIS — M75102 Unspecified rotator cuff tear or rupture of left shoulder, not specified as traumatic: Secondary | ICD-10-CM

## 2013-10-30 DIAGNOSIS — E1165 Type 2 diabetes mellitus with hyperglycemia: Secondary | ICD-10-CM

## 2013-10-30 DIAGNOSIS — F17200 Nicotine dependence, unspecified, uncomplicated: Secondary | ICD-10-CM

## 2013-10-30 DIAGNOSIS — E291 Testicular hypofunction: Secondary | ICD-10-CM

## 2013-10-30 DIAGNOSIS — IMO0002 Reserved for concepts with insufficient information to code with codable children: Secondary | ICD-10-CM

## 2013-10-30 DIAGNOSIS — E1139 Type 2 diabetes mellitus with other diabetic ophthalmic complication: Secondary | ICD-10-CM

## 2013-10-30 DIAGNOSIS — N400 Enlarged prostate without lower urinary tract symptoms: Secondary | ICD-10-CM

## 2013-10-30 DIAGNOSIS — R7989 Other specified abnormal findings of blood chemistry: Secondary | ICD-10-CM

## 2013-10-30 DIAGNOSIS — E1149 Type 2 diabetes mellitus with other diabetic neurological complication: Secondary | ICD-10-CM

## 2013-10-30 DIAGNOSIS — G609 Hereditary and idiopathic neuropathy, unspecified: Secondary | ICD-10-CM

## 2013-10-30 DIAGNOSIS — S43429A Sprain of unspecified rotator cuff capsule, initial encounter: Secondary | ICD-10-CM

## 2013-10-30 MED ORDER — TESTOSTERONE ENANTHATE 200 MG/ML IM SOLN
100.0000 mg | Freq: Once | INTRAMUSCULAR | Status: AC
  Administered 2013-10-30: 100 mg via INTRAMUSCULAR

## 2013-10-30 NOTE — Progress Notes (Signed)
Patient ID: Scott Willis, male   DOB: 07-10-1940, 73 y.o.   MRN: [id number]   Location:  [facility]  Code Status: full code  Allergies  Allergen Reactions  . Bee Pollen Anaphylaxis  . Tape     Any medical tape, Rash   . Flurox [Fluorescein-Benoxinate]     Rash   . Metformin And Related     Severe Diarrhea   . Morphine And Related     Kidney failure   . Statins     Loss of appetite    Chief Complaint  Patient presents with  . Follow-up    3 month f/u, [employer] form and discuss Genetic Testing results  . other    needs testostrone shot today.    HPI: Patient is a 73 y.o. white male seen in the office today for medical mgt of chronic diseases.    Has not had an abdominal aortic aneurysm screen done ever, and refuses to have one.    Re: rotator cuff surgery:  Still seeing OT three times a week, on "electric chair" regularly (ROM machine).  Reduced doses of pain medications b/c making him sleepy.  Using lidocaine patch on deltoid.    Due for testosterone shot.    Says smoking takes his mind off of his chronic back pain and refuses to quit.    2008 cscope Dr. Amado Coe was recommended for 2013.  Says he had this.    2010 dexa scan  2006 treadmill test by Dr. [name]  Declines shingles/pneumovax  Review of Systems:  Review of Systems  Constitutional: Negative for fever and malaise/fatigue.  HENT: Negative for congestion.   Eyes: Positive for blurred vision.  Respiratory: Negative for shortness of breath.   Cardiovascular: Negative for chest pain and leg swelling.  Gastrointestinal: Negative for abdominal pain.  Genitourinary: Positive for frequency. Negative for dysuria.  Musculoskeletal: Positive for back pain.  Skin: Negative for rash.  Neurological: Negative for dizziness and weakness.  Psychiatric/Behavioral: Negative for depression and memory loss.    Past Medical History  Diagnosis Date  . Macular  degeneration   . Hyperlipidemia LDL goal < 100   . Benign essential hypertension   . Osteoporosis due to androgen therapy   . Low serum testosterone level   . BPH (benign prostatic hyperplasia)   . Blepharitis, squamous     Right Eye   . DM (diabetes mellitus) type II uncontrolled with eye manifestation     fasting 110s  . Kidney stones   . Arthritis   . Blindness   . Ulcer     gastric    Past Surgical History  Procedure Laterality Date  . Back surgery  1998    Dr.Nitka  . Refractive surgery  09/2005    Macular Degeneration Dr.Mathews   . Back surgery  03/2006    Removed Rods Dr.Nitka   . Carpal tunnel release  08/2006  . Cataract extraction  02/21/2007    Dr.Epps   . Kidney stone surgery  08/11/2007    Dr.Tannenbaum   . Carpal tunnel release  08/2006    Dr.Nitka   . Lithotripsy  08/11/2007    Dr.Nesi  . Eye surgery  2013    Right eye every 8 weeks   . Shoulder arthroscopy with subacromial decompression, rotator cuff repair and bicep tendon repair Left 08/22/2013    Procedure:  LEFT SHOULDER DIAGNOSTIC OPERATIVE ARTHROSCOPY ,EXTENSIVE DEBRIDEMENT, BICEPS RELEASE AND TENODESIS, ROTATOR CUFF REPAIR,  SUBACROMIAL DECOMPRESSION;  Surgeon: Meredith Pel, MD;  Location: [place];  Service: Orthopedics;  Laterality: Left;  LEFT SHOULDER DOA,EXTENSIVE DEBRIDEMENT, BICEPS RELEASE AND TENODESIS, ROTATOR CUFF REPAIR, SUBACROMIAL DECOMPRESSION,    Social History:   reports that he has been smoking Cigarettes.  He has a 88.5 pack-year smoking history. He does not have any smokeless tobacco history on file. He reports that he does not drink alcohol or use illicit drugs.  Family History  Problem Relation Age of Onset  . Colon cancer Father   . Alzheimer's disease Mother   . Diabetes Sister     Medications: Patient's Medications  New Prescriptions   No medications on file  Previous Medications   ASPIRIN 325 MG TABLET    Take 1 tablet (325 mg total) by mouth daily.   DIAZEPAM  (VALIUM) 5 MG TABLET    Take 5 mg by mouth once. 1 by mouth on the day of eye surgery ONCE every 10 weeks  Dr.[name]   FINASTERIDE (PROSCAR) 5 MG TABLET    Take 5 mg by mouth daily.   GABAPENTIN (NEURONTIN) 600 MG TABLET    Take 600 mg by mouth 3 (three) times daily.   HYDROCODONE-ACETAMINOPHEN (NORCO/VICODIN) 5-325 MG PER TABLET    Take 1 tablet by mouth every 6 (six) hours as needed for moderate pain. Once a month every 10 weeks prior to eye surgery ( 30 minutes).   LIDOCAINE (LIDODERM) 5 %    Place 1 patch onto the skin daily. Remove & Discard patch within 12 hours or as directed by MD   METHOCARBAMOL (ROBAXIN) 500 MG TABLET    TAKE 1 TABLET EVERY 6 HOURS AS NEEDED   MOXIFLOXACIN (VIGAMOX) 0.5 % OPHTHALMIC SOLUTION    1 drop 3 (three) times daily.   MULTIPLE VITAMINS-MINERALS (PRESERVISION AREDS PO)    Take 1 tablet by mouth 4 (four) times daily.    PIOGLITAZONE (ACTOS) 30 MG TABLET    Take 30 mg by mouth daily. Dr.[name]   POLYETHYL GLYCOL-PROPYL GLYCOL (SYSTANE OP)    Apply to eye as needed.   SITAGLIPTIN (JANUVIA) 100 MG TABLET    Take 100 mg by mouth daily.   TAMSULOSIN (FLOMAX) 0.4 MG CAPS CAPSULE    Take 0.4 mg by mouth daily.   TESTOSTERONE CYPIONATE (DEPO-TESTOSTERONE) 100 MG/ML INJECTION    Inject 1 mL (100 mg total) into the muscle every 28 (twenty-eight) days. For IM use only and Generic is fine   TRIMETHOPRIM-POLYMYXIN B (POLYTRIM) OPHTHALMIC SOLUTION    Place 1 drop into the right eye every 4 (four) hours.   VENLAFAXINE XR (EFFEXOR-XR) 75 MG 24 HR CAPSULE    Take 1 capsule (75 mg total) by mouth daily with breakfast.   VITAMIN D, CHOLECALCIFEROL, 1000 UNITS TABS    Take 1,000 Units by mouth daily.   VITAMIN D, ERGOCALCIFEROL, (DRISDOL) 50000 UNITS CAPS CAPSULE    Take 1 capsule (50,000 Units total) by mouth every 30 (thirty) days. For osteoporosis  Modified Medications   No medications on file  Discontinued Medications   HYDROCODONE-ACETAMINOPHEN (NORCO) 10-325 MG PER TABLET     Take 1-2 tablets by mouth every 4 (four) hours as needed for moderate pain.   OXYCODONE (OXYCONTIN) 10 MG T12A 12 HR TABLET    Take one tablet by mouth every 12 hours for pain. Do not crush     Physical Exam: Filed Vitals:   10/30/13 1553  BP: 140/64  Pulse: 76  Temp: 98 F (36.7 C)  TempSrc: Oral  Weight: 174 lb (78.926 kg)  SpO2: 99%  Physical Exam  Constitutional: He is oriented to person, place, and time. No distress.  Cardiovascular: Normal rate, regular rhythm, normal heart sounds and intact distal pulses.   Pulmonary/Chest: Effort normal and breath sounds normal.  Abdominal: Soft. Bowel sounds are normal. He exhibits no distension and no mass. There is no tenderness.  Musculoskeletal: He exhibits tenderness.  decreased ROM left shoulder--still cannot elevate over 90  Neurological: He is alert and oriented to person, place, and time.  Skin: Skin is warm and dry.    Labs reviewed: Basic Metabolic Panel:  Recent Labs  07/31/13 0946 08/21/13 1005  NA 139 140  K 3.7 4.1  CL 97 100  CO2 25 29  GLUCOSE 132* 111*  BUN 18 16  CREATININE 1.36* 1.55*  CALCIUM 9.4 9.2   Liver Function Tests:  Recent Labs  07/31/13 0946  AST 15  ALT 9  ALKPHOS 55  BILITOT 0.4  PROT 7.5  CBC:  Recent Labs  04/11/13 1547 07/31/13 0946 08/21/13 1005  WBC 7.3 7.5 6.5  NEUTROABS 3.3 4.4  --   HGB 14.9 15.1 13.4  HCT 43.0 44.4 41.5  MCV 90 91 94.5  PLT 199 237 188   Lab Results  Component Value Date   HGBA1C 6.9* 07/31/2013   Assessment/Plan 1. Low serum testosterone level -testosterone level rechecked--to get monthly injections  2. DM (diabetes mellitus) type II uncontrolled with eye manifestation -cont januvia, actos as per his endocrinologist -follows with ophtho--gets eye injections for his macular degeneration--needs valium before these -cont eye drops  3. Type II or unspecified type diabetes mellitus with neurological manifestations, not stated as  uncontrolled -cont current treatment per endo  4. Unspecified hereditary and idiopathic peripheral neuropathy -cont gabapentin  5. Rotator cuff tear, left -cont therapy as per orthopedics, suspect he's not healing well due to his ongoing tobacco abuse -cont oxycodone prn also through ortho  6. BPH (benign prostatic hyperplasia) -cont proscar, flomax for this -follows with Dr. Janice Norrie  7. Tobacco use disorder -encouraged to quit, but he has NO plans to do so.  Labs/tests ordered:   Orders Placed This Encounter  Procedures  . Testosterone, Free, Total    Standing Status: Future     Number of Occurrences:      Standing Expiration Date: 05/02/2014  . CBC with Differential    Standing Status: Future     Number of Occurrences:      Standing Expiration Date: 05/02/2014  . Basic metabolic panel    Standing Status: Future     Number of Occurrences:      Standing Expiration Date: 05/02/2014  sees Dr. Janice Norrie just once a year  Next appt:  3 mos with labs before

## 2013-11-20 ENCOUNTER — Encounter: Payer: Self-pay | Admitting: Podiatry

## 2013-11-20 ENCOUNTER — Ambulatory Visit (INDEPENDENT_AMBULATORY_CARE_PROVIDER_SITE_OTHER): Payer: No Typology Code available for payment source | Admitting: Podiatry

## 2013-11-20 VITALS — BP 140/88 | HR 69 | Resp 15 | Ht 70.5 in | Wt 174.0 lb

## 2013-11-20 DIAGNOSIS — M79609 Pain in unspecified limb: Secondary | ICD-10-CM

## 2013-11-20 DIAGNOSIS — M79676 Pain in unspecified toe(s): Secondary | ICD-10-CM

## 2013-11-20 DIAGNOSIS — B351 Tinea unguium: Secondary | ICD-10-CM

## 2013-11-20 DIAGNOSIS — E1149 Type 2 diabetes mellitus with other diabetic neurological complication: Secondary | ICD-10-CM

## 2013-11-20 NOTE — Progress Notes (Signed)
   Subjective:    Patient ID: Scott Willis, male    DOB: [birth year], 73 y.o.   MRN: [id number]  HPI Comments: N debridement L 10 toenails D and O long-term C elongated, thickened, encurvated and tenderness A diabetes, macular degeneration, and difficult to cut T none     Review of Systems  Musculoskeletal:       Left shoulder injury and surgery May 2015   All other systems reviewed and are negative.      Objective:   Physical Exam  Orientated x3  Vascular: The DP and PT pulses are 2/4 bilaterally  Neurological: Sensation to 10 g monofilament wire intact 2/5 right and 1/5 left Vibratory sensation nonreactive bilaterally Ankle reflexes equal and reactive bilaterally  Dermatological: The toenails are elongated, hypertrophic, brittle, discolored x10  Musculoskeletal: Tailor's bunion deformity bilaterally Pes planus bilaterally No restriction ankle, subtalar, midtarsal joints bilaterally     Assessment & Plan:   Assessment: Diabetic peripheral neuropathy Satisfactory vascular status Symptomatic onychomycoses 6-10  Plan: Debrided toenails x10 without a bleeding  Reappoint x3 months

## 2013-11-21 ENCOUNTER — Encounter: Payer: Self-pay | Admitting: Podiatry

## 2013-11-21 NOTE — Patient Instructions (Signed)
Diabetes and Foot Care Diabetes may cause you to have problems because of poor blood supply (circulation) to your feet and legs. This may cause the skin on your feet to become thinner, break easier, and heal more slowly. Your skin may become dry, and the skin may peel and crack. You may also have nerve damage in your legs and feet causing decreased feeling in them. You may not notice minor injuries to your feet that could lead to infections or more serious problems. Taking care of your feet is one of the most important things you can do for yourself.  HOME CARE INSTRUCTIONS  Wear shoes at all times, even in the house. Do not go barefoot. Bare feet are easily injured.  Check your feet daily for blisters, cuts, and redness. If you cannot see the bottom of your feet, use a mirror or ask someone for help.  Wash your feet with warm water (do not use hot water) and mild soap. Then pat your feet and the areas between your toes until they are completely dry. Do not soak your feet as this can dry your skin.  Apply a moisturizing lotion or petroleum jelly (that does not contain alcohol and is unscented) to the skin on your feet and to dry, brittle toenails. Do not apply lotion between your toes.  Trim your toenails straight across. Do not dig under them or around the cuticle. File the edges of your nails with an emery board or nail file.  Do not cut corns or calluses or try to remove them with medicine.  Wear clean socks or stockings every day. Make sure they are not too tight. Do not wear knee-high stockings since they may decrease blood flow to your legs.  Wear shoes that fit properly and have enough cushioning. To break in new shoes, wear them for just a few hours a day. This prevents you from injuring your feet. Always look in your shoes before you put them on to be sure there are no objects inside.  Do not cross your legs. This may decrease the blood flow to your feet.  If you find a minor scrape,  cut, or break in the skin on your feet, keep it and the skin around it clean and dry. These areas may be cleansed with mild soap and water. Do not cleanse the area with peroxide, alcohol, or iodine.  When you remove an adhesive bandage, be sure not to damage the skin around it.  If you have a wound, look at it several times a day to make sure it is healing.  Do not use heating pads or hot water bottles. They may burn your skin. If you have lost feeling in your feet or legs, you may not know it is happening until it is too late.  Make sure your health care provider performs a complete foot exam at least annually or more often if you have foot problems. Report any cuts, sores, or bruises to your health care provider immediately. SEEK MEDICAL CARE IF:   You have an injury that is not healing.  You have cuts or breaks in the skin.  You have an ingrown nail.  You notice redness on your legs or feet.  You feel burning or tingling in your legs or feet.  You have pain or cramps in your legs and feet.  Your legs or feet are numb.  Your feet always feel cold. SEEK IMMEDIATE MEDICAL CARE IF:   There is increasing redness,   swelling, or pain in or around a wound.  There is a red line that goes up your leg.  Pus is coming from a wound.  You develop a fever or as directed by your health care provider.  You notice a bad smell coming from an ulcer or wound. Document Released: 03/27/2000 Document Revised: 11/30/2012 Document Reviewed: 09/06/2012 [employer] Patient Information 2015 [employer]. This information is not intended to replace advice given to you by your health care provider. Make sure you discuss any questions you have with your health care provider.  

## 2013-11-22 ENCOUNTER — Ambulatory Visit (INDEPENDENT_AMBULATORY_CARE_PROVIDER_SITE_OTHER): Payer: No Typology Code available for payment source

## 2013-11-22 DIAGNOSIS — E291 Testicular hypofunction: Secondary | ICD-10-CM

## 2013-11-22 MED ORDER — TESTOSTERONE CYPIONATE 100 MG/ML IM SOLN: 100.0000 mg | INTRAMUSCULAR | Status: DC

## 2013-11-22 MED ORDER — TESTOSTERONE CYPIONATE 100 MG/ML IM SOLN
1.0000 mg | INTRAMUSCULAR | Status: DC
  Administered 2013-11-22: 1 mg via INTRAMUSCULAR

## 2013-11-27 ENCOUNTER — Encounter (INDEPENDENT_AMBULATORY_CARE_PROVIDER_SITE_OTHER): Payer: No Typology Code available for payment source | Admitting: Ophthalmology

## 2013-11-27 DIAGNOSIS — H35039 Hypertensive retinopathy, unspecified eye: Secondary | ICD-10-CM

## 2013-11-27 DIAGNOSIS — H353290 Exudative age-related macular degeneration, unspecified eye, stage unspecified: Secondary | ICD-10-CM

## 2013-11-27 DIAGNOSIS — I10 Essential (primary) hypertension: Secondary | ICD-10-CM

## 2013-11-27 DIAGNOSIS — H3530 Unspecified macular degeneration: Secondary | ICD-10-CM

## 2013-11-27 DIAGNOSIS — H43819 Vitreous degeneration, unspecified eye: Secondary | ICD-10-CM

## 2014-01-02 ENCOUNTER — Other Ambulatory Visit: Payer: Self-pay | Admitting: Internal Medicine

## 2014-02-05 ENCOUNTER — Ambulatory Visit: Payer: Self-pay | Admitting: Internal Medicine

## 2014-02-26 ENCOUNTER — Encounter: Payer: Self-pay | Admitting: Podiatry

## 2014-02-26 ENCOUNTER — Ambulatory Visit (INDEPENDENT_AMBULATORY_CARE_PROVIDER_SITE_OTHER): Payer: No Typology Code available for payment source | Admitting: Podiatry

## 2014-02-26 DIAGNOSIS — M79676 Pain in unspecified toe(s): Secondary | ICD-10-CM

## 2014-02-26 DIAGNOSIS — B351 Tinea unguium: Secondary | ICD-10-CM

## 2014-02-27 NOTE — Progress Notes (Signed)
Patient ID: Scott Willis, male   DOB: Aug 06, 1940, 73 y.o.   MRN: [id number]  Subjective This patient presents complaining of painful toenails  Objective: Toenails are hypertrophic, elongated, discolored 6-10  Assessment: Symptomatic onychomycoses 6-10  Plan: Debridement of toenails 10 without a bleeding  Reappoint 3 months

## 2014-03-05 ENCOUNTER — Encounter (INDEPENDENT_AMBULATORY_CARE_PROVIDER_SITE_OTHER): Payer: No Typology Code available for payment source | Admitting: Ophthalmology

## 2014-03-05 DIAGNOSIS — H3532 Exudative age-related macular degeneration: Secondary | ICD-10-CM

## 2014-03-05 DIAGNOSIS — H3531 Nonexudative age-related macular degeneration: Secondary | ICD-10-CM

## 2014-03-05 DIAGNOSIS — H35033 Hypertensive retinopathy, bilateral: Secondary | ICD-10-CM

## 2014-03-05 DIAGNOSIS — H43813 Vitreous degeneration, bilateral: Secondary | ICD-10-CM

## 2014-03-05 DIAGNOSIS — I10 Essential (primary) hypertension: Secondary | ICD-10-CM

## 2014-03-16 ENCOUNTER — Ambulatory Visit (INDEPENDENT_AMBULATORY_CARE_PROVIDER_SITE_OTHER): Payer: No Typology Code available for payment source | Admitting: Internal Medicine

## 2014-03-16 ENCOUNTER — Ambulatory Visit (INDEPENDENT_AMBULATORY_CARE_PROVIDER_SITE_OTHER): Payer: No Typology Code available for payment source

## 2014-03-16 ENCOUNTER — Encounter: Payer: Self-pay | Admitting: Internal Medicine

## 2014-03-16 VITALS — BP 142/80 | HR 81 | Temp 97.8°F | Resp 10 | Ht 70.0 in | Wt 179.4 lb

## 2014-03-16 DIAGNOSIS — Z720 Tobacco use: Secondary | ICD-10-CM

## 2014-03-16 DIAGNOSIS — E1139 Type 2 diabetes mellitus with other diabetic ophthalmic complication: Secondary | ICD-10-CM

## 2014-03-16 DIAGNOSIS — Z23 Encounter for immunization: Secondary | ICD-10-CM

## 2014-03-16 DIAGNOSIS — IMO0002 Reserved for concepts with insufficient information to code with codable children: Secondary | ICD-10-CM

## 2014-03-16 DIAGNOSIS — M75102 Unspecified rotator cuff tear or rupture of left shoulder, not specified as traumatic: Secondary | ICD-10-CM

## 2014-03-16 DIAGNOSIS — E1165 Type 2 diabetes mellitus with hyperglycemia: Secondary | ICD-10-CM

## 2014-03-16 DIAGNOSIS — F17200 Nicotine dependence, unspecified, uncomplicated: Secondary | ICD-10-CM

## 2014-03-16 DIAGNOSIS — H3530 Unspecified macular degeneration: Secondary | ICD-10-CM

## 2014-03-16 DIAGNOSIS — E291 Testicular hypofunction: Secondary | ICD-10-CM

## 2014-03-16 DIAGNOSIS — E349 Endocrine disorder, unspecified: Secondary | ICD-10-CM

## 2014-03-16 MED ORDER — TESTOSTERONE CYPIONATE 100 MG/ML IM SOLN
100.0000 mg | INTRAMUSCULAR | Status: DC
  Administered 2014-03-16: 100 mg via INTRAMUSCULAR

## 2014-03-16 NOTE — Progress Notes (Signed)
Patient ID: Scott Willis, male   DOB: 24-Mar-1941, 73 y.o.   MRN: [id number]   Location:  [facility]  Code Status: full code  Allergies  Allergen Reactions  . Bee Pollen Anaphylaxis  . Tape     Any medical tape, Rash   . Flurox [Fluorescein-Benoxinate]     Rash   . Metformin And Related     Severe Diarrhea   . Morphine And Related     Kidney failure   . Statins     Loss of appetite    Chief Complaint  Patient presents with  . Medical Management of Chronic Issues    HPI: Patient is a 73 y.o. white male seen in the office today for medical mgt of chronic diseases.  He c/o not being able to see me as needed and not getting his testosterone injections for several months (apparently he did not arrange a f/u injection last time).    He continues to smoke.  He has no desire to quit.    He saw Dr. Aram Candela from cardiology and says he was told he's doing well and had an ekg and labs there.  He saw Dr. Zigmund Daniel about his macular degeneration and is now getting his eye injections every 16 instead of every 10 weeks.  He was told his eyes seem stable.  His left shoulder remains painful.  He completed his therapy but does not have complete use of the arm w/o severe pain.  Sees Dr. Marlou Sa in another 1-2 wks.  He may need possible repeat surgery, he says.  His chronic back pain is unchanged.  He finally got his diabetic shoes.  He sees Dr. Amalia Hailey every 10 weeks b/c his toenails grow quickly and he has fungus.  He saw Dr. Elyse Hsu and his last hba1c was 6.9.  He says the diabetic medications are too expensive and he's in the donut hole so he's using 1/2 of januvia instead of a full one.  Glucose running 134, 154, 109 today.  He's been told to check his sugar more than once a day but won't do it.  He has an "allergy" to metformin of diarrhea (probably didn't take with food, but refuses to try it again).  He remains on actos at night. Review of  Systems:  Review of Systems  Constitutional: Negative for fever.  HENT: Negative for congestion.   Eyes: Positive for blurred vision.  Respiratory: Negative for cough and shortness of breath.   Cardiovascular: Negative for chest pain, palpitations and PND.  Gastrointestinal: Negative for abdominal pain, constipation, blood in stool and melena.  Genitourinary: Negative for dysuria.  Musculoskeletal: Negative for falls.  Skin: Negative for rash.  Neurological: Negative for dizziness.  Endo/Heme/Allergies: Does not bruise/bleed easily.  Psychiatric/Behavioral: Negative for depression and memory loss.     Past Medical History  Diagnosis Date  . Macular degeneration   . Hyperlipidemia LDL goal < 100   . Benign essential hypertension   . Osteoporosis due to androgen therapy   . Low serum testosterone level   . BPH (benign prostatic hyperplasia)   . Blepharitis, squamous     Right Eye   . DM (diabetes mellitus) type II uncontrolled with eye manifestation     fasting 110s  . Kidney stones   . Arthritis   . Blindness   . Ulcer     gastric    Past Surgical History  Procedure Laterality Date  . Back surgery  1998    Dr.Nitka  . Refractive surgery  09/2005    Macular Degeneration Dr.Mathews   . Back surgery  03/2006    Removed Rods Dr.Nitka   . Carpal tunnel release  08/2006  . Cataract extraction  02/21/2007    Dr.Epps   . Kidney stone surgery  08/11/2007    Dr.Tannenbaum   . Carpal tunnel release  08/2006    Dr.Nitka   . Lithotripsy  08/11/2007    Dr.Nesi  . Eye surgery  2013    Right eye every 8 weeks   . Shoulder arthroscopy with subacromial decompression, rotator cuff repair and bicep tendon repair Left 08/22/2013    Procedure:  LEFT SHOULDER DIAGNOSTIC OPERATIVE ARTHROSCOPY ,EXTENSIVE DEBRIDEMENT, BICEPS RELEASE AND TENODESIS, ROTATOR CUFF REPAIR, SUBACROMIAL DECOMPRESSION;  Surgeon: Meredith Pel, MD;  Location: [facility];  Service: Orthopedics;  Laterality: Left;   LEFT SHOULDER DOA,EXTENSIVE DEBRIDEMENT, BICEPS RELEASE AND TENODESIS, ROTATOR CUFF REPAIR, SUBACROMIAL DECOMPRESSION,    Social History:   reports that he has been smoking Cigarettes.  He has a 88.5 pack-year smoking history. He does not have any smokeless tobacco history on file. He reports that he does not drink alcohol or use illicit drugs.  Family History  Problem Relation Age of Onset  . Colon cancer Father   . Alzheimer's disease Mother   . Diabetes Sister     Medications: Patient's Medications  New Prescriptions   No medications on file  Previous Medications   DIAZEPAM (VALIUM) 5 MG TABLET    Take 5 mg by mouth once. 1 by mouth on the day of eye surgery ONCE every 16 weeks  Dr.[name]   FINASTERIDE (PROSCAR) 5 MG TABLET    Take 5 mg by mouth daily.   GABAPENTIN (NEURONTIN) 600 MG TABLET    Take 600 mg by mouth 3 (three) times daily.   HYDROCODONE-ACETAMINOPHEN (NORCO/VICODIN) 5-325 MG PER TABLET    Take 1 tablet by mouth every 6 (six) hours as needed for moderate pain. Once a month every 10 weeks prior to eye surgery ( 30 minutes).   LIDOCAINE (LIDODERM) 5 %    Place 1 patch onto the skin daily. Remove & Discard patch within 12 hours or as directed by MD   MOXIFLOXACIN (VIGAMOX) 0.5 % OPHTHALMIC SOLUTION    Place 1 drop into the right eye 3 (three) times daily. Every 16 weeks   PIOGLITAZONE (ACTOS) 30 MG TABLET    Take 30 mg by mouth daily. Dr.[name]   POLYETHYL GLYCOL-PROPYL GLYCOL (SYSTANE OP)    Apply to eye as needed.   SITAGLIPTIN (JANUVIA) 100 MG TABLET    Take 50 mg by mouth daily.    TAMSULOSIN (FLOMAX) 0.4 MG CAPS CAPSULE    Take 0.4 mg by mouth daily.   TESTOSTERONE CYPIONATE (DEPO-TESTOSTERONE) 100 MG/ML INJECTION    Inject 1 mL (100 mg total) into the muscle every 28 (twenty-eight) days. For IM use only and Generic is fine   TRIMETHOPRIM-POLYMYXIN B (POLYTRIM) OPHTHALMIC SOLUTION    Place 1 drop into the right eye every 4 (four) hours.   VENLAFAXINE XR  (EFFEXOR-XR) 75 MG 24 HR CAPSULE    Take 1 capsule (75 mg total) by mouth daily with breakfast.   VITAMIN D, ERGOCALCIFEROL, (DRISDOL) 50000 UNITS CAPS CAPSULE    TAKE 1 CAPSULE BY MOUTH EVERY 30 DAYS. FOR OSTEOPOROSIS  Modified Medications   No medications on file  Discontinued Medications   ASPIRIN 325 MG TABLET    Take 1  tablet (325 mg total) by mouth daily.   METHOCARBAMOL (ROBAXIN) 500 MG TABLET    TAKE 1 TABLET EVERY 6 HOURS AS NEEDED   MULTIPLE VITAMINS-MINERALS (PRESERVISION AREDS PO)    Take 1 tablet by mouth 4 (four) times daily.    VITAMIN D, CHOLECALCIFEROL, 1000 UNITS TABS    Take 1,000 Units by mouth daily.     Physical Exam: Filed Vitals:   03/16/14 1345  BP: 142/80  Pulse: 81  Temp: 97.8 F (36.6 C)  Resp: 10  Height: 5\' 10"  (1.778 m)  Weight: 179 lb 6.4 oz (81.375 kg)  SpO2: 97%  Physical Exam  Constitutional: He is oriented to person, place, and time.  HENT:  Head: Normocephalic and atraumatic.  Eyes:  Legally blind  Neck: Neck supple. No JVD present.  Cardiovascular: Normal rate, regular rhythm, normal heart sounds and intact distal pulses.   Pulmonary/Chest: Effort normal and breath sounds normal. No respiratory distress.  Abdominal: Bowel sounds are normal.  Musculoskeletal:  Decreased ROM left shoulder  Neurological: He is alert and oriented to person, place, and time.  Skin: Skin is warm and dry.  Psychiatric:  Labile mood   Labs reviewed: Basic Metabolic Panel:  Recent Labs  07/31/13 0946 08/21/13 1005  NA 139 140  K 3.7 4.1  CL 97 100  CO2 25 29  GLUCOSE 132* 111*  BUN 18 16  CREATININE 1.36* 1.55*  CALCIUM 9.4 9.2   Liver Function Tests:  Recent Labs  07/31/13 0946  AST 15  ALT 9  ALKPHOS 55  BILITOT 0.4  PROT 7.5   No results for input(s): LIPASE, AMYLASE in the last 8760 hours. No results for input(s): AMMONIA in the last 8760 hours. CBC:  Recent Labs  04/11/13 1547 07/31/13 0946 08/21/13 1005  WBC 7.3 7.5 6.5    NEUTROABS 3.3 4.4  --   HGB 14.9 15.1 13.4  HCT 43.0 44.4 41.5  MCV 90 91 94.5  PLT 199 237 188   Lipid Panel: No results for input(s): CHOL, HDL, LDLCALC, TRIG, CHOLHDL, LDLDIRECT in the last 8760 hours. Lab Results  Component Value Date   HGBA1C 6.9* 07/31/2013   Assessment/Plan 1. Testosterone deficiency - overdue for next injection--given today b/c I don't know when he will actually come next though it is meant to be monthly and in three mos to see me - CBC With differential/Platelet - Comprehensive metabolic panel - testosterone cypionate (DEPOTESTOTERONE CYPIONATE) injection 100 mg; Inject 1 mL (100 mg total) into the muscle every 28 (twenty-eight) days.  2. DM (diabetes mellitus) type II uncontrolled with eye manifestation - had hba1c of 6.9 in October -Comprehensive metabolic panel  3. Macular degeneration -said to be stable, need copies of ophtho latest notes (last ones in chart are from last year)  4. Rotator cuff tear, left -keep f/u with Dr. Forbes Cellar -probably didn't heal properly due to his smoking  5. Tobacco use disorder -continues, no desire to quit - CBC With differential/Platelet  6. Need for prophylactic vaccination and inoculation against influenza -accepted flu shot since he actually had the flu last year  Labs/tests ordered: Orders Placed This Encounter  Procedures  . CBC With differential/Platelet  . Comprehensive metabolic panel    Next appt:  3 mos for testosterone level   Kerrilyn Azbill L. Tristan Bramble, D.O. [facility] [address], [place] [address] Cell Phone (Mon-Fri 8am-5pm):  [phone] On Call:  [phone] & follow prompts after 5pm &  weekends Office Phone:  [phone] Office Fax:  [phone]

## 2014-03-16 NOTE — Patient Instructions (Signed)
Please return in 1 month for your next testosterone injection and monthly thereafter. I will see you in 3 months for a visit--we can give you your pneumovax pneumonia shot at that time

## 2014-03-17 LAB — COMPREHENSIVE METABOLIC PANEL WITH GFR
ALT: 7 IU/L (ref 0–44)
AST: 14 IU/L (ref 0–40)
Albumin/Globulin Ratio: 1.4 (ref 1.1–2.5)
Albumin: 4.2 g/dL (ref 3.5–4.8)
Alkaline Phosphatase: 52 IU/L (ref 39–117)
BUN/Creatinine Ratio: 12 (ref 10–22)
BUN: 18 mg/dL (ref 8–27)
CO2: 26 mmol/L (ref 18–29)
Calcium: 9 mg/dL (ref 8.6–10.2)
Chloride: 96 mmol/L — ABNORMAL LOW (ref 97–108)
Creatinine, Ser: 1.48 mg/dL — ABNORMAL HIGH (ref 0.76–1.27)
GFR calc Af Amer: 53 mL/min/1.73 — ABNORMAL LOW (ref >59)
GFR calc non Af Amer: 46 mL/min/1.73 — ABNORMAL LOW (ref >59)
Globulin, Total: 3 g/dL (ref 1.5–4.5)
Glucose: 171 mg/dL — ABNORMAL HIGH (ref 65–99)
Potassium: 4.2 mmol/L (ref 3.5–5.2)
Sodium: 138 mmol/L (ref 134–144)
Total Bilirubin: 0.4 mg/dL (ref 0.0–1.2)
Total Protein: 7.2 g/dL (ref 6.0–8.5)

## 2014-03-17 LAB — CBC WITH DIFFERENTIAL
Basophils Absolute: 0.1 x10E3/uL (ref 0.0–0.2)
Basos: 1 %
Eos: 5 %
Eosinophils Absolute: 0.3 x10E3/uL (ref 0.0–0.4)
HCT: 40.7 % (ref 37.5–51.0)
Hemoglobin: 13.8 g/dL (ref 12.6–17.7)
Immature Grans (Abs): 0 x10E3/uL (ref 0.0–0.1)
Immature Granulocytes: 0 %
Lymphocytes Absolute: 2.1 x10E3/uL (ref 0.7–3.1)
Lymphs: 35 %
MCH: 30.5 pg (ref 26.6–33.0)
MCHC: 33.9 g/dL (ref 31.5–35.7)
MCV: 90 fL (ref 79–97)
Monocytes Absolute: 0.6 x10E3/uL (ref 0.1–0.9)
Monocytes: 9 %
Neutrophils Absolute: 3 x10E3/uL (ref 1.4–7.0)
Neutrophils Relative %: 50 %
Platelets: 230 x10E3/uL (ref 150–379)
RBC: 4.53 x10E6/uL (ref 4.14–5.80)
RDW: 14.1 % (ref 12.3–15.4)
WBC: 6 x10E3/uL (ref 3.4–10.8)

## 2014-03-28 ENCOUNTER — Encounter: Payer: Self-pay | Admitting: Internal Medicine

## 2014-03-31 ENCOUNTER — Other Ambulatory Visit: Payer: Self-pay | Admitting: Internal Medicine

## 2014-04-16 ENCOUNTER — Ambulatory Visit: Payer: No Typology Code available for payment source

## 2014-04-17 ENCOUNTER — Ambulatory Visit (INDEPENDENT_AMBULATORY_CARE_PROVIDER_SITE_OTHER): Payer: No Typology Code available for payment source | Admitting: *Deleted

## 2014-04-17 DIAGNOSIS — R7989 Other specified abnormal findings of blood chemistry: Secondary | ICD-10-CM

## 2014-04-17 DIAGNOSIS — E291 Testicular hypofunction: Secondary | ICD-10-CM

## 2014-04-18 MED ORDER — TESTOSTERONE CYPIONATE 100 MG/ML IM SOLN
100.0000 mg | INTRAMUSCULAR | Status: DC
  Administered 2014-04-17: 100 mg via INTRAMUSCULAR

## 2014-04-28 NOTE — Addendum Note (Signed)
Addended by: Hollace Kinnier L on: 04/28/2014 01:15 PM   Modules accepted: Level of Service

## 2014-05-07 ENCOUNTER — Ambulatory Visit: Payer: No Typology Code available for payment source | Admitting: Podiatry

## 2014-05-07 ENCOUNTER — Encounter: Payer: Self-pay | Admitting: Podiatry

## 2014-05-07 ENCOUNTER — Ambulatory Visit (INDEPENDENT_AMBULATORY_CARE_PROVIDER_SITE_OTHER): Payer: No Typology Code available for payment source | Admitting: Podiatry

## 2014-05-07 DIAGNOSIS — B351 Tinea unguium: Secondary | ICD-10-CM

## 2014-05-07 DIAGNOSIS — M79676 Pain in unspecified toe(s): Secondary | ICD-10-CM

## 2014-05-07 NOTE — Progress Notes (Signed)
Patient ID: Scott Willis, male   DOB: 03/20/1941, 74 y.o.   MRN: [id number]  Subjective: This patient presents again complaining of painful toenails when walking wearing shoes  Objective: The toenails are elongated, hypertrophic, brittle, incurvated and tender to palpation 6-10  Assessment: Symptomatic onychomycoses 6-10  Plan: Debrided toenails 10 without a bleeding  Reappoint 3 months

## 2014-05-07 NOTE — Patient Instructions (Signed)
Diabetes and Foot Care Diabetes may cause you to have problems because of poor blood supply (circulation) to your feet and legs. This may cause the skin on your feet to become thinner, break easier, and heal more slowly. Your skin may become dry, and the skin may peel and crack. You may also have nerve damage in your legs and feet causing decreased feeling in them. You may not notice minor injuries to your feet that could lead to infections or more serious problems. Taking care of your feet is one of the most important things you can do for yourself.  HOME CARE INSTRUCTIONS  Wear shoes at all times, even in the house. Do not go barefoot. Bare feet are easily injured.  Check your feet daily for blisters, cuts, and redness. If you cannot see the bottom of your feet, use a mirror or ask someone for help.  Wash your feet with warm water (do not use hot water) and mild soap. Then pat your feet and the areas between your toes until they are completely dry. Do not soak your feet as this can dry your skin.  Apply a moisturizing lotion or petroleum jelly (that does not contain alcohol and is unscented) to the skin on your feet and to dry, brittle toenails. Do not apply lotion between your toes.  Trim your toenails straight across. Do not dig under them or around the cuticle. File the edges of your nails with an emery board or nail file.  Do not cut corns or calluses or try to remove them with medicine.  Wear clean socks or stockings every day. Make sure they are not too tight. Do not wear knee-high stockings since they may decrease blood flow to your legs.  Wear shoes that fit properly and have enough cushioning. To break in new shoes, wear them for just a few hours a day. This prevents you from injuring your feet. Always look in your shoes before you put them on to be sure there are no objects inside.  Do not cross your legs. This may decrease the blood flow to your feet.  If you find a minor scrape,  cut, or break in the skin on your feet, keep it and the skin around it clean and dry. These areas may be cleansed with mild soap and water. Do not cleanse the area with peroxide, alcohol, or iodine.  When you remove an adhesive bandage, be sure not to damage the skin around it.  If you have a wound, look at it several times a day to make sure it is healing.  Do not use heating pads or hot water bottles. They may burn your skin. If you have lost feeling in your feet or legs, you may not know it is happening until it is too late.  Make sure your health care provider performs a complete foot exam at least annually or more often if you have foot problems. Report any cuts, sores, or bruises to your health care provider immediately. SEEK MEDICAL CARE IF:   You have an injury that is not healing.  You have cuts or breaks in the skin.  You have an ingrown nail.  You notice redness on your legs or feet.  You feel burning or tingling in your legs or feet.  You have pain or cramps in your legs and feet.  Your legs or feet are numb.  Your feet always feel cold. SEEK IMMEDIATE MEDICAL CARE IF:   There is increasing redness,   swelling, or pain in or around a wound.  There is a red line that goes up your leg.  Pus is coming from a wound.  You develop a fever or as directed by your health care provider.  You notice a bad smell coming from an ulcer or wound. Document Released: 03/27/2000 Document Revised: 11/30/2012 Document Reviewed: 09/06/2012 [employer] Patient Information 2015 [employer]. This information is not intended to replace advice given to you by your health care provider. Make sure you discuss any questions you have with your health care provider.  

## 2014-05-22 ENCOUNTER — Ambulatory Visit (INDEPENDENT_AMBULATORY_CARE_PROVIDER_SITE_OTHER): Payer: Medicare Other | Admitting: Internal Medicine

## 2014-05-22 DIAGNOSIS — E349 Endocrine disorder, unspecified: Secondary | ICD-10-CM

## 2014-05-22 DIAGNOSIS — E291 Testicular hypofunction: Secondary | ICD-10-CM | POA: Diagnosis not present

## 2014-06-21 ENCOUNTER — Ambulatory Visit (INDEPENDENT_AMBULATORY_CARE_PROVIDER_SITE_OTHER): Payer: Medicare Other | Admitting: Internal Medicine

## 2014-06-21 ENCOUNTER — Encounter: Payer: Self-pay | Admitting: Internal Medicine

## 2014-06-21 ENCOUNTER — Other Ambulatory Visit: Payer: Self-pay | Admitting: *Deleted

## 2014-06-21 VITALS — BP 142/80 | HR 69 | Temp 98.2°F | Resp 16 | Ht 70.0 in | Wt 180.0 lb

## 2014-06-21 DIAGNOSIS — IMO0002 Reserved for concepts with insufficient information to code with codable children: Secondary | ICD-10-CM

## 2014-06-21 DIAGNOSIS — M818 Other osteoporosis without current pathological fracture: Secondary | ICD-10-CM | POA: Diagnosis not present

## 2014-06-21 DIAGNOSIS — E1165 Type 2 diabetes mellitus with hyperglycemia: Secondary | ICD-10-CM

## 2014-06-21 DIAGNOSIS — H3530 Unspecified macular degeneration: Secondary | ICD-10-CM

## 2014-06-21 DIAGNOSIS — T387X5A Adverse effect of androgens and anabolic congeners, initial encounter: Secondary | ICD-10-CM | POA: Diagnosis not present

## 2014-06-21 DIAGNOSIS — E1139 Type 2 diabetes mellitus with other diabetic ophthalmic complication: Secondary | ICD-10-CM | POA: Diagnosis not present

## 2014-06-21 DIAGNOSIS — Z720 Tobacco use: Secondary | ICD-10-CM

## 2014-06-21 DIAGNOSIS — E349 Endocrine disorder, unspecified: Secondary | ICD-10-CM

## 2014-06-21 DIAGNOSIS — F17200 Nicotine dependence, unspecified, uncomplicated: Secondary | ICD-10-CM

## 2014-06-21 DIAGNOSIS — Z23 Encounter for immunization: Secondary | ICD-10-CM | POA: Diagnosis not present

## 2014-06-21 DIAGNOSIS — E291 Testicular hypofunction: Secondary | ICD-10-CM

## 2014-06-21 DIAGNOSIS — N400 Enlarged prostate without lower urinary tract symptoms: Secondary | ICD-10-CM | POA: Diagnosis not present

## 2014-06-21 MED ORDER — TESTOSTERONE CYPIONATE 200 MG/ML IM SOLN
100.0000 mg | INTRAMUSCULAR | Status: DC
  Administered 2014-06-21: 100 mg via INTRAMUSCULAR

## 2014-06-21 MED ORDER — VITAMIN D (ERGOCALCIFEROL) 1.25 MG (50000 UNIT) PO CAPS: ORAL_CAPSULE | ORAL | Status: DC

## 2014-06-21 MED ORDER — TESTOSTERONE CYPIONATE 100 MG/ML IM SOLN
100.0000 mg | INTRAMUSCULAR | Status: DC
  Administered 2014-05-22: 100 mg via INTRAMUSCULAR

## 2014-06-21 NOTE — Patient Instructions (Signed)
Please come monthly for your testosterone injections.

## 2014-06-21 NOTE — Progress Notes (Signed)
Patient ID: Scott Willis, male   DOB: 08-04-1940, 74 y.o.   MRN: [id number]   Location:  [facility]  Code Status: full code  Allergies  Allergen Reactions  . Bee Pollen Anaphylaxis  . Tape     Any medical tape, Rash   . Flurox [Fluorescein-Benoxinate]     Rash   . Metformin And Related     Severe Diarrhea   . Morphine And Related     Kidney failure   . Statins     Loss of appetite    Chief Complaint  Patient presents with  . Medical Management of Chronic Issues    3 month follow-up, not fasting today     HPI: Patient is a 74 y.o. white male seen in the office today for med mgt of chronic diseases.    2-3 days of way out of whack glucose.  Had been 120-130, but charged meter, then 212 at 5pm, then rechecked immediately and 179, 7pm 139.  Did call Dr. Elyse Hsu and he's going to check the meter.    Does not note significant change in symptoms in past few mos with testosterone injections, but says since injections started probably so.    Vitamin D was monthly--insurance company couldn't find it. Needs renewed.    Broke his glasses.  Sees Dr. Zigmund Daniel Monday for his regular visit and is to see Dr. Katy Fitch also for new glasses.    Has to really strain to start urinating.  Prostate had shrunken some per urology.    Has h/o kidney stones and wondered if that was why.    Says mood stable--ok if makes somebody laugh and smile.    Continues to smoke and has no desire to quit.  Back doesn't tell brain it hurts if he's busy joking and kidding.  Left shoulder still hurts--using hydrocodone.  Has gone back to lifting weights.  Has more extension now vs.before.  Dr. Marlou Sa recommended shoulder replacement and he refused.    Dr. Little Ishikawa is taking care of his big toenails. Sees him soon.    Review of Systems:  Review of Systems  Constitutional: Negative for malaise/fatigue.  HENT: Negative for congestion and hearing loss.   Eyes:  Positive for blurred vision.  Respiratory: Negative for shortness of breath.   Cardiovascular: Negative for chest pain.  Gastrointestinal: Negative for abdominal pain, constipation, blood in stool and melena.  Genitourinary: Negative for dysuria and urgency.       Difficulty starting stream  Musculoskeletal: Positive for joint pain. Negative for falls.  Skin: Negative for rash.  Neurological: Negative for dizziness and headaches.  Psychiatric/Behavioral: Positive for depression. Negative for memory loss.    Past Medical History  Diagnosis Date  . Macular degeneration   . Hyperlipidemia LDL goal < 100   . Benign essential hypertension   . Osteoporosis due to androgen therapy   . Low serum testosterone level   . BPH (benign prostatic hyperplasia)   . Blepharitis, squamous     Right Eye   . DM (diabetes mellitus) type II uncontrolled with eye manifestation     fasting 110s  . Kidney stones   . Arthritis   . Blindness   . Ulcer     gastric    Past Surgical History  Procedure Laterality Date  . Back surgery  1998    Dr.Nitka  . Refractive surgery  09/2005    Macular Degeneration Dr.Mathews   . Back surgery  03/2006    Removed Rods Dr.Nitka   . Carpal tunnel release  08/2006  . Cataract extraction  02/21/2007    Dr.Epps   . Kidney stone surgery  08/11/2007    Dr.Tannenbaum   . Carpal tunnel release  08/2006    Dr.Nitka   . Lithotripsy  08/11/2007    Dr.Nesi  . Eye surgery  2013    Right eye every 8 weeks   . Shoulder arthroscopy with subacromial decompression, rotator cuff repair and bicep tendon repair Left 08/22/2013    Procedure:  LEFT SHOULDER DIAGNOSTIC OPERATIVE ARTHROSCOPY ,EXTENSIVE DEBRIDEMENT, BICEPS RELEASE AND TENODESIS, ROTATOR CUFF REPAIR, SUBACROMIAL DECOMPRESSION;  Surgeon: Meredith Pel, MD;  Location: [facility];  Service: Orthopedics;  Laterality: Left;  LEFT SHOULDER DOA,EXTENSIVE DEBRIDEMENT, BICEPS RELEASE AND TENODESIS, ROTATOR CUFF REPAIR,  SUBACROMIAL DECOMPRESSION,    Social History:   reports that he has been smoking Cigarettes.  He has a 88.5 pack-year smoking history. He does not have any smokeless tobacco history on file. He reports that he does not drink alcohol or use illicit drugs.  Family History  Problem Relation Age of Onset  . Colon cancer Father   . Alzheimer's disease Mother   . Diabetes Sister     Medications: Patient's Medications  New Prescriptions   No medications on file  Previous Medications   DIAZEPAM (VALIUM) 5 MG TABLET    Take 5 mg by mouth once. 1 by mouth on the day of eye surgery ONCE every 16 weeks  Dr.[name]   FINASTERIDE (PROSCAR) 5 MG TABLET    Take 5 mg by mouth daily.   GABAPENTIN (NEURONTIN) 600 MG TABLET    Take 600 mg by mouth 3 (three) times daily.   HYDROCODONE-ACETAMINOPHEN (NORCO/VICODIN) 5-325 MG PER TABLET    Take 1 tablet by mouth every 6 (six) hours as needed for moderate pain. Once a month every 10 weeks prior to eye surgery ( 30 minutes).   LIDOCAINE (LIDODERM) 5 %    Place 1 patch onto the skin daily. Remove & Discard patch within 12 hours or as directed by MD   MOXIFLOXACIN (VIGAMOX) 0.5 % OPHTHALMIC SOLUTION    Place 1 drop into the right eye 3 (three) times daily. Every 16 weeks   PIOGLITAZONE (ACTOS) 30 MG TABLET    Take 30 mg by mouth daily. Dr.[name]   POLYETHYL GLYCOL-PROPYL GLYCOL (SYSTANE OP)    Apply to eye as needed.   SITAGLIPTIN (JANUVIA) 100 MG TABLET    Take 50 mg by mouth daily.    TAMSULOSIN (FLOMAX) 0.4 MG CAPS CAPSULE    Take 0.4 mg by mouth daily.   TESTOSTERONE CYPIONATE (DEPO-TESTOSTERONE) 100 MG/ML INJECTION    Inject 1 mL (100 mg total) into the muscle every 28 (twenty-eight) days. For IM use only and Generic is fine   TRIMETHOPRIM-POLYMYXIN B (POLYTRIM) OPHTHALMIC SOLUTION    Place 1 drop into the right eye every 4 (four) hours.   VENLAFAXINE XR (EFFEXOR-XR) 75 MG 24 HR CAPSULE    TAKE 1 CAPSULE (75 MG TOTAL) BY MOUTH DAILY WITH BREAKFAST.    VITAMIN D, ERGOCALCIFEROL, (DRISDOL) 50000 UNITS CAPS CAPSULE    TAKE 1 CAPSULE BY MOUTH EVERY 30 DAYS. FOR OSTEOPOROSIS  Modified Medications   No medications on file  Discontinued Medications   No medications on file     Physical Exam: Filed Vitals:   06/21/14 0835  BP: 142/80  Pulse: 69  Temp: 98.2 F (36.8 C)  TempSrc: Oral  Resp: 16  Height: 5\' 10"  (1.778 m)  Weight: 180 lb (81.647 kg)  SpO2: 99%  Physical Exam  Constitutional: He is oriented to person, place, and time.  Cardiovascular: Normal rate, regular rhythm and normal heart sounds.   Pulmonary/Chest: Effort normal and breath sounds normal.  Abdominal: Soft. Bowel sounds are normal.  Musculoskeletal:  Improved extension of left shoulder, passively can lift fully  Neurological: He is alert and oriented to person, place, and time.  Skin: Skin is warm and dry.    Labs reviewed: Basic Metabolic Panel:  Recent Labs  07/31/13 0946 08/21/13 1005 03/16/14 0941  NA 139 140 138  K 3.7 4.1 4.2  CL 97 100 96*  CO2 25 29 26   GLUCOSE 132* 111* 171*  BUN 18 16 18   CREATININE 1.36* 1.55* 1.48*  CALCIUM 9.4 9.2 9.0   Liver Function Tests:  Recent Labs  07/31/13 0946 03/16/14 0941  AST 15 14  ALT 9 7  ALKPHOS 55 52  BILITOT 0.4 0.4  PROT 7.5 7.2   No results for input(s): LIPASE, AMYLASE in the last 8760 hours. No results for input(s): AMMONIA in the last 8760 hours. CBC:  Recent Labs  07/31/13 0946 08/21/13 1005 03/16/14 0941  WBC 7.5 6.5 6.0  NEUTROABS 4.4  --  3.0  HGB 15.1 13.4 13.8  HCT 44.4 41.5 40.7  MCV 91 94.5 90  PLT 237 188 230   Lipid Panel: No results for input(s): CHOL, HDL, LDLCALC, TRIG, CHOLHDL, LDLDIRECT in the last 8760 hours. Lab Results  Component Value Date   HGBA1C 6.9* 07/31/2013   Assessment/Plan 1. DM (diabetes mellitus) type II uncontrolled with eye manifestation - follows with endocrinology and last hba1c 6.9 -advised to f/u due to meter not working  properly -has not had a recent lipid panel so will check nonfasting b/c he does not come as ordered - Lipid panel  2. Testosterone deficiency - cont monthly injections; has not come for his level as ordered so done before injection today - Testosterone, Free, Total, SHBG  3. Macular degeneration -cont to follow with Dr. Hilaria Ota injections -also has f/u with Dr. Katy Fitch due to his broken glasses  4. Tobacco use disorder -again discussed smoking cessation which he adamantly refuses and is well aware of risks of ongoing tobacco use, but seems to think he is resistant to the possible effects  5. BPH (benign prostatic hyperplasia) -seems to be worsening, is to f/u with urologist, but has not discussed his difficulty starting his stream with them  6. Osteoporosis due to androgen therapy - has been out of vitamin D so will reorder today--importance emphasized - Vitamin D, Ergocalciferol, (DRISDOL) 50000 UNITS CAPS capsule; TAKE 1 CAPSULE BY MOUTH EVERY 30 DAYS. FOR OSTEOPOROSIS  Dispense: 4 capsule; Refill: 0 - Testosterone, Free, Total, SHBG  7. Need for 23-polyvalent pneumococcal polysaccharide vaccine -pneumovax given today  Labs/tests ordered:   Orders Placed This Encounter  Procedures  . Testosterone, Free, Total, SHBG  . Lipid panel    Order Specific Question:  Has the patient fasted?    Answer:  No  . Testosterone, Free, Total, SHBG    Standing Status: Future     Number of Occurrences:      Standing Expiration Date: 06/21/2015  . CBC with Differential/Platelet    Standing Status: Future     Number of Occurrences:      Standing Expiration Date: 06/21/2015    Next appt:  3 mos with labs before; monthly testosterone  injections  Scott Willis, D.O. [facility] [address], [place] [address] Cell Phone (Mon-Fri 8am-5pm):  [phone] On Call:  [phone] & follow prompts after 5pm & weekends Office Phone:   [phone] Office Fax:  [phone]

## 2014-06-21 NOTE — Addendum Note (Signed)
Addended by: Logan Bores on: 06/21/2014 09:52 AM   Modules accepted: Orders

## 2014-06-23 LAB — LIPID PANEL
Chol/HDL Ratio: 3.5 ratio units (ref 0.0–5.0)
Cholesterol, Total: 186 mg/dL (ref 100–199)
HDL: 53 mg/dL (ref >39)
LDL Calculated: 118 mg/dL — ABNORMAL HIGH (ref 0–99)
Triglycerides: 74 mg/dL (ref 0–149)
VLDL Cholesterol Cal: 15 mg/dL (ref 5–40)

## 2014-06-23 LAB — TESTOSTERONE, FREE, TOTAL, SHBG
Sex Hormone Binding: 56.7 nmol/L (ref 19.3–76.4)
Testosterone, Free: 5.5 pg/mL — ABNORMAL LOW (ref 6.6–18.1)
Testosterone: 489 ng/dL (ref 348–1197)

## 2014-06-25 ENCOUNTER — Encounter (INDEPENDENT_AMBULATORY_CARE_PROVIDER_SITE_OTHER): Payer: Medicare Other | Admitting: Ophthalmology

## 2014-06-25 DIAGNOSIS — I10 Essential (primary) hypertension: Secondary | ICD-10-CM

## 2014-06-25 DIAGNOSIS — H35033 Hypertensive retinopathy, bilateral: Secondary | ICD-10-CM

## 2014-06-25 DIAGNOSIS — H43813 Vitreous degeneration, bilateral: Secondary | ICD-10-CM | POA: Diagnosis not present

## 2014-06-25 DIAGNOSIS — H3532 Exudative age-related macular degeneration: Secondary | ICD-10-CM

## 2014-06-25 DIAGNOSIS — H3531 Nonexudative age-related macular degeneration: Secondary | ICD-10-CM

## 2014-07-09 NOTE — Progress Notes (Signed)
Patient ID: Scott Willis, male   DOB: 06-15-1940, 73 y.o.   MRN: [id number] CMA gave testosterone injection today.

## 2014-07-23 ENCOUNTER — Ambulatory Visit (INDEPENDENT_AMBULATORY_CARE_PROVIDER_SITE_OTHER): Payer: Medicare Other | Admitting: *Deleted

## 2014-07-23 ENCOUNTER — Telehealth: Payer: Self-pay | Admitting: *Deleted

## 2014-07-23 DIAGNOSIS — R7989 Other specified abnormal findings of blood chemistry: Secondary | ICD-10-CM

## 2014-07-23 DIAGNOSIS — E291 Testicular hypofunction: Secondary | ICD-10-CM

## 2014-07-23 MED ORDER — TESTOSTERONE CYPIONATE 100 MG/ML IM SOLN
100.0000 mg | INTRAMUSCULAR | Status: DC
  Administered 2014-07-23: 100 mg via INTRAMUSCULAR

## 2014-07-23 NOTE — Telephone Encounter (Signed)
Patient dropped off [employer]abetic [employer] Paperwork requesting Diabetic Shoes. [employer] #: [phone] Fax: [phone]. Paperwork filled out and given to Dr. Mariea Clonts to finish and review and sign.

## 2014-07-31 ENCOUNTER — Telehealth: Payer: Self-pay

## 2014-07-31 NOTE — Telephone Encounter (Signed)
I called patient to inform him that paperwork dropped off will be completed at next appointment in June 2016 per Dr.Reed.  Patient states nothing has changed.  I informed patient that the form has specific questions that require a face-to-face evaluation and documentation of current condition of feet.   Patient mentions he has a pending appointment with the podiatrist on 08/06/14, patient will have note faxed to [place]. Patient would like for Dr.Reed to complete form based on OV note from Podiatrist. Form will be placed in Dr.Reeds folder to await OV note.  Patient is aware that form may or may not be completed based on OV from Podiatrist, it will depend on information contained in note.

## 2014-08-01 NOTE — Telephone Encounter (Signed)
Ok.  I hope the note has the information I need and the little picture of the feet they want completed.

## 2014-08-06 ENCOUNTER — Encounter: Payer: Self-pay | Admitting: Podiatry

## 2014-08-06 ENCOUNTER — Telehealth: Payer: Self-pay | Admitting: *Deleted

## 2014-08-06 ENCOUNTER — Ambulatory Visit (INDEPENDENT_AMBULATORY_CARE_PROVIDER_SITE_OTHER): Payer: Medicare Other | Admitting: Podiatry

## 2014-08-06 DIAGNOSIS — M79676 Pain in unspecified toe(s): Secondary | ICD-10-CM | POA: Diagnosis not present

## 2014-08-06 DIAGNOSIS — B351 Tinea unguium: Secondary | ICD-10-CM | POA: Diagnosis not present

## 2014-08-06 NOTE — Telephone Encounter (Signed)
Patient called very frustrated regarding his Diabetic Shoes. Stated that he has Atrophy in his feet caused by diabetes..."DUH" . Stated that he has checked his my chart and there is nothing even in there about this and states nothing about you ever checking his feet "and that's not right". He stated that Dr. Notes should be alittle more complete than that. Stated that you should already know this since he is a diabetic and why should he have to wait another 3-4 months until he sees you again. Stated all you have to do is fill out the form and if your his PCP you should know that he is diabetic and needs the shoes. He stated that he saw Dr. Amalia Hailey this morning and Dr. Amalia Hailey told him all you have to do is pull up his notes in the patient's chart and find out what you need. Patient stated that obviously Dr. Phoebe Perch notes will be much through than yours. Needs the diabetic shoes and very frustrated. Please Advise.

## 2014-08-06 NOTE — Patient Instructions (Signed)
Diabetes and Foot Care Diabetes may cause you to have problems because of poor blood supply (circulation) to your feet and legs. This may cause the skin on your feet to become thinner, break easier, and heal more slowly. Your skin may become dry, and the skin may peel and crack. You may also have nerve damage in your legs and feet causing decreased feeling in them. You may not notice minor injuries to your feet that could lead to infections or more serious problems. Taking care of your feet is one of the most important things you can do for yourself.  HOME CARE INSTRUCTIONS  Wear shoes at all times, even in the house. Do not go barefoot. Bare feet are easily injured.  Check your feet daily for blisters, cuts, and redness. If you cannot see the bottom of your feet, use a mirror or ask someone for help.  Wash your feet with warm water (do not use hot water) and mild soap. Then pat your feet and the areas between your toes until they are completely dry. Do not soak your feet as this can dry your skin.  Apply a moisturizing lotion or petroleum jelly (that does not contain alcohol and is unscented) to the skin on your feet and to dry, brittle toenails. Do not apply lotion between your toes.  Trim your toenails straight across. Do not dig under them or around the cuticle. File the edges of your nails with an emery board or nail file.  Do not cut corns or calluses or try to remove them with medicine.  Wear clean socks or stockings every day. Make sure they are not too tight. Do not wear knee-high stockings since they may decrease blood flow to your legs.  Wear shoes that fit properly and have enough cushioning. To break in new shoes, wear them for just a few hours a day. This prevents you from injuring your feet. Always look in your shoes before you put them on to be sure there are no objects inside.  Do not cross your legs. This may decrease the blood flow to your feet.  If you find a minor scrape,  cut, or break in the skin on your feet, keep it and the skin around it clean and dry. These areas may be cleansed with mild soap and water. Do not cleanse the area with peroxide, alcohol, or iodine.  When you remove an adhesive bandage, be sure not to damage the skin around it.  If you have a wound, look at it several times a day to make sure it is healing.  Do not use heating pads or hot water bottles. They may burn your skin. If you have lost feeling in your feet or legs, you may not know it is happening until it is too late.  Make sure your health care provider performs a complete foot exam at least annually or more often if you have foot problems. Report any cuts, sores, or bruises to your health care provider immediately. SEEK MEDICAL CARE IF:   You have an injury that is not healing.  You have cuts or breaks in the skin.  You have an ingrown nail.  You notice redness on your legs or feet.  You feel burning or tingling in your legs or feet.  You have pain or cramps in your legs and feet.  Your legs or feet are numb.  Your feet always feel cold. SEEK IMMEDIATE MEDICAL CARE IF:   There is increasing redness,   swelling, or pain in or around a wound.  There is a red line that goes up your leg.  Pus is coming from a wound.  You develop a fever or as directed by your health care provider.  You notice a bad smell coming from an ulcer or wound. Document Released: 03/27/2000 Document Revised: 11/30/2012 Document Reviewed: 09/06/2012 [employer] Patient Information 2015 [employer]. This information is not intended to replace advice given to you by your health care provider. Make sure you discuss any questions you have with your health care provider.  

## 2014-08-06 NOTE — Telephone Encounter (Signed)
If Scott Willis is unsatisfied with his care, he should feel free to locate another primary care physician.  I am not a podiatrist and do not claim to be.  I examined his feet at the visit less than a year ago for his diabetic shoes and completed his paperwork at that time as requested.   It is documented at that time in the encounter where we document this NOT in the notes.  I will copy the office manager and request that she speak with him about these issues.

## 2014-08-07 NOTE — Progress Notes (Signed)
Patient ID: Scott Willis, male   DOB: February 21, 1941, 74 y.o.   MRN: [id number]  Subjective: This patient presents complaining of painful toenails and requests toenail debridement  Objective: The toenails are hypertrophic, elongated, brittle, incurvated and tender to direct palpation 6-10  Assessment: Symptomatic onychomycoses 6-10  Plan: Debridement of toenails 10 without any bleeding  Reappoint 3 months

## 2014-08-21 NOTE — Telephone Encounter (Signed)
OK.  The podiatrist's note was inadequate to complete his form so I could not have done it until his next appointment anyway.

## 2014-08-21 NOTE — Telephone Encounter (Signed)
Patient called and spoke with Inez Catalina and stated that he has found another Primary Provider and will no longer be coming to [place].

## 2014-08-27 ENCOUNTER — Ambulatory Visit: Payer: Medicare Other

## 2014-09-19 ENCOUNTER — Other Ambulatory Visit: Payer: Medicare Other

## 2014-09-24 ENCOUNTER — Ambulatory Visit: Payer: Medicare Other | Admitting: Internal Medicine

## 2014-10-08 ENCOUNTER — Encounter (INDEPENDENT_AMBULATORY_CARE_PROVIDER_SITE_OTHER): Payer: Medicare Other | Admitting: Ophthalmology

## 2014-10-08 ENCOUNTER — Other Ambulatory Visit: Payer: Self-pay

## 2014-10-08 DIAGNOSIS — H35033 Hypertensive retinopathy, bilateral: Secondary | ICD-10-CM

## 2014-10-08 DIAGNOSIS — H43813 Vitreous degeneration, bilateral: Secondary | ICD-10-CM | POA: Diagnosis not present

## 2014-10-08 DIAGNOSIS — I10 Essential (primary) hypertension: Secondary | ICD-10-CM

## 2014-10-08 DIAGNOSIS — H3532 Exudative age-related macular degeneration: Secondary | ICD-10-CM | POA: Diagnosis not present

## 2014-10-08 DIAGNOSIS — H3531 Nonexudative age-related macular degeneration: Secondary | ICD-10-CM

## 2014-11-12 ENCOUNTER — Ambulatory Visit: Payer: Medicare Other | Admitting: Podiatry

## 2014-11-14 ENCOUNTER — Encounter: Payer: Self-pay | Admitting: Podiatry

## 2014-11-14 ENCOUNTER — Ambulatory Visit (INDEPENDENT_AMBULATORY_CARE_PROVIDER_SITE_OTHER): Payer: Medicare Other | Admitting: Podiatry

## 2014-11-14 DIAGNOSIS — M79676 Pain in unspecified toe(s): Secondary | ICD-10-CM | POA: Diagnosis not present

## 2014-11-14 DIAGNOSIS — B351 Tinea unguium: Secondary | ICD-10-CM | POA: Diagnosis not present

## 2014-11-14 NOTE — Patient Instructions (Signed)
Patient states that his primary care physician is going to provide foot care and he request transfer for foot care at this time No follow-up visit made for patient today at patient's request  Diabetes and Foot Care Diabetes may cause you to have problems because of poor blood supply (circulation) to your feet and legs. This may cause the skin on your feet to become thinner, break easier, and heal more slowly. Your skin may become dry, and the skin may peel and crack. You may also have nerve damage in your legs and feet causing decreased feeling in them. You may not notice minor injuries to your feet that could lead to infections or more serious problems. Taking care of your feet is one of the most important things you can do for yourself.  HOME CARE INSTRUCTIONS  Wear shoes at all times, even in the house. Do not go barefoot. Bare feet are easily injured.  Check your feet daily for blisters, cuts, and redness. If you cannot see the bottom of your feet, use a mirror or ask someone for help.  Wash your feet with warm water (do not use hot water) and mild soap. Then pat your feet and the areas between your toes until they are completely dry. Do not soak your feet as this can dry your skin.  Apply a moisturizing lotion or petroleum jelly (that does not contain alcohol and is unscented) to the skin on your feet and to dry, brittle toenails. Do not apply lotion between your toes.  Trim your toenails straight across. Do not dig under them or around the cuticle. File the edges of your nails with an emery board or nail file.  Do not cut corns or calluses or try to remove them with medicine.  Wear clean socks or stockings every day. Make sure they are not too tight. Do not wear knee-high stockings since they may decrease blood flow to your legs.  Wear shoes that fit properly and have enough cushioning. To break in new shoes, wear them for just a few hours a day. This prevents you from injuring your feet.  Always look in your shoes before you put them on to be sure there are no objects inside.  Do not cross your legs. This may decrease the blood flow to your feet.  If you find a minor scrape, cut, or break in the skin on your feet, keep it and the skin around it clean and dry. These areas may be cleansed with mild soap and water. Do not cleanse the area with peroxide, alcohol, or iodine.  When you remove an adhesive bandage, be sure not to damage the skin around it.  If you have a wound, look at it several times a day to make sure it is healing.  Do not use heating pads or hot water bottles. They may burn your skin. If you have lost feeling in your feet or legs, you may not know it is happening until it is too late.  Make sure your health care provider performs a complete foot exam at least annually or more often if you have foot problems. Report any cuts, sores, or bruises to your health care provider immediately. SEEK MEDICAL CARE IF:   You have an injury that is not healing.  You have cuts or breaks in the skin.  You have an ingrown nail.  You notice redness on your legs or feet.  You feel burning or tingling in your legs or feet.  You have pain or cramps in your legs and feet.  Your legs or feet are numb.  Your feet always feel cold. SEEK IMMEDIATE MEDICAL CARE IF:   There is increasing redness, swelling, or pain in or around a wound.  There is a red line that goes up your leg.  Pus is coming from a wound.  You develop a fever or as directed by your health care provider.  You notice a bad smell coming from an ulcer or wound. Document Released: 03/27/2000 Document Revised: 11/30/2012 Document Reviewed: 09/06/2012 [facility] Patient Information 2015 [place], [place]. This information is not intended to replace advice given to you by your health care provider. Make sure you discuss any questions you have with your health care provider.

## 2014-11-15 NOTE — Progress Notes (Signed)
Patient ID: Scott Willis, male   DOB: 07/01/1940, 74 y.o.   MRN: [id number]  Subjective: This patient presents for a scheduled visit complaining of painful toenails and requests toenail debridement. He also states that he is change his primary care physician who is willing to debridement is painful toenails and stated that he would not present to our office for any further nail debridement after this visit.  Objective: The toenails are hypertrophic, brittle, incurvated, discolored and tender direct palpation 6-10  Assessment: Symptomatic onychomycoses 6-10 Type II diabetic  Plan: Debridement of toenails 10 and mechanically and electively without any bleeding  Patient will contact his primary care physician for follow-up care including foot care and will contact us if he wishes any further treatment or evaluation

## 2015-01-28 ENCOUNTER — Encounter (INDEPENDENT_AMBULATORY_CARE_PROVIDER_SITE_OTHER): Payer: Medicare Other | Admitting: Ophthalmology

## 2015-01-28 DIAGNOSIS — I10 Essential (primary) hypertension: Secondary | ICD-10-CM

## 2015-01-28 DIAGNOSIS — H43813 Vitreous degeneration, bilateral: Secondary | ICD-10-CM

## 2015-01-28 DIAGNOSIS — H353231 Exudative age-related macular degeneration, bilateral, with active choroidal neovascularization: Secondary | ICD-10-CM | POA: Diagnosis not present

## 2015-01-28 DIAGNOSIS — H35033 Hypertensive retinopathy, bilateral: Secondary | ICD-10-CM | POA: Diagnosis not present

## 2015-04-23 ENCOUNTER — Observation Stay (HOSPITAL_COMMUNITY): Payer: Medicare Other

## 2015-04-23 ENCOUNTER — Observation Stay (HOSPITAL_COMMUNITY)
Admission: EM | Admit: 2015-04-23 | Discharge: 2015-04-24 | Disposition: A | Discharge status: Home / Self Care | Payer: Medicare Other | Attending: Internal Medicine | Admitting: Internal Medicine

## 2015-04-23 ENCOUNTER — Emergency Department (HOSPITAL_COMMUNITY): Payer: Medicare Other

## 2015-04-23 ENCOUNTER — Encounter (HOSPITAL_COMMUNITY): Payer: Self-pay | Admitting: Emergency Medicine

## 2015-04-23 DIAGNOSIS — H3530 Unspecified macular degeneration: Secondary | ICD-10-CM | POA: Diagnosis not present

## 2015-04-23 DIAGNOSIS — E785 Hyperlipidemia, unspecified: Secondary | ICD-10-CM | POA: Insufficient documentation

## 2015-04-23 DIAGNOSIS — E11311 Type 2 diabetes mellitus with unspecified diabetic retinopathy with macular edema: Secondary | ICD-10-CM

## 2015-04-23 DIAGNOSIS — E1142 Type 2 diabetes mellitus with diabetic polyneuropathy: Secondary | ICD-10-CM | POA: Diagnosis not present

## 2015-04-23 DIAGNOSIS — E11319 Type 2 diabetes mellitus with unspecified diabetic retinopathy without macular edema: Secondary | ICD-10-CM | POA: Insufficient documentation

## 2015-04-23 DIAGNOSIS — R55 Syncope and collapse: Secondary | ICD-10-CM | POA: Diagnosis not present

## 2015-04-23 DIAGNOSIS — F17210 Nicotine dependence, cigarettes, uncomplicated: Secondary | ICD-10-CM | POA: Diagnosis not present

## 2015-04-23 DIAGNOSIS — H5442 Blindness, left eye, normal vision right eye: Secondary | ICD-10-CM | POA: Diagnosis not present

## 2015-04-23 DIAGNOSIS — I10 Essential (primary) hypertension: Secondary | ICD-10-CM | POA: Insufficient documentation

## 2015-04-23 DIAGNOSIS — E1165 Type 2 diabetes mellitus with hyperglycemia: Secondary | ICD-10-CM

## 2015-04-23 DIAGNOSIS — Z794 Long term (current) use of insulin: Secondary | ICD-10-CM

## 2015-04-23 LAB — HEPATIC FUNCTION PANEL
ALT: 13 U/L — ABNORMAL LOW (ref 17–63)
AST: 18 U/L (ref 15–41)
Albumin: 3.6 g/dL (ref 3.5–5.0)
Alkaline Phosphatase: 46 U/L (ref 38–126)
Bilirubin, Direct: 0.2 mg/dL (ref 0.1–0.5)
Indirect Bilirubin: 0.5 mg/dL (ref 0.3–0.9)
Total Bilirubin: 0.7 mg/dL (ref 0.3–1.2)
Total Protein: 7 g/dL (ref 6.5–8.1)

## 2015-04-23 LAB — CBC
HCT: 41.1 % (ref 39.0–52.0)
HCT: 42.5 % (ref 39.0–52.0)
Hemoglobin: 13.3 g/dL (ref 13.0–17.0)
Hemoglobin: 13.9 g/dL (ref 13.0–17.0)
MCH: 30.6 pg (ref 26.0–34.0)
MCH: 31 pg (ref 26.0–34.0)
MCHC: 32.4 g/dL (ref 30.0–36.0)
MCHC: 32.7 g/dL (ref 30.0–36.0)
MCV: 94.7 fL (ref 78.0–100.0)
MCV: 94.7 fL (ref 78.0–100.0)
Platelets: 156 10*3/uL (ref 150–400)
Platelets: 170 10*3/uL (ref 150–400)
RBC: 4.34 MIL/uL (ref 4.22–5.81)
RBC: 4.49 MIL/uL (ref 4.22–5.81)
RDW: 13.4 % (ref 11.5–15.5)
RDW: 13.4 % (ref 11.5–15.5)
WBC: 7.3 10*3/uL (ref 4.0–10.5)
WBC: 7.4 10*3/uL (ref 4.0–10.5)

## 2015-04-23 LAB — CREATININE, SERUM
Creatinine, Ser: 1.27 mg/dL — ABNORMAL HIGH (ref 0.61–1.24)
GFR calc Af Amer: >60 mL/min (ref >60)
GFR calc non Af Amer: 54 mL/min — ABNORMAL LOW (ref >60)

## 2015-04-23 LAB — URINALYSIS, ROUTINE W REFLEX MICROSCOPIC
Bilirubin Urine: NEGATIVE
Glucose, UA: NEGATIVE mg/dL
Hgb urine dipstick: NEGATIVE
Ketones, ur: NEGATIVE mg/dL
Leukocytes, UA: NEGATIVE
Nitrite: NEGATIVE
Protein, ur: NEGATIVE mg/dL
Specific Gravity, Urine: 1.01 (ref 1.005–1.030)
pH: 7 (ref 5.0–8.0)

## 2015-04-23 LAB — I-STAT TROPONIN, ED: Troponin i, poc: 0.01 ng/mL (ref 0.00–0.08)

## 2015-04-23 LAB — TSH: TSH: 1.258 u[IU]/mL (ref 0.350–4.500)

## 2015-04-23 LAB — BASIC METABOLIC PANEL
Anion gap: 7 (ref 5–15)
BUN: 17 mg/dL (ref 6–20)
CO2: 28 mmol/L (ref 22–32)
Calcium: 8.9 mg/dL (ref 8.9–10.3)
Chloride: 104 mmol/L (ref 101–111)
Creatinine, Ser: 1.42 mg/dL — ABNORMAL HIGH (ref 0.61–1.24)
GFR calc Af Amer: 55 mL/min — ABNORMAL LOW (ref >60)
GFR calc non Af Amer: 47 mL/min — ABNORMAL LOW (ref >60)
Glucose, Bld: 211 mg/dL — ABNORMAL HIGH (ref 65–99)
Potassium: 4.3 mmol/L (ref 3.5–5.1)
Sodium: 139 mmol/L (ref 135–145)

## 2015-04-23 LAB — GLUCOSE, CAPILLARY: Glucose-Capillary: 133 mg/dL — ABNORMAL HIGH (ref 65–99)

## 2015-04-23 LAB — MAGNESIUM: Magnesium: 2.1 mg/dL (ref 1.7–2.4)

## 2015-04-23 LAB — CBG MONITORING, ED: Glucose-Capillary: 197 mg/dL — ABNORMAL HIGH (ref 65–99)

## 2015-04-23 MED ORDER — ACETAMINOPHEN 325 MG PO TABS: 650.0000 mg | ORAL_TABLET | Freq: Four times a day (QID) | ORAL | Status: DC | PRN

## 2015-04-23 MED ORDER — INSULIN ASPART 100 UNIT/ML ~~LOC~~ SOLN
0.0000 [IU] | Freq: Three times a day (TID) | SUBCUTANEOUS | Status: DC
  Administered 2015-04-24: 1 [IU] via SUBCUTANEOUS

## 2015-04-23 MED ORDER — GABAPENTIN 600 MG PO TABS
300.0000 mg | ORAL_TABLET | Freq: Three times a day (TID) | ORAL | Status: DC
  Administered 2015-04-23 – 2015-04-24 (×2): 300 mg via ORAL
  Filled 2015-04-23 (×2): qty 1

## 2015-04-23 MED ORDER — AMLODIPINE BESYLATE 10 MG PO TABS
10.0000 mg | ORAL_TABLET | Freq: Every day | ORAL | Status: DC
  Administered 2015-04-24: 10 mg via ORAL
  Filled 2015-04-23: qty 2
  Filled 2015-04-23: qty 1

## 2015-04-23 MED ORDER — SODIUM CHLORIDE 0.9 % IV SOLN
INTRAVENOUS | Status: DC
  Administered 2015-04-23 – 2015-04-24 (×2): via INTRAVENOUS

## 2015-04-23 MED ORDER — FINASTERIDE 5 MG PO TABS
5.0000 mg | ORAL_TABLET | Freq: Every day | ORAL | Status: DC
  Administered 2015-04-23: 5 mg via ORAL
  Filled 2015-04-23: qty 1

## 2015-04-23 MED ORDER — SODIUM CHLORIDE 0.9 % IJ SOLN
3.0000 mL | Freq: Two times a day (BID) | INTRAMUSCULAR | Status: DC
  Administered 2015-04-24: 3 mL via INTRAVENOUS

## 2015-04-23 MED ORDER — FAMOTIDINE 20 MG PO TABS
20.0000 mg | ORAL_TABLET | Freq: Two times a day (BID) | ORAL | Status: DC | PRN
  Administered 2015-04-23: 20 mg via ORAL
  Filled 2015-04-23: qty 1

## 2015-04-23 MED ORDER — PIOGLITAZONE HCL 30 MG PO TABS
30.0000 mg | ORAL_TABLET | Freq: Every day | ORAL | Status: DC
  Administered 2015-04-23: 30 mg via ORAL
  Filled 2015-04-23 (×2): qty 1

## 2015-04-23 MED ORDER — ENOXAPARIN SODIUM 40 MG/0.4ML ~~LOC~~ SOLN
40.0000 mg | SUBCUTANEOUS | Status: DC
  Administered 2015-04-23: 40 mg via SUBCUTANEOUS
  Filled 2015-04-23: qty 0.4

## 2015-04-23 MED ORDER — ACETAMINOPHEN 650 MG RE SUPP: 650.0000 mg | Freq: Four times a day (QID) | RECTAL | Status: DC | PRN

## 2015-04-23 MED ORDER — HYDRALAZINE HCL 20 MG/ML IJ SOLN: 5.0000 mg | INTRAMUSCULAR | Status: DC | PRN

## 2015-04-23 MED ORDER — TAMSULOSIN HCL 0.4 MG PO CAPS
0.4000 mg | ORAL_CAPSULE | Freq: Every day | ORAL | Status: DC
  Administered 2015-04-23: 0.4 mg via ORAL
  Filled 2015-04-23: qty 1

## 2015-04-23 MED ORDER — GATIFLOXACIN 0.5 % OP SOLN: 1.0000 [drp] | Freq: Four times a day (QID) | OPHTHALMIC | Status: DC

## 2015-04-23 MED ORDER — LINAGLIPTIN 5 MG PO TABS
5.0000 mg | ORAL_TABLET | Freq: Every day | ORAL | Status: DC
  Administered 2015-04-24: 5 mg via ORAL
  Filled 2015-04-23: qty 1

## 2015-04-23 MED ORDER — ONDANSETRON HCL 4 MG/2ML IJ SOLN
4.0000 mg | Freq: Four times a day (QID) | INTRAMUSCULAR | Status: DC | PRN
  Administered 2015-04-23: 4 mg via INTRAVENOUS
  Filled 2015-04-23: qty 2

## 2015-04-23 MED ORDER — SODIUM CHLORIDE 0.9 % IV BOLUS (SEPSIS)
500.0000 mL | Freq: Once | INTRAVENOUS | Status: AC
  Administered 2015-04-23: 500 mL via INTRAVENOUS

## 2015-04-23 MED ORDER — ONDANSETRON HCL 4 MG PO TABS: 4.0000 mg | ORAL_TABLET | Freq: Four times a day (QID) | ORAL | Status: DC | PRN

## 2015-04-23 MED ORDER — LEVALBUTEROL HCL 0.63 MG/3ML IN NEBU: 0.6300 mg | INHALATION_SOLUTION | Freq: Four times a day (QID) | RESPIRATORY_TRACT | Status: DC | PRN

## 2015-04-23 NOTE — ED Notes (Signed)
Patient transported to CT 

## 2015-04-23 NOTE — ED Provider Notes (Signed)
CSN: [id number]     Arrival date & time 04/23/15  1149 History   First MD Initiated Contact with Patient 04/23/15 1157     Chief Complaint  Patient presents with  . Loss of Consciousness     (Consider location/radiation/quality/duration/timing/severity/associated sxs/prior Treatment) HPI Comments: Patient presents after syncopal episode. He has a history of diabetes, hyperlipidemia.  He states that he's been well prior to today. 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 He states he started getting very nauseated and became per firstly diaphoretic. He states he became lightheaded as well. When he stood up he passed out. He denies any palpitations. No chest pain. No shortness of breath. No recent illnesses. No recent coughs or colds. No past history of known arrhythmias or heart disease. His last nuclear stress test was about 2-3 years ago per his report. He is in everyday smoker. He's had 3 episodes of vomiting since this episode. He still feels a little bit nauseated. He was given 1 dose of Zofran prior to arrival by EMS. He was not found to be hypotensive by EMS. Although his BP was lower than normal at 0000000 systolic.  Patient is a 75 y.o. male presenting with syncope.  Loss of Consciousness Associated symptoms: nausea and vomiting   Associated symptoms: no chest pain, no diaphoresis, no dizziness, no fever, no headaches, no shortness of breath and no weakness     Past Medical History  Diagnosis Date  . Macular degeneration   . Hyperlipidemia LDL goal < 100   . Benign essential hypertension   . Osteoporosis due to androgen therapy   . Low serum testosterone level   . BPH (benign prostatic hyperplasia)   . Blepharitis, squamous     Right Eye   . DM (diabetes mellitus) type II uncontrolled with eye manifestation (HCC)     fasting 110s  . Kidney stones   . Arthritis   . Blindness   . Ulcer     gastric   Past Surgical History  Procedure Laterality Date  . Back  surgery  1998    Dr.Nitka  . Refractive surgery  09/2005    Macular Degeneration Dr.Mathews   . Back surgery  03/2006    Removed Rods Dr.Nitka   . Carpal tunnel release  08/2006  . Cataract extraction  02/21/2007    Dr.Epps   . Kidney stone surgery  08/11/2007    Dr.Tannenbaum   . Carpal tunnel release  08/2006    Dr.Nitka   . Lithotripsy  08/11/2007    Dr.Nesi  . Eye surgery  2013    Right eye every 8 weeks   . Shoulder arthroscopy with subacromial decompression, rotator cuff repair and bicep tendon repair Left 08/22/2013    Procedure:  LEFT SHOULDER DIAGNOSTIC OPERATIVE ARTHROSCOPY ,EXTENSIVE DEBRIDEMENT, BICEPS RELEASE AND TENODESIS, ROTATOR CUFF REPAIR, SUBACROMIAL DECOMPRESSION;  Surgeon: Meredith Pel, MD;  Location: [place];  Service: Orthopedics;  Laterality: Left;  LEFT SHOULDER DOA,EXTENSIVE DEBRIDEMENT, BICEPS RELEASE AND TENODESIS, ROTATOR CUFF REPAIR, SUBACROMIAL DECOMPRESSION,   Family History  Problem Relation Age of Onset  . Colon cancer Father   . Alzheimer's disease Mother   . Diabetes Sister    Social History  Substance Use Topics  . Smoking status: Current Every Day Smoker -- 1.50 packs/day for 59 years    Types: Cigarettes  . Smokeless tobacco: Never Used  . Alcohol Use: No    Review of Systems  Constitutional: Negative for fever,  chills, diaphoresis and fatigue.  HENT: Negative for congestion, rhinorrhea and sneezing.   Eyes: Negative.   Respiratory: Negative for cough, chest tightness and shortness of breath.   Cardiovascular: Positive for syncope. Negative for chest pain and leg swelling.  Gastrointestinal: Positive for nausea and vomiting. Negative for abdominal pain, diarrhea and blood in stool.  Genitourinary: Negative for frequency, hematuria, flank pain and difficulty urinating.  Musculoskeletal: Negative for back pain and arthralgias.  Skin: Negative for rash.  Neurological: Positive for syncope. Negative for dizziness, speech difficulty,  weakness, numbness and headaches.      Allergies  Bee pollen; Tape; Flurox; Metformin and related; Morphine and related; and Statins  Home Medications   Prior to Admission medications   Medication Sig Start Date End Date Taking? Authorizing Provider  diazepam (VALIUM) 5 MG tablet Take 5 mg by mouth See admin instructions. 1 by mouth on the day of eye surgery ONCE every 16 weeks  Dr.[name]   Yes Historical Provider, MD  finasteride (PROSCAR) 5 MG tablet Take 5 mg by mouth at bedtime.    Yes Historical Provider, MD  gabapentin (NEURONTIN) 600 MG tablet Take 600 mg by mouth 3 (three) times daily.   Yes Historical Provider, MD  HYDROcodone-acetaminophen (NORCO/VICODIN) 5-325 MG per tablet Take 1 tablet by mouth See admin instructions. Once every 16 weeks before eyes injections   Yes Historical Provider, MD  lidocaine (LIDODERM) 5 % Place 1 patch onto the skin daily as needed (PAIN). Remove & Discard patch within 12 hours or as directed by MD   Yes Historical Provider, MD  moxifloxacin (VIGAMOX) 0.5 % ophthalmic solution Place 1 drop into the right eye 3 (three) times daily. Every 16 weeks for 4 days after surgery   Yes Historical Provider, MD  pioglitazone (ACTOS) 30 MG tablet Take 30 mg by mouth at bedtime. Dr.[name]   Yes Historical Provider, MD  Polyethyl Glycol-Propyl Glycol (SYSTANE OP) Apply 1 drop to eye as needed (dry eyes).    Yes Historical Provider, MD  sitaGLIPtin (JANUVIA) 100 MG tablet Take 100 mg by mouth daily.    Yes Historical Provider, MD  tamsulosin (FLOMAX) 0.4 MG CAPS capsule Take 0.4 mg by mouth daily after supper.    Yes Historical Provider, MD  testosterone cypionate (DEPO-TESTOSTERONE) 100 MG/ML injection Inject 1 mL (100 mg total) into the muscle every 28 (twenty-eight) days. For IM use only and Generic is fine 11/22/13  Yes Tiffany L Reed, DO  venlafaxine XR (EFFEXOR-XR) 75 MG 24 hr capsule TAKE 1 CAPSULE (75 MG TOTAL) BY MOUTH DAILY WITH BREAKFAST. Patient taking  differently: TAKE 1 CAPSULE (75 MG TOTAL) BY MOUTH DAILY WITH SUPPER 04/02/14  Yes Tiffany L Reed, DO  Vitamin D, Ergocalciferol, (DRISDOL) 50000 UNITS CAPS capsule TAKE 1 CAPSULE BY MOUTH EVERY 30 DAYS. FOR OSTEOPOROSIS Patient not taking: Reported on 04/23/2015 06/21/14   Tiffany L Reed, DO   BP 183/76 mmHg  Pulse 56  Temp(Src) 97.9 F (36.6 C) (Oral)  Resp 20  Ht 5' 10.5" (1.791 m)  Wt 173 lb (78.472 kg)  BMI 24.46 kg/m2  SpO2 99% Physical Exam  Constitutional: He is oriented to person, place, and time. He appears well-developed and well-nourished.  HENT:  Head: Normocephalic and atraumatic.  Eyes: Pupils are equal, round, and reactive to light.  Neck: Normal range of motion. Neck supple.  Cardiovascular: Normal rate, regular rhythm and normal heart sounds.   Pulmonary/Chest: Effort normal and breath sounds normal. No respiratory distress. He has no  wheezes. He has no rales. He exhibits no tenderness.  Abdominal: Soft. Bowel sounds are normal. There is no tenderness. There is no rebound and no guarding.  Musculoskeletal: Normal range of motion. He exhibits no edema.  Lymphadenopathy:    He has no cervical adenopathy.  Neurological: He is alert and oriented to person, place, and time. He has normal strength. No cranial nerve deficit or sensory deficit. GCS eye subscore is 4. GCS verbal subscore is 5. GCS motor subscore is 6.  Finger-nose intact, no pronator drift  Skin: Skin is warm and dry. No rash noted.  Psychiatric: He has a normal mood and affect.    ED Course  Procedures (including critical care time) Labs Review Labs Reviewed  BASIC METABOLIC PANEL - Abnormal; Notable for the following:    Glucose, Bld 211 (*)    Creatinine, Ser 1.42 (*)    GFR calc non Af Amer 47 (*)    GFR calc Af Amer 55 (*)    All other components within normal limits  CBG MONITORING, ED - Abnormal; Notable for the following:    Glucose-Capillary 197 (*)    All other components within normal  limits  CBC  URINALYSIS, ROUTINE W REFLEX MICROSCOPIC (NOT AT [facility] [address])  Randolm Idol, ED    Imaging Review Dg Chest 2 View  04/23/2015  CLINICAL DATA:  Syncope EXAM: CHEST  2 VIEW COMPARISON:  04/19/2013 FINDINGS: Coarsened markings at the bases in the frontal projection not visualized laterally. Chronic hyperinflation. There is no edema, consolidation, effusion, or pneumothorax. Normal heart size and aortic contours. Remote lower thoracic compression fracture with stable moderate height loss compared to 2015 study. Chronic and stable symmetric biapical pleural scarring. IMPRESSION: No evidence of acute disease. Electronically Signed   By: Monte Fantasia M.D.   On: 04/23/2015 13:53   I have personally reviewed and evaluated these images and lab results as part of my medical decision-making.   EKG Interpretation   Date/Time:  Tuesday April 23 2015 11:58:57 EST Ventricular Rate:  70 PR Interval:  205 QRS Duration: 91 QT Interval:  440 QTC Calculation: 475 R Axis:   78 Text Interpretation:  Sinus rhythm ?ST elevation in V3 only Confirmed by  Kawan Valladolid  MD, [place] ([id number]) on 04/23/2015 12:15:03 PM      MDM   Final diagnoses:  Syncope, unspecified syncope type    Patient presents after an episode of syncope. He had a bit of a concerning story for possible arrhythmia as he got diaphoretic and nauseated just sitting in his chair. He is asymptomatic now. He has no neurologic deficits. He did not have any associated chest pain. There are no ischemic changes on EKG. His troponin is negative. I consulted with Dr. Allyson Sabal with the hospitalist service to admit the patient to telemetry bed, observation.    Malvin Johns, MD 04/23/15 [phone]

## 2015-04-23 NOTE — H&P (Addendum)
[facility] History and Physical  FRANCO EDMUNDSON [id number] DOB: [birth year] DOA: 04/23/2015  Referring physician: *   PCP: Myriam Jacobson, MD   Chief Complaint: Syncope   HPI:  75 year old male with history of diabetes with peripheral neuropathy, macular degeneration, benign essential hypertension, followed by Dr. [name] for his cardiac issues at least twice a year who presents to the ER, After the syncopal episode. The patient woke up as usual at 5 AM, did not feel so well, had some nausea, some weakness but no focal symptoms of fever, chest pain, palpitations, lightheadedness, vertigo. Patient states that he just started taking Januvia and Zanaflex yesterday after coming out of the donut hole when he could not afford both of these medications for 3 months. No other changes in his medication. Otherwise he has been in his usual state of health. Patient states that he was probably out for at least 20 minutes. Following his syncopal episode. Patient called 911. He had 3 episodes of vomiting enroute  to the ED via EMS as well as in the ER. Nausea was relieved by Zofran. Patient states that he had a nuclear stress test about 3 years ago and it was within normal limits. Otherwise he states that he is very active and reasonably good health. Blood pressure in the ER as elevated at 99991111 systolic.  Telemetry shows PVCs, cr 1.42, CBG 197.      Review of Systems: negative for the following  Constitutional: Denies fever, chills, diaphoresis, appetite change and fatigue.  HEENT: Denies photophobia, eye pain, redness, hearing loss, ear pain, congestion, sore throat, rhinorrhea, sneezing, mouth sores, trouble swallowing, neck pain, neck stiffness and tinnitus.  Respiratory: Denies SOB, DOE, cough, chest tightness, and wheezing.  Cardiovascular: Denies chest pain, palpitations and leg swelling.  Gastrointestinal: : nausea and vomiting , abdominal pain, diarrhea, constipation, blood in  stool and abdominal distention.  Genitourinary: Denies dysuria, urgency, frequency, hematuria, flank pain and difficulty urinating.  Musculoskeletal: Denies myalgias, back pain, joint swelling, arthralgias and gait problem.  Skin: Denies pallor, rash and wound.  Neurological: Denies dizziness, seizures, positive for syncope, weakness, light-headedness, numbness and headaches.  Hematological: Denies adenopathy. Easy bruising, personal or family bleeding history  Psychiatric/Behavioral: Denies suicidal ideation, mood changes, confusion, nervousness, sleep disturbance and agitation       Past Medical History  Diagnosis Date  . Macular degeneration   . Hyperlipidemia LDL goal < 100   . Benign essential hypertension   . Osteoporosis due to androgen therapy   . Low serum testosterone level   . BPH (benign prostatic hyperplasia)   . Blepharitis, squamous     Right Eye   . DM (diabetes mellitus) type II uncontrolled with eye manifestation (HCC)     fasting 110s  . Kidney stones   . Arthritis   . Blindness   . Ulcer     gastric     Past Surgical History  Procedure Laterality Date  . Back surgery  1998    Dr.Nitka  . Refractive surgery  09/2005    Macular Degeneration Dr.Mathews   . Back surgery  03/2006    Removed Rods Dr.Nitka   . Carpal tunnel release  08/2006  . Cataract extraction  02/21/2007    Dr.Epps   . Kidney stone surgery  08/11/2007    Dr.Tannenbaum   . Carpal tunnel release  08/2006    Dr.Nitka   . Lithotripsy  08/11/2007    Dr.Nesi  . Eye surgery  2013  Right eye every 8 weeks   . Shoulder arthroscopy with subacromial decompression, rotator cuff repair and bicep tendon repair Left 08/22/2013    Procedure:  LEFT SHOULDER DIAGNOSTIC OPERATIVE ARTHROSCOPY ,EXTENSIVE DEBRIDEMENT, BICEPS RELEASE AND TENODESIS, ROTATOR CUFF REPAIR, SUBACROMIAL DECOMPRESSION;  Surgeon: Meredith Pel, MD;  Location: [facility];  Service: Orthopedics;  Laterality: Left;  LEFT SHOULDER  DOA,EXTENSIVE DEBRIDEMENT, BICEPS RELEASE AND TENODESIS, ROTATOR CUFF REPAIR, SUBACROMIAL DECOMPRESSION,      Social History:  reports that he has been smoking Cigarettes.  He has a 88.5 pack-year smoking history. He has never used smokeless tobacco. He reports that he does not drink alcohol or use illicit drugs.    Allergies  Allergen Reactions  . Bee Pollen Anaphylaxis  . Tape     Any medical tape, Rash   . Flurox [Fluorescein-Benoxinate]     Rash   . Metformin And Related     Severe Diarrhea   . Morphine And Related     Kidney failure   . Statins     Loss of appetite    Family History  Problem Relation Age of Onset  . Colon cancer Father   . Alzheimer's disease Mother   . Diabetes Sister        Prior to Admission medications   Medication Sig Start Date End Date Taking? Authorizing Provider  diazepam (VALIUM) 5 MG tablet Take 5 mg by mouth See admin instructions. 1 by mouth on the day of eye surgery ONCE every 16 weeks  Dr.[name]   Yes Historical Provider, MD  finasteride (PROSCAR) 5 MG tablet Take 5 mg by mouth at bedtime.    Yes Historical Provider, MD  gabapentin (NEURONTIN) 600 MG tablet Take 600 mg by mouth 3 (three) times daily.   Yes Historical Provider, MD  HYDROcodone-acetaminophen (NORCO/VICODIN) 5-325 MG per tablet Take 1 tablet by mouth See admin instructions. Once every 16 weeks before eyes injections   Yes Historical Provider, MD  lidocaine (LIDODERM) 5 % Place 1 patch onto the skin daily as needed (PAIN). Remove & Discard patch within 12 hours or as directed by MD   Yes Historical Provider, MD  moxifloxacin (VIGAMOX) 0.5 % ophthalmic solution Place 1 drop into the right eye 3 (three) times daily. Every 16 weeks for 4 days after surgery   Yes Historical Provider, MD  pioglitazone (ACTOS) 30 MG tablet Take 30 mg by mouth at bedtime. Dr.[name]   Yes Historical Provider, MD  Polyethyl Glycol-Propyl Glycol (SYSTANE OP) Apply 1 drop to eye as needed (dry eyes).     Yes Historical Provider, MD  sitaGLIPtin (JANUVIA) 100 MG tablet Take 100 mg by mouth daily.    Yes Historical Provider, MD  tamsulosin (FLOMAX) 0.4 MG CAPS capsule Take 0.4 mg by mouth daily after supper.    Yes Historical Provider, MD  testosterone cypionate (DEPO-TESTOSTERONE) 100 MG/ML injection Inject 1 mL (100 mg total) into the muscle every 28 (twenty-eight) days. For IM use only and Generic is fine 11/22/13  Yes Tiffany L Reed, DO  venlafaxine XR (EFFEXOR-XR) 75 MG 24 hr capsule TAKE 1 CAPSULE (75 MG TOTAL) BY MOUTH DAILY WITH BREAKFAST. Patient taking differently: TAKE 1 CAPSULE (75 MG TOTAL) BY MOUTH DAILY WITH SUPPER 04/02/14  Yes Tiffany L Reed, DO  Vitamin D, Ergocalciferol, (DRISDOL) 50000 UNITS CAPS capsule TAKE 1 CAPSULE BY MOUTH EVERY 30 DAYS. FOR OSTEOPOROSIS Patient not taking: Reported on 04/23/2015 06/21/14   Gayland Curry, DO     Physical Exam: Danley Danker  Vitals:   04/23/15 1630 04/23/15 1700 04/23/15 1715 04/23/15 1730  BP: 154/94 186/73 161/68 171/75  Pulse: 60 60 58 68  Temp:      TempSrc:      Resp: 19 11    Height:      Weight:      SpO2: 98% 98% 98% 97%     Constitutional: Vital signs reviewed. Patient is a well-developed and well-nourished in no acute distress and cooperative with exam. Alert and oriented x3.  Head: Normocephalic and atraumatic  Ear: TM normal bilaterally  Mouth: no erythema or exudates, MMM  Eyes: PERRL, EOMI, conjunctivae normal, No scleral icterus.  Neck: Supple, Trachea midline normal ROM, No JVD, mass, thyromegaly, or carotid bruit present.  Cardiovascular: RRR, S1 normal, S2 normal, no MRG, pulses symmetric and intact bilaterally  Pulmonary/Chest: CTAB, no wheezes, rales, or rhonchi  Abdominal: Soft. Non-tender, non-distended, bowel sounds are normal, no masses, organomegaly, or guarding present.  GU: no CVA tenderness Musculoskeletal: No joint deformities, erythema, or stiffness, ROM full and no nontender Ext: no edema and no  cyanosis, pulses palpable bilaterally (DP and PT)  Hematology: no cervical, inginal, or axillary adenopathy.  Neurological:Finger-nose intact, no pronator drift A&O x3, Strenght is normal and symmetric bilaterally, cranial nerve II-XII are grossly intact, no focal motor deficit, sensory intact to light touch bilaterally.  Skin: Warm, dry and intact. No rash, cyanosis, or clubbing.  Psychiatric: Normal mood and affect. speech and behavior is normal. Judgment and thought content normal. Cognition and memory are normal.      Data Review   Micro Results No results found for this or any previous visit (from the past 240 hour(s)).  Radiology Reports Dg Chest 2 View  04/23/2015  CLINICAL DATA:  Syncope EXAM: CHEST  2 VIEW COMPARISON:  04/19/2013 FINDINGS: Coarsened markings at the bases in the frontal projection not visualized laterally. Chronic hyperinflation. There is no edema, consolidation, effusion, or pneumothorax. Normal heart size and aortic contours. Remote lower thoracic compression fracture with stable moderate height loss compared to 2015 study. Chronic and stable symmetric biapical pleural scarring. IMPRESSION: No evidence of acute disease. Electronically Signed   By: Monte Fantasia M.D.   On: 04/23/2015 13:53     CBC  Recent Labs Lab 04/23/15 1215  WBC 7.3  HGB 13.3  HCT 41.1  PLT 156  MCV 94.7  MCH 30.6  MCHC 32.4  RDW 13.4    Chemistries   Recent Labs Lab 04/23/15 1215  NA 139  K 4.3  CL 104  CO2 28  GLUCOSE 211*  BUN 17  CREATININE 1.42*  CALCIUM 8.9   ------------------------------------------------------------------------------------------------------------------ estimated creatinine clearance is 47.9 mL/min (by C-G formula based on Cr of 1.42). ------------------------------------------------------------------------------------------------------------------ No results for input(s): HGBA1C in the last 72  hours. ------------------------------------------------------------------------------------------------------------------ No results for input(s): CHOL, HDL, LDLCALC, TRIG, CHOLHDL, LDLDIRECT in the last 72 hours. ------------------------------------------------------------------------------------------------------------------ No results for input(s): TSH, T4TOTAL, T3FREE, THYROIDAB in the last 72 hours.  Invalid input(s): FREET3 ------------------------------------------------------------------------------------------------------------------ No results for input(s): VITAMINB12, FOLATE, FERRITIN, TIBC, IRON, RETICCTPCT in the last 72 hours.  Coagulation profile No results for input(s): INR, PROTIME in the last 168 hours.  No results for input(s): DDIMER in the last 72 hours.  Cardiac Enzymes No results for input(s): CKMB, TROPONINI, MYOGLOBIN in the last 168 hours.  Invalid input(s): CK ------------------------------------------------------------------------------------------------------------------ Invalid input(s): POCBNP   CBG:  Recent Labs Lab 04/23/15 1211  GLUCAP 197*       EKG: Independently reviewed.  *  Assessment/Plan Active Problems:    Syncope and collapse-likely vasovagal syncope in the setting of nausea, obtain CT of the head although the patient is neurologically nonfocal, rule out intracranial etiology, doubt CVA, doubt seizure, obtain 2-D echo, carotid Doppler, admit as observation, monitor on telemetry, cycle cardiac enzymes, check TSH,since patient is on testoasterone injections will check d-dimer if elevated VQ to r/o PE  PT/OT eval  Diabetes mellitus type 2 with diabetic retinopathy Continue Januvia and Actos Check Hemoglobin A1c Patient states that he has macular degeneration and is blind in the left eye  Benign essential hypertension-uncontrolled in the ER,start novasc ,prn hydralzine     Code Status:   full Family Communication:  bedside Disposition Plan: admit   Total time spent 55 minutes.Greater than 50% of this time was spent in counseling, explanation of diagnosis, planning of further management, and coordination of care  [facility] Pager [phone]  If 7PM-7AM, please contact night-coverage [email or web address] Password [email or web address] 04/23/2015, 5:46 PM

## 2015-04-23 NOTE — ED Notes (Signed)
MD at bedside. 

## 2015-04-23 NOTE — ED Notes (Signed)
Onset today while sitting and talking on phone developed sweating and nausea. Stood up to go outside and witnessed in another room heard patient fell hit desk then to the ground. Denies pain alert answering and following commands appropriate. CBG 225

## 2015-04-23 NOTE — ED Notes (Signed)
Attempted report 

## 2015-04-24 ENCOUNTER — Ambulatory Visit (HOSPITAL_BASED_OUTPATIENT_CLINIC_OR_DEPARTMENT_OTHER): Payer: Medicare Other

## 2015-04-24 DIAGNOSIS — R079 Chest pain, unspecified: Secondary | ICD-10-CM | POA: Diagnosis not present

## 2015-04-24 DIAGNOSIS — R55 Syncope and collapse: Principal | ICD-10-CM

## 2015-04-24 LAB — COMPREHENSIVE METABOLIC PANEL
ALT: 10 U/L — ABNORMAL LOW (ref 17–63)
AST: 15 U/L (ref 15–41)
Albumin: 3.1 g/dL — ABNORMAL LOW (ref 3.5–5.0)
Alkaline Phosphatase: 40 U/L (ref 38–126)
Anion gap: 8 (ref 5–15)
BUN: 17 mg/dL (ref 6–20)
CO2: 27 mmol/L (ref 22–32)
Calcium: 8.3 mg/dL — ABNORMAL LOW (ref 8.9–10.3)
Chloride: 105 mmol/L (ref 101–111)
Creatinine, Ser: 1.31 mg/dL — ABNORMAL HIGH (ref 0.61–1.24)
GFR calc Af Amer: >60 mL/min (ref >60)
GFR calc non Af Amer: 52 mL/min — ABNORMAL LOW (ref >60)
Glucose, Bld: 109 mg/dL — ABNORMAL HIGH (ref 65–99)
Potassium: 3.9 mmol/L (ref 3.5–5.1)
Sodium: 140 mmol/L (ref 135–145)
Total Bilirubin: 0.7 mg/dL (ref 0.3–1.2)
Total Protein: 5.9 g/dL — ABNORMAL LOW (ref 6.5–8.1)

## 2015-04-24 LAB — CBC
HCT: 39 % (ref 39.0–52.0)
Hemoglobin: 12.8 g/dL — ABNORMAL LOW (ref 13.0–17.0)
MCH: 31 pg (ref 26.0–34.0)
MCHC: 32.8 g/dL (ref 30.0–36.0)
MCV: 94.4 fL (ref 78.0–100.0)
Platelets: 156 10*3/uL (ref 150–400)
RBC: 4.13 MIL/uL — ABNORMAL LOW (ref 4.22–5.81)
RDW: 13.4 % (ref 11.5–15.5)
WBC: 7.7 10*3/uL (ref 4.0–10.5)

## 2015-04-24 LAB — HEMOGLOBIN A1C
Hgb A1c MFr Bld: 6.9 % — ABNORMAL HIGH (ref 4.8–5.6)
Mean Plasma Glucose: 151 mg/dL

## 2015-04-24 LAB — URINE CULTURE: Culture: NO GROWTH

## 2015-04-24 LAB — GLUCOSE, CAPILLARY
Glucose-Capillary: 103 mg/dL — ABNORMAL HIGH (ref 65–99)
Glucose-Capillary: 129 mg/dL — ABNORMAL HIGH (ref 65–99)

## 2015-04-24 LAB — TROPONIN I: Troponin I: <0.03 ng/mL (ref <0.031)

## 2015-04-24 LAB — D-DIMER, QUANTITATIVE: D-Dimer, Quant: 0.74 ug/mL-FEU — ABNORMAL HIGH (ref 0.00–0.50)

## 2015-04-24 NOTE — Progress Notes (Signed)
Pt had a 5 beat run of vtach while getting a 2D echo. MD notified. Will continue to monitor.

## 2015-04-24 NOTE — Discharge Summary (Signed)
Physician Discharge Summary  Scott Willis [id number] DOB: Jul 06, 1940 DOA: 04/23/2015  PCP: Myriam Jacobson, MD  Admit date: 04/23/2015 Discharge date: 04/24/2015  Time spent: 25 minutes  Recommendations for Outpatient Follow-up:  1. Dr. [name] to place Holter Monitor   Discharge Diagnoses:  Active Problems:   Syncope   Syncope and collapse   Discharge Condition: improved  Diet recommendation: cardiac  Filed Weights   04/23/15 1153 04/23/15 1200 04/23/15 1925  Weight: 78.614 kg (173 lb 5 oz) 78.472 kg (173 lb) 74.8 kg (164 lb 14.5 oz)    History of present illness:  75 year old male with history of diabetes with peripheral neuropathy, macular degeneration, benign essential hypertension, followed by Dr. [name] for his cardiac issues at least twice a year who presents to the ER, After the syncopal episode. The patient woke up as usual at 5 AM, did not feel so well, had some nausea, some weakness but no focal symptoms of fever, chest pain, palpitations, lightheadedness, vertigo. Patient states that he just started taking Januvia and Zanaflex yesterday after coming out of the donut hole when he could not afford both of these medications for 3 months. No other changes in his medication. Otherwise he has been in his usual state of health. Patient states that he was probably out for at least 20 minutes. Following his syncopal episode. Patient called 911. He had 3 episodes of vomiting enroute to the ED via EMS as well as in the ER. Nausea was relieved by Zofran. Patient states that he had a nuclear stress test about 3 years ago and it was within normal limits. Otherwise he states that he is very active and reasonably good health. Blood pressure in the ER as elevated at 99991111 systolic. Telemetry shows PVCs, cr 1.42, CBG 197.  Hospital Course:  Syncope-- unclear -orthos negative -echo done -CE negative x 2 -d dimer <1 so low prob -4 beat v tac and 2 sec pause-- spoke with Dr.  [name] who will place event monitor today at his office  Procedures:  echo  Consultations:    Discharge Exam: Filed Vitals:   04/24/15 0528 04/24/15 0939  BP: 143/56 128/53  Pulse: 56 57  Temp: 97.8 F (36.6 C)   Resp: 20      Discharge Instructions   Discharge Instructions    Diet - low sodium heart healthy    Complete by:  As directed      Diet Carb Modified    Complete by:  As directed      Discharge instructions    Complete by:  As directed   Go to Dr. [name] office for event monitor after 1:30 PM     Increase activity slowly    Complete by:  As directed           Current Discharge Medication List    CONTINUE these medications which have NOT CHANGED   Details  diazepam (VALIUM) 5 MG tablet Take 5 mg by mouth See admin instructions. 1 by mouth on the day of eye surgery ONCE every 16 weeks  Dr.[name]    finasteride (PROSCAR) 5 MG tablet Take 5 mg by mouth at bedtime.     gabapentin (NEURONTIN) 600 MG tablet Take 600 mg by mouth 3 (three) times daily.    HYDROcodone-acetaminophen (NORCO/VICODIN) 5-325 MG per tablet Take 1 tablet by mouth See admin instructions. Once every 16 weeks before eyes injections    lidocaine (LIDODERM) 5 % Place 1 patch onto the skin daily as  needed (PAIN). Remove & Discard patch within 12 hours or as directed by MD    moxifloxacin (VIGAMOX) 0.5 % ophthalmic solution Place 1 drop into the right eye 3 (three) times daily. Every 16 weeks for 4 days after surgery    pioglitazone (ACTOS) 30 MG tablet Take 30 mg by mouth at bedtime. Dr.[name]    Polyethyl Glycol-Propyl Glycol (SYSTANE OP) Apply 1 drop to eye as needed (dry eyes).     sitaGLIPtin (JANUVIA) 100 MG tablet Take 100 mg by mouth daily.     tamsulosin (FLOMAX) 0.4 MG CAPS capsule Take 0.4 mg by mouth daily after supper.     testosterone cypionate (DEPO-TESTOSTERONE) 100 MG/ML injection Inject 1 mL (100 mg total) into the muscle every 28 (twenty-eight) days. For IM use  only and Generic is fine Qty: 10 mL, Refills: 0    venlafaxine XR (EFFEXOR-XR) 75 MG 24 hr capsule TAKE 1 CAPSULE (75 MG TOTAL) BY MOUTH DAILY WITH BREAKFAST. Qty: 90 capsule, Refills: 1      STOP taking these medications     Vitamin D, Ergocalciferol, (DRISDOL) 50000 UNITS CAPS capsule        Allergies  Allergen Reactions  . Bee Pollen Anaphylaxis  . Tape     Any medical tape, Rash   . Flurox [Fluorescein-Benoxinate]     Rash   . Metformin And Related     Severe Diarrhea   . Morphine And Related     Kidney failure   . Statins     Loss of appetite   Follow-up Information    Follow up with ROBERTS, Sharol Given, MD In 1 week.   Specialty:  Internal Medicine   Contact information:   [place], [address] [place] [address] [phone]       Follow up with Ezzard Standing, MD.   Specialty:  Cardiology   Why:  go to his office after D/c for event monitor   Contact information:   [address] [place] [address] [phone]        The results of significant diagnostics from this hospitalization (including imaging, microbiology, ancillary and laboratory) are listed below for reference.    Significant Diagnostic Studies: Dg Chest 2 View  04/23/2015  CLINICAL DATA:  Syncope EXAM: CHEST  2 VIEW COMPARISON:  04/19/2013 FINDINGS: Coarsened markings at the bases in the frontal projection not visualized laterally. Chronic hyperinflation. There is no edema, consolidation, effusion, or pneumothorax. Normal heart size and aortic contours. Remote lower thoracic compression fracture with stable moderate height loss compared to 2015 study. Chronic and stable symmetric biapical pleural scarring. IMPRESSION: No evidence of acute disease. Electronically Signed   By: Monte Fantasia M.D.   On: 04/23/2015 13:53   Ct Head Wo Contrast  04/23/2015  CLINICAL DATA:  Weakness and syncope. EXAM: CT HEAD WITHOUT CONTRAST TECHNIQUE: Contiguous axial images were obtained  from the base of the skull through the vertex without intravenous contrast. COMPARISON:  02/21/2008 FINDINGS: The brain demonstrates no evidence of hemorrhage, infarction, edema, mass effect, extra-axial fluid collection, hydrocephalus or mass lesion. The skull is unremarkable. Stable mucosal thickening with probable retention cyst in the right maxillary antrum. IMPRESSION: No acute findings. Electronically Signed   By: Aletta Edouard M.D.   On: 04/23/2015 19:02    Microbiology: Recent Results (from the past 240 hour(s))  Urine culture     Status: None (Preliminary result)   Collection Time: 04/23/15  1:52 PM  Result Value Ref Range  Status   Specimen Description URINE, RANDOM  Final   Special Requests NONE  Final   Culture NO GROWTH < 12 HOURS  Final   Report Status PENDING  Incomplete     Labs: Basic Metabolic Panel:  Recent Labs Lab 04/23/15 1215 04/23/15 1810 04/24/15 0335  NA 139  --  140  K 4.3  --  3.9  CL 104  --  105  CO2 28  --  27  GLUCOSE 211*  --  109*  BUN 17  --  17  CREATININE 1.42* 1.27* 1.31*  CALCIUM 8.9  --  8.3*  MG  --  2.1  --    Liver Function Tests:  Recent Labs Lab 04/23/15 1810 04/24/15 0335  AST 18 15  ALT 13* 10*  ALKPHOS 46 40  BILITOT 0.7 0.7  PROT 7.0 5.9*  ALBUMIN 3.6 3.1*   No results for input(s): LIPASE, AMYLASE in the last 168 hours. No results for input(s): AMMONIA in the last 168 hours. CBC:  Recent Labs Lab 04/23/15 1215 04/23/15 1810 04/24/15 0335  WBC 7.3 7.4 7.7  HGB 13.3 13.9 12.8*  HCT 41.1 42.5 39.0  MCV 94.7 94.7 94.4  PLT 156 170 156   Cardiac Enzymes:  Recent Labs Lab 04/24/15 1138  TROPONINI <0.03   BNP: BNP (last 3 results) No results for input(s): BNP in the last 8760 hours.  ProBNP (last 3 results) No results for input(s): PROBNP in the last 8760 hours.  CBG:  Recent Labs Lab 04/23/15 1211 04/23/15 2153 04/24/15 0610 04/24/15 1104  GLUCAP 197* 133* 103* 129*        Signed:  Yuliet Needs U Charmian Forbis DO.  [facility] 04/24/2015, 1:06 PM

## 2015-04-24 NOTE — Progress Notes (Signed)
OT Cancellation Note  Patient Details Name: Scott Willis MRN: [id number] DOB: 29-Aug-1940   Cancelled Treatment:    Reason Eval/Treat Not Completed: OT screened, no needs identified, will sign off. According to PT's note and per verbal discussion with PT, pt is currently close to baseline. Please send text page to OT services if any questions, concerns, or with new orders: [phone] OR call office at [phone]. Thank you for the order.    Chrys Racer , MS, OTR/L, CLT Pager: [phone]  04/24/2015, 11:16 AM

## 2015-04-24 NOTE — Care Management Obs Status (Signed)
[facility] NOTIFICATION   Patient Details  Name: MONA THOMMEN MRN: [id number] Date of Birth: 1940/11/18   Medicare Observation Status Notification Given:  No (<24 hours)    Maryclare Labrador, RN 04/24/2015, 2:00 PM

## 2015-04-24 NOTE — Evaluation (Signed)
Physical Therapy Evaluation Patient Details Name: Scott Willis MRN: [id number] DOB: [birth year] Today's Date: 04/24/2015   History of Present Illness  pt is a 75 y/o malle with h/o peripheral neuropathy, macular degeneration, HTN, admitted after syncopal event with fall.  Work up has not determined any definitive cause for the syncope.  Clinical Impression  Pt is at or close to baseline functioning and should be safe at home with family/friends assisting as usual. There are no further acute PT needs.  Will sign off at this time.     Follow Up Recommendations No PT follow up    Equipment Recommendations  None recommended by PT    Recommendations for Other Services       Precautions / Restrictions        Mobility  Bed Mobility Overal bed mobility: Modified Independent                Transfers Overall transfer level: Modified independent                  Ambulation/Gait Ambulation/Gait assistance: Independent Ambulation Distance (Feet): 300 Feet   Gait Pattern/deviations: Step-through pattern Gait velocity: neighborhood safe Gait velocity interpretation: Below normal speed for age/gender (due to visual impairment) General Gait Details: steady overall, manages to negotiate his environment   Stairs Stairs: Yes Stairs assistance: Modified independent (Device/Increase time) Stair Management: One rail Right;Alternating pattern;Forwards Number of Stairs: 3    Wheelchair Mobility    Modified Rankin (Stroke Patients Only)       Balance Overall balance assessment: Modified Independent;Needs assistance Sitting-balance support: No upper extremity supported Sitting balance-Leahy Scale: Good     Standing balance support: No upper extremity supported Standing balance-Leahy Scale: Good                               Pertinent Vitals/Pain Pain Assessment: Faces Faces Pain Scale: Hurts a little bit Pain Location: R shoulder acting  up. Pain Descriptors / Indicators: Aching Pain Intervention(s): Monitored during session    Home Living Family/patient expects to be discharged to:: Other (Comment)                      Prior Function Level of Independence: Independent               Hand Dominance        Extremity/Trunk Assessment   Upper Extremity Assessment: Overall WFL for tasks assessed           Lower Extremity Assessment: Overall WFL for tasks assessed         Communication   Communication: No difficulties  Cognition Arousal/Alertness: Awake/alert Behavior During Therapy: WFL for tasks assessed/performed Overall Cognitive Status: Within Functional Limits for tasks assessed                      General Comments General comments (skin integrity, edema, etc.): VSS, pt able to talk and turn to talk and manage to ambulate fairly straight with visual impairment    Exercises        Assessment/Plan    PT Assessment Patent does not need any further PT services  PT Diagnosis     PT Problem List    PT Treatment Interventions     PT Goals (Current goals can be found in the Care Plan section) Acute Rehab PT Goals PT Goal Formulation: All assessment and education complete, DC therapy  Frequency     Barriers to discharge        Co-evaluation               End of Session   Activity Tolerance: Patient tolerated treatment well Patient left: with call bell/phone within reach (up in room) Nurse Communication: Mobility status    Functional Assessment Tool Used: clinical judgment Functional Limitation: Mobility: Walking and moving around Mobility: Walking and Moving Around Current Status [phone]): 0 percent impaired, limited or restricted Mobility: Walking and Moving Around Goal Status [phone]): 0 percent impaired, limited or restricted Mobility: Walking and Moving Around Discharge Status [phone]): 0 percent impaired, limited or restricted    Time: 1015-1035 PT  Time Calculation (min) (ACUTE ONLY): 20 min   Charges:   PT Evaluation $PT Eval Low Complexity: 1 Procedure     PT G Codes:   PT G-Codes **NOT FOR INPATIENT CLASS** Functional Assessment Tool Used: clinical judgment Functional Limitation: Mobility: Walking and moving around Mobility: Walking and Moving Around Current Status [id number]): 0 percent impaired, limited or restricted Mobility: Walking and Moving Around Goal Status [id number]): 0 percent impaired, limited or restricted Mobility: Walking and Moving Around Discharge Status [id number]): 0 percent impaired, limited or restricted    Hermes Wafer, Tessie Fass 04/24/2015, 11:07 AM 04/24/2015  Donnella Sham, PT [phone]  (pager)

## 2015-04-24 NOTE — Progress Notes (Signed)
  Echocardiogram 2D Echocardiogram has been performed.  Jennette Dubin 04/24/2015, 11:34 AM

## 2015-04-24 NOTE — Care Management Note (Signed)
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  CSW consulted; no needs determined.  CM assessed pt prior to discharge; no needs determined.   Expected Discharge Date:  04/25/15               Expected Discharge Plan:  Home/Self Care  In-House Referral:  Clinical Social Work  Discharge planning Services  CM Consult  Post Acute Care Choice:    Choice offered to:     DME Arranged:    DME Agency:     HH Arranged:    HH Agency:     Status of Service:  Completed, signed off  Medicare Important Message Given:    Date Medicare IM Given:    Medicare IM give by:    Date Additional Medicare IM Given:    Additional Medicare Important Message give by:     If discussed at [place] of Stay Meetings, dates discussed:    Additional Comments:  Maryclare Labrador, RN 04/24/2015, 2:06 PM

## 2015-05-20 ENCOUNTER — Encounter (INDEPENDENT_AMBULATORY_CARE_PROVIDER_SITE_OTHER): Payer: Medicare Other | Admitting: Ophthalmology

## 2015-05-20 DIAGNOSIS — H353231 Exudative age-related macular degeneration, bilateral, with active choroidal neovascularization: Secondary | ICD-10-CM | POA: Diagnosis not present

## 2015-05-20 DIAGNOSIS — H43813 Vitreous degeneration, bilateral: Secondary | ICD-10-CM | POA: Diagnosis not present

## 2015-05-20 DIAGNOSIS — H35033 Hypertensive retinopathy, bilateral: Secondary | ICD-10-CM

## 2015-05-20 DIAGNOSIS — I10 Essential (primary) hypertension: Secondary | ICD-10-CM | POA: Diagnosis not present

## 2015-09-02 ENCOUNTER — Encounter (INDEPENDENT_AMBULATORY_CARE_PROVIDER_SITE_OTHER): Payer: Medicare Other | Admitting: Ophthalmology

## 2015-09-02 DIAGNOSIS — H353231 Exudative age-related macular degeneration, bilateral, with active choroidal neovascularization: Secondary | ICD-10-CM | POA: Diagnosis not present

## 2015-09-02 DIAGNOSIS — H35033 Hypertensive retinopathy, bilateral: Secondary | ICD-10-CM | POA: Diagnosis not present

## 2015-09-02 DIAGNOSIS — I10 Essential (primary) hypertension: Secondary | ICD-10-CM | POA: Diagnosis not present

## 2015-09-02 DIAGNOSIS — H43813 Vitreous degeneration, bilateral: Secondary | ICD-10-CM | POA: Diagnosis not present

## 2015-10-28 ENCOUNTER — Encounter (INDEPENDENT_AMBULATORY_CARE_PROVIDER_SITE_OTHER): Payer: Medicare Other | Admitting: Ophthalmology

## 2015-10-28 DIAGNOSIS — H35033 Hypertensive retinopathy, bilateral: Secondary | ICD-10-CM | POA: Diagnosis not present

## 2015-10-28 DIAGNOSIS — I10 Essential (primary) hypertension: Secondary | ICD-10-CM | POA: Diagnosis not present

## 2015-10-28 DIAGNOSIS — H353231 Exudative age-related macular degeneration, bilateral, with active choroidal neovascularization: Secondary | ICD-10-CM

## 2015-10-28 DIAGNOSIS — H43813 Vitreous degeneration, bilateral: Secondary | ICD-10-CM | POA: Diagnosis not present

## 2015-12-23 ENCOUNTER — Encounter (INDEPENDENT_AMBULATORY_CARE_PROVIDER_SITE_OTHER): Payer: Medicare Other | Admitting: Ophthalmology

## 2015-12-23 DIAGNOSIS — H43813 Vitreous degeneration, bilateral: Secondary | ICD-10-CM

## 2015-12-23 DIAGNOSIS — H353231 Exudative age-related macular degeneration, bilateral, with active choroidal neovascularization: Secondary | ICD-10-CM

## 2015-12-23 DIAGNOSIS — H35033 Hypertensive retinopathy, bilateral: Secondary | ICD-10-CM

## 2015-12-23 DIAGNOSIS — I10 Essential (primary) hypertension: Secondary | ICD-10-CM

## 2016-02-17 ENCOUNTER — Encounter (INDEPENDENT_AMBULATORY_CARE_PROVIDER_SITE_OTHER): Payer: Medicare Other | Admitting: Ophthalmology

## 2016-02-17 DIAGNOSIS — H353231 Exudative age-related macular degeneration, bilateral, with active choroidal neovascularization: Secondary | ICD-10-CM | POA: Diagnosis not present

## 2016-02-17 DIAGNOSIS — H43813 Vitreous degeneration, bilateral: Secondary | ICD-10-CM

## 2016-02-17 DIAGNOSIS — H35033 Hypertensive retinopathy, bilateral: Secondary | ICD-10-CM

## 2016-02-17 DIAGNOSIS — I10 Essential (primary) hypertension: Secondary | ICD-10-CM | POA: Diagnosis not present

## 2016-04-27 ENCOUNTER — Encounter (INDEPENDENT_AMBULATORY_CARE_PROVIDER_SITE_OTHER): Payer: PPO | Admitting: Ophthalmology

## 2016-04-27 DIAGNOSIS — I10 Essential (primary) hypertension: Secondary | ICD-10-CM

## 2016-04-27 DIAGNOSIS — H35033 Hypertensive retinopathy, bilateral: Secondary | ICD-10-CM | POA: Diagnosis not present

## 2016-04-27 DIAGNOSIS — H353231 Exudative age-related macular degeneration, bilateral, with active choroidal neovascularization: Secondary | ICD-10-CM | POA: Diagnosis not present

## 2016-04-27 DIAGNOSIS — H43813 Vitreous degeneration, bilateral: Secondary | ICD-10-CM

## 2016-06-01 DIAGNOSIS — E1165 Type 2 diabetes mellitus with hyperglycemia: Secondary | ICD-10-CM | POA: Diagnosis not present

## 2016-06-01 DIAGNOSIS — D485 Neoplasm of uncertain behavior of skin: Secondary | ICD-10-CM | POA: Diagnosis not present

## 2016-06-26 DIAGNOSIS — E1140 Type 2 diabetes mellitus with diabetic neuropathy, unspecified: Secondary | ICD-10-CM | POA: Diagnosis not present

## 2016-06-26 DIAGNOSIS — I10 Essential (primary) hypertension: Secondary | ICD-10-CM | POA: Diagnosis not present

## 2016-06-26 DIAGNOSIS — Z8711 Personal history of peptic ulcer disease: Secondary | ICD-10-CM | POA: Diagnosis not present

## 2016-06-26 DIAGNOSIS — E785 Hyperlipidemia, unspecified: Secondary | ICD-10-CM | POA: Diagnosis not present

## 2016-06-26 DIAGNOSIS — I739 Peripheral vascular disease, unspecified: Secondary | ICD-10-CM | POA: Diagnosis not present

## 2016-07-06 ENCOUNTER — Encounter (INDEPENDENT_AMBULATORY_CARE_PROVIDER_SITE_OTHER): Payer: PPO | Admitting: Ophthalmology

## 2016-07-06 DIAGNOSIS — H35033 Hypertensive retinopathy, bilateral: Secondary | ICD-10-CM

## 2016-07-06 DIAGNOSIS — H353231 Exudative age-related macular degeneration, bilateral, with active choroidal neovascularization: Secondary | ICD-10-CM

## 2016-07-06 DIAGNOSIS — H43813 Vitreous degeneration, bilateral: Secondary | ICD-10-CM

## 2016-07-06 DIAGNOSIS — I10 Essential (primary) hypertension: Secondary | ICD-10-CM

## 2016-08-24 DIAGNOSIS — H9113 Presbycusis, bilateral: Secondary | ICD-10-CM | POA: Diagnosis not present

## 2016-08-24 DIAGNOSIS — R1032 Left lower quadrant pain: Secondary | ICD-10-CM | POA: Diagnosis not present

## 2016-08-24 DIAGNOSIS — Z1389 Encounter for screening for other disorder: Secondary | ICD-10-CM | POA: Diagnosis not present

## 2016-08-24 DIAGNOSIS — E785 Hyperlipidemia, unspecified: Secondary | ICD-10-CM | POA: Diagnosis not present

## 2016-08-24 DIAGNOSIS — I70219 Atherosclerosis of native arteries of extremities with intermittent claudication, unspecified extremity: Secondary | ICD-10-CM | POA: Diagnosis not present

## 2016-08-24 DIAGNOSIS — R002 Palpitations: Secondary | ICD-10-CM | POA: Diagnosis not present

## 2016-08-24 DIAGNOSIS — E1165 Type 2 diabetes mellitus with hyperglycemia: Secondary | ICD-10-CM | POA: Diagnosis not present

## 2016-08-24 DIAGNOSIS — Z0000 Encounter for general adult medical examination without abnormal findings: Secondary | ICD-10-CM | POA: Diagnosis not present

## 2016-08-24 DIAGNOSIS — R42 Dizziness and giddiness: Secondary | ICD-10-CM | POA: Diagnosis not present

## 2016-08-24 DIAGNOSIS — Z87891 Personal history of nicotine dependence: Secondary | ICD-10-CM | POA: Diagnosis not present

## 2016-09-14 DIAGNOSIS — K219 Gastro-esophageal reflux disease without esophagitis: Secondary | ICD-10-CM | POA: Diagnosis not present

## 2016-09-14 DIAGNOSIS — Z86010 Personal history of colonic polyps: Secondary | ICD-10-CM | POA: Diagnosis not present

## 2016-09-14 DIAGNOSIS — E119 Type 2 diabetes mellitus without complications: Secondary | ICD-10-CM | POA: Diagnosis not present

## 2016-09-21 ENCOUNTER — Encounter (INDEPENDENT_AMBULATORY_CARE_PROVIDER_SITE_OTHER): Payer: PPO | Admitting: Ophthalmology

## 2016-09-21 DIAGNOSIS — I10 Essential (primary) hypertension: Secondary | ICD-10-CM | POA: Diagnosis not present

## 2016-09-21 DIAGNOSIS — H35033 Hypertensive retinopathy, bilateral: Secondary | ICD-10-CM | POA: Diagnosis not present

## 2016-09-21 DIAGNOSIS — H353231 Exudative age-related macular degeneration, bilateral, with active choroidal neovascularization: Secondary | ICD-10-CM

## 2016-09-21 DIAGNOSIS — H43813 Vitreous degeneration, bilateral: Secondary | ICD-10-CM

## 2016-09-24 DIAGNOSIS — K635 Polyp of colon: Secondary | ICD-10-CM | POA: Diagnosis not present

## 2016-09-24 DIAGNOSIS — D125 Benign neoplasm of sigmoid colon: Secondary | ICD-10-CM | POA: Diagnosis not present

## 2016-09-24 DIAGNOSIS — D123 Benign neoplasm of transverse colon: Secondary | ICD-10-CM | POA: Diagnosis not present

## 2016-09-24 DIAGNOSIS — K6389 Other specified diseases of intestine: Secondary | ICD-10-CM | POA: Diagnosis not present

## 2016-09-24 DIAGNOSIS — Z86010 Personal history of colonic polyps: Secondary | ICD-10-CM | POA: Diagnosis not present

## 2016-11-30 ENCOUNTER — Encounter (INDEPENDENT_AMBULATORY_CARE_PROVIDER_SITE_OTHER): Payer: PPO | Admitting: Ophthalmology

## 2016-11-30 DIAGNOSIS — H35033 Hypertensive retinopathy, bilateral: Secondary | ICD-10-CM | POA: Diagnosis not present

## 2016-11-30 DIAGNOSIS — I10 Essential (primary) hypertension: Secondary | ICD-10-CM | POA: Diagnosis not present

## 2016-11-30 DIAGNOSIS — H43813 Vitreous degeneration, bilateral: Secondary | ICD-10-CM | POA: Diagnosis not present

## 2016-11-30 DIAGNOSIS — H353231 Exudative age-related macular degeneration, bilateral, with active choroidal neovascularization: Secondary | ICD-10-CM | POA: Diagnosis not present

## 2016-12-27 IMAGING — CT CT HEAD W/O CM
2 series · 15 of 30 positions shown, 17 images · non-contrast
Comparison: 02/21/2008

CLINICAL DATA: Weakness and syncope.

EXAM:
CT HEAD WITHOUT CONTRAST
TECHNIQUE: Contiguous axial images were obtained from the base of the skull
through the vertex without intravenous contrast.

[Series 2: head without · axial · non-contrast · 0.43mm/px · z∈[-210,-90]mm · 7 of 32 slices shown, 9 images]
[im 4/32  brain]
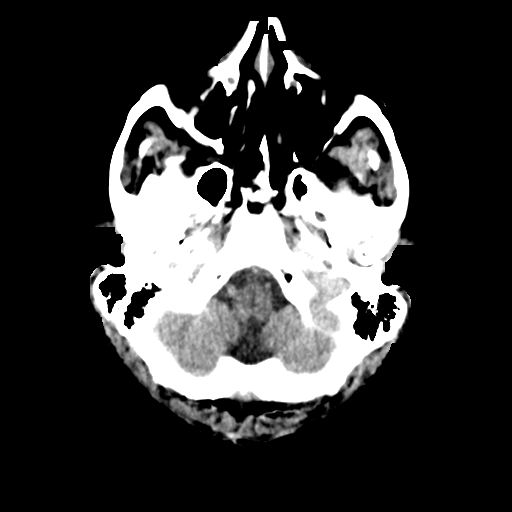
[im 4/32  bone]
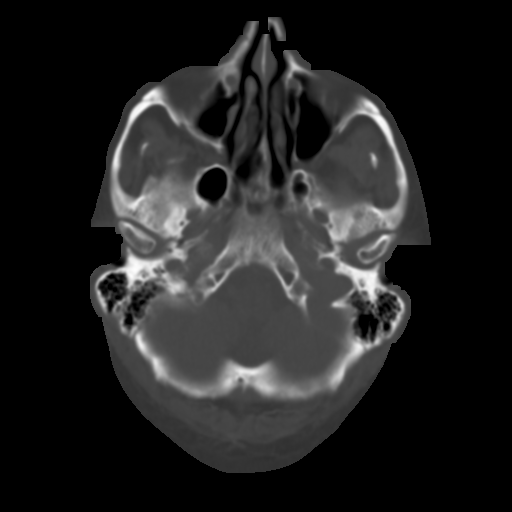
[im 8/32  brain]
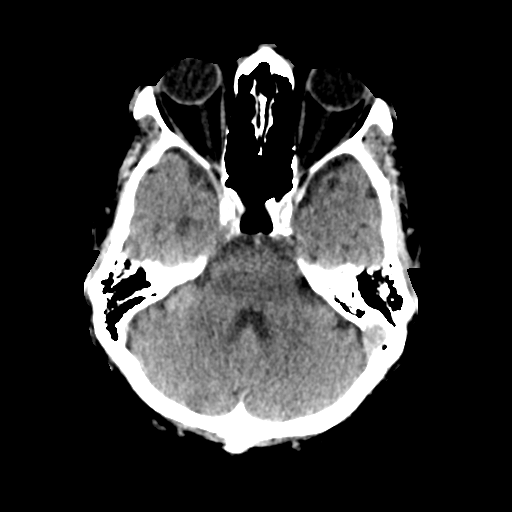
[im 12/32  brain]
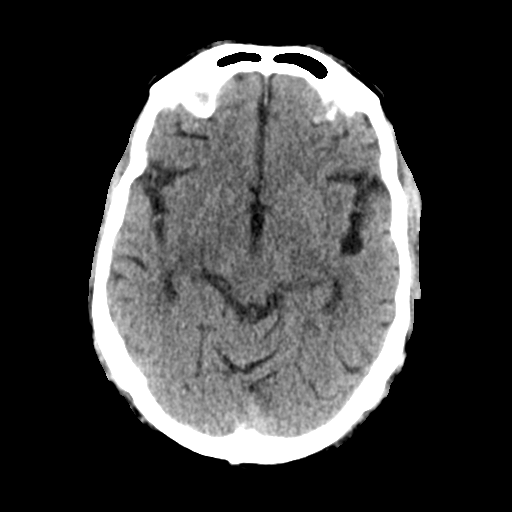
[im 16/32  brain]
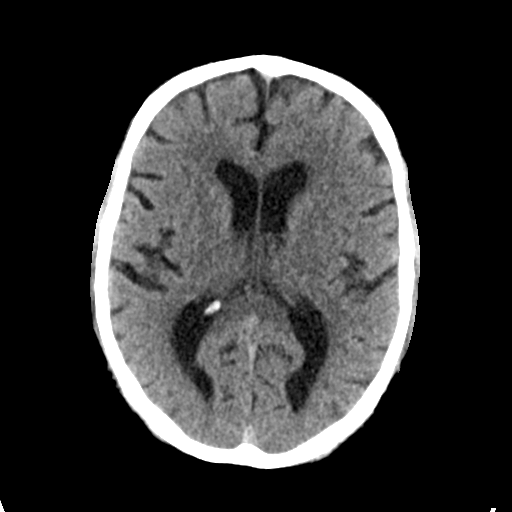
[im 20/32  brain]
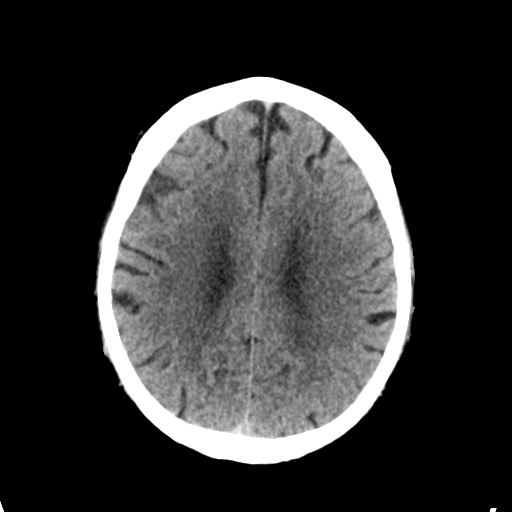
[im 20/32  bone]
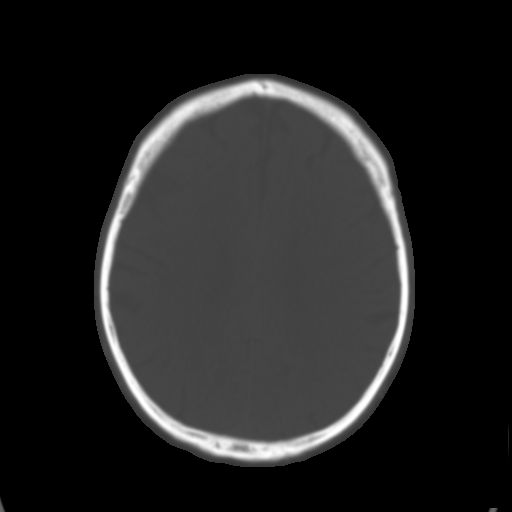
[im 24/32  brain]
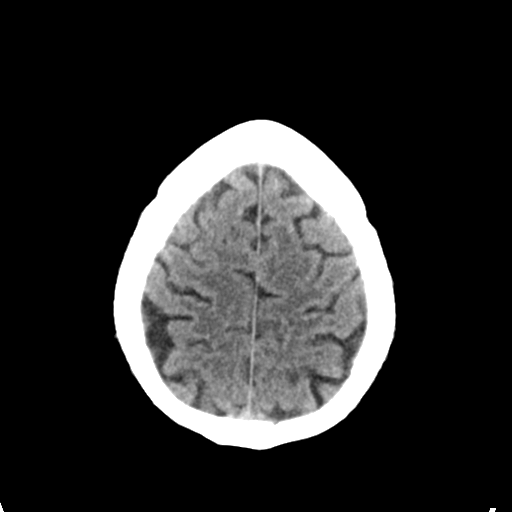
[im 28/32  brain]
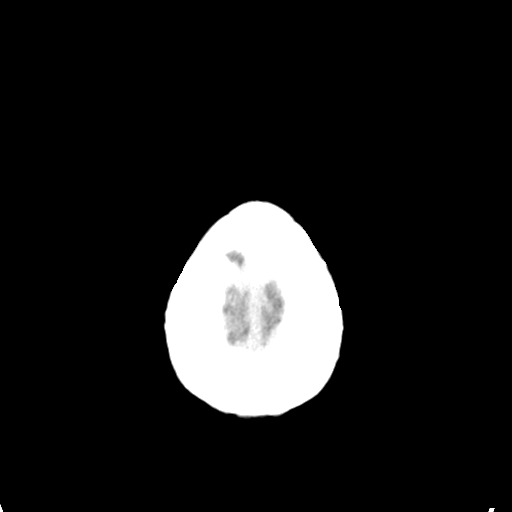

[Series 3: head bone · axial · 0.43mm/px · z∈[-211,-85]mm · 8 of 79 slices shown]
[im 8/79  bone]
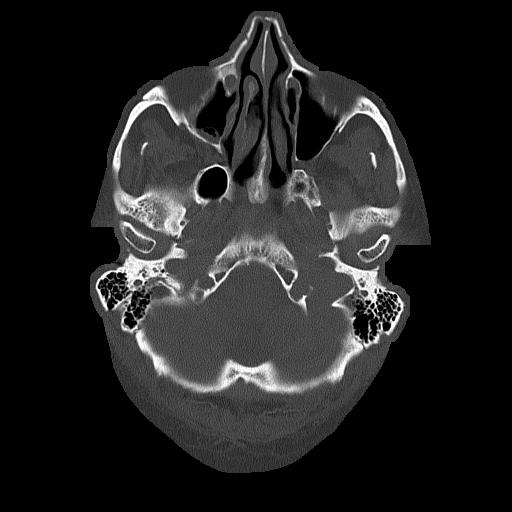
[im 16/79  bone]
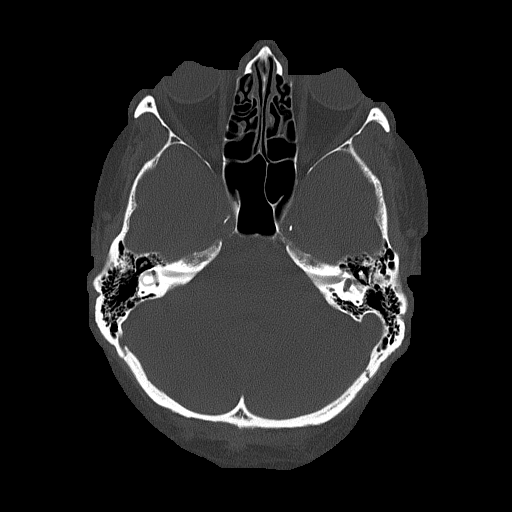
[im 24/79  bone]
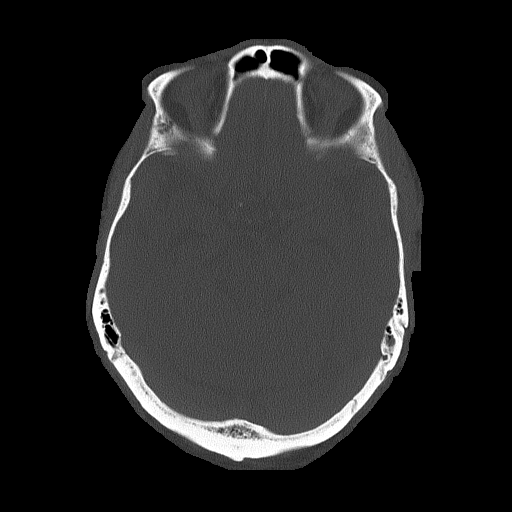
[im 36/79  bone]
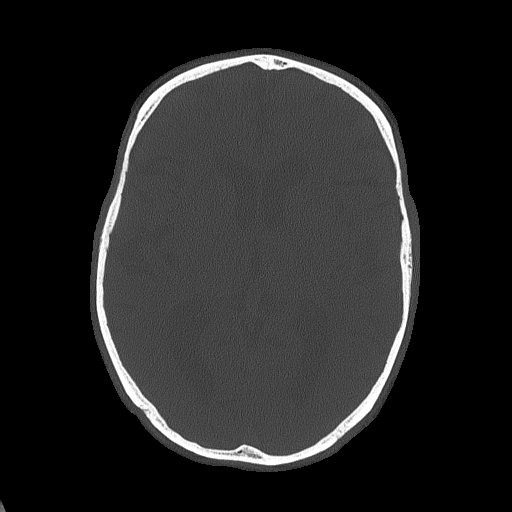
[im 43/79  bone]
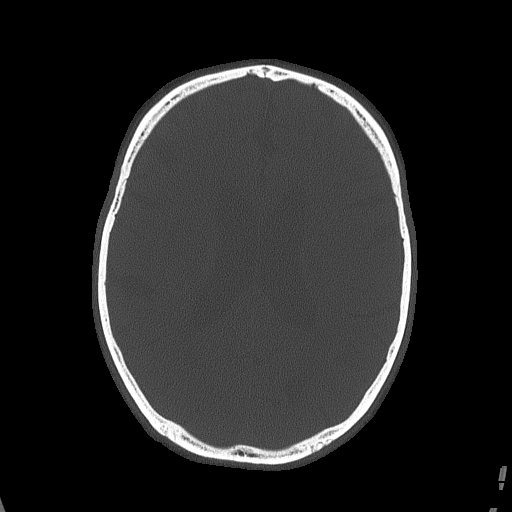
[im 55/79  bone]
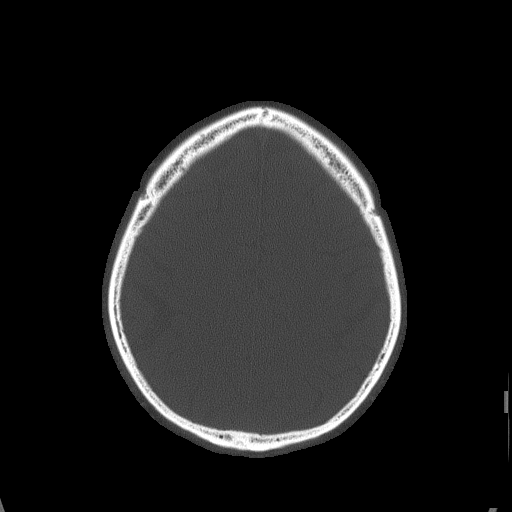
[im 63/79  bone]
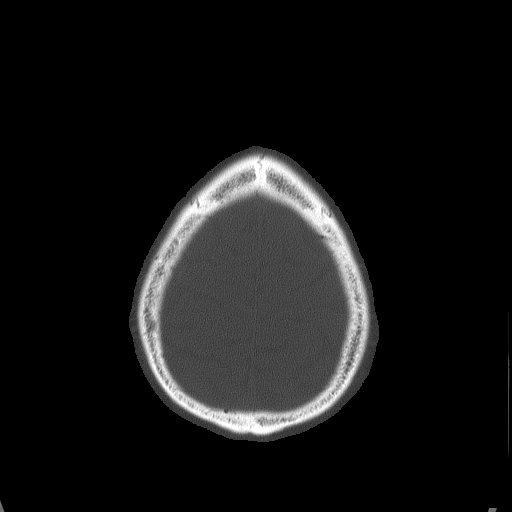
[im 71/79  bone]
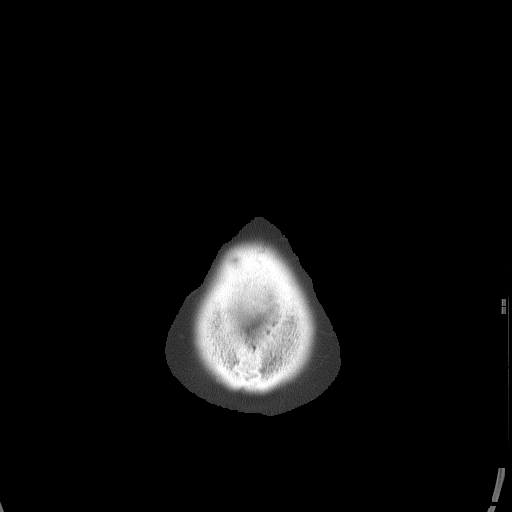

[15 of 30 positions shown; findings below may reference images not displayed]

FINDINGS: The brain demonstrates no evidence of hemorrhage, infarction, edema,
mass effect, extra-axial fluid collection, hydrocephalus or mass
lesion. The skull is unremarkable. Stable mucosal thickening with
probable retention cyst in the right maxillary antrum.
IMPRESSION: No acute findings.

## 2016-12-27 IMAGING — DX DG CHEST 2V
3 series · 3 of 3 positions shown · non-contrast
Comparison: 04/19/2013

CLINICAL DATA: Syncope

EXAM:
CHEST  2 VIEW

[chest lat (1 of 2)]
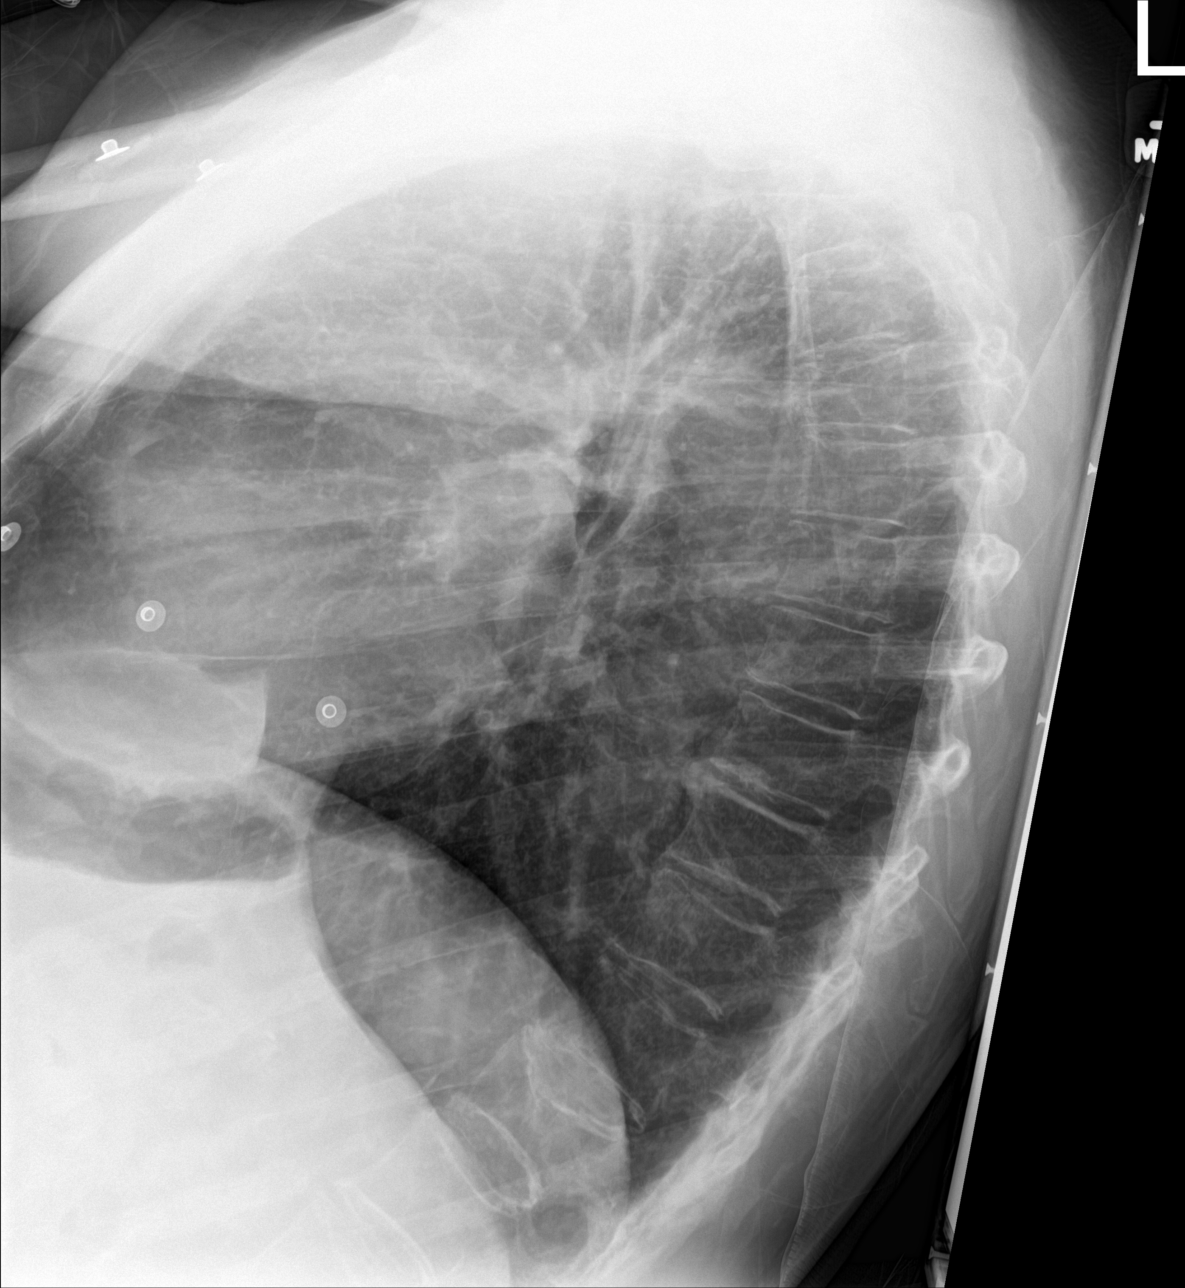

[chest ap]
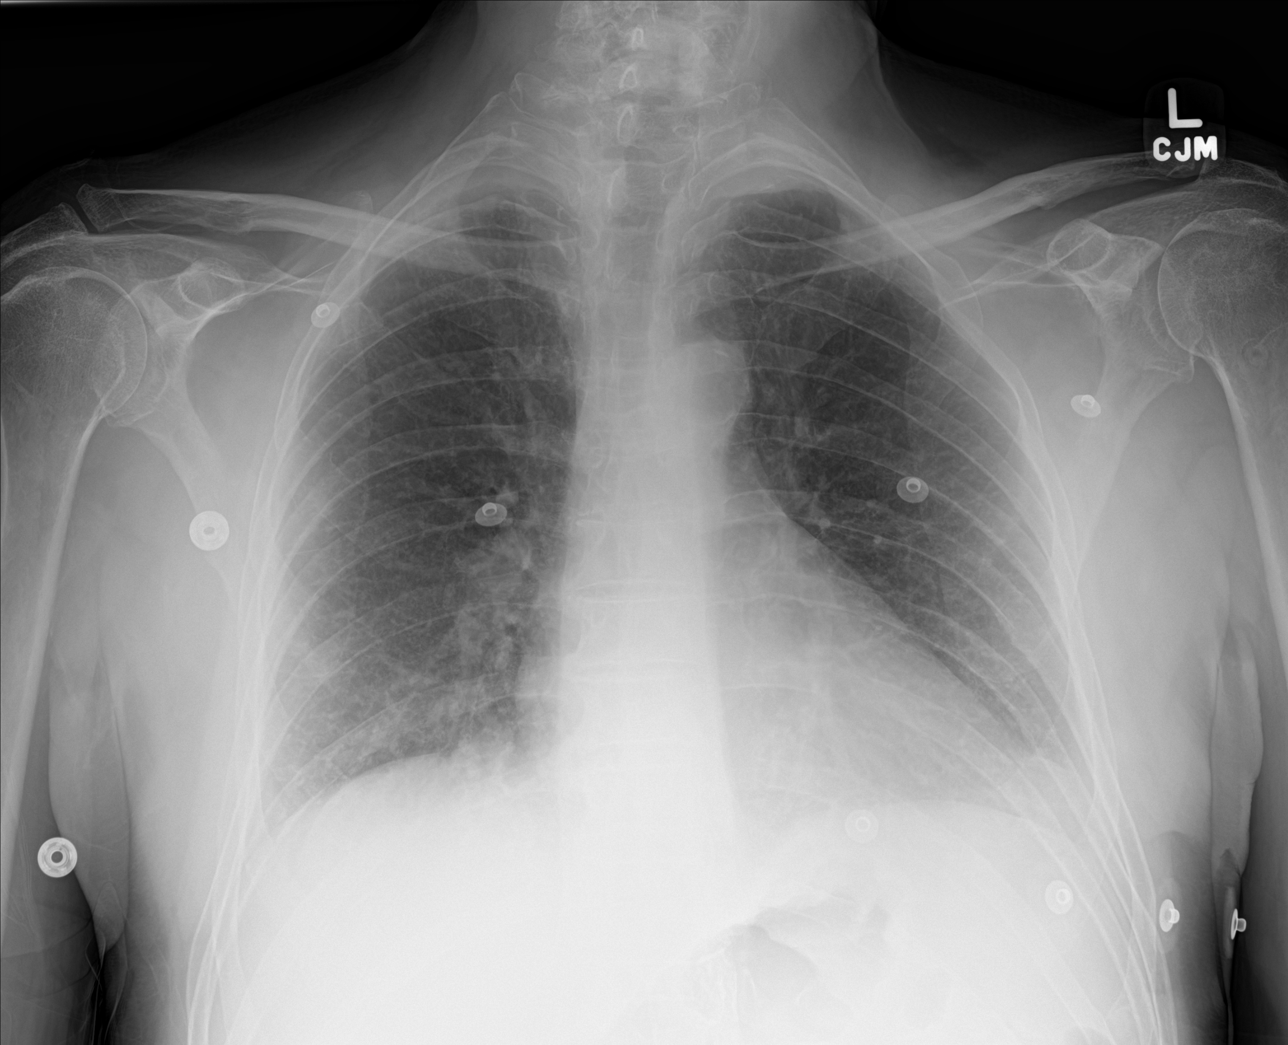

[chest lat (2 of 2)]
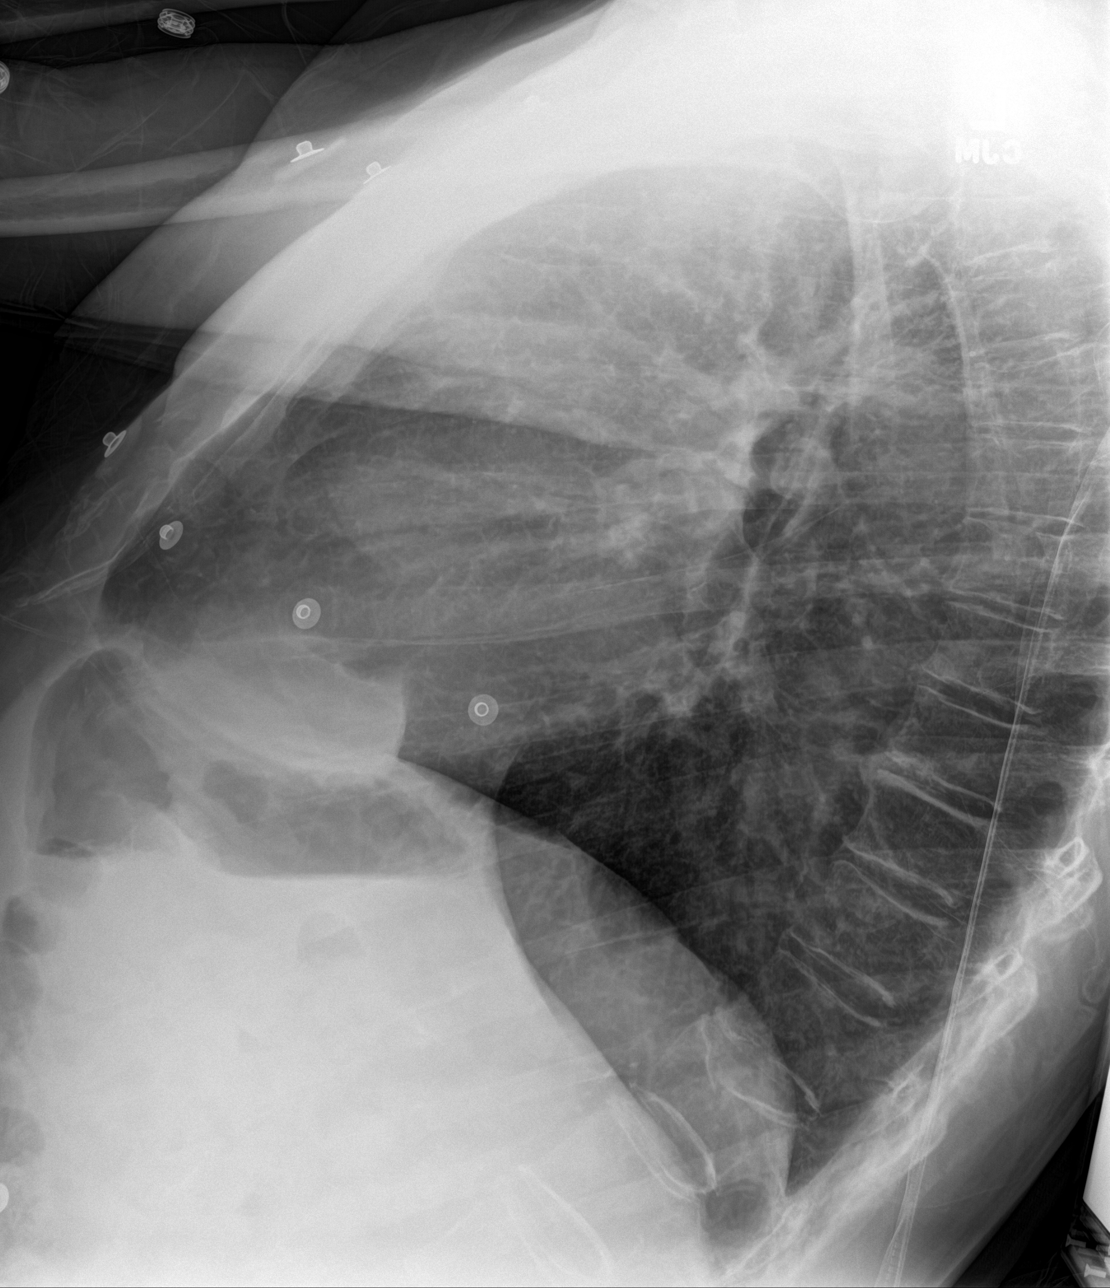

[3 of 3 positions shown; findings below may reference images not displayed]

FINDINGS: Coarsened markings at the bases in the frontal projection not
visualized laterally. Chronic hyperinflation. There is no edema,
consolidation, effusion, or pneumothorax. Normal heart size and
aortic contours. Remote lower thoracic compression fracture with
stable moderate height loss compared to 4109 study. Chronic and
stable symmetric biapical pleural scarring.
IMPRESSION: No evidence of acute disease.

## 2017-02-08 ENCOUNTER — Encounter (INDEPENDENT_AMBULATORY_CARE_PROVIDER_SITE_OTHER): Payer: PPO | Admitting: Ophthalmology

## 2017-02-08 DIAGNOSIS — I10 Essential (primary) hypertension: Secondary | ICD-10-CM

## 2017-02-08 DIAGNOSIS — H353231 Exudative age-related macular degeneration, bilateral, with active choroidal neovascularization: Secondary | ICD-10-CM | POA: Diagnosis not present

## 2017-02-08 DIAGNOSIS — H43813 Vitreous degeneration, bilateral: Secondary | ICD-10-CM

## 2017-02-08 DIAGNOSIS — H35033 Hypertensive retinopathy, bilateral: Secondary | ICD-10-CM | POA: Diagnosis not present

## 2017-04-18 ENCOUNTER — Emergency Department (HOSPITAL_COMMUNITY)
Admission: EM | Admit: 2017-04-18 | Discharge: 2017-04-18 | Disposition: A | Discharge status: Home / Self Care | Payer: PPO | Attending: Emergency Medicine | Admitting: Emergency Medicine

## 2017-04-18 ENCOUNTER — Other Ambulatory Visit: Payer: Self-pay

## 2017-04-18 ENCOUNTER — Encounter (HOSPITAL_COMMUNITY): Payer: Self-pay | Admitting: Emergency Medicine

## 2017-04-18 ENCOUNTER — Emergency Department (HOSPITAL_COMMUNITY): Payer: PPO

## 2017-04-18 DIAGNOSIS — I10 Essential (primary) hypertension: Secondary | ICD-10-CM | POA: Diagnosis not present

## 2017-04-18 DIAGNOSIS — S0240DA Maxillary fracture, left side, initial encounter for closed fracture: Secondary | ICD-10-CM | POA: Diagnosis not present

## 2017-04-18 DIAGNOSIS — Z79899 Other long term (current) drug therapy: Secondary | ICD-10-CM | POA: Diagnosis not present

## 2017-04-18 DIAGNOSIS — S02401A Maxillary fracture, unspecified, initial encounter for closed fracture: Secondary | ICD-10-CM

## 2017-04-18 DIAGNOSIS — F17210 Nicotine dependence, cigarettes, uncomplicated: Secondary | ICD-10-CM | POA: Diagnosis not present

## 2017-04-18 DIAGNOSIS — Y939 Activity, unspecified: Secondary | ICD-10-CM | POA: Diagnosis not present

## 2017-04-18 DIAGNOSIS — E119 Type 2 diabetes mellitus without complications: Secondary | ICD-10-CM | POA: Diagnosis not present

## 2017-04-18 DIAGNOSIS — Y999 Unspecified external cause status: Secondary | ICD-10-CM | POA: Insufficient documentation

## 2017-04-18 DIAGNOSIS — Y929 Unspecified place or not applicable: Secondary | ICD-10-CM | POA: Insufficient documentation

## 2017-04-18 DIAGNOSIS — W010XXA Fall on same level from slipping, tripping and stumbling without subsequent striking against object, initial encounter: Secondary | ICD-10-CM | POA: Diagnosis not present

## 2017-04-18 DIAGNOSIS — S0993XA Unspecified injury of face, initial encounter: Secondary | ICD-10-CM | POA: Diagnosis present

## 2017-04-18 DIAGNOSIS — S0240CA Maxillary fracture, right side, initial encounter for closed fracture: Secondary | ICD-10-CM | POA: Diagnosis not present

## 2017-04-18 MED ORDER — CLINDAMYCIN HCL 300 MG PO CAPS: 300.0000 mg | ORAL_CAPSULE | Freq: Four times a day (QID) | ORAL | 0 refills | Status: DC

## 2017-04-18 MED ORDER — TRAMADOL HCL 50 MG PO TABS: 50.0000 mg | ORAL_TABLET | Freq: Four times a day (QID) | ORAL | 0 refills | Status: DC | PRN

## 2017-04-18 NOTE — ED Notes (Signed)
Patient states he was carrying in groceries last pm loss his balance and fell landing on the left side of his face , states he heard something pop. This am had a blood clot in his left nostril , states he did not hit his nose. Ambulatory and states he feels better standing up. Patient is completely blind in his left eye and legally blind in his right.

## 2017-04-18 NOTE — ED Provider Notes (Signed)
[place] EMERGENCY DEPARTMENT Provider Note   CSN: [id number] Arrival date & time: 04/18/17  1287     History   Chief Complaint Chief Complaint  Patient presents with  . Fall  . Facial Pain    HPI Scott Willis is a 77 y.o. male.  HPI Patient states he tripped and fell landing on the left side of his face yesterday evening.  Thinks he heard a pop in his face.  Denies loss of consciousness.  Denies neck pain, focal weakness or numbness.  Complains only of left-sided facial pain.  He did have a blood clot in his nose this morning.  No active bleeding.  Patient is not on any blood thinner. Past Medical History:  Diagnosis Date  . Arthritis   . Benign essential hypertension   . Blepharitis, squamous    Right Eye   . Blindness   . BPH (benign prostatic hyperplasia)   . DM (diabetes mellitus) type II uncontrolled with eye manifestation (HCC)    fasting 110s  . Hyperlipidemia LDL goal < 100   . Kidney stones   . Low serum testosterone level   . Macular degeneration   . Osteoporosis due to androgen therapy   . Ulcer    gastric    Patient Active Problem List   Diagnosis Date Noted  . Syncope 04/23/2015  . Syncope and collapse 04/23/2015  . Depression 09/11/2013  . Unspecified hereditary and idiopathic peripheral neuropathy 08/29/2013  . Dysphagia, pharyngoesophageal phase 08/29/2013  . Rotator cuff tear 08/22/2013  . Left shoulder pain 07/31/2013  . Tobacco use disorder 04/19/2013  . Acute bronchitis 04/19/2013  . Influenza 04/11/2013  . Pallor 04/11/2013  . Gastric ulcer 04/11/2013  . Melena 04/11/2013  . Macular degeneration   . Benign essential hypertension   . Hyperlipidemia LDL goal < 100   . Osteoporosis due to androgen therapy   . Low serum testosterone level   . BPH (benign prostatic hyperplasia)   . DM (diabetes mellitus) type II uncontrolled with eye manifestation [facility])     Past Surgical History:  Procedure Laterality Date    . [place]  . BACK SURGERY  03/2006   Removed [place] Dr.Nitka   . CARPAL TUNNEL RELEASE  08/2006  . CARPAL TUNNEL RELEASE  08/2006   Dr.Nitka   . CATARACT EXTRACTION  02/21/2007   Dr.Epps   . EYE SURGERY  2013   Right eye every 8 weeks   . KIDNEY STONE SURGERY  08/11/2007   Dr.Tannenbaum   . LITHOTRIPSY  08/11/2007   Dr.Nesi  . REFRACTIVE SURGERY  09/2005   Macular Degeneration Dr.Mathews   . SHOULDER ARTHROSCOPY WITH SUBACROMIAL DECOMPRESSION, ROTATOR CUFF REPAIR AND BICEP TENDON REPAIR Left 08/22/2013   Procedure:  LEFT SHOULDER DIAGNOSTIC OPERATIVE ARTHROSCOPY ,EXTENSIVE DEBRIDEMENT, BICEPS RELEASE AND TENODESIS, ROTATOR CUFF REPAIR, SUBACROMIAL DECOMPRESSION;  Surgeon: Meredith Pel, MD;  Location: [place];  Service: Orthopedics;  Laterality: Left;  LEFT SHOULDER DOA,EXTENSIVE DEBRIDEMENT, BICEPS RELEASE AND TENODESIS, ROTATOR CUFF REPAIR, SUBACROMIAL DECOMPRESSION,       Home Medications    Prior to Admission medications   Medication Sig Start Date End Date Taking? Authorizing Provider  clindamycin (CLEOCIN) 300 MG capsule Take 1 capsule (300 mg total) by mouth 4 (four) times daily. X 7 days 04/18/17   Julianne Rice, MD  diazepam (VALIUM) 5 MG tablet Take 5 mg by mouth See admin instructions. 1 by mouth on the day of eye  surgery ONCE every 16 weeks  Dr.[name]    [provider]  finasteride (PROSCAR) 5 MG tablet Take 5 mg by mouth at bedtime.     [provider]  gabapentin (NEURONTIN) 600 MG tablet Take 600 mg by mouth 3 (three) times daily.    [provider]  HYDROcodone-acetaminophen (NORCO/VICODIN) 5-325 MG per tablet Take 1 tablet by mouth See admin instructions. Once every 16 weeks before eyes injections    [provider]  lidocaine (LIDODERM) 5 % Place 1 patch onto the skin daily as needed (PAIN). Remove & Discard patch within 12 hours or as directed by MD    [provider]  moxifloxacin (VIGAMOX)  0.5 % ophthalmic solution Place 1 drop into the right eye 3 (three) times daily. Every 16 weeks for 4 days after surgery    [provider]  pioglitazone (ACTOS) 30 MG tablet Take 30 mg by mouth at bedtime. Dr.[name]    [provider]  Polyethyl Glycol-Propyl Glycol (SYSTANE OP) Apply 1 drop to eye as needed (dry eyes).     [provider]  sitaGLIPtin (JANUVIA) 100 MG tablet Take 100 mg by mouth daily.     [provider]  tamsulosin (FLOMAX) 0.4 MG CAPS capsule Take 0.4 mg by mouth daily after supper.     [provider]  testosterone cypionate (DEPO-TESTOSTERONE) 100 MG/ML injection Inject 1 mL (100 mg total) into the muscle every 28 (twenty-eight) days. For IM use only and Generic is fine 11/22/13   Reed, Tiffany L, DO  traMADol (ULTRAM) 50 MG tablet Take 1 tablet (50 mg total) by mouth every 6 (six) hours as needed. 04/18/17   Julianne Rice, MD  venlafaxine XR (EFFEXOR-XR) 75 MG 24 hr capsule TAKE 1 CAPSULE (75 MG TOTAL) BY MOUTH DAILY WITH BREAKFAST. Patient taking differently: TAKE 1 CAPSULE (75 MG TOTAL) BY MOUTH DAILY WITH SUPPER 04/02/14   Gayland Curry, DO    Family History Family History  Problem Relation Age of Onset  . Alzheimer's disease Mother   . Colon cancer Father   . Diabetes Sister     Social History Social History   Tobacco Use  . Smoking status: Current Every Day Smoker    Packs/day: 1.50    Years: 59.00    Pack years: 88.50    Types: Cigarettes  . Smokeless tobacco: Never Used  Substance Use Topics  . Alcohol use: No    Alcohol/week: 0.0 oz  . Drug use: No     Allergies   Bee pollen; Tape; Flurox [fluorescein-benoxinate]; Metformin and related; Morphine and related; and Statins   Review of Systems Review of Systems  Constitutional: Negative for chills and fever.  HENT: Negative for facial swelling and trouble swallowing.   Eyes: Negative for visual disturbance.  Gastrointestinal: Negative for  nausea and vomiting.  Musculoskeletal: Negative for gait problem and neck pain.  Skin: Positive for wound.  Neurological: Negative for syncope, numbness and headaches.  All other systems reviewed and are negative.    Physical Exam Updated Vital Signs BP (!) 158/50 (BP Location: Right Arm)   Pulse 80   Temp 98.1 F (36.7 C) (Oral)   Resp 17   Ht 5' 10.5" (1.791 m)   Wt 77.1 kg (170 lb)   SpO2 100%   BMI 24.05 kg/m   Physical Exam  Constitutional: He is oriented to person, place, and time. He appears well-developed and well-nourished. No distress.  HENT:  Head: Normocephalic.  Mouth/Throat:  Oropharynx is clear and moist.  Patient with left lower facial abrasion.  Has some tenderness over the left maxilla.  There is no malocclusion.  No intraoral trauma.  Eyes: EOM are normal. Pupils are equal, round, and reactive to light.  Neck: Normal range of motion. Neck supple.  No posterior midline cervical tenderness to palpation.  Cardiovascular: Normal rate and regular rhythm.  Pulmonary/Chest: Effort normal and breath sounds normal.  Abdominal: Soft. Bowel sounds are normal. There is no tenderness. There is no rebound and no guarding.  Musculoskeletal: Normal range of motion. He exhibits no edema or tenderness.  No midline thoracic or lumbar tenderness.  Pelvis stable.  Neurological: He is alert and oriented to person, place, and time.  5/5 motor in all extremities.  Sensation fully intact.  Patient ambulate without any difficulty.  Skin: Skin is warm and dry. Capillary refill takes less than 2 seconds. No rash noted. He is not diaphoretic. No erythema.  Psychiatric: He has a normal mood and affect. His behavior is normal.  Nursing note and vitals reviewed.    ED Treatments / Results  Labs (all labs ordered are listed, but only abnormal results are displayed) Labs Reviewed - No data to display  EKG  EKG Interpretation None       Radiology Ct Maxillofacial Wo  Contrast  Result Date: 04/18/2017 CLINICAL DATA:  77 year old male with facial injury and pain following fall last night. Initial encounter. EXAM: CT MAXILLOFACIAL WITHOUT CONTRAST TECHNIQUE: Multidetector CT imaging of the maxillofacial structures was performed. Multiplanar CT image reconstructions were also generated. COMPARISON:  04/23/2015 head CT and 09/28/2006 facial CT. FINDINGS: Osseous: Remote left maxillary sinus fractures are again noted. However there are nondisplaced acute appearing fractures along the posterior lateral and anterior left maxillary sinus walls and appear different from 2008. No other acute fractures are identified. Orbits: No definite acute abnormality.  Globes are unremarkable. Sinuses: A small amount of fluid within the left maxillary sinus noted. The remainder the sinuses are clear. Soft tissues: There may be mild left facial soft tissue swelling noted. Limited intracranial: No acute abnormality. Cerebral atrophy identified. IMPRESSION: 1. Acute nondisplaced fractures of the posterolateral neck anterior left maxillary sinus walls. Small amount of fluid within the left maxillary sinus. Remote left maxillary sinuses fractures again noted. Electronically Signed   By: Margarette Canada M.D.   On: 04/18/2017 14:14    Procedures Procedures (including critical care time)  Medications Ordered in ED Medications - No data to display   Initial Impression / Assessment and Plan / ED Course  I have reviewed the triage vital signs and the nursing notes.  Pertinent labs & imaging results that were available during my care of the patient were reviewed by me and considered in my medical decision making (see chart for details).    Evidence of nondisplaced maxillary sinus fractures.  Start on antibiotics and have patient follow-up with ENT.  He is been given precautions about blowing his nose.  Also been given head injury precautions.  Continues to have a normal neurologic exam.   Final  Clinical Impressions(s) / ED Diagnoses   Final diagnoses:  Maxillary sinus fracture, closed, initial encounter [facility])    ED Discharge Orders        Ordered    clindamycin (CLEOCIN) 300 MG capsule  4 times daily     04/18/17 1439    traMADol (ULTRAM) 50 MG tablet  Every 6 hours PRN     04/18/17 1439  Julianne Rice, MD 04/18/17 1450

## 2017-04-18 NOTE — ED Notes (Signed)
Declined W/C at D/C and was escorted to lobby by RN. 

## 2017-04-18 NOTE — Discharge Instructions (Addendum)
Do not blow your nose.  Take antibiotic as prescribed for pain.  Follow-up with ear, nose and throat specialist.

## 2017-04-18 NOTE — ED Triage Notes (Signed)
Pt. Stated, I fell last night and hit my left cheek I even broke my glasses

## 2017-04-19 ENCOUNTER — Emergency Department (HOSPITAL_COMMUNITY)
Admission: EM | Admit: 2017-04-19 | Discharge: 2017-04-19 | Disposition: A | Discharge status: Home / Self Care | Payer: PPO | Attending: Emergency Medicine | Admitting: Emergency Medicine

## 2017-04-19 ENCOUNTER — Encounter (HOSPITAL_COMMUNITY): Payer: Self-pay

## 2017-04-19 ENCOUNTER — Other Ambulatory Visit: Payer: Self-pay

## 2017-04-19 DIAGNOSIS — R040 Epistaxis: Secondary | ICD-10-CM | POA: Diagnosis present

## 2017-04-19 DIAGNOSIS — S02401D Maxillary fracture, unspecified, subsequent encounter for fracture with routine healing: Secondary | ICD-10-CM

## 2017-04-19 DIAGNOSIS — S0240DD Maxillary fracture, left side, subsequent encounter for fracture with routine healing: Secondary | ICD-10-CM | POA: Diagnosis not present

## 2017-04-19 DIAGNOSIS — Z79899 Other long term (current) drug therapy: Secondary | ICD-10-CM | POA: Insufficient documentation

## 2017-04-19 DIAGNOSIS — F17210 Nicotine dependence, cigarettes, uncomplicated: Secondary | ICD-10-CM | POA: Diagnosis not present

## 2017-04-19 DIAGNOSIS — E119 Type 2 diabetes mellitus without complications: Secondary | ICD-10-CM | POA: Diagnosis not present

## 2017-04-19 DIAGNOSIS — W19XXXD Unspecified fall, subsequent encounter: Secondary | ICD-10-CM | POA: Insufficient documentation

## 2017-04-19 DIAGNOSIS — Z7984 Long term (current) use of oral hypoglycemic drugs: Secondary | ICD-10-CM | POA: Diagnosis not present

## 2017-04-19 DIAGNOSIS — J3489 Other specified disorders of nose and nasal sinuses: Secondary | ICD-10-CM | POA: Diagnosis not present

## 2017-04-19 NOTE — Discharge Instructions (Signed)
Your evaluation today is reassuring, continue to take antibiotics and pain medication as directed.  Follow up with Dr. Janeice Robinson office as planned.  If you have persistent bleeding from the nose, or significantly worsened pain return to the emergency department for reevaluation.  Continue to avoid nose blowing or sniffing as this can further displace your fractures.

## 2017-04-19 NOTE — ED Provider Notes (Signed)
[facility] EMERGENCY DEPARTMENT Provider Note   CSN: [id number] Arrival date & time: 04/19/17  1725     History   Chief Complaint Chief Complaint  Patient presents with  . Follow-up    HPI  Scott Willis is a 77 y.o. Male who presents for a recheck of left maxillary sinus fractures.  Patient was seen yesterday by Dr. Lita Mains, and diagnosed with 2 fractures of the left maxillary sinus, and told to follow-up with ENT.  Patient reports he called Dr. Janeice Robinson office today and they reported they would be able to see him in 1 week, patient reports some occasional bleeding noted in his nasal mucus, which he was concerned about so he return to the ED.  Patient denies any persistent nosebleeds.  Patient reports occasional twinges of pain, but no significantly worsened pain.  Patient was discharged with tramadol for pain which he has been taking.  He is also been taking his clindamycin as directed.  Patient denies any other new injuries or other complaints.      Past Medical History:  Diagnosis Date  . Arthritis   . Benign essential hypertension   . Blepharitis, squamous    Right Eye   . Blindness   . BPH (benign prostatic hyperplasia)   . DM (diabetes mellitus) type II uncontrolled with eye manifestation (HCC)    fasting 110s  . Hyperlipidemia LDL goal < 100   . Kidney stones   . Low serum testosterone level   . Macular degeneration   . Osteoporosis due to androgen therapy   . Ulcer    gastric    Patient Active Problem List   Diagnosis Date Noted  . Syncope 04/23/2015  . Syncope and collapse 04/23/2015  . Depression 09/11/2013  . Unspecified hereditary and idiopathic peripheral neuropathy 08/29/2013  . Dysphagia, pharyngoesophageal phase 08/29/2013  . Rotator cuff tear 08/22/2013  . Left shoulder pain 07/31/2013  . Tobacco use disorder 04/19/2013  . Acute bronchitis 04/19/2013  . Influenza 04/11/2013  . Pallor 04/11/2013  . Gastric ulcer  04/11/2013  . Melena 04/11/2013  . Macular degeneration   . Benign essential hypertension   . Hyperlipidemia LDL goal < 100   . Osteoporosis due to androgen therapy   . Low serum testosterone level   . BPH (benign prostatic hyperplasia)   . DM (diabetes mellitus) type II uncontrolled with eye manifestation [facility])     Past Surgical History:  Procedure Laterality Date  . [place]  . BACK SURGERY  03/2006   Removed [place] Dr.Nitka   . CARPAL TUNNEL RELEASE  08/2006  . CARPAL TUNNEL RELEASE  08/2006   Dr.Nitka   . CATARACT EXTRACTION  02/21/2007   Dr.Epps   . EYE SURGERY  2013   Right eye every 8 weeks   . KIDNEY STONE SURGERY  08/11/2007   Dr.Tannenbaum   . LITHOTRIPSY  08/11/2007   Dr.Nesi  . REFRACTIVE SURGERY  09/2005   Macular Degeneration Dr.Mathews   . SHOULDER ARTHROSCOPY WITH SUBACROMIAL DECOMPRESSION, ROTATOR CUFF REPAIR AND BICEP TENDON REPAIR Left 08/22/2013   Procedure:  LEFT SHOULDER DIAGNOSTIC OPERATIVE ARTHROSCOPY ,EXTENSIVE DEBRIDEMENT, BICEPS RELEASE AND TENODESIS, ROTATOR CUFF REPAIR, SUBACROMIAL DECOMPRESSION;  Surgeon: Meredith Pel, MD;  Location: [facility];  Service: Orthopedics;  Laterality: Left;  LEFT SHOULDER DOA,EXTENSIVE DEBRIDEMENT, BICEPS RELEASE AND TENODESIS, ROTATOR CUFF REPAIR, SUBACROMIAL DECOMPRESSION,       Home Medications    Prior to Admission medications  Medication Sig Start Date End Date Taking? Authorizing Provider  clindamycin (CLEOCIN) 300 MG capsule Take 1 capsule (300 mg total) by mouth 4 (four) times daily. X 7 days 04/18/17   Julianne Rice, MD  diazepam (VALIUM) 5 MG tablet Take 5 mg by mouth See admin instructions. 1 by mouth on the day of eye surgery ONCE every 16 weeks  Dr.[name]    [provider]  finasteride (PROSCAR) 5 MG tablet Take 5 mg by mouth at bedtime.     [provider]  gabapentin (NEURONTIN) 600 MG tablet Take 600 mg by mouth 3 (three) times daily.    [provider]  HYDROcodone-acetaminophen (NORCO/VICODIN) 5-325 MG per tablet Take 1 tablet by mouth See admin instructions. Once every 16 weeks before eyes injections    [provider]  lidocaine (LIDODERM) 5 % Place 1 patch onto the skin daily as needed (PAIN). Remove & Discard patch within 12 hours or as directed by MD    [provider]  moxifloxacin (VIGAMOX) 0.5 % ophthalmic solution Place 1 drop into the right eye 3 (three) times daily. Every 16 weeks for 4 days after surgery    [provider]  pioglitazone (ACTOS) 30 MG tablet Take 30 mg by mouth at bedtime. Dr.[name]    [provider]  Polyethyl Glycol-Propyl Glycol (SYSTANE OP) Apply 1 drop to eye as needed (dry eyes).     [provider]  sitaGLIPtin (JANUVIA) 100 MG tablet Take 100 mg by mouth daily.     [provider]  tamsulosin (FLOMAX) 0.4 MG CAPS capsule Take 0.4 mg by mouth daily after supper.     [provider]  testosterone cypionate (DEPO-TESTOSTERONE) 100 MG/ML injection Inject 1 mL (100 mg total) into the muscle every 28 (twenty-eight) days. For IM use only and Generic is fine 11/22/13   Reed, Tiffany L, DO  traMADol (ULTRAM) 50 MG tablet Take 1 tablet (50 mg total) by mouth every 6 (six) hours as needed. 04/18/17   Julianne Rice, MD  venlafaxine XR (EFFEXOR-XR) 75 MG 24 hr capsule TAKE 1 CAPSULE (75 MG TOTAL) BY MOUTH DAILY WITH BREAKFAST. Patient taking differently: TAKE 1 CAPSULE (75 MG TOTAL) BY MOUTH DAILY WITH SUPPER 04/02/14   Gayland Curry, DO    Family History Family History  Problem Relation Age of Onset  . Alzheimer's disease Mother   . Colon cancer Father   . Diabetes Sister     Social History Social History   Tobacco Use  . Smoking status: Current Every Day Smoker    Packs/day: 1.50    Years: 59.00    Pack years: 88.50    Types: Cigarettes  . Smokeless tobacco: Never Used  Substance Use Topics  . Alcohol use: No     Alcohol/week: 0.0 oz  . Drug use: No     Allergies   Bee pollen; Tape; Flurox [fluorescein-benoxinate]; Metformin and related; Morphine and related; and Statins   Review of Systems Review of Systems  Constitutional: Negative for chills and fever.  HENT: Positive for nosebleeds and rhinorrhea.   Eyes: Negative for pain and visual disturbance.  Neurological: Negative for headaches.     Physical Exam Updated Vital Signs BP (!) 154/80 (BP Location: Right Arm)   Pulse 75   Temp 98.8 F (37.1 C) (Oral)   Resp 16   SpO2 99%   Physical Exam  Constitutional: He appears well-developed and well-nourished. No distress.  HENT:  Head: Normocephalic.  Nose: No sinus tenderness or nasal septal hematoma. No epistaxis. Left sinus exhibits maxillary sinus tenderness.  Mouth/Throat: Uvula is midline and oropharynx is clear and moist.  Eyes: Right eye exhibits no discharge. Left eye exhibits no discharge.  Pulmonary/Chest: Effort normal. No respiratory distress.  Neurological: He is alert. Coordination normal.  Skin: Skin is warm and dry. He is not diaphoretic.  Psychiatric: He has a normal mood and affect. His behavior is normal.  Nursing note and vitals reviewed.    ED Treatments / Results  Labs (all labs ordered are listed, but only abnormal results are displayed) Labs Reviewed - No data to display  EKG  EKG Interpretation None       Radiology Ct Maxillofacial Wo Contrast  Result Date: 04/18/2017 CLINICAL DATA:  77 year old male with facial injury and pain following fall last night. Initial encounter. EXAM: CT MAXILLOFACIAL WITHOUT CONTRAST TECHNIQUE: Multidetector CT imaging of the maxillofacial structures was performed. Multiplanar CT image reconstructions were also generated. COMPARISON:  04/23/2015 head CT and 09/28/2006 facial CT. FINDINGS: Osseous: Remote left maxillary sinus fractures are again noted. However there are nondisplaced acute appearing fractures along the  posterior lateral and anterior left maxillary sinus walls and appear different from 2008. No other acute fractures are identified. Orbits: No definite acute abnormality.  Globes are unremarkable. Sinuses: A small amount of fluid within the left maxillary sinus noted. The remainder the sinuses are clear. Soft tissues: There may be mild left facial soft tissue swelling noted. Limited intracranial: No acute abnormality. Cerebral atrophy identified. IMPRESSION: 1. Acute nondisplaced fractures of the posterolateral neck anterior left maxillary sinus walls. Small amount of fluid within the left maxillary sinus. Remote left maxillary sinuses fractures again noted. Electronically Signed   By: Margarette Canada M.D.   On: 04/18/2017 14:14    Procedures Procedures (including critical care time)  Medications Ordered in ED Medications - No data to display   Initial Impression / Assessment and Plan / ED Course  I have reviewed the triage vital signs and the nursing notes.  Pertinent labs & imaging results that were available during my care of the patient were reviewed by me and considered in my medical decision making (see chart for details).  Pt presents for follow-up after being seen in ED yesterday and diagnosed with left maxillary sinus fractures after a fall. Pt concerned he would not be seen by ENT for a week. Pt reports occasional blood in nasal drainage, denies any persistent nosebleeds. Taking abx and pain meds as directed. On exam pt is well appearing, no active bleeding on exam, pt with mild tenderness of maxillary sinus. Had long discussion with pt providing reassurance and going over imaging studies, assured pt that small amounts of blood in nasal drainage is to be expected as well as some pain in the area, but if pt is not having persistent active bleeding for nose, or developing significantly worsening pain or fevers, then pt is stable to follow up outpt in one week. Counseled pt to continue to avoid nose  blowing. Pt expresses understanding and agrees with plan.   Patient discussed with Dr.Pickering, who saw patient as well and agrees with plan.   Final Clinical Impressions(s) / ED Diagnoses   Final diagnoses:  Closed fracture of maxillary sinus with routine healing, subsequent encounter    ED Discharge Orders    None       Jacqlyn Larsen, [place] 04/20/17 0244    Davonna Belling, MD 04/21/17 [phone]

## 2017-04-19 NOTE — ED Triage Notes (Signed)
Pt was seen yesterday for left cheek fractures and instructed to follow up with ENT asap. Pt reports he tried to call ENT today and was told they wouldn't be able to see him for over a week. Pt states his dc papers told him to follow up at ED if unable to see ENT.

## 2017-04-20 ENCOUNTER — Other Ambulatory Visit: Payer: Self-pay

## 2017-04-20 NOTE — Patient Outreach (Signed)
Possible duplicate note. CMA spoke with patient in regards to recent ED visit. Patient states visit was due to fall on 04/18/17 and returned on 04/19/17 due to ENT concern. Patient was unable to complete patient engagement tool due to other obligations but will follow up once [facility] information is received.

## 2017-04-20 NOTE — Patient Outreach (Signed)
Spoke w/ patient in regards to recent ED visit on 04/18/17. Patient stated visit was do to fall. 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 Patient states he will follow up when able based on successful letter received.

## 2017-04-21 DIAGNOSIS — S0240DD Maxillary fracture, left side, subsequent encounter for fracture with routine healing: Secondary | ICD-10-CM | POA: Diagnosis not present

## 2017-04-23 ENCOUNTER — Encounter (INDEPENDENT_AMBULATORY_CARE_PROVIDER_SITE_OTHER): Payer: PPO | Admitting: Ophthalmology

## 2017-04-23 DIAGNOSIS — H35033 Hypertensive retinopathy, bilateral: Secondary | ICD-10-CM

## 2017-04-23 DIAGNOSIS — I10 Essential (primary) hypertension: Secondary | ICD-10-CM

## 2017-04-23 DIAGNOSIS — H43813 Vitreous degeneration, bilateral: Secondary | ICD-10-CM | POA: Diagnosis not present

## 2017-04-23 DIAGNOSIS — H353231 Exudative age-related macular degeneration, bilateral, with active choroidal neovascularization: Secondary | ICD-10-CM | POA: Diagnosis not present

## 2017-04-26 ENCOUNTER — Encounter (INDEPENDENT_AMBULATORY_CARE_PROVIDER_SITE_OTHER): Payer: PPO | Admitting: Ophthalmology

## 2017-07-02 DIAGNOSIS — Z8711 Personal history of peptic ulcer disease: Secondary | ICD-10-CM | POA: Diagnosis not present

## 2017-07-02 DIAGNOSIS — I119 Hypertensive heart disease without heart failure: Secondary | ICD-10-CM | POA: Diagnosis not present

## 2017-07-09 ENCOUNTER — Encounter (INDEPENDENT_AMBULATORY_CARE_PROVIDER_SITE_OTHER): Payer: PPO | Admitting: Ophthalmology

## 2017-07-09 DIAGNOSIS — I10 Essential (primary) hypertension: Secondary | ICD-10-CM | POA: Diagnosis not present

## 2017-07-09 DIAGNOSIS — H35033 Hypertensive retinopathy, bilateral: Secondary | ICD-10-CM | POA: Diagnosis not present

## 2017-07-09 DIAGNOSIS — H43813 Vitreous degeneration, bilateral: Secondary | ICD-10-CM

## 2017-07-09 DIAGNOSIS — H353231 Exudative age-related macular degeneration, bilateral, with active choroidal neovascularization: Secondary | ICD-10-CM | POA: Diagnosis not present

## 2017-08-24 DIAGNOSIS — I70219 Atherosclerosis of native arteries of extremities with intermittent claudication, unspecified extremity: Secondary | ICD-10-CM | POA: Diagnosis not present

## 2017-08-24 DIAGNOSIS — Z1389 Encounter for screening for other disorder: Secondary | ICD-10-CM | POA: Diagnosis not present

## 2017-08-24 DIAGNOSIS — H9113 Presbycusis, bilateral: Secondary | ICD-10-CM | POA: Diagnosis not present

## 2017-08-24 DIAGNOSIS — R1032 Left lower quadrant pain: Secondary | ICD-10-CM | POA: Diagnosis not present

## 2017-08-24 DIAGNOSIS — E1165 Type 2 diabetes mellitus with hyperglycemia: Secondary | ICD-10-CM | POA: Diagnosis not present

## 2017-08-24 DIAGNOSIS — Z87891 Personal history of nicotine dependence: Secondary | ICD-10-CM | POA: Diagnosis not present

## 2017-08-24 DIAGNOSIS — R42 Dizziness and giddiness: Secondary | ICD-10-CM | POA: Diagnosis not present

## 2017-08-24 DIAGNOSIS — Z1331 Encounter for screening for depression: Secondary | ICD-10-CM | POA: Diagnosis not present

## 2017-08-24 DIAGNOSIS — R002 Palpitations: Secondary | ICD-10-CM | POA: Diagnosis not present

## 2017-08-24 DIAGNOSIS — Z1211 Encounter for screening for malignant neoplasm of colon: Secondary | ICD-10-CM | POA: Diagnosis not present

## 2017-08-24 DIAGNOSIS — Z0000 Encounter for general adult medical examination without abnormal findings: Secondary | ICD-10-CM | POA: Diagnosis not present

## 2017-08-24 DIAGNOSIS — E785 Hyperlipidemia, unspecified: Secondary | ICD-10-CM | POA: Diagnosis not present

## 2017-09-02 DIAGNOSIS — R5383 Other fatigue: Secondary | ICD-10-CM | POA: Diagnosis not present

## 2017-09-02 DIAGNOSIS — Z125 Encounter for screening for malignant neoplasm of prostate: Secondary | ICD-10-CM | POA: Diagnosis not present

## 2017-09-02 DIAGNOSIS — E7800 Pure hypercholesterolemia, unspecified: Secondary | ICD-10-CM | POA: Diagnosis not present

## 2017-09-02 DIAGNOSIS — Z79899 Other long term (current) drug therapy: Secondary | ICD-10-CM | POA: Diagnosis not present

## 2017-09-02 DIAGNOSIS — E039 Hypothyroidism, unspecified: Secondary | ICD-10-CM | POA: Diagnosis not present

## 2017-09-02 DIAGNOSIS — Z131 Encounter for screening for diabetes mellitus: Secondary | ICD-10-CM | POA: Diagnosis not present

## 2017-09-10 ENCOUNTER — Encounter (INDEPENDENT_AMBULATORY_CARE_PROVIDER_SITE_OTHER): Payer: PPO | Admitting: Ophthalmology

## 2017-09-10 DIAGNOSIS — H353231 Exudative age-related macular degeneration, bilateral, with active choroidal neovascularization: Secondary | ICD-10-CM

## 2017-09-10 DIAGNOSIS — H43813 Vitreous degeneration, bilateral: Secondary | ICD-10-CM | POA: Diagnosis not present

## 2017-09-10 DIAGNOSIS — H35033 Hypertensive retinopathy, bilateral: Secondary | ICD-10-CM | POA: Diagnosis not present

## 2017-09-10 DIAGNOSIS — I10 Essential (primary) hypertension: Secondary | ICD-10-CM

## 2017-09-20 ENCOUNTER — Ambulatory Visit: Payer: PPO | Admitting: Orthotics

## 2017-09-20 ENCOUNTER — Ambulatory Visit: Payer: PPO | Admitting: Podiatry

## 2017-09-20 DIAGNOSIS — B351 Tinea unguium: Secondary | ICD-10-CM | POA: Diagnosis not present

## 2017-09-20 DIAGNOSIS — E0843 Diabetes mellitus due to underlying condition with diabetic autonomic (poly)neuropathy: Secondary | ICD-10-CM

## 2017-09-20 DIAGNOSIS — M79676 Pain in unspecified toe(s): Principal | ICD-10-CM

## 2017-09-20 DIAGNOSIS — M2142 Flat foot [pes planus] (acquired), left foot: Secondary | ICD-10-CM

## 2017-09-20 DIAGNOSIS — M2141 Flat foot [pes planus] (acquired), right foot: Secondary | ICD-10-CM

## 2017-09-20 NOTE — Progress Notes (Signed)
Patient here to get DBS measured/fitted; chose [phone]

## 2017-09-21 NOTE — Progress Notes (Signed)
   SUBJECTIVE Patient with a history of diabetes mellitus presents to office today complaining of elongated, thickened nails that cause pain while ambulating in shoes. He is unable to trim his own nails. Patient is here for further evaluation and treatment.   Past Medical History:  Diagnosis Date  . Arthritis   . Benign essential hypertension   . Blepharitis, squamous    Right Eye   . Blindness   . BPH (benign prostatic hyperplasia)   . DM (diabetes mellitus) type II uncontrolled with eye manifestation (HCC)    fasting 110s  . Hyperlipidemia LDL goal < 100   . Kidney stones   . Low serum testosterone level   . Macular degeneration   . Osteoporosis due to androgen therapy   . Ulcer    gastric    OBJECTIVE General Patient is awake, alert, and oriented x 3 and in no acute distress. Derm Skin is dry and supple bilateral. Negative open lesions or macerations. Remaining integument unremarkable. Nails are tender, long, thickened and dystrophic with subungual debris, consistent with onychomycosis, 1-5 bilateral. No signs of infection noted. Vasc  DP and PT pedal pulses palpable bilaterally. Temperature gradient within normal limits.  Neuro Epicritic and protective threshold sensation diminished bilaterally.  Musculoskeletal Exam Upon weightbearing there is a medial longitudinal arch collapse bilaterally. Remove foot valgus noted to the bilateral lower extremities with excessive pronation upon mid stance. No symptomatic pedal deformities noted bilateral. Muscular strength within normal limits.  ASSESSMENT 1. Diabetes Mellitus w/ peripheral neuropathy 2. Onychomycosis of nail due to dermatophyte bilateral 3. Pes planus bilateral   PLAN OF CARE 1. Patient evaluated today. 2. Instructed to maintain good pedal hygiene and foot care. Stressed importance of controlling blood sugar.  3. Mechanical debridement of nails 1-5 bilaterally performed using a nail nipper. Filed with dremel without  incident.  4. Fitted today for DM insoles and shoes with Liliane Channel, Pedorthist.  5. Return to clinic in 3 mos.     Edrick Kins, DPM [facility]  Dr. Edrick Kins, [address]                                        [place], [place] [address]                Office [phone]  Fax [phone]

## 2017-10-26 ENCOUNTER — Telehealth: Payer: Self-pay | Admitting: Podiatry

## 2017-10-26 NOTE — Telephone Encounter (Signed)
Pt called back and stated he called his pcp and they are saying that he has to have an appt with them for them to evaluate the pt.   Pt is upset and asking for something in writing where Liliane Channel discussed about his pcp not being able to charge pt an office visit to get paperwork signed. If you could fax it to him @ [phone]

## 2017-10-26 NOTE — Telephone Encounter (Signed)
Pt left message asking about the status of his diabetic shoes.   I returned call and explained we did get HTA authorization and now we are waiting on his provider (PCP) to sign off on the paperwork so that [employer] can start the order. Pt stated he was calling Dr Mancel Bale when he got off the phone.

## 2017-11-01 DIAGNOSIS — E1165 Type 2 diabetes mellitus with hyperglycemia: Secondary | ICD-10-CM | POA: Diagnosis not present

## 2017-11-02 ENCOUNTER — Telehealth: Payer: Self-pay | Admitting: Podiatry

## 2017-11-02 NOTE — Telephone Encounter (Signed)
Ma Hillock. [phone]. What button do I push to get a human being?

## 2017-11-05 ENCOUNTER — Encounter (INDEPENDENT_AMBULATORY_CARE_PROVIDER_SITE_OTHER): Payer: PPO | Admitting: Ophthalmology

## 2017-11-05 DIAGNOSIS — H353231 Exudative age-related macular degeneration, bilateral, with active choroidal neovascularization: Secondary | ICD-10-CM

## 2017-11-05 DIAGNOSIS — H35033 Hypertensive retinopathy, bilateral: Secondary | ICD-10-CM

## 2017-11-05 DIAGNOSIS — I10 Essential (primary) hypertension: Secondary | ICD-10-CM | POA: Diagnosis not present

## 2017-11-05 DIAGNOSIS — H43813 Vitreous degeneration, bilateral: Secondary | ICD-10-CM

## 2017-11-22 ENCOUNTER — Ambulatory Visit (INDEPENDENT_AMBULATORY_CARE_PROVIDER_SITE_OTHER): Payer: PPO | Admitting: Orthotics

## 2017-11-22 DIAGNOSIS — M79676 Pain in unspecified toe(s): Secondary | ICD-10-CM

## 2017-11-22 DIAGNOSIS — M2141 Flat foot [pes planus] (acquired), right foot: Secondary | ICD-10-CM | POA: Diagnosis not present

## 2017-11-22 DIAGNOSIS — E0843 Diabetes mellitus due to underlying condition with diabetic autonomic (poly)neuropathy: Secondary | ICD-10-CM

## 2017-11-22 DIAGNOSIS — M2142 Flat foot [pes planus] (acquired), left foot: Secondary | ICD-10-CM

## 2017-11-22 DIAGNOSIS — B351 Tinea unguium: Secondary | ICD-10-CM

## 2017-11-23 NOTE — Progress Notes (Signed)

## 2017-12-22 ENCOUNTER — Encounter: Payer: Self-pay | Admitting: Podiatry

## 2017-12-22 ENCOUNTER — Ambulatory Visit: Payer: PPO | Admitting: Podiatry

## 2017-12-22 DIAGNOSIS — E0843 Diabetes mellitus due to underlying condition with diabetic autonomic (poly)neuropathy: Secondary | ICD-10-CM | POA: Diagnosis not present

## 2017-12-22 DIAGNOSIS — B351 Tinea unguium: Secondary | ICD-10-CM

## 2017-12-22 DIAGNOSIS — M79676 Pain in unspecified toe(s): Secondary | ICD-10-CM

## 2017-12-22 NOTE — Progress Notes (Signed)
Complaint:  Visit Type: Patient returns to my office for continued preventative foot care services. Complaint: Patient states" my nails have grown long and thick and become painful to walk and wear shoes" Patient has been diagnosed with DM with neuropathy. The patient presents for preventative foot care services. No changes to ROS  Podiatric Exam: Vascular: dorsalis pedis and posterior tibial pulses are palpable bilateral. Capillary return is immediate. Temperature gradient is WNL. Skin turgor WNL  Sensorium: Diminished/absent Semmes Weinstein monofilament test. Normal tactile sensation bilaterally. Nail Exam: Pt has thick disfigured discolored nails with subungual debris noted bilateral entire nail hallux through fifth toenails Ulcer Exam: There is no evidence of ulcer or pre-ulcerative changes or infection. Orthopedic Exam: Muscle tone and strength are WNL. No limitations in general ROM. No crepitus or effusions noted. Foot type and digits show no abnormalities. Bony prominences are unremarkable. Skin: No Porokeratosis. No infection or ulcers  Diagnosis:  Onychomycosis, , Pain in right toe, pain in left toes,  Diabetes with neuropathy Treatment & Plan Procedures and Treatment: Consent by patient was obtained for treatment procedures.   Debridement of mycotic and hypertrophic toenails, 1 through 5 bilateral and clearing of subungual debris. No ulceration, no infection noted.  Return Visit-Office Procedure: Patient instructed to return to the office for a follow up visit 3 months for continued evaluation and treatment.    Jimya Ciani DPM 

## 2018-01-05 DIAGNOSIS — Z1211 Encounter for screening for malignant neoplasm of colon: Secondary | ICD-10-CM | POA: Diagnosis not present

## 2018-01-05 DIAGNOSIS — H3530 Unspecified macular degeneration: Secondary | ICD-10-CM | POA: Diagnosis not present

## 2018-01-05 DIAGNOSIS — F331 Major depressive disorder, recurrent, moderate: Secondary | ICD-10-CM | POA: Diagnosis not present

## 2018-01-05 DIAGNOSIS — Z23 Encounter for immunization: Secondary | ICD-10-CM | POA: Diagnosis not present

## 2018-01-05 DIAGNOSIS — E1165 Type 2 diabetes mellitus with hyperglycemia: Secondary | ICD-10-CM | POA: Diagnosis not present

## 2018-01-05 DIAGNOSIS — N400 Enlarged prostate without lower urinary tract symptoms: Secondary | ICD-10-CM | POA: Diagnosis not present

## 2018-01-05 DIAGNOSIS — N183 Chronic kidney disease, stage 3 (moderate): Secondary | ICD-10-CM | POA: Diagnosis not present

## 2018-01-05 DIAGNOSIS — Z9889 Other specified postprocedural states: Secondary | ICD-10-CM | POA: Diagnosis not present

## 2018-01-05 DIAGNOSIS — E785 Hyperlipidemia, unspecified: Secondary | ICD-10-CM | POA: Diagnosis not present

## 2018-01-05 DIAGNOSIS — Z720 Tobacco use: Secondary | ICD-10-CM | POA: Diagnosis not present

## 2018-01-07 ENCOUNTER — Encounter (INDEPENDENT_AMBULATORY_CARE_PROVIDER_SITE_OTHER): Payer: PPO | Admitting: Ophthalmology

## 2018-01-07 DIAGNOSIS — H35033 Hypertensive retinopathy, bilateral: Secondary | ICD-10-CM | POA: Diagnosis not present

## 2018-01-07 DIAGNOSIS — H353231 Exudative age-related macular degeneration, bilateral, with active choroidal neovascularization: Secondary | ICD-10-CM | POA: Diagnosis not present

## 2018-01-07 DIAGNOSIS — I10 Essential (primary) hypertension: Secondary | ICD-10-CM | POA: Diagnosis not present

## 2018-01-07 DIAGNOSIS — H43813 Vitreous degeneration, bilateral: Secondary | ICD-10-CM | POA: Diagnosis not present

## 2018-02-16 ENCOUNTER — Other Ambulatory Visit: Payer: Self-pay

## 2018-03-18 ENCOUNTER — Encounter (INDEPENDENT_AMBULATORY_CARE_PROVIDER_SITE_OTHER): Payer: PPO | Admitting: Ophthalmology

## 2018-03-18 DIAGNOSIS — I10 Essential (primary) hypertension: Secondary | ICD-10-CM

## 2018-03-18 DIAGNOSIS — H353231 Exudative age-related macular degeneration, bilateral, with active choroidal neovascularization: Secondary | ICD-10-CM

## 2018-03-18 DIAGNOSIS — H43813 Vitreous degeneration, bilateral: Secondary | ICD-10-CM | POA: Diagnosis not present

## 2018-03-18 DIAGNOSIS — H35033 Hypertensive retinopathy, bilateral: Secondary | ICD-10-CM

## 2018-03-23 ENCOUNTER — Encounter: Payer: Self-pay | Admitting: Podiatry

## 2018-03-23 ENCOUNTER — Ambulatory Visit: Payer: PPO | Admitting: Podiatry

## 2018-03-23 DIAGNOSIS — E0843 Diabetes mellitus due to underlying condition with diabetic autonomic (poly)neuropathy: Secondary | ICD-10-CM | POA: Diagnosis not present

## 2018-03-23 DIAGNOSIS — B351 Tinea unguium: Secondary | ICD-10-CM | POA: Diagnosis not present

## 2018-03-23 DIAGNOSIS — M79676 Pain in unspecified toe(s): Secondary | ICD-10-CM

## 2018-03-23 NOTE — Progress Notes (Signed)
Complaint:  Visit Type: Patient returns to my office for continued preventative foot care services. Complaint: Patient states" my nails have grown long and thick and become painful to walk and wear shoes" Patient has been diagnosed with DM with neuropathy. The patient presents for preventative foot care services. No changes to ROS  Podiatric Exam: Vascular: dorsalis pedis and posterior tibial pulses are palpable bilateral. Capillary return is immediate. Temperature gradient is WNL. Skin turgor WNL  Sensorium: Diminished/absent Semmes Weinstein monofilament test. Normal tactile sensation bilaterally. Nail Exam: Pt has thick disfigured discolored nails with subungual debris noted bilateral entire nail hallux through fifth toenails Ulcer Exam: There is no evidence of ulcer or pre-ulcerative changes or infection. Orthopedic Exam: Muscle tone and strength are WNL. No limitations in general ROM. No crepitus or effusions noted. Foot type and digits show no abnormalities. Bony prominences are unremarkable. Skin: No Porokeratosis. No infection or ulcers  Diagnosis:  Onychomycosis, , Pain in right toe, pain in left toes,  Diabetes with neuropathy Treatment & Plan Procedures and Treatment: Consent by patient was obtained for treatment procedures.   Debridement of mycotic and hypertrophic toenails, 1 through 5 bilateral and clearing of subungual debris. No ulceration, no infection noted.  Return Visit-Office Procedure: Patient instructed to return to the office for a follow up visit 3 months for continued evaluation and treatment.    Gardiner Barefoot DPM

## 2018-04-01 ENCOUNTER — Encounter (INDEPENDENT_AMBULATORY_CARE_PROVIDER_SITE_OTHER): Payer: Self-pay | Admitting: Family Medicine

## 2018-04-01 ENCOUNTER — Ambulatory Visit (INDEPENDENT_AMBULATORY_CARE_PROVIDER_SITE_OTHER): Payer: PPO | Admitting: Family Medicine

## 2018-04-01 VITALS — BP 203/88 | HR 88 | Temp 97.7°F | Resp 16 | Ht 68.75 in | Wt 154.8 lb

## 2018-04-01 DIAGNOSIS — F17200 Nicotine dependence, unspecified, uncomplicated: Secondary | ICD-10-CM

## 2018-04-01 DIAGNOSIS — H3530 Unspecified macular degeneration: Secondary | ICD-10-CM

## 2018-04-01 DIAGNOSIS — E1139 Type 2 diabetes mellitus with other diabetic ophthalmic complication: Secondary | ICD-10-CM | POA: Diagnosis not present

## 2018-04-01 DIAGNOSIS — R7989 Other specified abnormal findings of blood chemistry: Secondary | ICD-10-CM

## 2018-04-01 DIAGNOSIS — IMO0002 Reserved for concepts with insufficient information to code with codable children: Secondary | ICD-10-CM

## 2018-04-01 DIAGNOSIS — D649 Anemia, unspecified: Secondary | ICD-10-CM

## 2018-04-01 DIAGNOSIS — I10 Essential (primary) hypertension: Secondary | ICD-10-CM

## 2018-04-01 DIAGNOSIS — E1165 Type 2 diabetes mellitus with hyperglycemia: Secondary | ICD-10-CM

## 2018-04-01 DIAGNOSIS — J3489 Other specified disorders of nose and nasal sinuses: Secondary | ICD-10-CM

## 2018-04-01 DIAGNOSIS — G609 Hereditary and idiopathic neuropathy, unspecified: Secondary | ICD-10-CM | POA: Diagnosis not present

## 2018-04-01 DIAGNOSIS — F32A Depression, unspecified: Secondary | ICD-10-CM

## 2018-04-01 DIAGNOSIS — R634 Abnormal weight loss: Secondary | ICD-10-CM

## 2018-04-01 DIAGNOSIS — I499 Cardiac arrhythmia, unspecified: Secondary | ICD-10-CM | POA: Diagnosis not present

## 2018-04-01 DIAGNOSIS — N400 Enlarged prostate without lower urinary tract symptoms: Secondary | ICD-10-CM

## 2018-04-01 DIAGNOSIS — E785 Hyperlipidemia, unspecified: Secondary | ICD-10-CM

## 2018-04-01 DIAGNOSIS — F329 Major depressive disorder, single episode, unspecified: Secondary | ICD-10-CM

## 2018-04-01 NOTE — Progress Notes (Signed)
Office Visit Note   Patient: Scott Willis           Date of Birth: [birth year]           MRN: [id number] Visit Date: 04/01/2018 Requested by: No referring provider defined for this encounter. PCP: Patient, No Pcp Per  Subjective: Chief Complaint  Patient presents with  . reestablish primary care    HPI: He is a 77 year old here to reestablish care.  He has multiple issues to discuss today.  He has last in his estimate about 30 pounds in the past several months.  He has no appetite.  No fevers, chills, night sweats.  He does not know why he is losing weight.  He has tachycardia and palpitations today without chest pain.  He thinks it is from a confrontation he had had his work prior to coming to this appointment.  He was diagnosed with atrial fibrillation in the past and was managed by Dr. [name].  He never required cardioversion.  He states that usually such episodes spontaneously subside.  His last PCP told him he had a severe abnormality with his kidneys recently.  He does not know the nature of the abnormality and we do not have lab results to review.  In addition he was told he has anemia which is a new diagnosis for him.  His blood pressure is significantly elevated today.  Denies any headaches.  He thinks it is due to the confrontation he had earlier.  He states that his blood pressure has been well controlled.  From a diabetes standpoint his last A1c was right around 7.  Prior to that it was below 6.  He has had to change medications for various reasons.  He has been getting his nails trimmed by podiatry.  He goes to his eye doctor on a regular basis due to macular degeneration.  1 year ago he fell and fractured his left maxilla.  He has had chronic sinus drainage since then.  He was evaluated in the ER and referred to ENT.  Patient states that the ENT told him that this problem needs to be addressed by an eye specialist.  I do not see any mention of that in the ENT consult  note.  Patient states that he continues to have drainage on a daily basis but he does not have the finances right now to go see another specialist.  He wanted me to be aware of the problem.  BPH symptoms are manageable.  He is followed by urology yearly.  He continues to smoke cigarettes and does not plan to quit.               ROS: Otherwise noncontributory  Objective: Vital Signs: BP (!) 203/88 (BP Location: Left Arm, Patient Position: Sitting, Cuff Size: Normal)   Pulse 88   Temp 97.7 F (36.5 C)   Resp 16   Ht 5' 8.75" (1.746 m)   Wt 154 lb 12.8 oz (70.2 kg)   SpO2 100%   BMI 23.03 kg/m   Physical Exam:  HEENT:  [place]/AT, PERRLA, EOM Full, no nystagmus.  Funduscopic examination within normal limits.  No conjunctival erythema.  Tympanic membranes are pearly gray with normal landmarks.  External ear canals are normal.  Nasal passages are clear.  Oropharynx is clear.  No significant lymphadenopathy.  No thyromegaly or nodules.  2+ carotid pulses without bruits. Lungs: Clear to auscultation throughout with no wheezing. Heart: Irregularly irregular without murmurs, rubs, or gallops. Abdomen:  No hepatosplenomegaly, nontender. Extremities: 2+ radial and 1+ posterior tibial pulses bilaterally.  No peripheral edema. Feet: Decreased sensation in all areas to monofilament testing.  No hypertrophic calluses or skin lesions.    Imaging: None today.  Assessment & Plan: 1.  Unintentional weight loss, etiology uncertain.  Potentially related to thyroid dysfunction.  Cannot rule out neoplasm. -Labs to evaluate.  2.  Irregular heart rate, history of atrial fibrillation -If symptoms persist he will notify me and we will refer to cardiology.  He will go to the ER if he develops chest pain, shortness of breath, etc.  3.  Anemia, etiology uncertain. -Additional labs to investigate.  4.  Diabetes -A1c in a few months.  5.  Hypertension -Continue with current medication regimen.  Monitor  closely.  6.  Chronic left-sided sinus drainage -I believe ENT would be the most appropriate referral but could consider plastic surgery as well.  Patient wants to wait for now.  7.  Hyperlipidemia, BPH, and multiple other problems. -We will monitor periodically.   Follow-Up Instructions: No follow-ups on file.      Procedures: No procedures performed  No notes on file    PMFS History: Patient Active Problem List   Diagnosis Date Noted  . Closed fracture of left side of maxilla with routine healing 04/21/2017  . Syncope 04/23/2015  . Syncope and collapse 04/23/2015  . Depression 09/11/2013  . Hereditary and idiopathic peripheral neuropathy 08/29/2013  . Dysphagia, pharyngoesophageal phase 08/29/2013  . Rotator cuff tear 08/22/2013  . Left shoulder pain 07/31/2013  . Tobacco use disorder 04/19/2013  . Acute bronchitis 04/19/2013  . Influenza 04/11/2013  . Pallor 04/11/2013  . Gastric ulcer 04/11/2013  . Melena 04/11/2013  . Macular degeneration   . Benign essential hypertension   . Hyperlipidemia with target low density lipoprotein (LDL) cholesterol less than 100 mg/dL   . Osteoporosis due to androgen therapy   . Low serum testosterone level   . BPH (benign prostatic hyperplasia)   . DM (diabetes mellitus) type II uncontrolled with eye manifestation ([place])    Past Medical History:  Diagnosis Date  . Arthritis   . Benign essential hypertension   . Blepharitis, squamous    Right Eye   . Blindness   . BPH (benign prostatic hyperplasia)   . DM (diabetes mellitus) type II uncontrolled with eye manifestation ([facility])    fasting 110s  . Hyperlipidemia LDL goal < 100   . Kidney stones   . Low serum testosterone level   . Macular degeneration   . Osteoporosis due to androgen therapy   . Ulcer    gastric    Family History  Problem Relation Age of Onset  . Alzheimer's disease Mother   . Colon cancer Father   . Diabetes Sister     Past Surgical History:  Procedure  Laterality Date  . [place]  . BACK SURGERY  03/2006   Removed [place] Dr.Nitka   . CARPAL TUNNEL RELEASE  08/2006  . CARPAL TUNNEL RELEASE  08/2006   Dr.Nitka   . CATARACT EXTRACTION  02/21/2007   Dr.Epps   . EYE SURGERY  2013   Right eye every 8 weeks   . KIDNEY STONE SURGERY  08/11/2007   Dr.Tannenbaum   . LITHOTRIPSY  08/11/2007   Dr.Nesi  . REFRACTIVE SURGERY  09/2005   Macular Degeneration Dr.Mathews   . SHOULDER ARTHROSCOPY WITH SUBACROMIAL DECOMPRESSION, ROTATOR CUFF REPAIR AND BICEP TENDON REPAIR Left 08/22/2013  Procedure:  LEFT SHOULDER DIAGNOSTIC OPERATIVE ARTHROSCOPY ,EXTENSIVE DEBRIDEMENT, BICEPS RELEASE AND TENODESIS, ROTATOR CUFF REPAIR, SUBACROMIAL DECOMPRESSION;  Surgeon: Meredith Pel, MD;  Location: [facility];  Service: Orthopedics;  Laterality: Left;  LEFT SHOULDER DOA,EXTENSIVE DEBRIDEMENT, BICEPS RELEASE AND TENODESIS, ROTATOR CUFF REPAIR, SUBACROMIAL DECOMPRESSION,   Social History   Occupational History    Employer: [employer]    Comment: owner of printing company--[address]  Tobacco Use  . Smoking status: Current Every Day Smoker    Packs/day: 1.50    Years: 59.00    Pack years: 88.50    Types: Cigarettes  . Smokeless tobacco: Never Used  Substance and Sexual Activity  . Alcohol use: No    Alcohol/week: 0.0 standard drinks  . Drug use: No  . Sexual activity: Not on file

## 2018-04-02 ENCOUNTER — Telehealth (INDEPENDENT_AMBULATORY_CARE_PROVIDER_SITE_OTHER): Payer: Self-pay | Admitting: Family Medicine

## 2018-04-02 DIAGNOSIS — D649 Anemia, unspecified: Secondary | ICD-10-CM

## 2018-04-02 DIAGNOSIS — E559 Vitamin D deficiency, unspecified: Secondary | ICD-10-CM

## 2018-04-02 DIAGNOSIS — E538 Deficiency of other specified B group vitamins: Secondary | ICD-10-CM

## 2018-04-02 LAB — CBC WITH DIFFERENTIAL/PLATELET
Absolute Monocytes: 575 cells/uL (ref 200–950)
Basophils Absolute: 71 cells/uL (ref 0–200)
Basophils Relative: 1 %
Eosinophils Absolute: 391 cells/uL (ref 15–500)
Eosinophils Relative: 5.5 %
HCT: 37.3 % — ABNORMAL LOW (ref 38.5–50.0)
Hemoglobin: 12.5 g/dL — ABNORMAL LOW (ref 13.2–17.1)
Lymphs Abs: 1541 cells/uL (ref 850–3900)
MCH: 30.3 pg (ref 27.0–33.0)
MCHC: 33.5 g/dL (ref 32.0–36.0)
MCV: 90.5 fL (ref 80.0–100.0)
MPV: 12.1 fL (ref 7.5–12.5)
Monocytes Relative: 8.1 %
Neutro Abs: 4523 cells/uL (ref 1500–7800)
Neutrophils Relative %: 63.7 %
Platelets: 183 10*3/uL (ref 140–400)
RBC: 4.12 10*6/uL — ABNORMAL LOW (ref 4.20–5.80)
RDW: 12.7 % (ref 11.0–15.0)
Total Lymphocyte: 21.7 %
WBC: 7.1 10*3/uL (ref 3.8–10.8)

## 2018-04-02 LAB — COMPREHENSIVE METABOLIC PANEL
AG Ratio: 1.2 (calc) (ref 1.0–2.5)
ALT: 5 U/L — ABNORMAL LOW (ref 9–46)
AST: 11 U/L (ref 10–35)
Albumin: 3.9 g/dL (ref 3.6–5.1)
Alkaline phosphatase (APISO): 51 U/L (ref 40–115)
BUN: 18 mg/dL (ref 7–25)
CO2: 24 mmol/L (ref 20–32)
Calcium: 9 mg/dL (ref 8.6–10.3)
Chloride: 104 mmol/L (ref 98–110)
Creat: 1.17 mg/dL (ref 0.70–1.18)
Globulin: 3.2 g/dL (calc) (ref 1.9–3.7)
Glucose, Bld: 142 mg/dL — ABNORMAL HIGH (ref 65–99)
Potassium: 4 mmol/L (ref 3.5–5.3)
Sodium: 141 mmol/L (ref 135–146)
Total Bilirubin: 0.7 mg/dL (ref 0.2–1.2)
Total Protein: 7.1 g/dL (ref 6.1–8.1)

## 2018-04-02 LAB — THYROID PANEL WITH TSH
Free Thyroxine Index: 2.6 (ref 1.4–3.8)
T3 Uptake: 31 % (ref 22–35)
T4, Total: 8.3 ug/dL (ref 4.9–10.5)
TSH: 2.95 mIU/L (ref 0.40–4.50)

## 2018-04-02 LAB — VITAMIN B12: Vitamin B-12: 236 pg/mL (ref 200–1100)

## 2018-04-02 LAB — FERRITIN: Ferritin: 107 ng/mL (ref 24–380)

## 2018-04-02 LAB — SEDIMENTATION RATE: Sed Rate: 11 mm/h (ref 0–20)

## 2018-04-02 LAB — VITAMIN D 25 HYDROXY (VIT D DEFICIENCY, FRACTURES): Vit D, 25-Hydroxy: 7 ng/mL — ABNORMAL LOW (ref 30–100)

## 2018-04-02 LAB — C-REACTIVE PROTEIN: CRP: 1.1 mg/L (ref <8.0)

## 2018-04-02 LAB — PSA: PSA: 0.8 ng/mL (ref <=4.0)

## 2018-04-02 NOTE — Telephone Encounter (Signed)
Labs show:  1.  Severe vitamin D deficiency.  This weakens bones, causes musculoskeletal pain, and decreases immune function.  We want the level to be 50-80.  I recommend taking over-the-counter Vitamin D3 (5,000 IU tablets), two of them daily for the next 6 months (total of 10,000 IU daily) and then we'll recheck levels to see if we need a lower maintenance dosage.  2.  Anemia is mild, but doesn't appear to be the iron-deficiency type.  B12 level is low, so this could be the cause of the anemia.  I recommend taking vitamin B12 at 1,000 mcg daily and we'll recheck levels in about 3 months.  3.  Kidney function is now normal.  Glucose was 142, but all other labs were normal.

## 2018-04-04 ENCOUNTER — Telehealth (INDEPENDENT_AMBULATORY_CARE_PROVIDER_SITE_OTHER): Payer: Self-pay | Admitting: Family Medicine

## 2018-04-04 NOTE — Telephone Encounter (Signed)
Pt called and said he talked to the insurance company and they told him that he needs to speak with his doctor to get the brand name  of meter that will speak with him.

## 2018-04-04 NOTE — Telephone Encounter (Signed)
Advised the patient of his lab results.  He read back to me the instructions I had given on the vitamin D3 and the vitamin B12 and when these levels should be rechecked.  He will call back to schedule the recheck in 3 months. He will also contact his insurance company about whether or not they cover these vitamins in full, if written as Rx - he will go ahead and get these OTC so he can start asap. He asked about getting a new glucose monitor that talks, as he has issues with his sight. He will check with his insurance company on this as well, to see if it would be covered, along with any needed supplies - he will call us back with further information.

## 2018-04-04 NOTE — Telephone Encounter (Signed)
Tried to call patient back to let him know Dr. Junius Roads wrote a script for his glucose meter and supplies.  Got a message that the number was no longer in service (just spoke with him at this number earlier today).  Will try again when back in the office on 04/07/18.

## 2018-04-07 ENCOUNTER — Telehealth (INDEPENDENT_AMBULATORY_CARE_PROVIDER_SITE_OTHER): Payer: Self-pay

## 2018-04-07 NOTE — Telephone Encounter (Signed)
Scott Willis checked his records at home to see when his last HgbA1C was - it will be 3 months from the last time on 04/19/18.  Should he come in then just for the bloodwork or should he come for an office visit for a recheck?

## 2018-04-07 NOTE — Telephone Encounter (Signed)
Spoke with the patient.  Mailing the Rx for his talking glucose meter to him.

## 2018-04-08 NOTE — Telephone Encounter (Signed)
Blood work will be fine.  No visit needed.

## 2018-04-15 ENCOUNTER — Telehealth (INDEPENDENT_AMBULATORY_CARE_PROVIDER_SITE_OTHER): Payer: Self-pay | Admitting: Family Medicine

## 2018-04-15 NOTE — Telephone Encounter (Signed)
Patient called asked if Dr Junius Roads would go online to [employer] site and complete the form stating patient need the talking meter for medical reasons. Patient said the insurance company is needing this information to approve him getting it. The number to contact patient is [phone]

## 2018-04-18 ENCOUNTER — Telehealth (INDEPENDENT_AMBULATORY_CARE_PROVIDER_SITE_OTHER): Payer: Self-pay

## 2018-04-18 ENCOUNTER — Other Ambulatory Visit (INDEPENDENT_AMBULATORY_CARE_PROVIDER_SITE_OTHER): Payer: Self-pay | Admitting: Family Medicine

## 2018-04-18 MED ORDER — TAMSULOSIN HCL 0.4 MG PO CAPS: 0.4000 mg | ORAL_CAPSULE | Freq: Every day | ORAL | 1 refills | Status: DC

## 2018-04-18 NOTE — Telephone Encounter (Signed)
Patient had called stating that a Rx for Tamsulosin needed to be sent to his pharmacy.  Cb# is [phone].  Please advise.  Thank You.

## 2018-04-18 NOTE — Telephone Encounter (Signed)
Please advise 

## 2018-04-18 NOTE — Telephone Encounter (Signed)
I called patient.  I will need to call [employer] to find out exactly what they need to authorize this meter. I will call him back with a progress report.

## 2018-04-18 NOTE — Telephone Encounter (Signed)
Patient advised.

## 2018-04-18 NOTE — Telephone Encounter (Signed)
Patient would like a call back.  Cb# is [phone].Thank you.  Please advise.

## 2018-04-18 NOTE — Telephone Encounter (Signed)
Appointment scheduled for 04/22/18 at 8 am to see me for blood draw only (Hgb A1C).

## 2018-04-18 NOTE — Telephone Encounter (Signed)
Sent!

## 2018-04-19 NOTE — Telephone Encounter (Signed)
The patient called me back to let me know that someone from [place] called him back about the glucometer.  They do not pay for the Prodigy brand, but will pay for the One Touch Talking Glucometer. No prior authorization needed.  He would like for [place] to fax an Rx for that meter, plus the strips and lancets to [facility].

## 2018-04-19 NOTE — Telephone Encounter (Signed)
I called the patient - he had new information on the talking glucometer. See other message on this subject.

## 2018-04-20 ENCOUNTER — Other Ambulatory Visit (INDEPENDENT_AMBULATORY_CARE_PROVIDER_SITE_OTHER): Payer: Self-pay | Admitting: Family Medicine

## 2018-04-20 MED ORDER — BLOOD GLUCOSE MONITOR KIT: PACK | 0 refills | Status: AC

## 2018-04-20 NOTE — Telephone Encounter (Signed)
Rx written.

## 2018-04-20 NOTE — Telephone Encounter (Signed)
Faxed Rx to [facility].

## 2018-04-22 ENCOUNTER — Ambulatory Visit (INDEPENDENT_AMBULATORY_CARE_PROVIDER_SITE_OTHER): Payer: PPO

## 2018-04-22 DIAGNOSIS — E1139 Type 2 diabetes mellitus with other diabetic ophthalmic complication: Secondary | ICD-10-CM

## 2018-04-22 DIAGNOSIS — E1165 Type 2 diabetes mellitus with hyperglycemia: Secondary | ICD-10-CM | POA: Diagnosis not present

## 2018-04-22 NOTE — Progress Notes (Signed)
Hgb A1C drawn

## 2018-04-23 ENCOUNTER — Telehealth (INDEPENDENT_AMBULATORY_CARE_PROVIDER_SITE_OTHER): Payer: Self-pay | Admitting: Family Medicine

## 2018-04-23 LAB — HEMOGLOBIN A1C
Hgb A1c MFr Bld: 6.2 % of total Hgb — ABNORMAL HIGH (ref <5.7)
Mean Plasma Glucose: 131 (calc)
eAG (mmol/L): 7.3 (calc)

## 2018-04-23 NOTE — Telephone Encounter (Signed)
A1C looks good at 6.2.  Recheck in 6 months.

## 2018-04-25 NOTE — Telephone Encounter (Signed)
I called and advised the patient. 

## 2018-05-19 ENCOUNTER — Other Ambulatory Visit: Payer: Self-pay

## 2018-05-19 NOTE — Patient Outreach (Signed)
[facility]) Care Management  05/19/2018  [name] [birth year] [id number]   Medication Adherence call to  [name] patients telephone number is disconnected.   [place] Management Direct Dial [phone]  Fax [phone] [email or web address]

## 2018-05-23 ENCOUNTER — Encounter (INDEPENDENT_AMBULATORY_CARE_PROVIDER_SITE_OTHER): Payer: PPO | Admitting: Ophthalmology

## 2018-05-27 ENCOUNTER — Encounter (INDEPENDENT_AMBULATORY_CARE_PROVIDER_SITE_OTHER): Payer: HMO | Admitting: Ophthalmology

## 2018-05-27 DIAGNOSIS — H35033 Hypertensive retinopathy, bilateral: Secondary | ICD-10-CM | POA: Diagnosis not present

## 2018-05-27 DIAGNOSIS — I10 Essential (primary) hypertension: Secondary | ICD-10-CM

## 2018-05-27 DIAGNOSIS — H353231 Exudative age-related macular degeneration, bilateral, with active choroidal neovascularization: Secondary | ICD-10-CM | POA: Diagnosis not present

## 2018-05-27 DIAGNOSIS — H43813 Vitreous degeneration, bilateral: Secondary | ICD-10-CM | POA: Diagnosis not present

## 2018-06-13 ENCOUNTER — Other Ambulatory Visit: Payer: Self-pay

## 2018-06-13 NOTE — Patient Outreach (Signed)
  [facility]) Care Management Chronic Special Needs Program   06/13/2018  Name: LAROY MUSTARD, DOB: 24-Aug-1940  MRN: [id number]  The client was discussed in today's interdisciplinary care team meeting.  The following issues were discussed:  Client's needs, Key risk triggers/risk stratification, Care Plan, Coordination of care and Issues/barriers to care  Participants present:  Mahlon Gammon, RNCM; Peter Garter, RNCM; Thea Silversmith, RNCM  Recommendations/Plan: Diabetes program, send advanced directive packet, pharmacy referral due to sometimes goes without medications due to cost.  Follow-up:  In 2-4 months.  Thea Silversmith, RN, MSN, [place] [phone]

## 2018-06-14 ENCOUNTER — Other Ambulatory Visit: Payer: Self-pay | Admitting: Pharmacist

## 2018-06-14 NOTE — Patient Outreach (Signed)
[facility]) Care Management  [place]   06/14/2018  KONOR NOREN Mar 27, 1941 [id number]  Reason for referral: Medication Assistance, glucometer  Referral source: [facility] RN with [employer] C-SNP Current insurance:[employer]  PMHx includes but not limited to:  Tobacco abuse, hx atrial fibrillation, anemia, T2DM, peripheral neuropathy, HTN, HLD, BPH, chronic left-sided sinus drainage, depression, legally blind  Noted blood glucose meter kit and supplies ordered 04/20/2018 but Dr. Eunice Blase  Outreach:  Successful telephone call with Mr. Majette.  HIPAA identifiers verified.   Patient reports that he self-manages his medications by using a box to keep medications taken in the morning and another box for medications in the evening.  He takes medications from the pillbottles and does not use a pillbox.  He reports that he has difficulty reading the labels on the medications due to his impaired vision.  He reports that he has to pay someone to drive to pick up his medications for him and that currently they are not all synced up to be due the same day, therefore he often has to pay someone multiples times a month to deliver the medications.    Patient requested that I call him at another time as he is at work and a Barista arrived.    Plan:  I will contact patient again in the next 1-2 days to discuss medications again.   Ralene Bathe, PharmD, [place] [phone]

## 2018-06-15 ENCOUNTER — Other Ambulatory Visit: Payer: Self-pay | Admitting: Pharmacist

## 2018-06-15 ENCOUNTER — Ambulatory Visit: Payer: Self-pay | Admitting: Pharmacist

## 2018-06-15 NOTE — Patient Outreach (Signed)
[facility]) Care Management  [place]   06/15/2018  MIKELL KAZLAUSKAS 14-Feb-1941 [id number]   Reason for referral: Medication Assistance, glucometer  Referral source: [facility] RN with [employer] C-SNP Current insurance:[employer]  PMHx includes but not limited to:  Tobacco abuse, hx atrial fibrillation, anemia, T2DM, peripheral neuropathy, HTN, HLD, BPH, chronic left-sided sinus drainage, depression, legally blind  Noted blood glucose meter kit and supplies ordered 04/20/2018  Outreach:  Successful telephone call with Mr. Poitras.  HIPAA identifiers verified.   Subjective:  Patient agreeable to review medications.  He states that he is interested in learning about pharmacies that could deliver medications to him due to lack of transportation.  He denies have any specific question or concerns about medications.  He denies having cost issues at the moment but states he went into the coverage gap in October 2019. Patient reports that he is not checking his blood sugar currently because he was unable to find a talking meter (due to legal blindness) covered by his insurance at [employer].   Objective: Lab Results  Component Value Date   CREATININE 1.17 04/01/2018   CREATININE 1.31 (H) 04/24/2015   CREATININE 1.27 (H) 04/23/2015    Lab Results  Component Value Date   HGBA1C 6.2 (H) 04/22/2018    Lipid Panel     Component Value Date/Time   CHOL 186 06/21/2014 0919   TRIG 74 06/21/2014 0919   HDL 53 06/21/2014 0919   CHOLHDL 3.5 06/21/2014 0919   CHOLHDL 6.1 02/22/2008 0440   VLDL 15 02/22/2008 0440   LDLCALC 118 (H) 06/21/2014 0919    BP Readings from Last 3 Encounters:  04/01/18 (!) 203/88  04/19/17 (!) 154/80  04/18/17 (!) 142/68    Allergies  Allergen Reactions  . Bee Pollen Anaphylaxis  . Tape Other (See Comments)    Any medical tape, Rash  Any medical tape, Rash  Any medical tape, Rash   . Fluorescein-Benoxinate Other (See Comments)     Rash  Rash   . Metformin Other (See Comments)    Severe Diarrhea  Severe Diarrhea   . Metformin And Related     Severe Diarrhea   . Morphine And Related Other (See Comments)    Kidney failure  Kidney failure  Kidney failure   . Statins Other (See Comments)    Loss of appetite Loss of appetite Loss of appetite    Medications Reviewed Today    Reviewed by Rudean Haskell, RPH (Pharmacist) on 06/15/18 at 1533  Med List Status: <None>  Medication Order Taking? Sig Documenting Provider Last Dose Status Informant  blood glucose meter kit and supplies KIT [id number] Yes Check CBG QID Hilts, Michael, MD Taking Active   finasteride (PROSCAR) 5 MG tablet [id number] Yes Take 5 mg by mouth at bedtime.  [provider] Taking Active Self  moxifloxacin (VIGAMOX) 0.5 % ophthalmic solution [id number] No Place 1 drop into the right eye 3 (three) times daily. Every 16 weeks for 4 days after surgery [provider] Not Taking Active Self  Polyethyl Glycol-Propyl Glycol (SYSTANE OP) [id number] Yes Apply 1 drop to eye as needed (dry eyes).  [provider] Taking Active Self  sitaGLIPtin (JANUVIA) 100 MG tablet [id number] Yes Take 100 mg by mouth daily.  [provider] Taking Active Self  tamsulosin (FLOMAX) 0.4 MG CAPS capsule [id number] Yes Take 1 capsule (0.4 mg total) by mouth daily. Hilts, Michael, MD Taking Active   venlafaxine (EFFEXOR) 75 MG tablet  [id number] Yes TAKE 1 TABLET BY MOUTH ONCE DAILY WITH FOOD FOR 30 DAYS [provider] Taking Active           Assessment:  Drugs sorted by system:  Neurologic/Psychologic: effexor  Endocrine: sitagliptin  Topical: Systane eye drops  Genitourinary: finasteride, tamsulosin  Medication Assistance Findings:  No medication assistance needs identified   I reviewed [employer] patient assistance program for Januvia and offered to assist patient with completing paperwork.  Patient declined at the moment but  voiced understanding.  He is aware he can contact me later in the year if he wishes to apply.    We discussed possible pharmacies that could assist with free transportation.  Patient is interested in using [facility].    Care coordination call placed to [facility] pharmacy.  Pharmacy will transfer prescriptions from [facility] and will be in touch with him regarding when first delivery will be sent out, hopefully tomorrow if no issues  They will also sync up all medication refills and can include OTCs.  They will look into a talking meter and try to run all options through his insurance.   Plan: Will f/u with patient next week regarding delivery of medications and glucometer  Ralene Bathe, PharmD, [place] [phone]

## 2018-06-21 ENCOUNTER — Telehealth (INDEPENDENT_AMBULATORY_CARE_PROVIDER_SITE_OTHER): Payer: Self-pay | Admitting: Family Medicine

## 2018-06-21 ENCOUNTER — Other Ambulatory Visit: Payer: Self-pay | Admitting: Pharmacist

## 2018-06-21 MED ORDER — VENLAFAXINE HCL 75 MG PO TABS: 75.0000 mg | ORAL_TABLET | Freq: Two times a day (BID) | ORAL | 1 refills | Status: DC

## 2018-06-21 NOTE — Telephone Encounter (Signed)
Rx sent 

## 2018-06-21 NOTE — Telephone Encounter (Signed)
Patient has changed to [facility] on [address].  They need a new RX for Effexor and a talking glucometer along with the testing supplies.  Please do not specify the brand of talking glucometer because they will have to run it through his insurance to know which one his insurance with pay for.  The telephone number for the pharmacy is [phone] and fax is [phone].  Thank you.

## 2018-06-21 NOTE — Telephone Encounter (Signed)
Please advise on the new Rx to [place] and sign the new Rx for a talking glucometer.

## 2018-06-21 NOTE — Telephone Encounter (Signed)
Faxed new Rx for a talking glucometer with strips & lancets and diagnosis codes (E11.39, E11.65) to [facility] [phone].

## 2018-06-21 NOTE — Patient Outreach (Signed)
[facility]) Care Management  [place] 06/21/2018  Scott Willis Sep 10, 1940 [id number]  Reason for call: f/u on transfer of medications to [place]  Successful call to Scott Willis.  Patient reports that his new pharmacy has delivered 2 medications but he is still waiting on remaining 2 prescriptions.  He also reports he has not heard back from the pharmacist about a talking glucometer.    Care coordination call to [facility].  Per pharmacist, Januvia and Finasteride delivered.  Flomax too early to refill, no refills left on Effexor, no RX for glucometer.  Also, pharmacy is unable to bill Medicare Part B but can bill Part D.  There is a prodigy meter kit ($12.99) with includes meter and #100 strips.  Additional strips cost $19.64 / #50.  Pharmacist will reach out to patient to inquire if he is interested in this option.   Per HTA formulary, glucometers covered through Part D plan:  Freestyle, Precision, and One Touch.  Patient will likely need to use [place] to obtain talking glucometer if he wishes to have cost savings through Medicare Part B.    Care coordination call to [facility].  Pharmacy has RX for talking glucometer on file but it does not have diagnosis code which is necessary for Part B billing.  Per pharmacist, they carry [employer] talking glucometer in store.  If script comes in, they will run claim through Medicare Part B for pricing.   Call placed to Dr. Eunice Blase, PCP.  Message left requesting new RX for Effexor to [facility] and Talking Glucometer to [place].    Plan: Will f/u with patient again later this week regarding glucometer decision.   Ralene Bathe, PharmD, [place] [phone]

## 2018-06-21 NOTE — Telephone Encounter (Signed)
Follow up  Glucometer needs to remain at [facility] due to they're only one to bill with patients insurance. Has to have diagnosis code. Include strips.

## 2018-06-22 ENCOUNTER — Ambulatory Visit (INDEPENDENT_AMBULATORY_CARE_PROVIDER_SITE_OTHER): Payer: HMO | Admitting: Podiatry

## 2018-06-22 ENCOUNTER — Encounter: Payer: Self-pay | Admitting: Podiatry

## 2018-06-22 ENCOUNTER — Other Ambulatory Visit: Payer: Self-pay

## 2018-06-22 DIAGNOSIS — B351 Tinea unguium: Secondary | ICD-10-CM | POA: Diagnosis not present

## 2018-06-22 DIAGNOSIS — M2141 Flat foot [pes planus] (acquired), right foot: Secondary | ICD-10-CM

## 2018-06-22 DIAGNOSIS — E0843 Diabetes mellitus due to underlying condition with diabetic autonomic (poly)neuropathy: Secondary | ICD-10-CM

## 2018-06-22 DIAGNOSIS — M79676 Pain in unspecified toe(s): Secondary | ICD-10-CM | POA: Diagnosis not present

## 2018-06-22 DIAGNOSIS — M2142 Flat foot [pes planus] (acquired), left foot: Secondary | ICD-10-CM

## 2018-06-22 NOTE — Progress Notes (Signed)
Complaint:  Visit Type: Patient returns to my office for continued preventative foot care services. Complaint: Patient states" my nails have grown long and thick and become painful to walk and wear shoes" Patient has been diagnosed with DM with neuropathy. The patient presents for preventative foot care services. No changes to ROS  Podiatric Exam: Vascular: dorsalis pedis and posterior tibial pulses are palpable bilateral. Capillary return is immediate. Temperature gradient is WNL. Skin turgor WNL  Sensorium: Diminished/absent Semmes Weinstein monofilament test. Normal tactile sensation bilaterally. Nail Exam: Pt has thick disfigured discolored nails with subungual debris noted bilateral entire nail hallux through fifth toenails Ulcer Exam: There is no evidence of ulcer or pre-ulcerative changes or infection. Orthopedic Exam: Muscle tone and strength are WNL. No limitations in general ROM. No crepitus or effusions noted. Foot type and digits show no abnormalities. Bony prominences are unremarkable. Skin: No Porokeratosis. No infection or ulcers  Diagnosis:  Onychomycosis, , Pain in right toe, pain in left toes,  Diabetes with neuropathy Treatment & Plan Procedures and Treatment: Consent by patient was obtained for treatment procedures.   Debridement of mycotic and hypertrophic toenails, 1 through 5 bilateral and clearing of subungual debris. No ulceration, no infection noted.  Return Visit-Office Procedure: Patient instructed to return to the office for a follow up visit 10 weeks  for continued evaluation and treatment.    Barnell Shieh DPM 

## 2018-06-23 ENCOUNTER — Ambulatory Visit: Payer: Self-pay | Admitting: Pharmacist

## 2018-06-23 ENCOUNTER — Other Ambulatory Visit: Payer: Self-pay | Admitting: Pharmacist

## 2018-06-23 NOTE — Patient Outreach (Signed)
[facility]) Care Management  [place] 06/23/2018  RICKARDO BRINEGAR 1941-02-02 [id number]  Reason for call: f/u on glucometer  Patient reports he has spoken with Arrie Aran, pharmacist at [facility], and has been given pricing information on the talking glucometers available.  I also updated him on pricing at [facility].  Patient voiced understanding but also stated he was told "by Tiffany in [place]" that there were 3 covered talking glucometers that would be no charge."  I explained that he may be mistaken as there are 3 regular glucometers covered by HTA but not a talking glucometer that I am aware of.  Patient adamant that he will not pay for glucometer and will only use if it is no charge.  He will contact his insurance if he wishes to pursue this further.  No further questions at this time.   Plan: Will close [facility] pharmacy case.  Thank you for allowing [facility] pharmacy to be involved in this patient's care.    Ralene Bathe, PharmD, [place] [phone]

## 2018-06-29 ENCOUNTER — Telehealth: Payer: Self-pay | Admitting: Cardiology

## 2018-06-29 NOTE — Telephone Encounter (Signed)
Patient called to triage symptoms for appt tomorrow in light of COVID-19.  Patient is feeling well overall, no chest pain, shortness of breath, or other concerning symptoms. He appreciated call, as he was wondering what he should do with appt. Denies fevers, chills, recent infections. Has a history of fractured bone near his sinuses and has chronic drainage from this, which is unchanged. 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  He had been scheduled for a visit to establish care with me (former Dr. [name] patient) for an annual checkup. He is agreeable to rescheduling. He prefers Thursday AM appt, as early as possible. Reviewed schedule, will have him come Thursday 4/9 at 8:20 AM (if he arrives earlier than 8:20, I offered to see him ASAP).  All questions answered. Gave instructions on when to call office in the interim.  Buford Dresser, MD, PhD [facility]  [address], [place], [place] [address] [phone]

## 2018-06-29 NOTE — Telephone Encounter (Signed)
Appointment rescheduled for 07/21/18 at 8:20.

## 2018-06-30 ENCOUNTER — Ambulatory Visit: Payer: HMO | Admitting: Cardiology

## 2018-07-07 ENCOUNTER — Ambulatory Visit (INDEPENDENT_AMBULATORY_CARE_PROVIDER_SITE_OTHER): Payer: Self-pay

## 2018-07-07 ENCOUNTER — Encounter (INDEPENDENT_AMBULATORY_CARE_PROVIDER_SITE_OTHER): Payer: Self-pay | Admitting: Family Medicine

## 2018-07-07 ENCOUNTER — Ambulatory Visit (INDEPENDENT_AMBULATORY_CARE_PROVIDER_SITE_OTHER): Payer: HMO | Admitting: Family Medicine

## 2018-07-07 ENCOUNTER — Telehealth (INDEPENDENT_AMBULATORY_CARE_PROVIDER_SITE_OTHER): Payer: Self-pay | Admitting: *Deleted

## 2018-07-07 DIAGNOSIS — M25552 Pain in left hip: Secondary | ICD-10-CM | POA: Diagnosis not present

## 2018-07-07 DIAGNOSIS — R0781 Pleurodynia: Secondary | ICD-10-CM

## 2018-07-07 MED ORDER — OXYCODONE-ACETAMINOPHEN 5-325 MG PO TABS: 1.0000 | ORAL_TABLET | Freq: Four times a day (QID) | ORAL | 0 refills | Status: DC | PRN

## 2018-07-07 NOTE — Progress Notes (Signed)
Office Visit Note   Patient: Scott Willis           Date of Birth: September 18, 1940           MRN: [id number] Visit Date: 07/07/2018 Requested by: Eunice Blase, MD [address] [place], [place] [address] PCP: Eunice Blase, MD  Subjective: Chief Complaint  Patient presents with  . Left Hip - Pain  . Chest - Pain    Left ribs - post fall yesterday evening in the parking lot - hit ribs and hip on concrete    HPI: He is here with left-sided anterior chest pain and left lateral hip pain.  Yesterday he tripped over a speed bump and fell forward and onto his left side.  Able to get up and walk but with immediate pain.  He has had tenderness in the anterior chest with some pain when he coughs or takes a deep breath, but no shortness of breath.  He has pain on the lateral hip when trying to sleep or walk.              ROS:   All other systems were reviewed and are negative.  Objective: Vital Signs: There were no vitals taken for this visit.  Physical Exam:  General:  Alert and oriented, in no acute distress. Pulm:  Breathing unlabored. Psy:  Normal mood, congruent affect. Skin: No abrasion on his chest or lateral hip.  He has a scab on the lateral left elbow from the fall but no pain there. Chest: Lungs are clear to auscultation throughout.  Slightly tender in the anterior chest but no crepitation or step-off, no point tenderness to suggest a fracture. Left hip: Slightly tender near the greater trochanter, no pain with internal and external rotation.  No pain with hip flexion or abduction against resistance.  Imaging: X-rays chest/ribs: There are a couple old healed rib fractures but I do not see a definite new fracture.  X-rays left hip: Mild arthritis, there is a cortical irregularity on the greater tuberosity possibly representing a nondisplaced fracture but no fracture of the femoral neck.  Assessment & Plan: 1.  Left chest wall contusion, possible occult fracture -Anticipate  6 to 8 weeks healing time if a fracture is present.  Follow-up in a couple weeks if not starting to make progress.  2.  Left hip contusion, cannot rule out nondisplaced greater trochanter fracture -Weightbearing as tolerated using cane or walker for support.  Percocet for severe pain, use sparingly.  Repeat x-rays in 1 to 2 weeks if not improving.     Procedures: No procedures performed  No notes on file     PMFS History: Patient Active Problem List   Diagnosis Date Noted  . Closed fracture of left side of maxilla with routine healing 04/21/2017  . Syncope 04/23/2015  . Syncope and collapse 04/23/2015  . Depression 09/11/2013  . Hereditary and idiopathic peripheral neuropathy 08/29/2013  . Dysphagia, pharyngoesophageal phase 08/29/2013  . Rotator cuff tear 08/22/2013  . Left shoulder pain 07/31/2013  . Tobacco use disorder 04/19/2013  . Acute bronchitis 04/19/2013  . Influenza 04/11/2013  . Pallor 04/11/2013  . Gastric ulcer 04/11/2013  . Melena 04/11/2013  . Macular degeneration   . Benign essential hypertension   . Hyperlipidemia with target low density lipoprotein (LDL) cholesterol less than 100 mg/dL   . Osteoporosis due to androgen therapy   . Low serum testosterone level   . BPH (benign prostatic hyperplasia)   . DM (  diabetes mellitus) type II uncontrolled with eye manifestation ([place])    Past Medical History:  Diagnosis Date  . Arthritis   . Benign essential hypertension   . Blepharitis, squamous    Right Eye   . Blindness   . BPH (benign prostatic hyperplasia)   . DM (diabetes mellitus) type II uncontrolled with eye manifestation ([facility])    fasting 110s  . Hyperlipidemia LDL goal < 100   . Kidney stones   . Low serum testosterone level   . Macular degeneration   . Osteoporosis due to androgen therapy   . Ulcer    gastric    Family History  Problem Relation Age of Onset  . Alzheimer's disease Mother   . Colon cancer Father   . Diabetes Sister      Past Surgical History:  Procedure Laterality Date  . [place]  . BACK SURGERY  03/2006   Removed [place] Dr.Nitka   . CARPAL TUNNEL RELEASE  08/2006  . CARPAL TUNNEL RELEASE  08/2006   Dr.Nitka   . CATARACT EXTRACTION  02/21/2007   Dr.Epps   . EYE SURGERY  2013   Right eye every 8 weeks   . KIDNEY STONE SURGERY  08/11/2007   Dr.Tannenbaum   . LITHOTRIPSY  08/11/2007   Dr.Nesi  . REFRACTIVE SURGERY  09/2005   Macular Degeneration Dr.Mathews   . SHOULDER ARTHROSCOPY WITH SUBACROMIAL DECOMPRESSION, ROTATOR CUFF REPAIR AND BICEP TENDON REPAIR Left 08/22/2013   Procedure:  LEFT SHOULDER DIAGNOSTIC OPERATIVE ARTHROSCOPY ,EXTENSIVE DEBRIDEMENT, BICEPS RELEASE AND TENODESIS, ROTATOR CUFF REPAIR, SUBACROMIAL DECOMPRESSION;  Surgeon: Meredith Pel, MD;  Location: [facility];  Service: Orthopedics;  Laterality: Left;  LEFT SHOULDER DOA,EXTENSIVE DEBRIDEMENT, BICEPS RELEASE AND TENODESIS, ROTATOR CUFF REPAIR, SUBACROMIAL DECOMPRESSION,   Social History   Occupational History    Employer: [employer]    Comment: owner of printing company--[address]  Tobacco Use  . Smoking status: Current Every Day Smoker    Packs/day: 1.50    Years: 59.00    Pack years: 88.50    Types: Cigarettes  . Smokeless tobacco: Never Used  Substance and Sexual Activity  . Alcohol use: No    Alcohol/week: 0.0 standard drinks  . Drug use: No  . Sexual activity: Not on file

## 2018-07-07 NOTE — Telephone Encounter (Signed)
Pt called stating fell last pm and now has severe pain in left hip and ribs, when he coughs his hip hurts and when takes deep breath ribs hurt some, says pain level for hip is 7-9 out 10 and ribs is 3-5 out 10. walks with Kasandra Knudsen but can not bear weight  I advised pt to go to ED as instruced by Karna Christmas K but states he has grievance with hospital. Pt is coming in to this office today at 11am.

## 2018-07-18 ENCOUNTER — Telehealth: Payer: Self-pay

## 2018-07-18 NOTE — Telephone Encounter (Signed)
   Called to rescheduled appointment to virtual visit. Pt states he would prefer to reschedule as he report he is not having any issues.  Cardiac Questionnaire:    Since your last visit or hospitalization:    1. Have you been having new or worsening chest pain? No   2. Have you been having new or worsening shortness of breath? No 3. Have you been having new or worsening leg swelling, wt gain, or increase in abdominal girth (pants fitting more tightly)? No   4. Have you had any passing out spells? No        Primary Cardiologist:  Dr. Harrell Gave   Patient contacted.  History reviewed.  No symptoms to suggest any unstable cardiac conditions.  Based on discussion, with current pandemic situation, we will be postponing this appointment for Scott Willis with a plan for f/u in 4 months or sooner if feasible/necessary.  If symptoms change, he has been instructed to contact our office.   Routing to C19 CANCEL pool for tracking (P CV DIV CV19 CANCEL - reason for visit "other.") and assigning priority (1 = 4-6 wks, 2 = 6-12 wks, 3 = >12 wks).   Meryl Crutch, RN  07/18/2018 3:34 PM         .

## 2018-07-21 ENCOUNTER — Ambulatory Visit: Payer: HMO | Admitting: Cardiology

## 2018-08-03 ENCOUNTER — Other Ambulatory Visit: Payer: Self-pay

## 2018-08-03 NOTE — Patient Outreach (Signed)
  [facility]) Care Management Chronic Special Needs Program  08/03/2018  Name: Scott Willis DOB: 04/04/1941  MRN: [id number]  Mr. Anthonio Mizzell is enrolled in a Chronic Special Needs Plan. RNCM called to follow up. Client answered the phone and stated he was unable to talk because he has customers in front of him. Request RNCM call back at the same phone number [phone]) stating this is the only working contact number for him.  Plan: Chronic care management coordinator will continue attempts to outreach.   Thea Silversmith, RN, MSN, [place] [phone]

## 2018-08-04 ENCOUNTER — Other Ambulatory Visit: Payer: Self-pay

## 2018-08-04 NOTE — Patient Outreach (Signed)
[facility]) Care Management Chronic Special Needs Program  08/04/2018  Name: Scott Willis DOB: [birth year]  MRN: [id number]  [name] is enrolled in a chronic special needs plan for Diabetes. Chronic Care Management Coordinator telephoned client to review health risk assessment and to develop individualized care plan.  Introduced the chronic care management program, importance of client participation, and taking their care plan to all provider appointments and inpatient facilities.   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 Client reports history of Macular degeneration affecting his vision. Reports he uses a magnifier glass, but vision is still limited. He reports he has not checked his blood sugar in over a year because he cannot see the readings and needs a talking glucose meter. He reports last A1C 6.5 and that he continues to remain active/walking.  Client reports getting up often during the night to urinate and questions if the medication finasteride is attributing.   Client PHQ-2 score 6, PHQ-9 score 18. He states he notices that he has recently lost interest in doing things and states "I know that I should be doing more". He reports he gets about 4-6 hours of sleep/night. Client also expressed concern about his business and still he continues to have to pay rent for the office space which he reports is $1400/month. Client reports he was taking Effexor at one time, but taken off the medication by a previous doctor. He reports he will speak with his primary care provider at next visit.   Client states he does not remember receiving the Care Management packet previously sent by [facility].   Goals Addressed            This Visit's Progress   .  Acknowledge receipt of Advanced Directive package   On track   . Advanced Care Planning completed per client directive within the  next 6-9 months.   On track   . Client understands the importance of follow-up with providers by attending scheduled visits   On track   . Client will report abillity to obtain Medications within the next 3-6 months.   On track   . Client will report improved coping within the next 6 months.        PLEASE FOLLOW UP WITH YOUR PRIMARY CARE PROVIDER REGARDING HIS RECOMMENDATIONS.  I HAVE SENT A REFERRAL TO [facility] SOCIAL WORK TO ASSIST YOU AND PROVIDE AVAILABLE RESOURCES.  PLEASE DISCUSS YOUR SLEEP DIFFICULTY WITH YOUR PRIMARY CARE PROVIDER.       Marland Kitchen Client will use Assistive Devices as needed and verbalize understanding of device use   On track   . Diabetes Patient stated goal: to get A1C down. (pt-stated)   On track   . Maintain timely refills of diabetic medication as prescribed within the year .   On track   . Obtain annual  Lipid Profile, LDL-C   On track   . Obtain Annual Eye (retinal)  Exam    On track   . Obtain Annual Foot Exam   On track   . Obtain annual screen for micro albuminuria (urine) , nephropathy (kidney problems)   On track   . Obtain Hemoglobin A1C at least 2 times per year   On track   . Visit Primary Care Provider or Endocrinologist at least 2 times per year    On track     [facility] encouraged client to  contact health care concierge for benefit questions as needed. RNCM encouraged client to call 24 hour nurse advice line as needed. RNCM encouraged client to call RNCM as needed-confirmed client has contact number.  Plan:  Send successful outreach letter with a copy of their individualized care plan and Send individual care plan to provider. Notify primary care regarding PHQ scores. Chronic care management coordination will follow up with client in: 1-2 weeks. RNCM will follow up on talking glucose meter, pharmacy referral regarding medication question; social work referral regarding PHQ2/PHQ9 score. Send advanced directive packet.   Thea Silversmith, RN, MSN,  [place] [phone]

## 2018-08-05 ENCOUNTER — Telehealth (INDEPENDENT_AMBULATORY_CARE_PROVIDER_SITE_OTHER): Payer: Self-pay

## 2018-08-05 ENCOUNTER — Other Ambulatory Visit: Payer: Self-pay

## 2018-08-05 NOTE — Telephone Encounter (Signed)
Nurse with [facility] routed her last note to you and just wanted to make sure you saw it especially regarding his PHQ score

## 2018-08-05 NOTE — Patient Outreach (Signed)
  [facility]) Care Management Chronic Special Needs Program    08/05/2018  Name: EZZARD DITMER, DOB: 09/12/1940  MRN: [id number]   Mr. Lucan Riner is enrolled in a chronic special needs plan for Diabetes.  Care Coordination: RNCM called primary care office-updated regarding client's PHQ-2/PHQ9 score and note routed to primary care. RNCM's contact number provided if questions.   Plan: continue to follow.  Thea Silversmith, RN, MSN, [place] [phone]

## 2018-08-05 NOTE — Telephone Encounter (Signed)
Got it, thanks.  Is he coming in soon to discuss that?

## 2018-08-09 ENCOUNTER — Other Ambulatory Visit: Payer: Self-pay | Admitting: *Deleted

## 2018-08-09 NOTE — Patient Outreach (Signed)
[facility]) Care Management  08/09/2018  Scott Willis April 17, 1940 [id number]   CSW made an initial attempt to try and contact patient today to perform the phone assessment, as well as assess and assist with social work needs and services; however, patient reported that he was unable to talk at the time of CSW's call.  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  Patient requested that CSW call him back at a later time, but was unable to provide a time that would be more convenient for him.  Patient indicated that his only working number is his business and that it would be "hit or miss", whether or not he would be able to talk at CSW's next call attempt.  CSW provided patient with CSW's contact information, encouraging patient to contact CSW directly at a time that is convenient for him.  CSW will make a second outreach attempt within the next 3-4 business days, if a return call is not received from patient in the meantime.    Nat Christen, BSW, MSW, LCSW  Licensed Education officer, environmental [facility] System  Mailing [place] [address], [place], [place] [address] Physical Address-[address], [place], [place] [address] Toll Free Main # [phone] Fax # [phone] Cell # [phone]  Office # [phone] [email or web address]

## 2018-08-09 NOTE — Telephone Encounter (Signed)
The patient has an appointment with a licensed clinical social worker this morning.  Per Dr. Junius Roads, will wait & see what comes of this visit and if he needs any followup.

## 2018-08-10 ENCOUNTER — Other Ambulatory Visit: Payer: Self-pay

## 2018-08-10 NOTE — Patient Outreach (Signed)
  [facility]) Care Management Chronic Special Needs Program   08/10/2018  Name: Scott Willis, DOB: 1941-01-21  MRN: [id number]  The client was discussed in today's interdisciplinary care team meeting.  The following issues were discussed:  Client's needs, Key risk triggers/risk stratification, Care Plan, Coordination of care and Issues/barriers to care  Participants present: Mahlon Gammon, RNCM; Peter Garter, RNCM; Thea Silversmith, RNCM; Dr. Bettey Costa, Dr. Amalia Hailey  Recommendations/plan:  Care coordination with primary care and HTA regarding meter; primary care is aware of PHQ2/9 score, care coordination with LCSW.  Thea Silversmith, RN, MSN, [place] [phone]

## 2018-08-12 ENCOUNTER — Encounter (INDEPENDENT_AMBULATORY_CARE_PROVIDER_SITE_OTHER): Payer: HMO | Admitting: Ophthalmology

## 2018-08-12 ENCOUNTER — Other Ambulatory Visit: Payer: Self-pay

## 2018-08-12 ENCOUNTER — Encounter: Payer: Self-pay | Admitting: *Deleted

## 2018-08-12 ENCOUNTER — Other Ambulatory Visit: Payer: Self-pay | Admitting: *Deleted

## 2018-08-12 DIAGNOSIS — I10 Essential (primary) hypertension: Secondary | ICD-10-CM | POA: Diagnosis not present

## 2018-08-12 DIAGNOSIS — H353231 Exudative age-related macular degeneration, bilateral, with active choroidal neovascularization: Secondary | ICD-10-CM | POA: Diagnosis not present

## 2018-08-12 DIAGNOSIS — H35033 Hypertensive retinopathy, bilateral: Secondary | ICD-10-CM | POA: Diagnosis not present

## 2018-08-12 DIAGNOSIS — H43813 Vitreous degeneration, bilateral: Secondary | ICD-10-CM | POA: Diagnosis not present

## 2018-08-12 NOTE — Patient Outreach (Signed)
[facility]) Care Management  08/12/2018  Scott Willis 12-31-1940 [id number]  CSW was able to make contact with patient today to perform phone assessment, as well as assess and assist with social work needs and services.  CSW introduced self, explained role and types of services provided through [place] [facility] ([place] [facility]).  CSW further explained to patient that CSW works with Thea Silversmith, C-SNP Chronic Care Management Coordinator, also with [place] [facility]. CSW then explained the reason for the call, indicating that Ms. Juleen China thought that patient would benefit from social work services and resources to assist with .  CSW obtained two HIPAA compliant identifiers from patient, which included patient's name and date of birth.  Patient reported that he was taking Effexor 75 MG tablets, twice daily, prior to his Primary Care Physician, Dr. Eunice Blase moving to [place].  Patient indicated that Dr. Junius Roads has since moved back to [facility], but that his attending physician during that time decreased his Effexor to 75 MG, once daily.  Patient admitted that he began seeing a "lack of motivation" and "loss of interest in activities", shortly after his medication dosage was changed.  Patient believes that the problem with his mood is based solely on the fact that he needs to be back on the Effexor twice daily.  CSW agreed to contact Dr. Junius Roads to make him aware of patient's request.  Patient admits to taking his medications exactly as prescribed.  Patient denied the need for counseling and supportive services, reporting that he is "not depressed, per say, just not in the best of spirits".  CSW offered to provide counseling services to patient telephonically, until Covid-19 restrictions have been lifted, and then face-to-face counseling, at least until CSW is able to get patient established with a therapist in the community.  CSW also offered to mail  patient EMMI information pertaining specifically to "Signs and Symptoms of Depression", but patient did not feel like either of these interventions would be necessary, nor did patient wish for CSW to mail him a list of therapists in [facility] that accept [employer].  Per patient's request, CSW has conversed with Ms. Juleen China, explaining that patient would like for Ms. Juleen China to follow-up with him regarding obtaining a talking glucose meter and strips that will be covered through his insurance policy.  CSW agreed to follow-up with patient in three weeks, on Friday, Sep 02, 2018, around [place] to assess his mood, as well as assess need for continued social work involvement.  CSW was able to confirm that patient has the correct contact information for CSW, encouraging patient to contact CSW directly if additional social work needs arise in the meantime.  Patient voiced understanding and was agreeable to this plan.  Nat Christen, BSW, MSW, LCSW  Licensed Education officer, environmental Health System  Mailing [place] [address] [place], [place] [address] Physical Address-[address], [place], [place] [address] Toll Free Main # [phone] Fax # [phone] Cell # [phone]  Office # [phone] [email or web address]

## 2018-08-18 ENCOUNTER — Other Ambulatory Visit: Payer: Self-pay

## 2018-08-18 NOTE — Patient Outreach (Signed)
  [facility]) Care Management Chronic Special Needs Program    08/18/2018  Name: Scott Willis, DOB: 1940-09-21  MRN: [id number]   [name] is enrolled in a chronic special needs plan for Diabetes.  Care Coordination: spoke with [facility] pharmacist, C. Summe regarding talking meter options for client. Client's concern was- he did not want to pay for meter and supplies.  RNCM spoke with K. Darden Utilization management regarding process. Per Ms. Darden, in order for a meter to be covered, would need a one touch, prodigy or freestyle meter. Client can request an exception to benefit is he would like another meter to be covered.   RNCM called [place] as noted in chart prescription for one touch talking meter prescribed. Per representative the only talking meter they offer is the Premier voice,therefore they are unable to dispense the meter they carry and would need a new prescription.  Thea Silversmith, RN, MSN, [place] [phone]

## 2018-08-18 NOTE — Patient Outreach (Signed)
  [facility]) Care Management Chronic Special Needs Program    08/18/2018  Name: Scott Willis, DOB: 11/25/1940  MRN: [id number]   Scott Willis is enrolled in a chronic special needs plan for Diabetes. RNCM spoke with client. He reports that he no longer obtains his medications from [place]. He states he had to change to a pharmacy that delivers. He report he gets his medications from [facility] because they deliver and he can get all his medications at one time. Client reinforced that he is requesting talking diabetic glucose meter/supplies at no cost..   Call placed to [facility]. They report that they do not carry the Premier voice talking meter. They state carry the Prodigy Auto Code meter. RNCM discussed with pharmacist, Scott Willis who will follow up.  Plan: RNCM will continue to follow.  Scott Silversmith, RN, MSN, Scott Willis [phone]

## 2018-09-02 ENCOUNTER — Ambulatory Visit: Payer: HMO | Admitting: *Deleted

## 2018-09-06 ENCOUNTER — Encounter: Payer: Self-pay | Admitting: *Deleted

## 2018-09-06 ENCOUNTER — Other Ambulatory Visit: Payer: Self-pay | Admitting: *Deleted

## 2018-09-06 NOTE — Patient Outreach (Signed)
[facility]) Care Management  09/06/2018  Scott Willis [birth year] [id number]   CSW was able to make brief contact with patient today to follow-up regarding social work services and resources, as well as assess need for continued social work involvement.  Patient admitted that he was not able to talk long because he was trying to meet a deadline at work, but indicated that he is feeling much better, "now that life is getting somewhat back to normal".  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  Patient denied the need for counseling and supportive services, indicating that he is managing his symptoms well and plans to speak with Dr. Junius Roads about prescribing an antidepressant medication.     CSW will perform a case closure on patient, as all goals of treatment have been met from social work standpoint and no additional social work needs have been identified at this time.  CSW will notify Thea Silversmith, C-SNP Chronic Care Management Coordinator with [facility], of CSW's plans to close patient's case.  CSW will fax an update to patient's Primary Care Physician, Dr. Legrand Como Hilts to ensure that they are aware of CSW's involvement with patient's plan of care.  Patient has CSW's contact information and agreed to contact CSW directly if any additional social work needs arise in the near future.   Nat Christen, BSW, MSW, LCSW  Licensed Education officer, [facility]  [place] [address], [place], [place] [address] Physical Address-[address], [place], [place] [address] Toll Free Main # [phone] Fax # [phone] Cell # [phone]  Office # [phone] [email or web address]

## 2018-09-14 ENCOUNTER — Encounter: Payer: Self-pay | Admitting: Podiatry

## 2018-09-14 ENCOUNTER — Other Ambulatory Visit: Payer: Self-pay

## 2018-09-14 ENCOUNTER — Ambulatory Visit: Payer: HMO | Admitting: Podiatry

## 2018-09-14 VITALS — Temp 97.3°F

## 2018-09-14 DIAGNOSIS — E0843 Diabetes mellitus due to underlying condition with diabetic autonomic (poly)neuropathy: Secondary | ICD-10-CM

## 2018-09-14 DIAGNOSIS — M2141 Flat foot [pes planus] (acquired), right foot: Secondary | ICD-10-CM

## 2018-09-14 DIAGNOSIS — M79676 Pain in unspecified toe(s): Secondary | ICD-10-CM

## 2018-09-14 DIAGNOSIS — B351 Tinea unguium: Secondary | ICD-10-CM | POA: Diagnosis not present

## 2018-09-14 DIAGNOSIS — M2142 Flat foot [pes planus] (acquired), left foot: Secondary | ICD-10-CM

## 2018-09-14 NOTE — Progress Notes (Signed)
Complaint:  Visit Type: Patient returns to my office for continued preventative foot care services. Complaint: Patient states" my nails have grown long and thick and become painful to walk and wear shoes" Patient has been diagnosed with DM with neuropathy. The patient presents for preventative foot care services. No changes to ROS  Podiatric Exam: Vascular: dorsalis pedis and posterior tibial pulses are palpable bilateral. Capillary return is immediate. Temperature gradient is WNL. Skin turgor WNL  Sensorium: Diminished/absent Semmes Weinstein monofilament test. Normal tactile sensation bilaterally. Nail Exam: Pt has thick disfigured discolored nails with subungual debris noted bilateral entire nail hallux through fifth toenails Ulcer Exam: There is no evidence of ulcer or pre-ulcerative changes or infection. Orthopedic Exam: Muscle tone and strength are WNL. No limitations in general ROM. No crepitus or effusions noted. Foot type and digits show no abnormalities. Bony prominences are unremarkable. Skin: No Porokeratosis. No infection or ulcers  Diagnosis:  Onychomycosis, , Pain in right toe, pain in left toes,  Diabetes with neuropathy Treatment & Plan Procedures and Treatment: Consent by patient was obtained for treatment procedures.   Debridement of mycotic and hypertrophic toenails, 1 through 5 bilateral and clearing of subungual debris. No ulceration, no infection noted.  Return Visit-Office Procedure: Patient instructed to return to the office for a follow up visit 10 weeks  for continued evaluation and treatment.    Gardiner Barefoot DPM

## 2018-09-15 ENCOUNTER — Telehealth: Payer: Self-pay | Admitting: Family Medicine

## 2018-09-15 MED ORDER — LIDOCAINE 5 % EX PTCH: 1.0000 | MEDICATED_PATCH | CUTANEOUS | 11 refills | Status: DC

## 2018-09-15 MED ORDER — DICLOFENAC SODIUM 1 % TD GEL: 4.0000 g | Freq: Four times a day (QID) | TRANSDERMAL | 6 refills | Status: AC | PRN
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
## 2018-09-15 NOTE — Telephone Encounter (Signed)
Scott Willis this patient called and left VM requesting a call back. Pt stated concerned about a problem he is having.  Please call pt @ [phone]

## 2018-09-15 NOTE — Telephone Encounter (Signed)
Will call in voltaren gel and lidoderm patches.  He can try them both to see if one helps.

## 2018-09-16 ENCOUNTER — Other Ambulatory Visit: Payer: Self-pay | Admitting: Family Medicine

## 2018-09-16 MED ORDER — TAMSULOSIN HCL 0.4 MG PO CAPS: 0.4000 mg | ORAL_CAPSULE | Freq: Every day | ORAL | 3 refills | Status: DC

## 2018-09-16 NOTE — Telephone Encounter (Signed)
I advised the patient these medications were sent in to his pharmacy. He will try these and see if he finds some relief. He is to let us know if he does not improve.

## 2018-09-26 ENCOUNTER — Telehealth: Payer: Self-pay | Admitting: Cardiology

## 2018-09-26 NOTE — Telephone Encounter (Signed)
home phone/ consent/ my chart/ pre reg completed °

## 2018-09-27 ENCOUNTER — Telehealth: Payer: Self-pay | Admitting: Family Medicine

## 2018-09-27 ENCOUNTER — Other Ambulatory Visit: Payer: Self-pay | Admitting: Family Medicine

## 2018-09-27 MED ORDER — FINASTERIDE 5 MG PO TABS: 5.0000 mg | ORAL_TABLET | Freq: Every day | ORAL | 3 refills | Status: DC

## 2018-09-27 NOTE — Telephone Encounter (Signed)
The patient wanted to let us know he no longer uses [facility] - he only uses [facility] on [address] now. His finasteride refill was erroneously sent to [facility] today, but the patient said [facility] is already in the process of transferring it over to them. I deleted [facility] from the patient's preferred pharmacy list, so that only [facility] was left.

## 2018-09-27 NOTE — Telephone Encounter (Signed)
Patient Scott Willis requesting a call from [place], regarding the location his Rx is being sent to(is all he left on message)

## 2018-09-30 ENCOUNTER — Telehealth (INDEPENDENT_AMBULATORY_CARE_PROVIDER_SITE_OTHER): Payer: HMO | Admitting: Cardiology

## 2018-09-30 ENCOUNTER — Encounter: Payer: Self-pay | Admitting: Cardiology

## 2018-09-30 VITALS — Ht 70.5 in | Wt 145.0 lb

## 2018-09-30 DIAGNOSIS — I10 Essential (primary) hypertension: Secondary | ICD-10-CM | POA: Diagnosis not present

## 2018-09-30 DIAGNOSIS — I480 Paroxysmal atrial fibrillation: Secondary | ICD-10-CM | POA: Insufficient documentation

## 2018-09-30 DIAGNOSIS — E785 Hyperlipidemia, unspecified: Secondary | ICD-10-CM | POA: Diagnosis not present

## 2018-09-30 DIAGNOSIS — Z716 Tobacco abuse counseling: Secondary | ICD-10-CM | POA: Diagnosis not present

## 2018-09-30 DIAGNOSIS — F17200 Nicotine dependence, unspecified, uncomplicated: Secondary | ICD-10-CM | POA: Diagnosis not present

## 2018-09-30 DIAGNOSIS — Z7189 Other specified counseling: Secondary | ICD-10-CM | POA: Diagnosis not present

## 2018-09-30 DIAGNOSIS — Z789 Other specified health status: Secondary | ICD-10-CM
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
## 2018-09-30 NOTE — Patient Instructions (Signed)

## 2018-10-06 ENCOUNTER — Ambulatory Visit: Payer: Self-pay

## 2018-10-07 ENCOUNTER — Other Ambulatory Visit: Payer: Self-pay

## 2018-10-07 NOTE — Patient Outreach (Signed)
  [facility]) Care Management Chronic Special Needs Program    10/07/2018  Name: Scott Willis, DOB: 10/24/1940  MRN: [id number]   Mr. Caydyn Sprung is enrolled in a chronic special needs plan for Diabetes. RNCM called to follow up with client regarding talking meter. Client reports he has not received any information on the talking meter. Client reports he will call his primary care regarding the coverage determination form for the talking meter. RNCM also reinforced with client that he can call himself and request a coverage determination to the concierge. He states he will follow up with his provider first then go from there.  Plan: RNCM will follow up in 1-2 months.  Thea Silversmith, RN, MSN, [facility] [phone]

## 2018-10-07 NOTE — Patient Outreach (Signed)
  [facility]) Care Management Chronic Special Needs Program    10/07/2018  Name: Scott Willis, DOB: 09/01/1940  MRN: [id number]   [name] is enrolled in a chronic special needs plan.  Care Coordination-spoke with K. Reudinger, [facility] Pharmacist-reports coverage determination form faxed to primary care office 09/26/2018 for talking meter. Prodigy Autocode is the talking meter that his pharmacy, [facility] supplies.  Plan: RNCM will follow up with client.  Thea Silversmith, RN, MSN, [facility] [phone]

## 2018-10-28 ENCOUNTER — Other Ambulatory Visit: Payer: Self-pay

## 2018-10-28 ENCOUNTER — Encounter (INDEPENDENT_AMBULATORY_CARE_PROVIDER_SITE_OTHER): Payer: HMO | Admitting: Ophthalmology

## 2018-10-28 DIAGNOSIS — H35033 Hypertensive retinopathy, bilateral: Secondary | ICD-10-CM

## 2018-10-28 DIAGNOSIS — H353231 Exudative age-related macular degeneration, bilateral, with active choroidal neovascularization: Secondary | ICD-10-CM

## 2018-10-28 DIAGNOSIS — H43813 Vitreous degeneration, bilateral: Secondary | ICD-10-CM

## 2018-10-28 DIAGNOSIS — I10 Essential (primary) hypertension: Secondary | ICD-10-CM | POA: Diagnosis not present

## 2018-11-10 NOTE — Telephone Encounter (Signed)
This encounter was created in error - please disregard.

## 2018-11-17 ENCOUNTER — Other Ambulatory Visit: Payer: Self-pay

## 2018-11-17 NOTE — Patient Outreach (Signed)
  [facility]) Care Management Chronic Special Needs Program    11/17/2018  Name: Scott Willis, DOB: [birth year]  MRN: [id number]   Scott Willis is enrolled in a chronic special needs plan for Diabetes. RNCM called to follow up. Client reports he is going to call his primary care to schedule a visit for this month and will discuss a talking meter with him at that time.   Client reports discontent with representative that he reports was rude in calling trying to schedule an annual nurse home visit. RNCM relayed the information to the plan.  Client reinforced that he would talk with his provider about the meter. RNCM encouraged client to call as needed.   Plan: RNCM will follow up within 4 months.  Thea Silversmith, RN, MSN, [facility] [phone]

## 2018-11-18 ENCOUNTER — Telehealth: Payer: Self-pay | Admitting: Family Medicine

## 2018-11-18 NOTE — Telephone Encounter (Signed)
Spoke with Patient he advised  [facility] sent a form for Dr Junius Roads to complete back in June and fax back to them for the prodigy auto code meter. Patient said he no longer use [facility] [address]. Patient said he uses [facility] on [address]. The number to contact patient is [phone]

## 2018-11-21 ENCOUNTER — Other Ambulatory Visit: Payer: Self-pay

## 2018-11-21 ENCOUNTER — Other Ambulatory Visit: Payer: Self-pay | Admitting: Family Medicine

## 2018-11-21 DIAGNOSIS — E1165 Type 2 diabetes mellitus with hyperglycemia: Secondary | ICD-10-CM

## 2018-11-21 MED ORDER — VENLAFAXINE HCL 75 MG PO TABS: 75.0000 mg | ORAL_TABLET | Freq: Two times a day (BID) | ORAL | 1 refills | Status: DC

## 2018-11-21 NOTE — Patient Outreach (Signed)
  [facility]) Care Management Chronic Special Needs Program    11/21/2018  Name: Scott Willis, DOB: 1940-08-06  MRN: [id number]   [name] is enrolled in a chronic special needs plan for Diabetes. RNCM received call from [place] at [place] stating the coverage determination form was not received. She request for the form to be re-faxed.    RNCM sent spoke with K. Reudinger, pharmacist who states he will refax the form to [facility]([phone]).  Plan: RNCM will continue to follow.  Thea Silversmith, RN, MSN, [facility] [phone]

## 2018-11-21 NOTE — Telephone Encounter (Signed)
I spoke with both the patient and [place] with [employer] earlier today. We did not receive the faxed form from the pharmacy in June. Denton Brick said she will contact the pharmacy and have them refax it. Will await the form.

## 2018-11-22 ENCOUNTER — Ambulatory Visit (INDEPENDENT_AMBULATORY_CARE_PROVIDER_SITE_OTHER): Payer: HMO

## 2018-11-22 ENCOUNTER — Other Ambulatory Visit: Payer: Self-pay

## 2018-11-22 DIAGNOSIS — E1165 Type 2 diabetes mellitus with hyperglycemia: Secondary | ICD-10-CM

## 2018-11-22 DIAGNOSIS — IMO0002 Reserved for concepts with insufficient information to code with codable children: Secondary | ICD-10-CM

## 2018-11-22 DIAGNOSIS — E1139 Type 2 diabetes mellitus with other diabetic ophthalmic complication: Secondary | ICD-10-CM

## 2018-11-22 NOTE — Progress Notes (Signed)
Hgb A1C drawn. The patient did not want any other blood tests drawn at this time due to the cost.

## 2018-11-23 ENCOUNTER — Telehealth: Payer: Self-pay | Admitting: Family Medicine

## 2018-11-23 LAB — HEMOGLOBIN A1C
Hgb A1c MFr Bld: 6 % of total Hgb — ABNORMAL HIGH (ref <5.7)
Mean Plasma Glucose: 126 (calc)
eAG (mmol/L): 7 (calc)

## 2018-11-23 NOTE — Telephone Encounter (Signed)
A1C looks good at 6.0.  No changes needed.  Recheck in about 6 months.

## 2018-11-23 NOTE — Telephone Encounter (Signed)
I called and advised the patient. 

## 2018-11-24 NOTE — Telephone Encounter (Signed)
New Rx for Prodigy Talking Glucose Meter faxed to [facility]. Copy of the Rx to be scanned in the chart.

## 2018-11-30 ENCOUNTER — Ambulatory Visit: Payer: HMO | Admitting: Podiatry

## 2018-11-30 ENCOUNTER — Encounter: Payer: Self-pay | Admitting: Podiatry

## 2018-11-30 ENCOUNTER — Other Ambulatory Visit: Payer: Self-pay

## 2018-11-30 VITALS — Temp 98.5°F

## 2018-11-30 DIAGNOSIS — M79676 Pain in unspecified toe(s): Secondary | ICD-10-CM

## 2018-11-30 DIAGNOSIS — B351 Tinea unguium: Secondary | ICD-10-CM

## 2018-11-30 DIAGNOSIS — M2141 Flat foot [pes planus] (acquired), right foot: Secondary | ICD-10-CM

## 2018-11-30 DIAGNOSIS — E0843 Diabetes mellitus due to underlying condition with diabetic autonomic (poly)neuropathy: Secondary | ICD-10-CM | POA: Diagnosis not present

## 2018-11-30 DIAGNOSIS — M2142 Flat foot [pes planus] (acquired), left foot: Secondary | ICD-10-CM

## 2018-11-30 NOTE — Progress Notes (Signed)
Complaint:  Visit Type: Patient returns to my office for continued preventative foot care services. Complaint: Patient states" my nails have grown long and thick and become painful to walk and wear shoes" Patient has been diagnosed with DM with neuropathy. The patient presents for preventative foot care services. No changes to ROS  Podiatric Exam: Vascular: dorsalis pedis and posterior tibial pulses are palpable bilateral. Capillary return is immediate. Temperature gradient is WNL. Skin turgor WNL  Sensorium: Diminished/absent Semmes Weinstein monofilament test. Normal tactile sensation bilaterally. Nail Exam: Pt has thick disfigured discolored nails with subungual debris noted bilateral entire nail hallux through fifth toenails Ulcer Exam: There is no evidence of ulcer or pre-ulcerative changes or infection. Orthopedic Exam: Muscle tone and strength are WNL. No limitations in general ROM. No crepitus or effusions noted. Foot type and digits show no abnormalities. Bony prominences are unremarkable. Skin: No Porokeratosis. No infection or ulcers  Diagnosis:  Onychomycosis, , Pain in right toe, pain in left toes,  Diabetes with neuropathy Treatment & Plan Procedures and Treatment: Consent by patient was obtained for treatment procedures.   Debridement of mycotic and hypertrophic toenails, 1 through 5 bilateral and clearing of subungual debris. No ulceration, no infection noted. Patient to make an appointment for diabetic shoes on his own. Return Visit-Office Procedure: Patient instructed to return to the office for a follow up visit 10 weeks  for continued evaluation and treatment.    Gardiner Barefoot DPM

## 2018-12-13 ENCOUNTER — Ambulatory Visit: Payer: HMO | Admitting: Orthotics

## 2018-12-13 ENCOUNTER — Other Ambulatory Visit: Payer: Self-pay

## 2018-12-13 DIAGNOSIS — M2142 Flat foot [pes planus] (acquired), left foot: Secondary | ICD-10-CM

## 2018-12-13 DIAGNOSIS — E0843 Diabetes mellitus due to underlying condition with diabetic autonomic (poly)neuropathy: Secondary | ICD-10-CM

## 2018-12-13 DIAGNOSIS — M2141 Flat foot [pes planus] (acquired), right foot: Secondary | ICD-10-CM

## 2018-12-13 NOTE — Progress Notes (Signed)
Patient hasn't seen PCP since January.  Will see PCP then come back for DBS fitting.

## 2018-12-26 ENCOUNTER — Ambulatory Visit (INDEPENDENT_AMBULATORY_CARE_PROVIDER_SITE_OTHER): Payer: HMO | Admitting: Family Medicine

## 2018-12-26 ENCOUNTER — Encounter: Payer: Self-pay | Admitting: Family Medicine

## 2018-12-26 VITALS — BP 174/70 | HR 66

## 2018-12-26 DIAGNOSIS — R030 Elevated blood-pressure reading, without diagnosis of hypertension: Secondary | ICD-10-CM

## 2018-12-26 DIAGNOSIS — E1140 Type 2 diabetes mellitus with diabetic neuropathy, unspecified: Secondary | ICD-10-CM | POA: Insufficient documentation

## 2018-12-26 DIAGNOSIS — Z720 Tobacco use: Secondary | ICD-10-CM | POA: Diagnosis not present

## 2018-12-26 DIAGNOSIS — E1142 Type 2 diabetes mellitus with diabetic polyneuropathy: Secondary | ICD-10-CM

## 2018-12-26 NOTE — Progress Notes (Signed)
Office Visit Note   Patient: Scott Willis           Date of Birth: Jun 10, 1940           MRN: [id number] Visit Date: 12/26/2018 Requested by: Eunice Blase, MD [address] [place],  [place] [address] PCP: Eunice Blase, MD  Subjective: Chief Complaint  Patient presents with  . foot exam for diabetic shoes    HPI: He is here for routine monitoring.  He needs new diabetic shoes.  His current shoes have never fit properly.  He has diabetic insensate neuropathy.  He has never had an infected ulcer.  He had a home nurse visit recently and his blood pressure was elevated.  He is asymptomatic from that standpoint.  He has not tolerated blood pressure medicines in the past.  He has not had lung cancer screening in quite a while.  He has occasional postnasal drainage related to sinus trauma.  No unintentional weight change.                ROS: No fevers or chills.  All other systems were reviewed and are negative.  Objective: Vital Signs: BP (!) 174/70 (BP Location: Left Arm, Patient Position: Sitting, Cuff Size: Normal)   Pulse 66   Physical Exam:  General:  Alert and oriented, in no acute distress. Pulm:  Breathing unlabored. Psy:  Normal mood, congruent affect. Skin: No hypertrophic calluses, no ulcerations. Feet: He has bilateral pes planus.  He is able to feel monofilament in the fifth toes but he cannot feel it at any other location.  Imaging: None today.  Assessment & Plan: 1.  Diabetic neuropathy with pes planus, at risk for ulceration. -Prescription given for new diabetic shoes.  2.  Hypertension -Recheck blood pressure in 1 month.  Consider trying medication again if still elevated.  3.  Chronic tobacco use -Low-dose CT scan ordered.     Procedures: No procedures performed  No notes on file     PMFS History: Patient Active Problem List   Diagnosis Date Noted  . Diabetic neuropathy ([place]) 12/26/2018  . Paroxysmal atrial fibrillation ([place])  09/30/2018  . Statin intolerance 09/30/2018  . Closed fracture of left side of maxilla with routine healing 04/21/2017  . Depression 09/11/2013  . Hereditary and idiopathic peripheral neuropathy 08/29/2013  . Dysphagia, pharyngoesophageal phase 08/29/2013  . Rotator cuff tear 08/22/2013  . Left shoulder pain 07/31/2013  . Tobacco use disorder 04/19/2013  . Pallor 04/11/2013  . Gastric ulcer 04/11/2013  . Melena 04/11/2013  . Macular degeneration   . Benign essential hypertension   . Hyperlipidemia with target low density lipoprotein (LDL) cholesterol less than 100 mg/dL   . Osteoporosis due to androgen therapy   . Low serum testosterone level   . BPH (benign prostatic hyperplasia)   . DM (diabetes mellitus) type II uncontrolled with eye manifestation ([place])    Past Medical History:  Diagnosis Date  . Arthritis   . Benign essential hypertension   . Blepharitis, squamous    Right Eye   . Blindness   . BPH (benign prostatic hyperplasia)   . DM (diabetes mellitus) type II uncontrolled with eye manifestation ([facility])    fasting 110s  . Hyperlipidemia LDL goal < 100   . Kidney stones   . Low serum testosterone level   . Macular degeneration   . Osteoporosis due to androgen therapy   . Ulcer    gastric    Family History  Problem  Relation Age of Onset  . Alzheimer's disease Mother   . Colon cancer Father   . Diabetes Sister     Past Surgical History:  Procedure Laterality Date  . Minor  . BACK SURGERY  03/2006   Removed Rods Dr.Nitka   . CARPAL TUNNEL RELEASE  08/2006  . CARPAL TUNNEL RELEASE  08/2006   Dr.Nitka   . CATARACT EXTRACTION  02/21/2007   Dr.Epps   . EYE SURGERY  2013   Right eye every 8 weeks   . KIDNEY STONE SURGERY  08/11/2007   Dr.Tannenbaum   . LITHOTRIPSY  08/11/2007   Dr.Nesi  . REFRACTIVE SURGERY  09/2005   Macular Degeneration Dr.Mathews   . 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 Smoking status: Current Every Day Smoker    Packs/day: 1.50    Years: 59.00    Pack years: 88.50    Types: Cigarettes  . Smokeless tobacco: Never Used  Substance and Sexual Activity  . Alcohol use: No    Alcohol/week: 0.0 standard drinks  . Drug use: No  . Sexual activity: Not on file

## 2019-01-20 ENCOUNTER — Other Ambulatory Visit: Payer: Self-pay

## 2019-01-20 ENCOUNTER — Telehealth: Payer: Self-pay | Admitting: Family Medicine

## 2019-01-20 ENCOUNTER — Encounter (INDEPENDENT_AMBULATORY_CARE_PROVIDER_SITE_OTHER): Payer: HMO | Admitting: Ophthalmology

## 2019-01-20 NOTE — Telephone Encounter (Signed)
Patient would like to talk to [place]. His call back number is [phone]

## 2019-01-24 ENCOUNTER — Telehealth: Payer: Self-pay | Admitting: Family Medicine

## 2019-01-24 NOTE — Telephone Encounter (Signed)
I spoke with the patient. He will come in tomorrow for his blood pressure recheck. He did not have the CT chest - does not like to go over to [facility] and he did not have the money for the test at this time.

## 2019-01-24 NOTE — Telephone Encounter (Signed)
Patient called asked for a call back concerning his appointment tomorrow. Patient wanted to know if he even need to come to the appointment tomorrow. Patient also asked for a call back because he did not have the MRI. The number to contact patient is [phone]

## 2019-01-24 NOTE — Telephone Encounter (Signed)
I spoke with the patient - see other message from 01/24/19.

## 2019-01-25 ENCOUNTER — Encounter: Payer: Self-pay | Admitting: Family Medicine

## 2019-01-25 ENCOUNTER — Ambulatory Visit (INDEPENDENT_AMBULATORY_CARE_PROVIDER_SITE_OTHER): Payer: HMO | Admitting: Family Medicine

## 2019-01-25 ENCOUNTER — Other Ambulatory Visit: Payer: Self-pay

## 2019-01-25 VITALS — BP 182/68 | HR 60

## 2019-01-25 DIAGNOSIS — M25559 Pain in unspecified hip: Secondary | ICD-10-CM | POA: Diagnosis not present

## 2019-01-25 DIAGNOSIS — E1142 Type 2 diabetes mellitus with diabetic polyneuropathy: Secondary | ICD-10-CM

## 2019-01-25 DIAGNOSIS — R5383 Other fatigue: Secondary | ICD-10-CM | POA: Diagnosis not present

## 2019-01-25 DIAGNOSIS — E559 Vitamin D deficiency, unspecified: Secondary | ICD-10-CM

## 2019-01-25 DIAGNOSIS — R030 Elevated blood-pressure reading, without diagnosis of hypertension: Secondary | ICD-10-CM | POA: Diagnosis not present

## 2019-01-25 MED ORDER — MAGNESIUM 400 MG PO CAPS: 400.0000 mg | ORAL_CAPSULE | Freq: Every day | ORAL | 3 refills | Status: DC

## 2019-01-25 NOTE — Progress Notes (Signed)
Office Visit Note   Patient: Scott Willis           Date of Birth: 05-01-1940           MRN: [id number] Visit Date: 01/25/2019 Requested by: Eunice Blase, MD [address] [place],  [place] [address] PCP: Eunice Blase, MD  Subjective: Chief Complaint  Patient presents with  . Blood Pressure Check    HPI: He is here for follow-up blood pressure elevation.  He had elevated pressure at his eye doctor last week as well.  He is asymptomatic, no headaches, vision change, chest pain or peripheral edema.  His diet has remained constant for years.  He has venlafaxine to take for neuropathy pain but he has not used any in the past couple weeks.  He takes it as needed.  Incidentally he states that a couple years ago he had a MRSA infection on his right lateral hip.  Ever since then he has had a scab that has been tender when he tries to sleep on his right side.  He was concerned that this could be affecting his energy.  Regarding his energy, he finds himself tired a lot throughout the day.  He is not as productive as he wants to be.  Labs last winter showed severe vitamin D deficiency, moderate B12 deficiency.  He is taking supplements for those.               ROS: No fevers or chills.  All other systems were reviewed and are negative.  Objective: Vital Signs: BP (!) 182/68 (BP Location: Right Arm, Patient Position: Sitting, Cuff Size: Normal)   Pulse 60   Physical Exam:  General:  Alert and oriented, in no acute distress. Pulm:  Breathing unlabored. Psy:  Normal mood, congruent affect. Skin: Right posterior lateral hip: Near the greater trochanter he has a small scab which was removed.  Underneath there is no ulceration, no drainage, no warmth or erythema.  He does have some scar tissue there. Neck: 2+ carotid pulses with no audible bruits today. CV: Regular rate and rhythm without murmurs, rubs, or gallops.  No peripheral edema.      Imaging: None today.  Assessment &  Plan: 1.  Probable hypertension -Patient is very reluctant to treat this with prescription since he did not tolerate 3 drugs in the past.  We will try magnesium 400 mg daily.  Follow-up in 1 month.  Consider medication at that point if not improving.  2.  Fatigue, etiology uncertain. -Patient did not want any additional labs today because his insurance did not cover all of them last time.  3.  Right hip pain -He will try Vaseline and a Band-Aid over the scar.  To see if it will soften.     Procedures: No procedures performed  No notes on file     PMFS History: Patient Active Problem List   Diagnosis Date Noted  . Diabetic neuropathy ([place]) 12/26/2018  . Paroxysmal atrial fibrillation ([place]) 09/30/2018  . Statin intolerance 09/30/2018  . Closed fracture of left side of maxilla with routine healing 04/21/2017  . Depression 09/11/2013  . Hereditary and idiopathic peripheral neuropathy 08/29/2013  . Dysphagia, pharyngoesophageal phase 08/29/2013  . Rotator cuff tear 08/22/2013  . Left shoulder pain 07/31/2013  . Tobacco use disorder 04/19/2013  . Pallor 04/11/2013  . Gastric ulcer 04/11/2013  . Melena 04/11/2013  . Macular degeneration   . Benign essential hypertension   . Hyperlipidemia with target low  density lipoprotein (LDL) cholesterol less than 100 mg/dL   . Osteoporosis due to androgen therapy   . Low serum testosterone level   . BPH (benign prostatic hyperplasia)   . DM (diabetes mellitus) type II uncontrolled with eye manifestation ([place])    Past Medical History:  Diagnosis Date  . Arthritis   . Benign essential hypertension   . Blepharitis, squamous    Right Eye   . Blindness   . BPH (benign prostatic hyperplasia)   . DM (diabetes mellitus) type II uncontrolled with eye manifestation ([facility])    fasting 110s  . Hyperlipidemia LDL goal < 100   . Kidney stones   . Low serum testosterone level   . Macular degeneration   . Osteoporosis due to androgen therapy   .  Ulcer    gastric    Family History  Problem Relation Age of Onset  . Alzheimer's disease Mother   . Colon cancer Father   . Diabetes Sister     Past Surgical History:  Procedure Laterality Date  . [place]  . BACK SURGERY  03/2006   Removed Rods Dr.Nitka   . CARPAL TUNNEL RELEASE  08/2006  . CARPAL TUNNEL RELEASE  08/2006   Dr.Nitka   . CATARACT EXTRACTION  02/21/2007   Dr.Epps   . EYE SURGERY  2013   Right eye every 8 weeks   . KIDNEY STONE SURGERY  08/11/2007   Dr.Tannenbaum   . LITHOTRIPSY  08/11/2007   Dr.Nesi  . REFRACTIVE SURGERY  09/2005   Macular Degeneration Dr.Mathews   . 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 Smoking status: Current Every Day Smoker    Packs/day: 1.50    Years: 59.00    Pack years: 88.50    Types: Cigarettes  . Smokeless tobacco: Never Used  Substance and Sexual Activity  . Alcohol use: No    Alcohol/week: 0.0 standard drinks  . Drug use: No  . Sexual activity: Not on file

## 2019-02-01 ENCOUNTER — Telehealth: Payer: Self-pay | Admitting: Family Medicine

## 2019-02-01 NOTE — Telephone Encounter (Signed)
Bid or more often/less?

## 2019-02-01 NOTE — Telephone Encounter (Signed)
Scott Willis with [facility] called. He would like to know how often patient is using the test strips. His call back number is [phone]

## 2019-02-01 NOTE — Telephone Encounter (Signed)
I left this information on Jeremy's "secure voice mail."

## 2019-02-01 NOTE — Telephone Encounter (Signed)
BID

## 2019-02-08 ENCOUNTER — Encounter: Payer: Self-pay | Admitting: Podiatry

## 2019-02-08 ENCOUNTER — Other Ambulatory Visit: Payer: Self-pay

## 2019-02-08 ENCOUNTER — Telehealth: Payer: Self-pay | Admitting: Family Medicine

## 2019-02-08 ENCOUNTER — Ambulatory Visit: Payer: HMO | Admitting: Orthotics

## 2019-02-08 ENCOUNTER — Ambulatory Visit: Payer: HMO | Admitting: Podiatry

## 2019-02-08 DIAGNOSIS — E0843 Diabetes mellitus due to underlying condition with diabetic autonomic (poly)neuropathy: Secondary | ICD-10-CM

## 2019-02-08 DIAGNOSIS — M2142 Flat foot [pes planus] (acquired), left foot: Secondary | ICD-10-CM

## 2019-02-08 DIAGNOSIS — M79676 Pain in unspecified toe(s): Secondary | ICD-10-CM | POA: Diagnosis not present

## 2019-02-08 DIAGNOSIS — B351 Tinea unguium: Secondary | ICD-10-CM

## 2019-02-08 DIAGNOSIS — M2141 Flat foot [pes planus] (acquired), right foot: Secondary | ICD-10-CM

## 2019-02-08 NOTE — Telephone Encounter (Signed)
Received message from [place] from [facility] needing Rx for diabetic supplies. The number to contact Janett Billow is [phone]

## 2019-02-08 NOTE — Telephone Encounter (Signed)
I called and spoke with Scott Willis.: she will leave a message for Scott Willis to give me a call back with clarification on the diabetic supplies Rx -- does it need to be a separate Rx for supplies or will the script we faxed on 10/15 for the glucometer + supplies suffice?

## 2019-02-08 NOTE — Telephone Encounter (Signed)
I called and spoke with the patient: his insurance requires a separate Rx for the supplies - the Rx we faxed to [facility] on 01/26/2019 had both the glucometer and the supplies on the same Rx. Will write a new Rx for just the supplies (90-day supply, testing bid) and fax it to the pharmacy.

## 2019-02-08 NOTE — Progress Notes (Signed)
Complaint:  Visit Type: Patient returns to my office for continued preventative foot care services. Complaint: Patient states" my nails have grown long and thick and become painful to walk and wear shoes" Patient has been diagnosed with DM with neuropathy. The patient presents for preventative foot care services. No changes to ROS  Podiatric Exam: Vascular: dorsalis pedis and posterior tibial pulses are palpable bilateral. Capillary return is immediate. Temperature gradient is WNL. Skin turgor WNL  Sensorium: Diminished/absent Semmes Weinstein monofilament test. Normal tactile sensation bilaterally. Nail Exam: Pt has thick disfigured discolored nails with subungual debris noted bilateral entire nail hallux through fifth toenails Ulcer Exam: There is no evidence of ulcer or pre-ulcerative changes or infection. Orthopedic Exam: Muscle tone and strength are WNL. No limitations in general ROM. No crepitus or effusions noted. Foot type and digits show no abnormalities. Bony prominences are unremarkable. Skin: No Porokeratosis. No infection or ulcers  Diagnosis:  Onychomycosis, , Pain in right toe, pain in left toes,  Diabetes with neuropathy Treatment & Plan Procedures and Treatment: Consent by patient was obtained for treatment procedures.   Debridement of mycotic and hypertrophic toenails, 1 through 5 bilateral and clearing of subungual debris. No ulceration, no infection noted. Patient to make an  Appointment with Liliane Channel. Return Visit-Office Procedure: Patient instructed to return to the office for a follow up visit 10 weeks  for continued evaluation and treatment.    Gardiner Barefoot DPM

## 2019-02-08 NOTE — Progress Notes (Signed)
Patient left not happy; basically, he was argumentative about the service he has received at [facility] previously;  Apparently, he had received diabetic shoes from [employer] in the past and they were able to stretch/make modifications that we aren't.  His argument that we couldn't do that because we are owned by [facility].  I suggested he try someone like [employer] that could provide shoes as well as make modifications; I wasn't going to argue with him.

## 2019-02-08 NOTE — Telephone Encounter (Signed)
Patient left a message requesting an RX for supplies for his glucometer.  CB#[phone].  Thank you.

## 2019-02-09 ENCOUNTER — Telehealth: Payer: Self-pay | Admitting: Radiology

## 2019-02-09 NOTE — Telephone Encounter (Signed)
Received voicemail from [place] with [employer]. Rx was received for prodigy glucometer and supplies but Scott Willis is having a hard time reading prescription. She states that there are no directions and she needs the frequency of testing and if it is okay for a 90 day supply. She also needs information on who prescribed.   Please call [phone] and give reference number [id number].  You can speak to anyone who answers.

## 2019-02-09 NOTE — Telephone Encounter (Signed)
See other message on this from today - spoke with Loma Sousa with [employer].

## 2019-02-09 NOTE — Telephone Encounter (Signed)
I called [employer] and spoke with Loma Sousa: the patient is to test his blood sugar level bid, to receive lancets and test strips for 90-day supply + 3 refills (or #200 of each/3 refills). He needs the kit that does contain the lancing device. I clarified Dr. Junius Roads' name and NPI number.

## 2019-02-22 ENCOUNTER — Ambulatory Visit (INDEPENDENT_AMBULATORY_CARE_PROVIDER_SITE_OTHER): Payer: HMO | Admitting: Family Medicine

## 2019-02-22 ENCOUNTER — Encounter: Payer: Self-pay | Admitting: Family Medicine

## 2019-02-22 ENCOUNTER — Other Ambulatory Visit: Payer: Self-pay

## 2019-02-22 VITALS — BP 143/66 | HR 68

## 2019-02-22 DIAGNOSIS — R030 Elevated blood-pressure reading, without diagnosis of hypertension: Secondary | ICD-10-CM

## 2019-02-22 DIAGNOSIS — L989 Disorder of the skin and subcutaneous tissue, unspecified: Secondary | ICD-10-CM | POA: Diagnosis not present

## 2019-02-22 DIAGNOSIS — E1142 Type 2 diabetes mellitus with diabetic polyneuropathy: Secondary | ICD-10-CM

## 2019-02-22 NOTE — Progress Notes (Signed)
Office Visit Note   Patient: Scott Willis           Date of Birth: 05/13/1940           MRN: [id number] Visit Date: 02/22/2019 Requested by: Eunice Blase, MD [address] [place],  [place] [address] PCP: Eunice Blase, MD  Subjective: Chief Complaint  Patient presents with  . Blood Pressure Check    HPI: He is here for follow-up elevated blood pressure.  Since starting magnesium, his blood pressure readings have been much better at home.  His bowel movements are more regular as well.  He feels good today overall.  He has a skin lesion on his forehead, left side, that has been present since he was a youth but seems to be getting a little bit bigger.  Blood sugars have been below 200 fasting and after meals.                ROS: No fever or chills.  All other systems were reviewed and are negative.  Objective: Vital Signs: BP (!) 143/66 (BP Location: Left Arm, Patient Position: Sitting, Cuff Size: Normal)   Pulse 68   Physical Exam:  General:  Alert and oriented, in no acute distress. Pulm:  Breathing unlabored. Psy:  Normal mood, congruent affect. Skin: There is a seborrheic keratosis above his left eyebrow.  It is probably 2 cm diameter. CV: Regular rate and rhythm without murmurs, rubs, or gallops.  No peripheral edema.  2+ radial and posterior tibial pulses. Lungs: Clear to auscultation throughout with no wheezing or areas of consolidation.    Imaging: None today.  Assessment & Plan: 1.   Blood pressure elevation, now much better controlled. -Continue with magnesium.  Follow-up in about 3 months.  2.  Seborrheic keratosis of forehead -Reassurance, dermatology referral if he desires treatment.  3.  Diabetes -Labs in about 3 months.     Procedures: No procedures performed  No notes on file     PMFS History: Patient Active Problem List   Diagnosis Date Noted  . Diabetic neuropathy ([place]) 12/26/2018  . Paroxysmal atrial fibrillation ([place])  09/30/2018  . Statin intolerance 09/30/2018  . Closed fracture of left side of maxilla with routine healing 04/21/2017  . Depression 09/11/2013  . Hereditary and idiopathic peripheral neuropathy 08/29/2013  . Dysphagia, pharyngoesophageal phase 08/29/2013  . Rotator cuff tear 08/22/2013  . Left shoulder pain 07/31/2013  . Tobacco use disorder 04/19/2013  . Pallor 04/11/2013  . Gastric ulcer 04/11/2013  . Melena 04/11/2013  . Macular degeneration   . Benign essential hypertension   . Hyperlipidemia with target low density lipoprotein (LDL) cholesterol less than 100 mg/dL   . Osteoporosis due to androgen therapy   . Low serum testosterone level   . BPH (benign prostatic hyperplasia)   . DM (diabetes mellitus) type II uncontrolled with eye manifestation ([place])    Past Medical History:  Diagnosis Date  . Arthritis   . Benign essential hypertension   . Blepharitis, squamous    Right Eye   . Blindness   . BPH (benign prostatic hyperplasia)   . DM (diabetes mellitus) type II uncontrolled with eye manifestation ([facility])    fasting 110s  . Hyperlipidemia LDL goal < 100   . Kidney stones   . Low serum testosterone level   . Macular degeneration   . Osteoporosis due to androgen therapy   . Ulcer    gastric    Family History  Problem Relation Age  of Onset  . Alzheimer's disease Mother   . Colon cancer Father   . Diabetes Sister     Past Surgical History:  Procedure Laterality Date  . [place]  . BACK SURGERY  03/2006   Removed Rods Dr.Nitka   . CARPAL TUNNEL RELEASE  08/2006  . CARPAL TUNNEL RELEASE  08/2006   Dr.Nitka   . CATARACT EXTRACTION  02/21/2007   Dr.Epps   . EYE SURGERY  2013   Right eye every 8 weeks   . KIDNEY STONE SURGERY  08/11/2007   Dr.Tannenbaum   . LITHOTRIPSY  08/11/2007   Dr.Nesi  . REFRACTIVE SURGERY  09/2005   Macular Degeneration Dr.Mathews   . 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 Smoking status: Current Every Day Smoker    Packs/day: 1.50    Years: 59.00    Pack years: 88.50    Types: Cigarettes  . Smokeless tobacco: Never Used  Substance and Sexual Activity  . Alcohol use: No    Alcohol/week: 0.0 standard drinks  . Drug use: No  . Sexual activity: Not on file

## 2019-02-24 ENCOUNTER — Telehealth: Payer: Self-pay | Admitting: Podiatry

## 2019-02-24 NOTE — Telephone Encounter (Signed)
Pt left message stating he is needing a rx for diabetic shoes/inserts per his insurance company([employer]) since he had a problem with our office at the last visit.   I spoke to patient and he said he did not file a grievance due to the last situation with [facility] did and they told  Him he need a rx from Dr Prudence Davidson.  Pt then said he wanted a call from Dr Prudence Davidson.

## 2019-02-28 ENCOUNTER — Other Ambulatory Visit: Payer: Self-pay

## 2019-02-28 NOTE — Patient Outreach (Signed)
  [facility]) Care Management Chronic Special Needs Program  02/28/2019  Name: RHYS DEJONGH DOB: 1940-08-27  MRN: [id number]  Mr. Herve Bitting is enrolled in a chronic special needs plan for Diabetes. Reviewed and updated care plan.  Subjective: Client reports he is doing well. He reports he has a talking meter and states his blood sugar was 131 this morning. Last A1C was 6.0 on 11/22/2018. Client reports he was having difficulty with increased blood pressure, but reports it has come down. He states he does not have a blood pressure monitor. He denies any additional issues at this time.   Goals Addressed            This Visit's Progress   . COMPLETED:  Acknowledge receipt of Advanced Directive package       Voiced receipt of packet.    . COMPLETED: Advanced Care Planning completed per client directive within the next 6-9 months.       To be completed at client's leisure.    . COMPLETED: Client understands the importance of follow-up with providers by attending scheduled visits       Voiced understanding of importance of attending provider visits.    . COMPLETED: Client will report abillity to obtain Medications within the next 3-6 months.       Reports ability to obtain medications.    . COMPLETED: Client will use Assistive Devices as needed and verbalize understanding of device use       Denies any issues with use of glucometer.    . COMPLETED: Diabetes Patient stated goal: to get A1C down. (pt-stated)       Voiced A1C 6.0    . COMPLETED: HEMOGLOBIN A1C < 7       04/22/2018 Hemoglobin A1C 6.2  11/22/2018 Hemoglobin A1C 6.0    . COMPLETED: Maintain timely refills of diabetic medication as prescribed within the year .       Voiced no difficulty obtaining medications    . Obtain annual  Lipid Profile, LDL-C   On track   . Obtain Annual Eye (retinal)  Exam    On track   . COMPLETED: Obtain Annual Foot Exam       Voiced foot exam completed.    .  Obtain annual screen for micro albuminuria (urine) , nephropathy (kidney problems)   On track   . COMPLETED: Obtain Hemoglobin A1C at least 2 times per year       done 04/22/2018 and 11/22/2018    . Quit Smoking   On track   . COMPLETED: Visit Primary Care Provider or Endocrinologist at least 2 times per year        Provider seen at least twice/year      Covid 19 precautions discussed. RNCM encouraged client to call as needed.  Plan: RNCM will send updated care plan to client; send updated care plan to primary care provider. Send smoking cessation material. RNCM will follow up per tier level within 6 months.   Thea Silversmith, RN, MSN, [place] [phone]    .

## 2019-03-24 ENCOUNTER — Encounter (INDEPENDENT_AMBULATORY_CARE_PROVIDER_SITE_OTHER): Payer: HMO | Admitting: Ophthalmology

## 2019-03-24 ENCOUNTER — Other Ambulatory Visit: Payer: Self-pay

## 2019-03-24 DIAGNOSIS — H43813 Vitreous degeneration, bilateral: Secondary | ICD-10-CM

## 2019-03-24 DIAGNOSIS — I10 Essential (primary) hypertension: Secondary | ICD-10-CM | POA: Diagnosis not present

## 2019-03-24 DIAGNOSIS — H35033 Hypertensive retinopathy, bilateral: Secondary | ICD-10-CM

## 2019-03-24 DIAGNOSIS — H353231 Exudative age-related macular degeneration, bilateral, with active choroidal neovascularization: Secondary | ICD-10-CM

## 2019-04-24 ENCOUNTER — Telehealth: Payer: Self-pay | Admitting: Family Medicine

## 2019-04-24 NOTE — Telephone Encounter (Signed)
Patient called. He would like advise on taking the COVID vaccine. His call back number is [phone]

## 2019-04-24 NOTE — Telephone Encounter (Signed)
I called and advised the patient of this. 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 Scott Willis just wanted to know Dr. Junius Roads' opinion, as well.

## 2019-04-24 NOTE — Telephone Encounter (Signed)
Please advise 

## 2019-04-26 ENCOUNTER — Ambulatory Visit: Payer: HMO | Admitting: Podiatry

## 2019-05-24 ENCOUNTER — Encounter: Payer: Self-pay | Admitting: Family Medicine

## 2019-05-24 ENCOUNTER — Ambulatory Visit (INDEPENDENT_AMBULATORY_CARE_PROVIDER_SITE_OTHER): Payer: HMO | Admitting: Family Medicine

## 2019-05-24 ENCOUNTER — Other Ambulatory Visit: Payer: Self-pay

## 2019-05-24 VITALS — BP 176/68 | HR 62

## 2019-05-24 DIAGNOSIS — I10 Essential (primary) hypertension: Secondary | ICD-10-CM

## 2019-05-24 DIAGNOSIS — F17200 Nicotine dependence, unspecified, uncomplicated: Secondary | ICD-10-CM

## 2019-05-24 DIAGNOSIS — R0981 Nasal congestion: Secondary | ICD-10-CM | POA: Diagnosis not present

## 2019-05-24 DIAGNOSIS — E1142 Type 2 diabetes mellitus with diabetic polyneuropathy: Secondary | ICD-10-CM | POA: Diagnosis not present

## 2019-05-24 DIAGNOSIS — E1165 Type 2 diabetes mellitus with hyperglycemia: Secondary | ICD-10-CM

## 2019-05-24 DIAGNOSIS — E1139 Type 2 diabetes mellitus with other diabetic ophthalmic complication: Secondary | ICD-10-CM

## 2019-05-24 DIAGNOSIS — E559 Vitamin D deficiency, unspecified: Secondary | ICD-10-CM | POA: Insufficient documentation

## 2019-05-24 DIAGNOSIS — N400 Enlarged prostate without lower urinary tract symptoms: Secondary | ICD-10-CM | POA: Diagnosis not present

## 2019-05-24 DIAGNOSIS — E785 Hyperlipidemia, unspecified: Secondary | ICD-10-CM

## 2019-05-24 DIAGNOSIS — IMO0002 Reserved for concepts with insufficient information to code with codable children: Secondary | ICD-10-CM

## 2019-05-24 MED ORDER — OXYCODONE-ACETAMINOPHEN 5-325 MG PO TABS: 1.0000 | ORAL_TABLET | Freq: Four times a day (QID) | ORAL | 0 refills | Status: DC | PRN

## 2019-05-24 NOTE — Progress Notes (Signed)
Office Visit Note   Patient: Scott Willis           Date of Birth: 1941/01/10           MRN: [id number] Visit Date: 05/24/2019 Requested by: Eunice Blase, MD [address] [place],  [place] [address] PCP: Eunice Blase, MD  Subjective: Chief Complaint  Patient presents with  . 3 months follow up for diabetes/hypertension    HPI: He is here for monitoring of medical conditions.  From a diabetes standpoint he does not check his blood sugars regularly.  He is on Januvia.  No side effects with the medication.  He is monitored regularly by his eye doctor and is having a surgery on Friday.  Denies any lesions on his feet.  From a blood pressure standpoint he is on magnesium.  He does not check his blood pressures at home.  Denies any chest pain, headaches, palpitations.  He complains of chronic left-sided sinus congestion since falling and fracturing his facial bones.  He has problems with his left tear duct as well.  He is not sure that he wants to have anything done about this but wanted to make me aware of it.  He has BPH managed with Proscar and Flomax.  He has a history of vitamin D deficiency.  He has chronic back pain and uses oxycodone about once every couple months.  He is about due for a refill.                ROS: No fevers or chills.  All other systems were reviewed and are negative.  Objective: Vital Signs: BP (!) 176/68   Pulse 62   Physical Exam:  General:  Alert and oriented, in no acute distress. Pulm:  Breathing unlabored. Psy:  Normal mood, congruent affect. Skin: No suspicious lesions noted. Neck: No thyromegaly, no carotid bruits. CV: Regular rate and rhythm without murmurs, rubs, or gallops.  No peripheral edema.  2+ radial and posterior tibial pulses. Lungs: Clear to auscultation throughout with no wheezing or areas of consolidation.    Imaging: None today  Assessment & Plan: 1.  Diabetes -Labs to evaluate.  Follow-up in about 3 months.  2.  Hypertension suboptimally controlled today -We will continue to monitor.  He has not tolerated some medications but if his blood pressure remains elevated, we may need to try again.  3.  Hyperlipidemia, does not tolerate statins.  4.  BPH, stable.  5.  Vitamin D deficiency -Recheck levels today.  6.  Chronic left-sided head congestion -At this point he is not sure whether he would want anything done from a surgical standpoint.  If he changes his mind, he will let me know and I will order a limited CT scan of his sinuses followed by ENT consult.     Procedures: No procedures performed  No notes on file     PMFS History: Patient Active Problem List   Diagnosis Date Noted  . Vitamin D deficiency 05/24/2019  . Diabetic neuropathy ([place]) 12/26/2018  . Paroxysmal atrial fibrillation ([place]) 09/30/2018  . Statin intolerance 09/30/2018  . Closed fracture of left side of maxilla with routine healing 04/21/2017  . Depression 09/11/2013  . Hereditary and idiopathic peripheral neuropathy 08/29/2013  . Dysphagia, pharyngoesophageal phase 08/29/2013  . Rotator cuff tear 08/22/2013  . Left shoulder pain 07/31/2013  . Tobacco use disorder 04/19/2013  . Pallor 04/11/2013  . Gastric ulcer 04/11/2013  . Melena 04/11/2013  . Macular degeneration   .  Benign essential hypertension   . Hyperlipidemia with target low density lipoprotein (LDL) cholesterol less than 100 mg/dL   . Osteoporosis due to androgen therapy   . Low serum testosterone level   . BPH (benign prostatic hyperplasia)   . DM (diabetes mellitus) type II uncontrolled with eye manifestation ([place])    Past Medical History:  Diagnosis Date  . Arthritis   . Benign essential hypertension   . Blepharitis, squamous    Right Eye   . Blindness   . BPH (benign prostatic hyperplasia)   . DM (diabetes mellitus) type II uncontrolled with eye manifestation ([facility])    fasting 110s  . Hyperlipidemia LDL goal < 100   . Kidney stones    . Low serum testosterone level   . Macular degeneration   . Osteoporosis due to androgen therapy   . Ulcer    gastric    Family History  Problem Relation Age of Onset  . Alzheimer's disease Mother   . Colon cancer Father   . Diabetes Sister     Past Surgical History:  Procedure Laterality Date  . [place]  . BACK SURGERY  03/2006   Removed Rods Dr.Nitka   . CARPAL TUNNEL RELEASE  08/2006  . CARPAL TUNNEL RELEASE  08/2006   Dr.Nitka   . CATARACT EXTRACTION  02/21/2007   Dr.Epps   . EYE SURGERY  2013   Right eye every 8 weeks   . KIDNEY STONE SURGERY  08/11/2007   Dr.Tannenbaum   . LITHOTRIPSY  08/11/2007   Dr.Nesi  . REFRACTIVE SURGERY  09/2005   Macular Degeneration Dr.Mathews   . 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 Smoking status: Current Every Day Smoker    Packs/day: 1.50    Years: 59.00    Pack years: 88.50    Types: Cigarettes  . Smokeless tobacco: Never Used  Substance and Sexual Activity  . Alcohol use: No    Alcohol/week: 0.0 standard drinks  . Drug use: No  . Sexual activity: Not on file

## 2019-05-25 ENCOUNTER — Telehealth: Payer: Self-pay | Admitting: Family Medicine

## 2019-05-25 LAB — COMPREHENSIVE METABOLIC PANEL
AG Ratio: 1.4 (calc) (ref 1.0–2.5)
ALT: 12 U/L (ref 9–46)
AST: 15 U/L (ref 10–35)
Albumin: 4 g/dL (ref 3.6–5.1)
Alkaline phosphatase (APISO): 44 U/L (ref 35–144)
BUN/Creatinine Ratio: 20 (calc) (ref 6–22)
BUN: 27 mg/dL — ABNORMAL HIGH (ref 7–25)
CO2: 30 mmol/L (ref 20–32)
Calcium: 9 mg/dL (ref 8.6–10.3)
Chloride: 104 mmol/L (ref 98–110)
Creat: 1.33 mg/dL — ABNORMAL HIGH (ref 0.70–1.18)
Globulin: 2.8 g/dL (calc) (ref 1.9–3.7)
Glucose, Bld: 83 mg/dL (ref 65–99)
Potassium: 4.9 mmol/L (ref 3.5–5.3)
Sodium: 140 mmol/L (ref 135–146)
Total Bilirubin: 0.5 mg/dL (ref 0.2–1.2)
Total Protein: 6.8 g/dL (ref 6.1–8.1)

## 2019-05-25 LAB — CBC WITH DIFFERENTIAL/PLATELET
Absolute Monocytes: 791 cells/uL (ref 200–950)
Basophils Absolute: 69 cells/uL (ref 0–200)
Basophils Relative: 0.8 %
Eosinophils Absolute: 507 cells/uL — ABNORMAL HIGH (ref 15–500)
Eosinophils Relative: 5.9 %
HCT: 39.3 % (ref 38.5–50.0)
Hemoglobin: 13.3 g/dL (ref 13.2–17.1)
Lymphs Abs: 2124 cells/uL (ref 850–3900)
MCH: 31.6 pg (ref 27.0–33.0)
MCHC: 33.8 g/dL (ref 32.0–36.0)
MCV: 93.3 fL (ref 80.0–100.0)
MPV: 12.9 fL — ABNORMAL HIGH (ref 7.5–12.5)
Monocytes Relative: 9.2 %
Neutro Abs: 5108 cells/uL (ref 1500–7800)
Neutrophils Relative %: 59.4 %
Platelets: 175 10*3/uL (ref 140–400)
RBC: 4.21 10*6/uL (ref 4.20–5.80)
RDW: 13 % (ref 11.0–15.0)
Total Lymphocyte: 24.7 %
WBC: 8.6 10*3/uL (ref 3.8–10.8)

## 2019-05-25 LAB — MICROALBUMIN, URINE: Microalb, Ur: 0.2 mg/dL

## 2019-05-25 LAB — HEMOGLOBIN A1C
Hgb A1c MFr Bld: 6.6 % of total Hgb — ABNORMAL HIGH (ref <5.7)
Mean Plasma Glucose: 143 (calc)
eAG (mmol/L): 7.9 (calc)

## 2019-05-25 LAB — PSA: PSA: 0.9 ng/mL (ref <=4.0)

## 2019-05-25 LAB — LIPID PANEL
Cholesterol: 165 mg/dL (ref <200)
HDL: 53 mg/dL (ref >=40)
LDL Cholesterol (Calc): 93 mg/dL (calc)
Non-HDL Cholesterol (Calc): 112 mg/dL (calc) (ref <130)
Total CHOL/HDL Ratio: 3.1 (calc) (ref <5.0)
Triglycerides: 95 mg/dL (ref <150)

## 2019-05-25 LAB — VITAMIN D 25 HYDROXY (VIT D DEFICIENCY, FRACTURES): Vit D, 25-Hydroxy: 39 ng/mL (ref 30–100)

## 2019-05-25 NOTE — Telephone Encounter (Signed)
A1C is up a bit at 6.6.  All other labs are holding steady and most are looking better.  Recheck labs in about 6 months.

## 2019-05-25 NOTE — Telephone Encounter (Signed)
I called and advised the patient of his results and instructions on diet from Dr. Junius Roads.

## 2019-05-26 ENCOUNTER — Other Ambulatory Visit: Payer: Self-pay

## 2019-05-26 ENCOUNTER — Encounter (INDEPENDENT_AMBULATORY_CARE_PROVIDER_SITE_OTHER): Payer: HMO | Admitting: Ophthalmology

## 2019-05-26 DIAGNOSIS — H43813 Vitreous degeneration, bilateral: Secondary | ICD-10-CM | POA: Diagnosis not present

## 2019-05-26 DIAGNOSIS — H35033 Hypertensive retinopathy, bilateral: Secondary | ICD-10-CM

## 2019-05-26 DIAGNOSIS — H353231 Exudative age-related macular degeneration, bilateral, with active choroidal neovascularization: Secondary | ICD-10-CM | POA: Diagnosis not present

## 2019-05-26 DIAGNOSIS — I10 Essential (primary) hypertension: Secondary | ICD-10-CM

## 2019-06-09 ENCOUNTER — Other Ambulatory Visit: Payer: Self-pay

## 2019-06-09 ENCOUNTER — Encounter: Payer: Self-pay | Admitting: Podiatry

## 2019-06-09 ENCOUNTER — Ambulatory Visit (INDEPENDENT_AMBULATORY_CARE_PROVIDER_SITE_OTHER): Payer: HMO | Admitting: Podiatry

## 2019-06-09 DIAGNOSIS — M2141 Flat foot [pes planus] (acquired), right foot: Secondary | ICD-10-CM

## 2019-06-09 DIAGNOSIS — E0843 Diabetes mellitus due to underlying condition with diabetic autonomic (poly)neuropathy: Secondary | ICD-10-CM | POA: Diagnosis not present

## 2019-06-09 DIAGNOSIS — M79676 Pain in unspecified toe(s): Secondary | ICD-10-CM

## 2019-06-09 DIAGNOSIS — B351 Tinea unguium: Secondary | ICD-10-CM

## 2019-06-09 DIAGNOSIS — M2142 Flat foot [pes planus] (acquired), left foot: Secondary | ICD-10-CM

## 2019-06-09 NOTE — Progress Notes (Signed)
Complaint:  Visit Type: Patient returns to my office for continued preventative foot care services. Complaint: Patient states" my nails have grown long and thick and become painful to walk and wear shoes" Patient has been diagnosed with DM with neuropathy. The patient presents for preventative foot care services. No changes to ROS  Podiatric Exam: Vascular: dorsalis pedis and posterior tibial pulses are palpable bilateral. Capillary return is immediate. Temperature gradient is WNL. Skin turgor WNL  Sensorium: Diminished/absent Semmes Weinstein monofilament test. Normal tactile sensation bilaterally. Nail Exam: Pt has thick disfigured discolored nails with subungual debris noted bilateral entire nail hallux through fifth toenails Ulcer Exam: There is no evidence of ulcer or pre-ulcerative changes or infection. Orthopedic Exam: Muscle tone and strength are WNL. No limitations in general ROM. No crepitus or effusions noted. Foot type and digits show no abnormalities. HAV  B/L. Skin: No Porokeratosis. No infection or ulcers  Diagnosis:  Onychomycosis, , Pain in right toe, pain in left toes,  Diabetes with neuropathy Treatment & Plan Procedures and Treatment: Consent by patient was obtained for treatment procedures.   Debridement of mycotic and hypertrophic toenails, 1 through 5 bilateral and clearing of subungual debris. No ulceration, no infection noted.  Patient to make an appointment with [facility].  Patient qualifies for diabetic shoes due to DPN and HAV  B/L. Return Visit-Office Procedure: Patient instructed to return to the office for a follow up visit 10 weeks  for continued evaluation and treatment.    Gardiner Barefoot DPM

## 2019-06-20 ENCOUNTER — Other Ambulatory Visit: Payer: Self-pay | Admitting: Family Medicine

## 2019-06-20 MED ORDER — VENLAFAXINE HCL 75 MG PO TABS: 75.0000 mg | ORAL_TABLET | Freq: Two times a day (BID) | ORAL | 1 refills | Status: AC

## 2019-06-23 ENCOUNTER — Other Ambulatory Visit: Payer: HMO | Admitting: Orthotics

## 2019-06-23 ENCOUNTER — Other Ambulatory Visit: Payer: Self-pay

## 2019-07-28 ENCOUNTER — Encounter (INDEPENDENT_AMBULATORY_CARE_PROVIDER_SITE_OTHER): Payer: HMO | Admitting: Ophthalmology

## 2019-07-28 DIAGNOSIS — I10 Essential (primary) hypertension: Secondary | ICD-10-CM

## 2019-07-28 DIAGNOSIS — H43813 Vitreous degeneration, bilateral: Secondary | ICD-10-CM

## 2019-07-28 DIAGNOSIS — H35033 Hypertensive retinopathy, bilateral: Secondary | ICD-10-CM | POA: Diagnosis not present

## 2019-07-28 DIAGNOSIS — H353131 Nonexudative age-related macular degeneration, bilateral, early dry stage: Secondary | ICD-10-CM | POA: Diagnosis not present

## 2019-08-01 ENCOUNTER — Other Ambulatory Visit: Payer: Self-pay | Admitting: Family Medicine

## 2019-08-01 MED ORDER — SITAGLIPTIN PHOSPHATE 100 MG PO TABS: 100.0000 mg | ORAL_TABLET | Freq: Every day | ORAL | 1 refills | Status: DC

## 2019-08-15 ENCOUNTER — Other Ambulatory Visit: Payer: Self-pay

## 2019-08-15 NOTE — Patient Outreach (Signed)
  [facility]) Care Management Chronic Special Needs Program    08/15/2019  Name: Scott Willis, DOB: [birth year]  MRN: [id number]   Scott Willis is enrolled in a chronic special needs plan for Diabetes. RNCM called to follow up, review health risk assessment and updated care plan. No answer. RNCM received busy signal. Three attempts made.   Plan: Chronic care management coordinator will attempt outreach within 1-2 weeks.  Thea Silversmith, RN, MSN, [place] [phone]

## 2019-08-21 ENCOUNTER — Other Ambulatory Visit: Payer: Self-pay

## 2019-08-21 ENCOUNTER — Ambulatory Visit (INDEPENDENT_AMBULATORY_CARE_PROVIDER_SITE_OTHER): Payer: HMO | Admitting: Family Medicine

## 2019-08-21 VITALS — BP 157/78 | HR 66 | Wt 153.2 lb

## 2019-08-21 DIAGNOSIS — R41840 Attention and concentration deficit: Secondary | ICD-10-CM | POA: Diagnosis not present

## 2019-08-21 DIAGNOSIS — R748 Abnormal levels of other serum enzymes: Secondary | ICD-10-CM | POA: Diagnosis not present

## 2019-08-21 DIAGNOSIS — R202 Paresthesia of skin: Secondary | ICD-10-CM | POA: Diagnosis not present

## 2019-08-21 DIAGNOSIS — R200 Anesthesia of skin: Secondary | ICD-10-CM | POA: Diagnosis not present

## 2019-08-21 LAB — BASIC METABOLIC PANEL
BUN/Creatinine Ratio: 16 (calc) (ref 6–22)
BUN: 22 mg/dL (ref 7–25)
CO2: 28 mmol/L (ref 20–32)
Calcium: 9.4 mg/dL (ref 8.6–10.3)
Chloride: 103 mmol/L (ref 98–110)
Creat: 1.41 mg/dL — ABNORMAL HIGH (ref 0.70–1.18)
Glucose, Bld: 99 mg/dL (ref 65–99)
Potassium: 4.9 mmol/L (ref 3.5–5.3)
Sodium: 139 mmol/L (ref 135–146)

## 2019-08-21 NOTE — Progress Notes (Signed)
Office Visit Note   Patient: Scott Willis           Date of Birth: Nov 10, 1940           MRN: [id number] Visit Date: 08/21/2019 Requested by: Eunice Blase, MD [address] [place],  [place] [address] PCP: Eunice Blase, MD  Subjective: No chief complaint on file.   HPI: He is here for monitoring of kidney function.  Last time his creatinine was slightly elevated.  Overall he has no major concerns today.  He does note that intermittently his left arm feels numb when he sleeps, but when he wakes up and adjust his neck, the numbness goes away.  He is having some troubles with decreased urinary stream.  His PSA last visit was normal.  He also states that he is sometimes not doing tasks at work that he knows need to be done.  He does not forget them, but he gets busy and at the end of the day realizes that he has not done them.  He does not think he is having memory problems.               ROS:   All other systems were reviewed and are negative.  Objective: Vital Signs: BP (!) 157/78   Pulse 66   Physical Exam:  General:  Alert and oriented, in no acute distress. Pulm:  Breathing unlabored. Psy:  Normal mood, congruent affect.  CV: Regular rate and rhythm without murmurs, rubs, or gallops.  No peripheral edema.  2+ radial and posterior tibial pulses. Lungs: Clear to auscultation throughout with no wheezing or areas of consolidation. Left arm: Negative Tinel's at the carpal tunnel, negative Phalen's test.   Imaging: No results found.  Assessment & Plan: 1.  Elevated creatinine - Recheck today.  - Return in 3 months to monitor DM and other issues.  2.  Left arm numbness, likely due to neck pathology. - Try different pillows at night.  3.  Decreased focus - Try doing tasks immediately while on his mind.     Procedures: No procedures performed  No notes on file     PMFS History: Patient Active Problem List   Diagnosis Date Noted  . Vitamin D deficiency  05/24/2019  . Diabetic neuropathy ([place]) 12/26/2018  . Paroxysmal atrial fibrillation ([place]) 09/30/2018  . Statin intolerance 09/30/2018  . Closed fracture of left side of maxilla with routine healing 04/21/2017  . Depression 09/11/2013  . Hereditary and idiopathic peripheral neuropathy 08/29/2013  . Dysphagia, pharyngoesophageal phase 08/29/2013  . Rotator cuff tear 08/22/2013  . Left shoulder pain 07/31/2013  . Tobacco use disorder 04/19/2013  . Pallor 04/11/2013  . Gastric ulcer 04/11/2013  . Melena 04/11/2013  . Macular degeneration   . Benign essential hypertension   . Hyperlipidemia with target low density lipoprotein (LDL) cholesterol less than 100 mg/dL   . Osteoporosis due to androgen therapy   . Low serum testosterone level   . BPH (benign prostatic hyperplasia)   . DM (diabetes mellitus) type II uncontrolled with eye manifestation ([place])    Past Medical History:  Diagnosis Date  . Arthritis   . Benign essential hypertension   . Blepharitis, squamous    Right Eye   . Blindness   . BPH (benign prostatic hyperplasia)   . DM (diabetes mellitus) type II uncontrolled with eye manifestation ([facility])    fasting 110s  . Hyperlipidemia LDL goal < 100   . Kidney stones   . Low  serum testosterone level   . Macular degeneration   . Osteoporosis due to androgen therapy   . Ulcer    gastric    Family History  Problem Relation Age of Onset  . Alzheimer's disease Mother   . Colon cancer Father   . Diabetes Sister     Past Surgical History:  Procedure Laterality Date  . [place]  . BACK SURGERY  03/2006   Removed Rods Dr.Nitka   . CARPAL TUNNEL RELEASE  08/2006  . CARPAL TUNNEL RELEASE  08/2006   Dr.Nitka   . CATARACT EXTRACTION  02/21/2007   Dr.Epps   . EYE SURGERY  2013   Right eye every 8 weeks   . KIDNEY STONE SURGERY  08/11/2007   Dr.Tannenbaum   . LITHOTRIPSY  08/11/2007   Dr.Nesi  . REFRACTIVE SURGERY  09/2005   Macular Degeneration  Dr.Mathews   . 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 Smoking status: Current Every Day Smoker    Packs/day: 1.50    Years: 59.00    Pack years: 88.50    Types: Cigarettes  . Smokeless tobacco: Never Used  Substance and Sexual Activity  . Alcohol use: No    Alcohol/week: 0.0 standard drinks  . Drug use: No  . Sexual activity: Not on file

## 2019-08-22 ENCOUNTER — Telehealth: Payer: Self-pay | Admitting: Family Medicine

## 2019-08-22 DIAGNOSIS — R748 Abnormal levels of other serum enzymes: Secondary | ICD-10-CM

## 2019-08-22 NOTE — Telephone Encounter (Signed)
Kidney function/creatinine level has gotten worse since last time.  I recommend rechecking in about 6 weeks, nurse visit for labs, and if it is not improving, I will request referral to nephrology.

## 2019-08-22 NOTE — Telephone Encounter (Signed)
I called and advised the patient of the lab results. Scheduled an appointment for a lab only visit 10/02/19 at 8:00.

## 2019-08-24 ENCOUNTER — Ambulatory Visit: Payer: HMO

## 2019-08-28 ENCOUNTER — Other Ambulatory Visit: Payer: Self-pay

## 2019-08-28 NOTE — Patient Outreach (Signed)
[facility]) Care Management Chronic Special Needs Program  08/28/2019  Name: Scott Willis DOB: [birth year]  MRN: [id number]  [name] is enrolled in a chronic special needs plan for Diabetes. Chronic Care Management Coordinator telephoned client to review health risk assessment and update individualized care plan.  RNCM reviewed the chronic care management program, importance of client participation, and taking their care plan to all provider appointments and inpatient facilities.    Subjective: client reports, "doing alright". He states his last A1C was 6.6. He reports he is legally blind due to macular degeneration and is receiving procedures regularly to his left eye. 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 He states, if there is something that he needs to read he is able to enlarge the print on his copier to read. Client states he does not have a good magnifier at this time to help with reading.   Client reports his blood pressure has been up and down. He states he does not have a blood pressure monitor. Per chart he is taking magnesium for blood pressure. However, client is not currently taking magnesium, he states he ran out of magnesium, but will get Magnesium from his pharmacy.  History of smoking-Client decline smoking cessation at this time.    Goals Addressed            This Visit's Progress   . Client will report no worsening of symptoms of Atrial Fibrillation within the next 6-9 months   On track    Please follow up with your provider as scheduled. Please take medications as prescribed. Signs/symptoms of worsening: racing or irregular heart beat that may be uncomfortable shortness of breath with our without chest pain weakness; dizziness. Mailed education: "atrial fibrillation". Please review and call if you have any questions.    . Client will use Assistive Devices as needed and verbalize understanding of  device use (pt-stated)   On track    A blood pressure monitor and a magnifier glass will be mailed to you . Please call if you do not receive within the next 2-3 weeks.    . Client will verbalize knowledge of self management of Hypertension as evidences by BP reading of 140/90 or less; or as defined by provider   On track    It is important to follow up with your provider for recommended labs, procedures and medication refills. Discussed blood pressure and importance of monitoring blood pressure. A Blood Pressure Monitor and a Reading Magnifier glass will be mailed to you. Please call if you do not receive it within the next 2-3 weeks. Please check blood pressure and take results to your doctor's appointment. Take your medications as prescribed by your doctor. Please pick up your Magnesium tablets from your pharmacy.   Ask your doctor "what is my target blood pressure range". Monitor the amount of salt you are eating. Mailed education: "DASH diet" Continue to exercise as tolerated and remain active. Eat heart healthy diet (full of fruits, vegetables, whole grains, lean protein, water--limit salt, fat, and sugar/simple carb intake). Mailed education: high blood pressure: what you can do. Please review and call [phone], if you have any questions. I    . COMPLETED: HEMOGLOBIN A1C < 7       A1C 6.6 on2 /01/2020  04/22/2018 Hemoglobin A1C 6.2  11/22/2018 Hemoglobin A1C 6.0    . COMPLETED: Obtain annual  Lipid Profile, LDL-C  Done 05/24/2019    . Obtain Annual Eye (retinal)  Exam    On track    Client reports done 2020 and states he sees eye doctor about every 9 weeks for procedure for macular degeneration. Client reports legally blind.  A magnifier glass will be mailed to you as we discussed. Please call if you do not receive within the next 2-3 weeks.  And as we discussed, Please follow up with health care concierge regarding over the counter benefit card.    . Obtain Annual Foot  Exam   On track    It is important to follow up with your provider for recommended yearly exam.    . COMPLETED: Obtain annual screen for micro albuminuria (urine) , nephropathy (kidney problems)       Done 05/24/2019.    . Obtain Hemoglobin A1C at least 2 times per year   On track    It is important to follow up with your provider for recommended labs.    . Quit Smoking       Discussed smoking cessation and client reports he does not want smoking cessation at this time (08/28/2019).    . Visit Primary Care Provider or Endocrinologist at least 2 times per year    On track    It is important to follow up with your provider for recommended labs, procedures and medication refills.       Plan: RNCM will send updated care plan to client, send updated care plan to primary care. Send client blood pressure monitor and magnifier glass. Send Neurosurgeon. Chronic care management coordination will outreach per tier level within the next 6 months.    Thea Silversmith, RN, MSN, [place] [phone]

## 2019-08-29 ENCOUNTER — Ambulatory Visit: Payer: Self-pay

## 2019-08-30 ENCOUNTER — Ambulatory Visit: Payer: HMO | Admitting: Podiatry

## 2019-08-30 ENCOUNTER — Encounter: Payer: Self-pay | Admitting: Podiatry

## 2019-08-30 ENCOUNTER — Ambulatory Visit: Payer: HMO | Admitting: Orthotics

## 2019-08-30 ENCOUNTER — Other Ambulatory Visit: Payer: Self-pay

## 2019-08-30 VITALS — Temp 96.8°F

## 2019-08-30 DIAGNOSIS — B351 Tinea unguium: Secondary | ICD-10-CM

## 2019-08-30 DIAGNOSIS — M79676 Pain in unspecified toe(s): Secondary | ICD-10-CM

## 2019-08-30 DIAGNOSIS — E0843 Diabetes mellitus due to underlying condition with diabetic autonomic (poly)neuropathy: Secondary | ICD-10-CM | POA: Diagnosis not present

## 2019-08-30 NOTE — Progress Notes (Signed)
This patient returns to my office for at risk foot care.  This patient requires this care by a professional since this patient will be at risk due to having diabetes with neuropathy.  This patient is unable to cut nails himself since the patient cannot reach his nails.These nails are painful walking and wearing shoes.  This patient presents for at risk foot care today.  General Appearance  Alert, conversant and in no acute stress.  Vascular  Dorsalis pedis and posterior tibial  pulses are palpable  bilaterally.  Capillary return is within normal limits  bilaterally. Temperature is within normal limits  bilaterally.  Neurologic  Senn-Weinstein monofilament wire test diminished/absent   bilaterally. Muscle power within normal limits bilaterally.  Nails Thick disfigured discolored nails with subungual debris  from hallux to fifth toes bilaterally. No evidence of bacterial infection or drainage bilaterally.  Orthopedic  No limitations of motion  feet .  No crepitus or effusions noted.  No bony pathology or digital deformities noted. HAV  B/L.  Skin  normotropic skin with no porokeratosis noted bilaterally.  No signs of infections or ulcers noted.     Onychomycosis  Pain in right toes  Pain in left toes  Consent was obtained for treatment procedures.   Mechanical debridement of nails 1-5  bilaterally performed with a nail nipper.  Filed with dremel without incident.    Return office visit   10 weeks                   Told patient to return for periodic foot care and evaluation due to potential at risk complications.   Gardiner Barefoot DPM

## 2019-09-04 ENCOUNTER — Other Ambulatory Visit: Payer: Self-pay

## 2019-09-04 NOTE — Patient Outreach (Signed)
  [facility]) Care Management Chronic Special Needs Program  09/04/2019  Name: Scott Willis DOB: 21-May-1940  MRN: [id number]  Mr. Jocob Zaloudek is enrolled in a chronic special needs plan for Diabetes. RNCM called to follow up.   RNCM called services for the blind regarding magnifier.  Subjective: client reports he received blood pressure monitor, but it not sure how to use it. He reports he has a doctor appointment next week. RNCM encouraged client to take the blood pressure monitor with him to his provider office for instruction on how to use.  Goals Addressed            This Visit's Progress   . Client will use Assistive Devices as needed and verbalize understanding of device use (pt-stated)   On track    Client to take blood pressure monitor to provider office next week for demonstration of how to use.  Please call Eustaquio Maize [phone]), social worker with [facility]. She will help you with obtaining a magnifier and other items that may assist you.  Discussed Over-the-counter benefit card and encouraged client to use. Transfer to healthcare concierge done regarding client questions about the card; card activation and catalogue.    . Client will verbalize knowledge of self management of Hypertension as evidences by BP reading of 140/90 or less; or as defined by provider   On track     Discussed use of blood pressure monitor. Client to take blood pressure monitor to doctor appointment next week for demonstration of how to use.  Take your medications as prescribed by your doctor.   Ask your doctor "what is my target blood pressure range". Monitor the amount of salt you are eating.  Continue to exercise as tolerated and remain active. Eat heart healthy diet (full of fruits, vegetables, whole grains, lean protein, water--limit salt, fat, and sugar/simple carb intake).       Plan:  RNCM will outreach to client as previously  scheduled.    Thea Silversmith, RN, MSN, [place] [phone]

## 2019-09-08 ENCOUNTER — Ambulatory Visit: Payer: HMO | Admitting: Orthotics

## 2019-09-08 ENCOUNTER — Other Ambulatory Visit: Payer: Self-pay

## 2019-09-08 DIAGNOSIS — M2141 Flat foot [pes planus] (acquired), right foot: Secondary | ICD-10-CM | POA: Diagnosis not present

## 2019-09-08 DIAGNOSIS — E1140 Type 2 diabetes mellitus with diabetic neuropathy, unspecified: Secondary | ICD-10-CM | POA: Diagnosis not present

## 2019-09-08 DIAGNOSIS — M2142 Flat foot [pes planus] (acquired), left foot: Secondary | ICD-10-CM | POA: Diagnosis not present

## 2019-09-15 ENCOUNTER — Encounter: Payer: Self-pay | Admitting: Cardiology

## 2019-09-15 ENCOUNTER — Other Ambulatory Visit: Payer: Self-pay

## 2019-09-15 ENCOUNTER — Telehealth (INDEPENDENT_AMBULATORY_CARE_PROVIDER_SITE_OTHER): Payer: HMO | Admitting: Cardiology

## 2019-09-15 DIAGNOSIS — I10 Essential (primary) hypertension: Secondary | ICD-10-CM | POA: Diagnosis not present

## 2019-09-15 DIAGNOSIS — E785 Hyperlipidemia, unspecified: Secondary | ICD-10-CM

## 2019-09-15 DIAGNOSIS — F17200 Nicotine dependence, unspecified, uncomplicated: Secondary | ICD-10-CM

## 2019-09-15 DIAGNOSIS — I480 Paroxysmal atrial fibrillation: Secondary | ICD-10-CM

## 2019-09-15 NOTE — Progress Notes (Signed)
Virtual Visit via Telephone Note   This visit type was conducted due to national recommendations for restrictions regarding the COVID-19 Pandemic (e.g. social distancing) in an effort to limit this patient's exposure and mitigate transmission in our community.  Due to his co-morbid illnesses, this patient is at least at moderate risk for complications without adequate follow up.  This format is felt to be most appropriate for this patient at this time.  The patient did not have access to video technology/had technical difficulties with video requiring transitioning to audio format only (telephone).  All issues noted in this document were discussed and addressed.  No physical exam could be performed with this format.  Please refer to the patient's chart for his  consent to telehealth for [facility].   Date:  09/15/2019   ID:  Scott, Willis 1940-10-29, MRN [id number]  Patient Location: Home Provider Location: Home  PCP:  Eunice Blase, MD  Cardiologist:  Buford Dresser, MD  Electrophysiologist:  None   Evaluation Performed:  Follow-Up Visit  Chief Complaint:  Follow up  History of Present Illness:    KYSTON GONCE is a 79 y.o. male with PMH type II diabetes, chronic back pain, tobacco use, HTN, HLD, reported paroxysmal atrial fibrillation, macular degeneration.  The patient does not have symptoms concerning for COVID-19 infection (fever, chills, cough, or new shortness of breath).   Cardiac history: hypertension, tobacco use, reported prior possibly atrial fibrillation but declines monitor or anticoagulation. In general, if he feels well, he doesn't want to take medications. He states that he'd rather go from a heart attack or stroke than anything else. He wants to keep living his life and avoid medications.  Today: No major changes in the last year. Sometimes feels atrial fibrillation, once every three months or so. No clear triggers.  Following up with Dr.  Junius Roads re: diabetes, rising Cr, and blood pressure. He has declined tight control, feels that 150s is normal for him. Doesn't check at home. Takes OTC magnesium for blood pressure control. We discussed that he has had elevated blood pressure at his PCP checks. Was 176/68 at visit in 05/2019, 157/78 in 08/2019. I discussed with him that good blood pressure control will help protect his kidneys from getting worse. He was given a home blood pressure cuff but hasn't gotten batteries for it yet.  Still smoking, not interested in quitting. Does not want medications for his cholesterol. Working six days/week, 10-14 hours/day. No limitations. Doesn't need much sleep, about 4 hours/night, and does well with this.   Has been vaccinated, wearing a mask, requiring customers to wear masks.  Denies chest pain, shortness of breath at rest or with normal exertion. No PND, orthopnea, LE edema or unexpected weight gain. No syncope or palpitations.  Past Medical History:  Diagnosis Date  . Arthritis   . Benign essential hypertension   . Blepharitis, squamous    Right Eye   . Blindness   . BPH (benign prostatic hyperplasia)   . DM (diabetes mellitus) type II uncontrolled with eye manifestation (HCC)    fasting 110s  . Hyperlipidemia LDL goal < 100   . Kidney stones   . Low serum testosterone level   . Macular degeneration   . Osteoporosis due to androgen therapy   . Ulcer    gastric   Past Surgical History:  Procedure Laterality Date  . [place]  . BACK SURGERY  03/2006   Removed Rods  Dr.Nitka   . CARPAL TUNNEL RELEASE  08/2006  . CARPAL TUNNEL RELEASE  08/2006   Dr.Nitka   . CATARACT EXTRACTION  02/21/2007   Dr.Epps   . EYE SURGERY  2013   Right eye every 8 weeks   . KIDNEY STONE SURGERY  08/11/2007   Dr.Tannenbaum   . LITHOTRIPSY  08/11/2007   Dr.Nesi  . REFRACTIVE SURGERY  09/2005   Macular Degeneration Dr.Mathews   . SHOULDER ARTHROSCOPY WITH SUBACROMIAL DECOMPRESSION,  ROTATOR CUFF REPAIR AND BICEP TENDON REPAIR Left 08/22/2013   Procedure:  LEFT SHOULDER DIAGNOSTIC OPERATIVE ARTHROSCOPY ,EXTENSIVE DEBRIDEMENT, BICEPS RELEASE AND TENODESIS, ROTATOR CUFF REPAIR, SUBACROMIAL DECOMPRESSION;  Surgeon: Meredith Pel, MD;  Location: [place];  Service: Orthopedics;  Laterality: Left;  LEFT SHOULDER DOA,EXTENSIVE DEBRIDEMENT, BICEPS RELEASE AND TENODESIS, ROTATOR CUFF REPAIR, SUBACROMIAL DECOMPRESSION,     Current Meds  Medication Sig  . blood glucose meter kit and supplies KIT Check CBG QID  . Cholecalciferol (VITAMIN D-3) 125 MCG (5000 UT) TABS Take 1 tablet by mouth daily.  . diclofenac sodium (VOLTAREN) 1 % GEL Apply 4 g topically 4 (four) times daily as needed.  . finasteride (PROSCAR) 5 MG tablet Take 1 tablet (5 mg total) by mouth daily.  . Magnesium 400 MG CAPS Take 400 mg by mouth daily.  Marland Kitchen moxifloxacin (VIGAMOX) 0.5 % ophthalmic solution Place 1 drop into the right eye 3 (three) times daily. Every 16 weeks for 4 days after surgery  . oxyCODONE-acetaminophen (PERCOCET/ROXICET) 5-325 MG tablet Take 1-2 tablets by mouth every 6 (six) hours as needed for severe pain.  Vladimir Faster Glycol-Propyl Glycol (SYSTANE OP) Apply 1 drop to eye as needed (dry eyes).   . Prodigy Twist Top Lancets 28G MISC   . sitaGLIPtin (JANUVIA) 100 MG tablet Take 1 tablet (100 mg total) by mouth daily.  . tamsulosin (FLOMAX) 0.4 MG CAPS capsule Take 1 capsule (0.4 mg total) by mouth daily.  Marland Kitchen trimethoprim-polymyxin b (POLYTRIM) ophthalmic solution INSTILL 1 DROP INTO RIGHT EYE 4 TIMES DAILY FOR 2 DAYS AFTER EACH MONTHLY EYE INJECTION  . venlafaxine (EFFEXOR) 75 MG tablet Take 1 tablet (75 mg total) by mouth 2 (two) times daily.     Allergies:   Bee pollen, Tape, Fluorescein-benoxinate, Metformin, Metformin and related, Morphine and related, and Statins   Social History   Tobacco Use  . Smoking status: Current Every Day Smoker    Packs/day: 1.50    Years: 59.00    Pack years:  88.50    Types: Cigarettes  . Smokeless tobacco: Never Used  Substance Use Topics  . Alcohol use: No    Alcohol/week: 0.0 standard drinks  . Drug use: No     Family Hx: The patient's family history includes Alzheimer's disease in his mother; Colon cancer in his father; Diabetes in his sister.  ROS:   Please see the history of present illness. Additional ROS otherwise negative   Prior CV studies:   The following studies were reviewed today: Prior note/record from Dr. [name]  Labs/Other Tests and Data Reviewed:    EKG:  An ECG dated 04/24/15 was personally reviewed today and demonstrated:  NSR  Recent Labs: 05/24/2019: ALT 12; Hemoglobin 13.3; Platelets 175 08/21/2019: BUN 22; Creat 1.41; Potassium 4.9; Sodium 139   Recent Lipid Panel Lab Results  Component Value Date/Time   CHOL 165 05/24/2019 09:04 AM   CHOL 186 06/21/2014 09:19 AM   TRIG 95 05/24/2019 09:04 AM   HDL 53 05/24/2019 09:04 AM  HDL 53 06/21/2014 09:19 AM   CHOLHDL 3.1 05/24/2019 09:04 AM   LDLCALC 93 05/24/2019 09:04 AM    Wt Readings from Last 3 Encounters:  08/21/19 153 lb 3.2 oz (69.5 kg)  09/30/18 145 lb (65.8 kg)  04/01/18 154 lb 12.8 oz (70.2 kg)     Objective:    Vital Signs:  There were no vitals taken for this visit.   Speaking comfortably on the phone, no audible wheezing In no acute distress Alert and oriented Normal affect Normal speech  ASSESSMENT & PLAN:    Hypertension: no available home readings today. -see prior discussion. He is not interested in medication for his blood pressure -I am happy to revisit medication for hypertension with him at any time. Given his diabetes, I would consider ACEi/ARB first, or amlodipine as a second choice. I do not think he would appreciate the frequent urination of a thiazide.  Hyperlipidemia: last LDL 109, goal at least <100. He is not interested in medications, reports statin intolerance. Discussed alternative agents or other statins, he is  not interested.  Reported history of paroxysmal atrial fibrillation: he reports this personally, I do not have records/strips of this.  -CHA2DS2/VAS Stroke Risk Points=4 (age x2, HTN, DM) -has declined both monitor and anticoagulation  CV risk counseling: I discussed that tobacco use, uncontrolled HTN, HLD, and diabetes are all risk factors for cardiovascular disease. I discussed prevention of these. He declines to discuss medications. He is set with his diet and declines changes. He is active in his job.  Tobacco use: The patient was counseled on tobacco cessation today for 3 minutes.  Counseling included reviewing the risks of smoking tobacco products, how it impacts the patient's current medical diagnoses and different strategies for quitting.  Pharmacotherapy to aid in tobacco cessation was not prescribed today. He is not interested in quitting.  COVID-19 Education: The signs and symptoms of COVID-19 were discussed with the patient and how to seek care for testing (follow up with PCP or arrange E-visit).  The importance of social distancing was discussed today.  Time:   Today, I have spent 21 minutes with the patient with telehealth technology discussing the above problems.  Majority of that time spent on counseling and education.  Patient Instructions  Medication Instructions:  Your Physician recommend you continue on your current medication as directed.    *If you need a refill on your cardiac medications before your next appointment, please call your pharmacy*   Lab Work: None   Testing/Procedures: None   Follow-Up: At [facility], you and your health needs are our priority.  As part of our continuing mission to provide you with exceptional heart care, we have created designated Provider Care Teams.  These Care Teams include your primary Cardiologist (physician) and Advanced Practice Providers (APPs -  Physician Assistants and Nurse Practitioners) who all work together to  provide you with the care you need, when you need it.  We recommend signing up for the patient portal called "MyChart".  Sign up information is provided on this After Visit Summary.  MyChart is used to connect with patients for Virtual Visits (Telemedicine).  Patients are able to view lab/test results, encounter notes, upcoming appointments, etc.  Non-urgent messages can be sent to your provider as well.   To learn more about what you can do with MyChart, go to [email or web address].    Your next appointment:   As needed  The format for your next appointment:   Either In Person  or Virtual  Provider:   Dr. Harrell Gave      Follow Up:  PRN  Signed, Buford Dresser, MD  09/15/2019     [facility]

## 2019-09-15 NOTE — Patient Outreach (Signed)
  [facility]) Care Management Chronic Special Needs Program    09/15/2019  Name: TALIESIN HARTLAGE, DOB: 10-Feb-1941  MRN: [id number]   Mr. Caidin Heidenreich is enrolled in a chronic special needs plan for Diabetes.   Care Coordination: RNCM returned call to client. Client called to confirm the contact number for the [facility]. RNCM provided contact number for [facility], [place] [phone]. Also provided telephone number for OTC catolog for client to place order for diabetic socks.  Goals Addressed            This Visit's Progress   . Patient Stated: client will verbalize contact with local resources within the next 6 months. (pt-stated)   On track    Discussed contact number Education officer, museum for [employer] for the [place] [phone]. Over-the-counter catalog can be obtained through your [place]. You can place orders from the catalog by calling [phone].      Plan: send updated care plan to client. RNCM will continue to outreach per tier level.  Thea Silversmith, RN, MSN, [place] [phone]

## 2019-09-15 NOTE — Patient Instructions (Signed)
Medication Instructions:  Your Physician recommend you continue on your current medication as directed.    *If you need a refill on your cardiac medications before your next appointment, please call your pharmacy*   Lab Work: None   Testing/Procedures: None   Follow-Up: At [facility], you and your health needs are our priority.  As part of our continuing mission to provide you with exceptional heart care, we have created designated Provider Care Teams.  These Care Teams include your primary Cardiologist (physician) and Advanced Practice Providers (APPs -  Physician Assistants and Nurse Practitioners) who all work together to provide you with the care you need, when you need it.  We recommend signing up for the patient portal called "MyChart".  Sign up information is provided on this After Visit Summary.  MyChart is used to connect with patients for Virtual Visits (Telemedicine).  Patients are able to view lab/test results, encounter notes, upcoming appointments, etc.  Non-urgent messages can be sent to your provider as well.   To learn more about what you can do with MyChart, go to [email or web address].    Your next appointment:   As needed  The format for your next appointment:   Either In Person or Virtual  Provider:   Dr. Harrell Gave

## 2019-09-21 ENCOUNTER — Other Ambulatory Visit: Payer: Self-pay | Admitting: Family Medicine

## 2019-09-21 DIAGNOSIS — N401 Enlarged prostate with lower urinary tract symptoms: Secondary | ICD-10-CM

## 2019-09-21 DIAGNOSIS — R3914 Feeling of incomplete bladder emptying: Secondary | ICD-10-CM

## 2019-09-22 ENCOUNTER — Other Ambulatory Visit: Payer: Self-pay

## 2019-09-22 ENCOUNTER — Encounter (INDEPENDENT_AMBULATORY_CARE_PROVIDER_SITE_OTHER): Payer: HMO | Admitting: Ophthalmology

## 2019-09-22 DIAGNOSIS — H353231 Exudative age-related macular degeneration, bilateral, with active choroidal neovascularization: Secondary | ICD-10-CM | POA: Diagnosis not present

## 2019-09-22 DIAGNOSIS — H35033 Hypertensive retinopathy, bilateral: Secondary | ICD-10-CM | POA: Diagnosis not present

## 2019-09-22 DIAGNOSIS — H43813 Vitreous degeneration, bilateral: Secondary | ICD-10-CM

## 2019-09-22 DIAGNOSIS — I10 Essential (primary) hypertension: Secondary | ICD-10-CM | POA: Diagnosis not present

## 2019-09-27 ENCOUNTER — Ambulatory Visit: Payer: HMO | Admitting: Orthotics

## 2019-09-27 ENCOUNTER — Other Ambulatory Visit: Payer: Self-pay

## 2019-10-02 ENCOUNTER — Ambulatory Visit: Payer: HMO

## 2019-10-02 ENCOUNTER — Other Ambulatory Visit: Payer: Self-pay

## 2019-10-02 DIAGNOSIS — R748 Abnormal levels of other serum enzymes: Secondary | ICD-10-CM

## 2019-10-02 NOTE — Progress Notes (Signed)
BMET drawn to recheck kidney function per Dr. Junius Roads.

## 2019-10-03 ENCOUNTER — Telehealth: Payer: Self-pay | Admitting: Family Medicine

## 2019-10-03 DIAGNOSIS — R748 Abnormal levels of other serum enzymes: Secondary | ICD-10-CM

## 2019-10-03 DIAGNOSIS — E1139 Type 2 diabetes mellitus with other diabetic ophthalmic complication: Secondary | ICD-10-CM

## 2019-10-03 DIAGNOSIS — IMO0002 Reserved for concepts with insufficient information to code with codable children: Secondary | ICD-10-CM

## 2019-10-03 LAB — BASIC METABOLIC PANEL
BUN/Creatinine Ratio: 15 (calc) (ref 6–22)
BUN: 22 mg/dL (ref 7–25)
CO2: 26 mmol/L (ref 20–32)
Calcium: 9.4 mg/dL (ref 8.6–10.3)
Chloride: 103 mmol/L (ref 98–110)
Creat: 1.43 mg/dL — ABNORMAL HIGH (ref 0.70–1.18)
Glucose, Bld: 129 mg/dL — ABNORMAL HIGH (ref 65–99)
Potassium: 4.6 mmol/L (ref 3.5–5.3)
Sodium: 139 mmol/L (ref 135–146)

## 2019-10-03 NOTE — Telephone Encounter (Signed)
I called and advised the patient - he voiced understanding. The nephrologist's office will most likely call him directly about scheduling this appointment.

## 2019-10-03 NOTE — Telephone Encounter (Signed)
Labs show that kidney function/creatinine has gotten slightly worse at 1.43.  I would like to refer him to a nephrologist to be sure we are doing everything possible to keep his kidneys healthy.

## 2019-11-15 ENCOUNTER — Encounter: Payer: Self-pay | Admitting: Podiatry

## 2019-11-15 ENCOUNTER — Other Ambulatory Visit: Payer: Self-pay

## 2019-11-15 ENCOUNTER — Ambulatory Visit: Payer: HMO | Admitting: Podiatry

## 2019-11-15 DIAGNOSIS — M2142 Flat foot [pes planus] (acquired), left foot: Secondary | ICD-10-CM

## 2019-11-15 DIAGNOSIS — E0843 Diabetes mellitus due to underlying condition with diabetic autonomic (poly)neuropathy: Secondary | ICD-10-CM

## 2019-11-15 DIAGNOSIS — M79676 Pain in unspecified toe(s): Secondary | ICD-10-CM

## 2019-11-15 DIAGNOSIS — B351 Tinea unguium: Secondary | ICD-10-CM

## 2019-11-15 DIAGNOSIS — M2141 Flat foot [pes planus] (acquired), right foot: Secondary | ICD-10-CM

## 2019-11-15 NOTE — Progress Notes (Signed)
This patient returns to my office for at risk foot care.  This patient requires this care by a professional since this patient will be at risk due to having diabetes with neuropathy.  This patient is unable to cut nails himself since the patient cannot reach his nails.These nails are painful walking and wearing shoes.  This patient presents for at risk foot care today.  General Appearance  Alert, conversant and in no acute stress.  Vascular  Dorsalis pedis and posterior tibial  pulses are palpable  bilaterally.  Capillary return is within normal limits  bilaterally. Temperature is within normal limits  bilaterally.  Neurologic  Senn-Weinstein monofilament wire test diminished/absent   bilaterally. Muscle power within normal limits bilaterally.  Nails Thick disfigured discolored nails with subungual debris  from hallux to fifth toes bilaterally. No evidence of bacterial infection or drainage bilaterally.  Orthopedic  No limitations of motion  feet .  No crepitus or effusions noted.  No bony pathology or digital deformities noted. HAV  B/L.  Skin  normotropic skin no porokeratosis noted sub 5th met left foot.  No signs of infections or ulcers noted.     Onychomycosis  Pain in right toes  Pain in left toes  Consent was obtained for treatment procedures.   Mechanical debridement of nails 1-5  bilaterally performed with a nail nipper.  Filed with dremel without incident. Debride porokeratosis with # 15 blade.   Return office visit   10 weeks                   Told patient to return for periodic foot care and evaluation due to potential at risk complications.    Gardiner Barefoot DPM

## 2019-11-22 DIAGNOSIS — N1832 Chronic kidney disease, stage 3b: Secondary | ICD-10-CM | POA: Diagnosis not present

## 2019-11-22 DIAGNOSIS — N2581 Secondary hyperparathyroidism of renal origin: Secondary | ICD-10-CM | POA: Diagnosis not present

## 2019-11-22 DIAGNOSIS — I129 Hypertensive chronic kidney disease with stage 1 through stage 4 chronic kidney disease, or unspecified chronic kidney disease: Secondary | ICD-10-CM | POA: Diagnosis not present

## 2019-11-22 DIAGNOSIS — D631 Anemia in chronic kidney disease: Secondary | ICD-10-CM | POA: Diagnosis not present

## 2019-11-27 ENCOUNTER — Other Ambulatory Visit: Payer: Self-pay | Admitting: Nephrology

## 2019-11-27 DIAGNOSIS — N1832 Chronic kidney disease, stage 3b: Secondary | ICD-10-CM

## 2019-12-05 ENCOUNTER — Ambulatory Visit
Admission: RE | Admit: 2019-12-05 | Discharge: 2019-12-05 | Disposition: A | Discharge status: Home / Self Care | Payer: HMO | Source: Ambulatory Visit | Attending: Nephrology | Admitting: Nephrology

## 2019-12-05 DIAGNOSIS — N400 Enlarged prostate without lower urinary tract symptoms: Secondary | ICD-10-CM | POA: Diagnosis not present

## 2019-12-05 DIAGNOSIS — N1832 Chronic kidney disease, stage 3b: Secondary | ICD-10-CM

## 2019-12-08 ENCOUNTER — Encounter (INDEPENDENT_AMBULATORY_CARE_PROVIDER_SITE_OTHER): Payer: HMO | Admitting: Ophthalmology

## 2019-12-08 ENCOUNTER — Other Ambulatory Visit: Payer: Self-pay

## 2019-12-08 DIAGNOSIS — H43813 Vitreous degeneration, bilateral: Secondary | ICD-10-CM

## 2019-12-08 DIAGNOSIS — I10 Essential (primary) hypertension: Secondary | ICD-10-CM

## 2019-12-08 DIAGNOSIS — H35033 Hypertensive retinopathy, bilateral: Secondary | ICD-10-CM

## 2019-12-08 DIAGNOSIS — H353213 Exudative age-related macular degeneration, right eye, with inactive scar: Secondary | ICD-10-CM | POA: Diagnosis not present

## 2020-02-02 ENCOUNTER — Ambulatory Visit: Payer: HMO | Admitting: Podiatry

## 2020-02-02 ENCOUNTER — Other Ambulatory Visit: Payer: Self-pay

## 2020-02-02 ENCOUNTER — Encounter: Payer: Self-pay | Admitting: Podiatry

## 2020-02-02 DIAGNOSIS — M79676 Pain in unspecified toe(s): Secondary | ICD-10-CM

## 2020-02-02 DIAGNOSIS — M2142 Flat foot [pes planus] (acquired), left foot: Secondary | ICD-10-CM | POA: Diagnosis not present

## 2020-02-02 DIAGNOSIS — B351 Tinea unguium: Secondary | ICD-10-CM | POA: Diagnosis not present

## 2020-02-02 DIAGNOSIS — E0843 Diabetes mellitus due to underlying condition with diabetic autonomic (poly)neuropathy: Secondary | ICD-10-CM

## 2020-02-02 DIAGNOSIS — M2141 Flat foot [pes planus] (acquired), right foot: Secondary | ICD-10-CM

## 2020-02-02 NOTE — Progress Notes (Signed)
This patient returns to my office for at risk foot care.  This patient requires this care by a professional since this patient will be at risk due to having diabetes with neuropathy.  This patient is unable to cut nails himself since the patient cannot reach his nails.These nails are painful walking and wearing shoes.  This patient presents for at risk foot care today.  General Appearance  Alert, conversant and in no acute stress.  Vascular  Dorsalis pedis and posterior tibial  pulses are palpable  bilaterally.  Capillary return is within normal limits  bilaterally. Temperature is within normal limits  bilaterally.  Neurologic  Senn-Weinstein monofilament wire test diminished/absent   bilaterally. Muscle power within normal limits bilaterally.  Nails Thick disfigured discolored nails with subungual debris  from hallux to fifth toes bilaterally. No evidence of bacterial infection or drainage bilaterally.  Orthopedic  No limitations of motion  feet .  No crepitus or effusions noted.  No bony pathology or digital deformities noted. HAV  B/L.  Skin  normotropic skin no porokeratosis noted sub 5th met left foot.  No signs of infections or ulcers noted.     Onychomycosis  Pain in right toes  Pain in left toes  Consent was obtained for treatment procedures.   Mechanical debridement of nails 1-5  bilaterally performed with a nail nipper.  Filed with dremel without incident. Debride porokeratosis with # 15 blade.   Return office visit   10 weeks                   Told patient to return for periodic foot care and evaluation due to potential at risk complications.    Gardiner Barefoot DPM

## 2020-02-03 ENCOUNTER — Other Ambulatory Visit: Payer: Self-pay | Admitting: Family Medicine

## 2020-02-13 ENCOUNTER — Other Ambulatory Visit: Payer: Self-pay

## 2020-02-13 ENCOUNTER — Telehealth: Payer: Self-pay | Admitting: Family Medicine

## 2020-02-13 NOTE — Telephone Encounter (Signed)
The patient says he never heard anything from Dr. Candiss Norse on his ultrasound (renal) from August. He asks if we could advise him of the findings. Also, he would like to know when he should be seen her again &/or have more bloodwork. He is mainly concerned about his A1C, which was last checked in February.

## 2020-02-13 NOTE — Telephone Encounter (Signed)
Pt called stating he would like a CB in regards to a DR. That he had been referred too and got a MRI but then the Dr. Janice Coffin reached back out to him. Pt would like to know if he can get these results from [place]   [phone]

## 2020-02-13 NOTE — Patient Outreach (Signed)
[facility]) Care Management Chronic Special Needs Program  02/13/2020  Name: Scott Willis DOB: [birth year]  MRN: [id number]  [name] is enrolled in a chronic special needs plan for Diabetes. RNCM called to follow up, review and update care plan.  Subjective: client reports he continues to be active and work 6 days a week. Client states he may have missed an appointment and states he will call provider office to make sure his next visit is scheduled. Client reports he checks his blood sugar 2-3 times a week. Last A1C 6.20 May 2019. Client states last blood sugar 132 a couple days ago. He states he never goes below 70. Client states he is a "creature of habit" and continues to eat the same thing every day". Client denies any issues or concerns at this time. Client reports he has not contacted services for the Blind social worker and prefers to call himself at his leisure. Client states he has received both Covid vaccines and plans to take the booster.   Goals Addressed              This Visit's Progress   .  COMPLETED: Client will report no worsening of symptoms of Atrial Fibrillation within the next 6-9 months        Denies any signs or symptoms. Continue to follow up with your provider as scheduled. Continue to take medications as prescribed. Signs/symptoms of worsening: racing or irregular heart beat that may be uncomfortable shortness of breath with our without chest pain weakness; dizziness.     .  Client will use Assistive Devices as needed and verbalize understanding of device use (pt-stated)   On track     Client to take blood pressure monitor to provider office for demonstration of how to use. Please take any equipment for example your blood pressure cuff or your glucose meter to your provider office or pharmacy if you need any assistance with how to use. Client to contact social worker with [facility] for the  [place].  Reiterated Social workers with [employer] [facility] for other eligible devices/resources:  Scott Willis [place] [phone].    Marland Kitchen  Client will verbalize knowledge of self management of Hypertension as evidences by BP reading of 140/90 or less; or as defined by provider   On track     Discussed use of blood pressure monitor. Client to take blood pressure monitor to doctor appointment next week for demonstration of how to use.  Continue Hypertension self management actions: Take your medications as prescribed by your doctor.   Know your target blood pressure range. Monitor the amount of salt you are eating.  Continue to exercise as tolerated and remain active. Eat heart healthy diet (full of fruits, vegetables, whole grains, lean protein, water--limit salt, fat, and sugar/simple carb intake).     .  COMPLETED: Obtain Annual Eye (retinal)  Exam         Client reports done 2021.    Marland Kitchen  COMPLETED: Obtain Annual Foot Exam        Client reports foot exam completed.    .  Obtain Hemoglobin A1C at least 2 times per year   On track     Last A1C 6.6 on 05/24/19 Discussed importance of attending provider visits.  It is important to follow up with your provider for recommended labs, procedures and prescription refills.    .  Patient Stated: client will verbalize contact with local  resources within the next 6 months. (pt-stated)   On track     Reinforced contact number for Social workers for [employer] for [facility]:  [employer] [phone].        Marland Kitchen  COMPLETED: Quit Smoking        Discussed smoking cessation and client reports he does not want to quest smoking.(02/13/2020).    .  Visit Primary Care Provider or Endocrinologist at least 2 times per year    On track     Discussed follow up appointments. Client to call primary care to find out when next appointment is due.  It is important to follow  up with your provider for recommended labs, procedures and medication refills.      Client with benefit question regarding glasses. Warm transfer to [place] completed.  Plan: Send updated care plan to client; send updated care plan to primary care provider. [facility] Care Management will continue to provide services for this member through 04/12/2020. Member will be followed by the [facility] Case management team beginning April 13, 2020.  Thea Silversmith, RN, MSN, [place] [phone]

## 2020-02-14 NOTE — Telephone Encounter (Signed)
I called and advised the patient of the [place] results. Appointment scheduled for 6 months' follow up of diabetes/hypertension 02/23/20 at 8:40 - can check his Hgb A1C that day.

## 2020-02-14 NOTE — Telephone Encounter (Signed)
I called - no answer. Will try again later.

## 2020-02-14 NOTE — Telephone Encounter (Signed)
Ultrasound was normal.  Yes, probably due for diabetes check in the next couple weeks.

## 2020-02-16 ENCOUNTER — Encounter (INDEPENDENT_AMBULATORY_CARE_PROVIDER_SITE_OTHER): Payer: HMO | Admitting: Ophthalmology

## 2020-02-16 ENCOUNTER — Other Ambulatory Visit: Payer: Self-pay

## 2020-02-16 DIAGNOSIS — H43813 Vitreous degeneration, bilateral: Secondary | ICD-10-CM

## 2020-02-16 DIAGNOSIS — H35033 Hypertensive retinopathy, bilateral: Secondary | ICD-10-CM

## 2020-02-16 DIAGNOSIS — H353231 Exudative age-related macular degeneration, bilateral, with active choroidal neovascularization: Secondary | ICD-10-CM

## 2020-02-16 DIAGNOSIS — I10 Essential (primary) hypertension: Secondary | ICD-10-CM

## 2020-02-23 ENCOUNTER — Ambulatory Visit (INDEPENDENT_AMBULATORY_CARE_PROVIDER_SITE_OTHER): Payer: HMO | Admitting: Family Medicine

## 2020-02-23 ENCOUNTER — Other Ambulatory Visit: Payer: Self-pay

## 2020-02-23 VITALS — BP 184/87 | HR 65 | Ht 70.5 in | Wt 147.0 lb

## 2020-02-23 DIAGNOSIS — R748 Abnormal levels of other serum enzymes: Secondary | ICD-10-CM

## 2020-02-23 DIAGNOSIS — E1139 Type 2 diabetes mellitus with other diabetic ophthalmic complication: Secondary | ICD-10-CM | POA: Diagnosis not present

## 2020-02-23 DIAGNOSIS — E785 Hyperlipidemia, unspecified: Secondary | ICD-10-CM

## 2020-02-23 DIAGNOSIS — E1142 Type 2 diabetes mellitus with diabetic polyneuropathy: Secondary | ICD-10-CM | POA: Diagnosis not present

## 2020-02-23 DIAGNOSIS — R41840 Attention and concentration deficit: Secondary | ICD-10-CM | POA: Diagnosis not present

## 2020-02-23 DIAGNOSIS — N400 Enlarged prostate without lower urinary tract symptoms: Secondary | ICD-10-CM | POA: Diagnosis not present

## 2020-02-23 DIAGNOSIS — I10 Essential (primary) hypertension: Secondary | ICD-10-CM | POA: Diagnosis not present

## 2020-02-23 DIAGNOSIS — E1165 Type 2 diabetes mellitus with hyperglycemia: Secondary | ICD-10-CM | POA: Diagnosis not present

## 2020-02-23 DIAGNOSIS — IMO0002 Reserved for concepts with insufficient information to code with codable children: Secondary | ICD-10-CM

## 2020-02-23 DIAGNOSIS — R209 Unspecified disturbances of skin sensation: Secondary | ICD-10-CM

## 2020-02-23 DIAGNOSIS — L989 Disorder of the skin and subcutaneous tissue, unspecified: Secondary | ICD-10-CM | POA: Diagnosis not present

## 2020-02-23 NOTE — Progress Notes (Signed)
Office Visit Note   Patient: Scott Willis           Date of Birth: 1941/02/07           MRN: [id number] Visit Date: 02/23/2020 Requested by: Eunice Blase, MD [address] [place],  [place] [address] PCP: Eunice Blase, MD  Subjective: Chief Complaint  Patient presents with  . Blood Pressure Check  . Diabetes Mellitus    HPI: He is here for monitoring of medical conditions.  He was only scheduled for 20-minute visit, he has a lot of issues to discuss.  , Diabetes standpoint he does not check blood sugars regularly.  He saw a podiatrist recently.  He has a history of retinopathy and nephropathy.  He sees his eye doctor on a regular basis.  He does not check his blood pressure at home.  He does not have any chest pain, headaches, or any other symptoms related to blood pressure.  He continues to have BPH troubles.  Now he is having some leakage of stool.  He is on Flomax and Proscar, still has nocturia 3 episodes per night.  He has skin lesions on his left forehead and below his right eye that he would like to have addressed.  His hands seem cold all the time.                ROS:   All other systems were reviewed and are negative.  Objective: Vital Signs: BP (!) 184/87 (Cuff Size: Normal) Comment (Cuff Size): Taken with the patient's home BP monitor  Pulse 65   Ht 5' 10.5" (1.791 m)   Wt 147 lb (66.7 kg)   BMI 20.79 kg/m   Physical Exam:  General:  Alert and oriented, in no acute distress. Pulm:  Breathing unlabored. Psy:  Normal mood, congruent affect. Skin: He has what appear to be seborrheic keratoses left forehead and below the right eye. Neck: No thyromegaly or nodules. CV: Regular rate and rhythm without murmurs, rubs, or gallops.  No peripheral edema.  2+ radial and posterior tibial pulses. Lungs: Clear to auscultation throughout with no wheezing or areas of consolidation.    Imaging: No results found.  Assessment & Plan: 1.  Diabetes -Labs to  monitor. -Return in 3 months for a 40-minute visit so we have more time to address his questions.  2.  Hypertension, suboptimally controlled. -He has a machine now and will start monitoring at home.  3.  BPH -Referral to urology.  4.  Skin lesions -Dermatology referral.  5.  Cold hands -We will check thyroid function and screen for anemia.     Procedures: No procedures performed  No notes on file     PMFS History: Patient Active Problem List   Diagnosis Date Noted  . Vitamin D deficiency 05/24/2019  . Diabetic neuropathy ([place]) 12/26/2018  . Paroxysmal atrial fibrillation ([place]) 09/30/2018  . Statin intolerance 09/30/2018  . Closed fracture of left side of maxilla with routine healing 04/21/2017  . Depression 09/11/2013  . Hereditary and idiopathic peripheral neuropathy 08/29/2013  . Dysphagia, pharyngoesophageal phase 08/29/2013  . Rotator cuff tear 08/22/2013  . Left shoulder pain 07/31/2013  . Tobacco use disorder 04/19/2013  . Pallor 04/11/2013  . Gastric ulcer 04/11/2013  . Melena 04/11/2013  . Macular degeneration   . Benign essential hypertension   . Hyperlipidemia with target low density lipoprotein (LDL) cholesterol less than 100 mg/dL   . Osteoporosis due to androgen therapy   . Low serum  testosterone level   . BPH (benign prostatic hyperplasia)   . DM (diabetes mellitus) type II uncontrolled with eye manifestation ([place])    Past Medical History:  Diagnosis Date  . Arthritis   . Benign essential hypertension   . Blepharitis, squamous    Right Eye   . Blindness   . BPH (benign prostatic hyperplasia)   . DM (diabetes mellitus) type II uncontrolled with eye manifestation ([facility])    fasting 110s  . Hyperlipidemia LDL goal < 100   . Kidney stones   . Low serum testosterone level   . Macular degeneration   . Osteoporosis due to androgen therapy   . Ulcer    gastric    Family History  Problem Relation Age of Onset  . Alzheimer's disease Mother   .  Colon cancer Father   . Diabetes Sister     Past Surgical History:  Procedure Laterality Date  . [place]  . BACK SURGERY  03/2006   Removed Rods Dr.Nitka   . CARPAL TUNNEL RELEASE  08/2006  . CARPAL TUNNEL RELEASE  08/2006   Dr.Nitka   . CATARACT EXTRACTION  02/21/2007   Dr.Epps   . EYE SURGERY  2013   Right eye every 8 weeks   . KIDNEY STONE SURGERY  08/11/2007   Dr.Tannenbaum   . LITHOTRIPSY  08/11/2007   Dr.Nesi  . REFRACTIVE SURGERY  09/2005   Macular Degeneration Dr.Mathews   . 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 Smoking status: Current Every Day Smoker    Packs/day: 1.50    Years: 59.00    Pack years: 88.50    Types: Cigarettes  . Smokeless tobacco: Never Used  Substance and Sexual Activity  . Alcohol use: No    Alcohol/week: 0.0 standard drinks  . Drug use: No  . Sexual activity: Not on file

## 2020-02-23 NOTE — Addendum Note (Signed)
Addended by: Hortencia Pilar on: 02/23/2020 09:21 AM   Modules accepted: Orders

## 2020-02-24 LAB — CBC WITH DIFFERENTIAL/PLATELET
Absolute Monocytes: 708 cells/uL (ref 200–950)
Basophils Absolute: 69 cells/uL (ref 0–200)
Basophils Relative: 0.9 %
Eosinophils Absolute: 262 cells/uL (ref 15–500)
Eosinophils Relative: 3.4 %
HCT: 39.3 % (ref 38.5–50.0)
Hemoglobin: 13.4 g/dL (ref 13.2–17.1)
Lymphs Abs: 2387 cells/uL (ref 850–3900)
MCH: 31.6 pg (ref 27.0–33.0)
MCHC: 34.1 g/dL (ref 32.0–36.0)
MCV: 92.7 fL (ref 80.0–100.0)
MPV: 12.1 fL (ref 7.5–12.5)
Monocytes Relative: 9.2 %
Neutro Abs: 4274 cells/uL (ref 1500–7800)
Neutrophils Relative %: 55.5 %
Platelets: 172 10*3/uL (ref 140–400)
RBC: 4.24 10*6/uL (ref 4.20–5.80)
RDW: 12.9 % (ref 11.0–15.0)
Total Lymphocyte: 31 %
WBC: 7.7 10*3/uL (ref 3.8–10.8)

## 2020-02-24 LAB — MICROALBUMIN, URINE: Microalb, Ur: 0.3 mg/dL

## 2020-02-24 LAB — HEMOGLOBIN A1C
Hgb A1c MFr Bld: 6.1 % of total Hgb — ABNORMAL HIGH (ref <5.7)
Mean Plasma Glucose: 128 (calc)
eAG (mmol/L): 7.1 (calc)

## 2020-02-24 LAB — THYROID PANEL WITH TSH
Free Thyroxine Index: 2.3 (ref 1.4–3.8)
T3 Uptake: 31 % (ref 22–35)
T4, Total: 7.5 ug/dL (ref 4.9–10.5)
TSH: 4.02 mIU/L (ref 0.40–4.50)

## 2020-02-24 LAB — BASIC METABOLIC PANEL
BUN/Creatinine Ratio: 16 (calc) (ref 6–22)
BUN: 21 mg/dL (ref 7–25)
CO2: 26 mmol/L (ref 20–32)
Calcium: 9.3 mg/dL (ref 8.6–10.3)
Chloride: 104 mmol/L (ref 98–110)
Creat: 1.28 mg/dL — ABNORMAL HIGH (ref 0.70–1.18)
Glucose, Bld: 99 mg/dL (ref 65–99)
Potassium: 4.3 mmol/L (ref 3.5–5.3)
Sodium: 141 mmol/L (ref 135–146)

## 2020-02-24 LAB — IRON,TIBC AND FERRITIN PANEL
%SAT: 34 % (calc) (ref 20–48)
Ferritin: 93 ng/mL (ref 24–380)
Iron: 116 ug/dL (ref 50–180)
TIBC: 342 mcg/dL (calc) (ref 250–425)

## 2020-02-25 ENCOUNTER — Telehealth: Payer: Self-pay | Admitting: Family Medicine

## 2020-02-25 NOTE — Telephone Encounter (Signed)
Labs show:  Diabetes looks good.  A1C improved to 6.1.  Kidney function (creatinine) is better at 1.28.  Iron studies are normal.  Thyroid studies are in normal range, but borderline.  If he's still having cold extremities in a few months, we might recheck the thyroid.

## 2020-02-26 NOTE — Telephone Encounter (Signed)
I called and advised the patient of his results. He asked about getting a flu shot. I referred him to his local pharmacy for this, as we do not give them here.

## 2020-03-01 ENCOUNTER — Other Ambulatory Visit: Payer: Self-pay

## 2020-03-01 NOTE — Patient Outreach (Signed)
  [facility]) Care Management Chronic Special Needs Program    03/01/2020  Name: Scott Willis, DOB: 03/08/1941  MRN: [id number]   [name] is enrolled in a chronic special needs plan for Diabetes. [place] Management will continue to provide services for this member through 04/12/2020. The [facility] Care Management Team will assume care 04/13/2020.   Thea Silversmith, RN, MSN, [place] [phone]

## 2020-04-19 ENCOUNTER — Other Ambulatory Visit: Payer: Self-pay

## 2020-04-19 ENCOUNTER — Ambulatory Visit: Payer: HMO | Admitting: Podiatry

## 2020-04-19 ENCOUNTER — Encounter: Payer: Self-pay | Admitting: Podiatry

## 2020-04-19 DIAGNOSIS — B351 Tinea unguium: Secondary | ICD-10-CM | POA: Diagnosis not present

## 2020-04-19 DIAGNOSIS — Q828 Other specified congenital malformations of skin: Secondary | ICD-10-CM

## 2020-04-19 DIAGNOSIS — M79676 Pain in unspecified toe(s): Secondary | ICD-10-CM | POA: Diagnosis not present

## 2020-04-19 DIAGNOSIS — E1142 Type 2 diabetes mellitus with diabetic polyneuropathy: Secondary | ICD-10-CM | POA: Diagnosis not present

## 2020-04-19 NOTE — Progress Notes (Signed)
This patient returns to my office for at risk foot care.  This patient requires this care by a professional since this patient will be at risk due to having diabetes with neuropathy.  This patient is unable to cut nails himself since the patient cannot reach his nails.These nails are painful walking and wearing shoes.  This patient presents for at risk foot care today.  General Appearance  Alert, conversant and in no acute stress.  Vascular  Dorsalis pedis and posterior tibial  pulses are palpable  bilaterally.  Capillary return is within normal limits  bilaterally. Temperature is within normal limits  bilaterally.  Neurologic  Senn-Weinstein monofilament wire test diminished/absent   bilaterally. Muscle power within normal limits bilaterally.  Nails Thick disfigured discolored nails with subungual debris  from hallux to fifth toes bilaterally. No evidence of bacterial infection or drainage bilaterally.  Orthopedic  No limitations of motion  feet .  No crepitus or effusions noted.  No bony pathology or digital deformities noted. HAV  B/L.  Skin  normotropic skin no porokeratosis noted sub 5th met left foot.  No signs of infections or ulcers noted.     Onychomycosis  Pain in right toes  Pain in left toes  Porokeratosis  Left foot  Consent was obtained for treatment procedures.   Mechanical debridement of nails 1-5  bilaterally performed with a nail nipper.  Filed with dremel without incident. Debride porokeratosis with # 15 blade.   Return office visit   10 weeks                   Told patient to return for periodic foot care and evaluation due to potential at risk complications.    Gardiner Barefoot DPM

## 2020-04-23 ENCOUNTER — Other Ambulatory Visit: Payer: Self-pay

## 2020-05-03 ENCOUNTER — Encounter (INDEPENDENT_AMBULATORY_CARE_PROVIDER_SITE_OTHER): Payer: HMO | Admitting: Ophthalmology

## 2020-05-08 ENCOUNTER — Encounter (INDEPENDENT_AMBULATORY_CARE_PROVIDER_SITE_OTHER): Payer: HMO | Admitting: Ophthalmology

## 2020-05-08 ENCOUNTER — Other Ambulatory Visit: Payer: Self-pay

## 2020-05-08 DIAGNOSIS — I10 Essential (primary) hypertension: Secondary | ICD-10-CM | POA: Diagnosis not present

## 2020-05-08 DIAGNOSIS — H35033 Hypertensive retinopathy, bilateral: Secondary | ICD-10-CM

## 2020-05-08 DIAGNOSIS — H43813 Vitreous degeneration, bilateral: Secondary | ICD-10-CM

## 2020-05-08 DIAGNOSIS — H353231 Exudative age-related macular degeneration, bilateral, with active choroidal neovascularization: Secondary | ICD-10-CM

## 2020-05-16 ENCOUNTER — Telehealth: Payer: Self-pay | Admitting: Family Medicine

## 2020-05-16 NOTE — Telephone Encounter (Signed)
The patient has been scheduled to see Dr. Candiss Norse (nephrologist) again on 05/20/20. However, the patient does not want to see that doctor again and would prefer to see a urologist, instead. The patient was referred to urology in November, but he has yet to be given an appointment. Our referral coordinator is looking into this. The patient would like to know if Dr. Junius Roads is ok with his cancelling that Monday appointment with Dr. Candiss Norse.

## 2020-05-16 NOTE — Telephone Encounter (Signed)
Pt called stating Dr.Hilts referred him to another Dr. and he's not sure he wants to go through with the appt, so he would like a CB so this can be discussed further.    [phone]

## 2020-05-17 NOTE — Telephone Encounter (Signed)
I think that would be ok, as long as his creatinine level doesn't get worse next time we check it.

## 2020-05-17 NOTE — Telephone Encounter (Signed)
I called and advised the patient of this. He said he will call Dr. Keturah Barre office and cancel Monday's appt.

## 2020-05-23 ENCOUNTER — Encounter: Payer: Self-pay | Admitting: Family Medicine

## 2020-05-23 ENCOUNTER — Other Ambulatory Visit: Payer: Self-pay

## 2020-05-23 ENCOUNTER — Ambulatory Visit (INDEPENDENT_AMBULATORY_CARE_PROVIDER_SITE_OTHER): Payer: HMO | Admitting: Family Medicine

## 2020-05-23 VITALS — BP 162/58 | HR 60 | Ht 70.5 in | Wt 145.4 lb

## 2020-05-23 DIAGNOSIS — R209 Unspecified disturbances of skin sensation: Secondary | ICD-10-CM | POA: Diagnosis not present

## 2020-05-23 DIAGNOSIS — I10 Essential (primary) hypertension: Secondary | ICD-10-CM | POA: Diagnosis not present

## 2020-05-23 DIAGNOSIS — R748 Abnormal levels of other serum enzymes: Secondary | ICD-10-CM | POA: Diagnosis not present

## 2020-05-23 DIAGNOSIS — E1139 Type 2 diabetes mellitus with other diabetic ophthalmic complication: Secondary | ICD-10-CM | POA: Diagnosis not present

## 2020-05-23 DIAGNOSIS — IMO0002 Reserved for concepts with insufficient information to code with codable children: Secondary | ICD-10-CM

## 2020-05-23 DIAGNOSIS — E1142 Type 2 diabetes mellitus with diabetic polyneuropathy: Secondary | ICD-10-CM | POA: Diagnosis not present

## 2020-05-23 DIAGNOSIS — E1165 Type 2 diabetes mellitus with hyperglycemia: Secondary | ICD-10-CM | POA: Diagnosis not present

## 2020-05-23 DIAGNOSIS — E559 Vitamin D deficiency, unspecified: Secondary | ICD-10-CM

## 2020-05-23 DIAGNOSIS — F17200 Nicotine dependence, unspecified, uncomplicated: Secondary | ICD-10-CM

## 2020-05-23 DIAGNOSIS — N400 Enlarged prostate without lower urinary tract symptoms: Secondary | ICD-10-CM | POA: Diagnosis not present

## 2020-05-23 NOTE — Progress Notes (Signed)
Office Visit Note   Patient: Scott Willis           Date of Birth: 04/19/1940           MRN: [id number] Visit Date: 05/23/2020 Requested by: Eunice Blase, MD [address] [place],  [place] [address] PCP: Eunice Blase, MD  Subjective: Chief Complaint  Patient presents with  . Blood Pressure Check  . Diabetes Mellitus    HPI: He is here for routine monitoring of medical conditions.  From a diabetes standpoint his last A1c was 6.1.  He saw the podiatrist 2 weeks ago.  He does not have any lesions on his feet.  He does have neuropathy but symptoms are very manageable and he was even able to stop Effexor without any change in symptoms.  Blood pressure was actually normal when he had his eye surgery recently.  He does not check his blood sugar on his own.  He is asymptomatic, no chest pain, headaches, peripheral edema.  Prostate symptoms remain unchanged.  He plans to see a urologist in the near future.  He is on Flomax.  He continues to have cold hands.  Labs last time did not show anemia or thyroid dysfunction.  His symptoms are better when he is up moving around.  When he sits, he has to keep his hands in front of a heater.  He gets occasional pain in the right clavicle area when he sleeps on his side at night.  He goes away quickly when he turns over.  It does not bother him during the day.  It has been present for a few years.  He has a history of clavicle fracture when he was younger.  He felt that he should mention this.                ROS:   All other systems were reviewed and are negative.  Objective: Vital Signs: BP (!) 162/58   Pulse 60   Ht 5' 10.5" (1.791 m)   Wt 145 lb 6.4 oz (66 kg)   BMI 20.57 kg/m   Physical Exam:  General:  Alert and oriented, in no acute distress. Pulm:  Breathing unlabored. Psy:  Normal mood, congruent affect.  Neck: No carotid bruits, no thyromegaly. CV: Regular rate and rhythm without murmurs, rubs, or gallops.  No peripheral  edema.  2+ radial and posterior tibial pulses. Lungs: Clear to auscultation throughout with no wheezing or areas of consolidation. Right clavicle: No palpable deformities.  Slightly tender to palpation mid to proximal clavicle.  Good range of motion of his shoulder. Hands: He has 1+ radial and ulnar pulses with slow feeling on Allen's test.  There is nonetheless filling from both arteries in both wrists.    Imaging: No results found.  Assessment & Plan: 1.  Diabetes, has been adequately controlled. -Recheck labs in 3 months.  2.  Hypertension, suboptimally controlled. -Patient desires no medication.  Follow-up in 3 months.  3.  Bilateral hand coldness, probably due to peripheral vascular disease -Encouraged him to remain active, when he is at work he will try to walk around his building when business is slow.  4.  BPH -Seeing a neurologist in the near future.  5.  Elevated creatinine -Recheck in 3 months.      Procedures: No procedures performed        PMFS History: Patient Active Problem List   Diagnosis Date Noted  . Porokeratosis 04/19/2020  . Vitamin D deficiency 05/24/2019  .  Diabetic neuropathy ([place]) 12/26/2018  . Paroxysmal atrial fibrillation ([place]) 09/30/2018  . Statin intolerance 09/30/2018  . Closed fracture of left side of maxilla with routine healing 04/21/2017  . Depression 09/11/2013  . Hereditary and idiopathic peripheral neuropathy 08/29/2013  . Dysphagia, pharyngoesophageal phase 08/29/2013  . Rotator cuff tear 08/22/2013  . Left shoulder pain 07/31/2013  . Tobacco use disorder 04/19/2013  . Pallor 04/11/2013  . Gastric ulcer 04/11/2013  . Melena 04/11/2013  . Macular degeneration   . Benign essential hypertension   . Hyperlipidemia with target low density lipoprotein (LDL) cholesterol less than 100 mg/dL   . Osteoporosis due to androgen therapy   . Low serum testosterone level   . BPH (benign prostatic hyperplasia)   . DM (diabetes  mellitus) type II uncontrolled with eye manifestation ([place])    Past Medical History:  Diagnosis Date  . Arthritis   . Benign essential hypertension   . Blepharitis, squamous    Right Eye   . Blindness   . BPH (benign prostatic hyperplasia)   . DM (diabetes mellitus) type II uncontrolled with eye manifestation ([facility])    fasting 110s  . Hyperlipidemia LDL goal < 100   . Kidney stones   . Low serum testosterone level   . Macular degeneration   . Osteoporosis due to androgen therapy   . Ulcer    gastric    Family History  Problem Relation Age of Onset  . Alzheimer's disease Mother   . Colon cancer Father   . Diabetes Sister     Past Surgical History:  Procedure Laterality Date  . [place]  . BACK SURGERY  03/2006   Removed Rods Dr.Nitka   . CARPAL TUNNEL RELEASE  08/2006  . CARPAL TUNNEL RELEASE  08/2006   Dr.Nitka   . CATARACT EXTRACTION  02/21/2007   Dr.Epps   . EYE SURGERY  2013   Right eye every 8 weeks   . KIDNEY STONE SURGERY  08/11/2007   Dr.Tannenbaum   . LITHOTRIPSY  08/11/2007   Dr.Nesi  . REFRACTIVE SURGERY  09/2005   Macular Degeneration Dr.Mathews   . 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 Smoking status: Current Every Day Smoker    Packs/day: 1.50    Years: 59.00    Pack years: 88.50    Types: Cigarettes  . Smokeless tobacco: Never Used  Substance  and Sexual Activity  . Alcohol use: No    Alcohol/week: 0.0 standard drinks  . Drug use: No  . Sexual activity: Not on file

## 2020-06-20 DIAGNOSIS — R3912 Poor urinary stream: Secondary | ICD-10-CM | POA: Diagnosis not present

## 2020-06-20 DIAGNOSIS — N401 Enlarged prostate with lower urinary tract symptoms: Secondary | ICD-10-CM | POA: Diagnosis not present

## 2020-06-20 DIAGNOSIS — N5201 Erectile dysfunction due to arterial insufficiency: Secondary | ICD-10-CM | POA: Diagnosis not present

## 2020-06-20 DIAGNOSIS — R350 Frequency of micturition: Secondary | ICD-10-CM | POA: Diagnosis not present

## 2020-07-05 ENCOUNTER — Encounter: Payer: Self-pay | Admitting: Podiatry

## 2020-07-05 ENCOUNTER — Other Ambulatory Visit: Payer: Self-pay

## 2020-07-05 ENCOUNTER — Ambulatory Visit: Payer: HMO | Admitting: Podiatry

## 2020-07-05 DIAGNOSIS — M79676 Pain in unspecified toe(s): Secondary | ICD-10-CM | POA: Diagnosis not present

## 2020-07-05 DIAGNOSIS — B351 Tinea unguium: Secondary | ICD-10-CM

## 2020-07-05 DIAGNOSIS — Z860100 Personal history of colon polyps, unspecified: Secondary | ICD-10-CM | POA: Insufficient documentation

## 2020-07-05 DIAGNOSIS — E1142 Type 2 diabetes mellitus with diabetic polyneuropathy: Secondary | ICD-10-CM | POA: Diagnosis not present

## 2020-07-05 DIAGNOSIS — M2141 Flat foot [pes planus] (acquired), right foot: Secondary | ICD-10-CM

## 2020-07-05 DIAGNOSIS — K219 Gastro-esophageal reflux disease without esophagitis: Secondary | ICD-10-CM | POA: Insufficient documentation

## 2020-07-05 DIAGNOSIS — E119 Type 2 diabetes mellitus without complications: Secondary | ICD-10-CM | POA: Insufficient documentation

## 2020-07-05 DIAGNOSIS — M2142 Flat foot [pes planus] (acquired), left foot: Secondary | ICD-10-CM

## 2020-07-05 NOTE — Progress Notes (Signed)
This patient returns to my office for at risk foot care.  This patient requires this care by a professional since this patient will be at risk due to having diabetes with neuropathy.  This patient is unable to cut nails himself since the patient cannot reach his nails.These nails are painful walking and wearing shoes.  This patient presents for at risk foot care today.  General Appearance  Alert, conversant and in no acute stress.  Vascular  Dorsalis pedis and posterior tibial  pulses are palpable  bilaterally.  Capillary return is within normal limits  bilaterally. Temperature is within normal limits  bilaterally.  Neurologic  Senn-Weinstein monofilament wire test diminished/absent   bilaterally. Muscle power within normal limits bilaterally.  Nails Thick disfigured discolored nails with subungual debris  from hallux to fifth toes bilaterally. No evidence of bacterial infection or drainage bilaterally.  Orthopedic  No limitations of motion  feet .  No crepitus or effusions noted.  No bony pathology or digital deformities noted. HAV  B/L.  Skin  normotropic skin no porokeratosis noted sub 5th met left foot.  No signs of infections or ulcers noted.     Onychomycosis  Pain in right toes  Pain in left toes  Porokeratosis  Left foot  Consent was obtained for treatment procedures.   Mechanical debridement of nails 1-5  bilaterally performed with a nail nipper.  Filed with dremel without incident. Debride porokeratosis with # 15 blade.   Return office visit   10 weeks                   Told patient to return for periodic foot care and evaluation due to potential at risk complications.    Gardiner Barefoot DPM

## 2020-07-12 ENCOUNTER — Encounter (INDEPENDENT_AMBULATORY_CARE_PROVIDER_SITE_OTHER): Payer: HMO | Admitting: Ophthalmology

## 2020-07-12 ENCOUNTER — Other Ambulatory Visit: Payer: Self-pay

## 2020-07-12 DIAGNOSIS — H43813 Vitreous degeneration, bilateral: Secondary | ICD-10-CM

## 2020-07-12 DIAGNOSIS — H353231 Exudative age-related macular degeneration, bilateral, with active choroidal neovascularization: Secondary | ICD-10-CM

## 2020-07-12 DIAGNOSIS — I10 Essential (primary) hypertension: Secondary | ICD-10-CM | POA: Diagnosis not present

## 2020-07-12 DIAGNOSIS — H35033 Hypertensive retinopathy, bilateral: Secondary | ICD-10-CM | POA: Diagnosis not present

## 2020-07-18 ENCOUNTER — Telehealth: Payer: Self-pay | Admitting: Family Medicine

## 2020-07-18 NOTE — Telephone Encounter (Signed)
Pt called asking for a CB with the number to [facility] dermatology and he states it was with Dr. Denna Haggard  [phone]

## 2020-07-18 NOTE — Telephone Encounter (Signed)
I called the patient and advised him of [facility]'s phone number and address. His appointment with Dr. Denna Haggard is coming up on 07/30/20.

## 2020-07-30 ENCOUNTER — Encounter: Payer: Self-pay | Admitting: Dermatology

## 2020-07-30 ENCOUNTER — Other Ambulatory Visit: Payer: Self-pay

## 2020-07-30 ENCOUNTER — Ambulatory Visit: Payer: HMO | Admitting: Dermatology

## 2020-07-30 DIAGNOSIS — D1800 Hemangioma unspecified site: Secondary | ICD-10-CM | POA: Diagnosis not present

## 2020-07-30 DIAGNOSIS — L820 Inflamed seborrheic keratosis: Secondary | ICD-10-CM

## 2020-07-30 DIAGNOSIS — D485 Neoplasm of uncertain behavior of skin: Secondary | ICD-10-CM | POA: Diagnosis not present

## 2020-07-30 DIAGNOSIS — Z1283 Encounter for screening for malignant neoplasm of skin: Secondary | ICD-10-CM | POA: Diagnosis not present

## 2020-07-30 NOTE — Patient Instructions (Signed)

## 2020-08-07 ENCOUNTER — Encounter: Payer: Self-pay | Admitting: Dermatology

## 2020-08-07 NOTE — Progress Notes (Signed)
   New Patient   Subjective  Scott Willis is a 80 y.o. male who presents for the following: Annual Exam (Spot on forehead x 6 years- no itch no bleed, right eyelid- prefers eye doctor to remove it, chest x years- ? Cyst & right cheek- dark spot, right hip - scabs- comes and goes but wont heal).  Annual skin examination Location:  Duration:  Quality: Growth of lesion on forehead Associated Signs/Symptoms: Modifying Factors:  Severity:  Timing: Context:    The following portions of the chart were reviewed this encounter and updated as appropriate:  Tobacco  Allergies  Meds  Problems  Med Hx  Surg Hx  Fam Hx      Objective  Well appearing patient in no apparent distress; mood and affect are within normal limits. Objective  Left Breast: Full  body skin examination.  Needs biopsy of enlarging lesion left forehead.  No other atypical spots found.  Objective  Right Upper Back: Multiple red raised smooth 1 to 3 mm lesions  Objective  Right Hip: Slightly inflamed tan-pink keratotic flattopped papule; dermoscopy compatible.  Images    Objective  Mid Forehead: 2.5 cm irregularly shaped keratotic nodule       A full examination was performed including scalp, head, eyes, ears, nose, lips, neck, chest, axillae, abdomen, back, buttocks, bilateral upper extremities, bilateral lower extremities, hands, feet, fingers, toes, fingernails, and toenails. All findings within normal limits unless otherwise noted below.   Assessment & Plan  Encounter for screening for malignant neoplasm of skin Left Breast  Annual skin examination.  Hemangioma, unspecified site Right Upper Back  Intervention not indicated.  Seborrheic keratosis, inflamed Right Hip  Recheck as needed change  Neoplasm of uncertain behavior of skin Mid Forehead  Skin / nail biopsy Type of biopsy: tangential   Informed consent: discussed and consent obtained   Timeout: patient name, date of birth,  surgical site, and procedure verified   Procedure prep:  Patient was prepped and draped in usual sterile fashion (Non sterile) Prep type:  Chlorhexidine Anesthesia: the lesion was anesthetized in a standard fashion   Anesthetic:  1% lidocaine w/ epinephrine 1-100,000 local infiltration Instrument used: flexible razor blade   Outcome: patient tolerated procedure well   Post-procedure details: wound care instructions given    Destruction of lesion Complexity: simple   Destruction method: electrodesiccation and curettage   Informed consent: discussed and consent obtained   Timeout:  patient name, date of birth, surgical site, and procedure verified Anesthesia: the lesion was anesthetized in a standard fashion   Anesthetic:  1% lidocaine w/ epinephrine 1-100,000 local infiltration Curettage performed in three different directions: Yes   Curettage cycles:  1 Margin per side (cm):  0.1 Final wound size (cm):  1.8 Hemostasis achieved with:  aluminum chloride Outcome: patient tolerated procedure well with no complications   Post-procedure details: wound care instructions given    Specimen 1 - Surgical pathology Differential Diagnosis: r/o sk- txpbx  Check Margins: No  Lesion has historically grown and worries patient so we will obtain shave biopsy.  Skin cancer screening performed today.

## 2020-08-20 ENCOUNTER — Ambulatory Visit (INDEPENDENT_AMBULATORY_CARE_PROVIDER_SITE_OTHER): Payer: HMO | Admitting: Family Medicine

## 2020-08-20 ENCOUNTER — Other Ambulatory Visit: Payer: Self-pay

## 2020-08-20 VITALS — BP 180/64 | HR 59 | Ht 70.5 in | Wt 145.4 lb

## 2020-08-20 DIAGNOSIS — E559 Vitamin D deficiency, unspecified: Secondary | ICD-10-CM | POA: Diagnosis not present

## 2020-08-20 DIAGNOSIS — I10 Essential (primary) hypertension: Secondary | ICD-10-CM | POA: Diagnosis not present

## 2020-08-20 DIAGNOSIS — E785 Hyperlipidemia, unspecified: Secondary | ICD-10-CM

## 2020-08-20 DIAGNOSIS — E1142 Type 2 diabetes mellitus with diabetic polyneuropathy: Secondary | ICD-10-CM | POA: Diagnosis not present

## 2020-08-20 DIAGNOSIS — N400 Enlarged prostate without lower urinary tract symptoms: Secondary | ICD-10-CM | POA: Diagnosis not present

## 2020-08-20 DIAGNOSIS — R748 Abnormal levels of other serum enzymes: Secondary | ICD-10-CM | POA: Diagnosis not present

## 2020-08-20 DIAGNOSIS — E1139 Type 2 diabetes mellitus with other diabetic ophthalmic complication: Secondary | ICD-10-CM

## 2020-08-20 DIAGNOSIS — K5900 Constipation, unspecified: Secondary | ICD-10-CM

## 2020-08-20 DIAGNOSIS — E1165 Type 2 diabetes mellitus with hyperglycemia: Secondary | ICD-10-CM | POA: Diagnosis not present

## 2020-08-20 DIAGNOSIS — R200 Anesthesia of skin: Secondary | ICD-10-CM

## 2020-08-20 DIAGNOSIS — IMO0002 Reserved for concepts with insufficient information to code with codable children: Secondary | ICD-10-CM

## 2020-08-20 MED ORDER — GOLYTELY 236 G PO SOLR: ORAL | 11 refills | Status: DC

## 2020-08-20 NOTE — Progress Notes (Signed)
Office Visit Note   Patient: Scott Willis           Date of Birth: [birth year]           MRN: [id number] Visit Date: 08/20/2020 Requested by: Eunice Blase, MD [address] [place],  [place] [address] PCP: Eunice Blase, MD  Subjective: Chief Complaint  Patient presents with  . Other    3 months f/u: hands are still cold and numb at times. Requests hgb A1C. Change in bowel movements.    HPI: He is here for routine monitoring of medical conditions.  He has 2 concerns today.  His hands have been intermittently going numb, especially at night.  If he shakes them out, the symptoms improved.  He has had some bowel leakage in the past month or 2.  He has changed his diet, and is not having bowel movements like he used to, he is often having to strain to get things out.  He does not monitor blood sugars or his blood pressure.  He continues to have cold sensation in his hands and feet.  No other change in symptoms.                ROS:   All other systems were reviewed and are negative.  Objective: Vital Signs: BP (!) 180/64   Pulse (!) 59   Ht 5' 10.5" (1.791 m)   Wt 145 lb 6.4 oz (66 kg)   BMI 20.57 kg/m   Physical Exam:  General:  Alert and oriented, in no acute distress. Pulm:  Breathing unlabored. Psy:  Normal mood, congruent affect.  HEENT: No carotid bruits.  No thyromegaly. CV: Regular rate and rhythm without murmurs, rubs, or gallops.  No peripheral edema.  2+ radial and posterior tibial pulses. Lungs: Clear to auscultation throughout with no wheezing or areas of consolidation. Extremities: Positive Tinel's at both carpal tunnels. Abdomen: Nontender, bowel sounds are active.     Imaging: No results found.  Assessment & Plan: 1.  Diabetes, has been under good control. -Recheck labs today.  2.  Hypertension, still suboptimally controlled.  Asymptomatic. -No changes today.  Return in 3 months.  3.  Bilateral hand numbness, suspect recurrent carpal  tunnel syndrome -Night splints.  4.  Intestinal leakage, suspect obstipation -Polyethylene glycol until normal bowel movements.  5.  BPH, vitamin D deficiency -Labs today.     Procedures: No procedures performed        PMFS History: Patient Active Problem List   Diagnosis Date Noted  . Gastro-esophageal reflux disease without esophagitis 07/05/2020  . Personal history of colonic polyps 07/05/2020  . Type 2 diabetes mellitus without complications ([place]) 41/32/4401  . Porokeratosis 04/19/2020  . Vitamin D deficiency 05/24/2019  . Diabetic neuropathy ([place]) 12/26/2018  . Paroxysmal atrial fibrillation ([place]) 09/30/2018  . Statin intolerance 09/30/2018  . Closed fracture of left side of maxilla with routine healing 04/21/2017  . Depression 09/11/2013  . Hereditary and idiopathic peripheral neuropathy 08/29/2013  . Dysphagia, pharyngoesophageal phase 08/29/2013  . Rotator cuff tear 08/22/2013  . Left shoulder pain 07/31/2013  . Tobacco use disorder 04/19/2013  . Pallor 04/11/2013  . Gastric ulcer 04/11/2013  . Melena 04/11/2013  . Macular degeneration   . Benign essential hypertension   . Hyperlipidemia with target low density lipoprotein (LDL) cholesterol less than 100 mg/dL   . Osteoporosis due to androgen therapy   . Low serum testosterone level   . BPH (benign prostatic hyperplasia)   . DM (  diabetes mellitus) type II uncontrolled with eye manifestation ([place])    Past Medical History:  Diagnosis Date  . Arthritis   . Benign essential hypertension   . Blepharitis, squamous    Right Eye   . Blindness   . BPH (benign prostatic hyperplasia)   . DM (diabetes mellitus) type II uncontrolled with eye manifestation ([facility])    fasting 110s  . Hyperlipidemia LDL goal < 100   . Kidney stones   . Low serum testosterone level   . Macular degeneration   . Osteoporosis due to androgen therapy   . Ulcer    gastric    Family History  Problem Relation Age of Onset  .  Alzheimer's disease Mother   . Colon cancer Father   . Diabetes Sister     Past Surgical History:  Procedure Laterality Date  . [place]  . BACK SURGERY  03/2006   Removed Rods Dr.Nitka   . CARPAL TUNNEL RELEASE  08/2006  . CARPAL TUNNEL RELEASE  08/2006   Dr.Nitka   . CATARACT EXTRACTION  02/21/2007   Dr.Epps   . EYE SURGERY  2013   Right eye every 8 weeks   . KIDNEY STONE SURGERY  08/11/2007   Dr.Tannenbaum   . LITHOTRIPSY  08/11/2007   Dr.Nesi  . REFRACTIVE SURGERY  09/2005   Macular Degeneration Dr.Mathews   . 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 Smoking status: Current Every Day Smoker    Packs/day: 1.50    Years: 59.00    Pack years: 88.50    Types: Cigarettes  . Smokeless tobacco: Never Used  Substance and Sexual Activity  . Alcohol use: No    Alcohol/week: 0.0 standard drinks  . Drug use: No  . Sexual activity: Not on file

## 2020-08-21 ENCOUNTER — Other Ambulatory Visit: Payer: Self-pay | Admitting: Family Medicine

## 2020-08-21 ENCOUNTER — Telehealth: Payer: Self-pay | Admitting: Family Medicine

## 2020-08-21 LAB — CBC WITH DIFFERENTIAL/PLATELET
Absolute Monocytes: 716 cells/uL (ref 200–950)
Basophils Absolute: 54 cells/uL (ref 0–200)
Basophils Relative: 0.7 %
Eosinophils Absolute: 154 cells/uL (ref 15–500)
Eosinophils Relative: 2 %
HCT: 40.5 % (ref 38.5–50.0)
Hemoglobin: 13.7 g/dL (ref 13.2–17.1)
Lymphs Abs: 2302 cells/uL (ref 850–3900)
MCH: 32 pg (ref 27.0–33.0)
MCHC: 33.8 g/dL (ref 32.0–36.0)
MCV: 94.6 fL (ref 80.0–100.0)
MPV: 12 fL (ref 7.5–12.5)
Monocytes Relative: 9.3 %
Neutro Abs: 4474 cells/uL (ref 1500–7800)
Neutrophils Relative %: 58.1 %
Platelets: 178 10*3/uL (ref 140–400)
RBC: 4.28 10*6/uL (ref 4.20–5.80)
RDW: 12.6 % (ref 11.0–15.0)
Total Lymphocyte: 29.9 %
WBC: 7.7 10*3/uL (ref 3.8–10.8)

## 2020-08-21 LAB — COMPREHENSIVE METABOLIC PANEL
AG Ratio: 1.4 (calc) (ref 1.0–2.5)
ALT: 9 U/L (ref 9–46)
AST: 19 U/L (ref 10–35)
Albumin: 4.3 g/dL (ref 3.6–5.1)
Alkaline phosphatase (APISO): 46 U/L (ref 35–144)
BUN/Creatinine Ratio: 15 (calc) (ref 6–22)
BUN: 20 mg/dL (ref 7–25)
CO2: 29 mmol/L (ref 20–32)
Calcium: 9.4 mg/dL (ref 8.6–10.3)
Chloride: 102 mmol/L (ref 98–110)
Creat: 1.31 mg/dL — ABNORMAL HIGH (ref 0.70–1.18)
Globulin: 3.1 g/dL (calc) (ref 1.9–3.7)
Glucose, Bld: 99 mg/dL (ref 65–99)
Potassium: 4.8 mmol/L (ref 3.5–5.3)
Sodium: 138 mmol/L (ref 135–146)
Total Bilirubin: 0.7 mg/dL (ref 0.2–1.2)
Total Protein: 7.4 g/dL (ref 6.1–8.1)

## 2020-08-21 LAB — MICROALBUMIN, URINE: Microalb, Ur: 0.2 mg/dL

## 2020-08-21 LAB — LIPID PANEL
Cholesterol: 216 mg/dL — ABNORMAL HIGH (ref <200)
HDL: 63 mg/dL (ref >=40)
LDL Cholesterol (Calc): 137 mg/dL (calc) — ABNORMAL HIGH
Non-HDL Cholesterol (Calc): 153 mg/dL (calc) — ABNORMAL HIGH (ref <130)
Total CHOL/HDL Ratio: 3.4 (calc) (ref <5.0)
Triglycerides: 70 mg/dL (ref <150)

## 2020-08-21 LAB — HEMOGLOBIN A1C
Hgb A1c MFr Bld: 5.9 % of total Hgb — ABNORMAL HIGH (ref <5.7)
Mean Plasma Glucose: 123 mg/dL
eAG (mmol/L): 6.8 mmol/L

## 2020-08-21 LAB — TSH: TSH: 3.46 m[IU]/L (ref 0.40–4.50)

## 2020-08-21 LAB — PSA: PSA: 0.76 ng/mL

## 2020-08-21 LAB — VITAMIN D 25 HYDROXY (VIT D DEFICIENCY, FRACTURES): Vit D, 25-Hydroxy: 57 ng/mL (ref 30–100)

## 2020-08-21 NOTE — Telephone Encounter (Signed)
Labs show:  Blood counts, prostate, thyroid studies and vitamin D are all normal.  Kidney function is holding steady.  A1C looks good at 5.9.  Lipids are slightly elevated compared to last time.

## 2020-08-22 NOTE — Telephone Encounter (Signed)
I called and advised the patient of his results. He said he will try the GoLytely, but he has had problems keeping it down in the past. Advised him to let us know if he cannot tolerate it, for possible change to something else.

## 2020-09-10 ENCOUNTER — Telehealth: Payer: Self-pay | Admitting: Family Medicine

## 2020-09-10 NOTE — Telephone Encounter (Signed)
Pt called stating he heard about a rx for neurotropy and cant remember the name and it's supposed to eliminate the pain so he would like to try it. The pt would like a CB to discuss it further.   [phone]

## 2020-09-10 NOTE — Telephone Encounter (Signed)
I called the patient. He just wanted to know the name of the medication he was on for neuropathy that is also used as an antidepressant. I checked his medication list back to 2015 -- it was Effexor XR and then later plain Effexor. He does not want this medication sent in, just the name of the medication.

## 2020-09-13 ENCOUNTER — Other Ambulatory Visit: Payer: Self-pay

## 2020-09-13 ENCOUNTER — Ambulatory Visit: Payer: HMO | Admitting: Podiatry

## 2020-09-13 ENCOUNTER — Encounter: Payer: Self-pay | Admitting: Podiatry

## 2020-09-13 DIAGNOSIS — M2142 Flat foot [pes planus] (acquired), left foot: Secondary | ICD-10-CM

## 2020-09-13 DIAGNOSIS — M79676 Pain in unspecified toe(s): Secondary | ICD-10-CM

## 2020-09-13 DIAGNOSIS — E1142 Type 2 diabetes mellitus with diabetic polyneuropathy: Secondary | ICD-10-CM | POA: Diagnosis not present

## 2020-09-13 DIAGNOSIS — B351 Tinea unguium: Secondary | ICD-10-CM | POA: Diagnosis not present

## 2020-09-13 DIAGNOSIS — M2141 Flat foot [pes planus] (acquired), right foot: Secondary | ICD-10-CM

## 2020-09-13 NOTE — Progress Notes (Signed)
This patient returns to my office for at risk foot care.  This patient requires this care by a professional since this patient will be at risk due to having diabetes with neuropathy.  This patient is unable to cut nails himself since the patient cannot reach his nails.These nails are painful walking and wearing shoes.  This patient presents for at risk foot care today.  General Appearance  Alert, conversant and in no acute stress.  Vascular  Dorsalis pedis and posterior tibial  pulses are palpable  bilaterally.  Capillary return is within normal limits  bilaterally. Temperature is within normal limits  bilaterally.  Neurologic  Senn-Weinstein monofilament wire test diminished/absent   bilaterally. Muscle power within normal limits bilaterally.  Nails Thick disfigured discolored nails with subungual debris  from hallux to fifth toes bilaterally. No evidence of bacterial infection or drainage bilaterally.  Orthopedic  No limitations of motion  feet .  No crepitus or effusions noted.  No bony pathology or digital deformities noted. HAV  B/L.  Skin  normotropic skin no porokeratosis noted sub 5th met left foot.  No signs of infections or ulcers noted.     Onychomycosis  Pain in right toes  Pain in left toes  Porokeratosis  Left foot  Consent was obtained for treatment procedures.   Mechanical debridement of nails 1-5  bilaterally performed with a nail nipper.  Filed with dremel without incident. Debride porokeratosis with # 15 blade.   Return office visit   10 weeks                   Told patient to return for periodic foot care and evaluation due to potential at risk complications.    Gardiner Barefoot DPM

## 2020-09-20 ENCOUNTER — Other Ambulatory Visit: Payer: Self-pay

## 2020-09-20 ENCOUNTER — Encounter (INDEPENDENT_AMBULATORY_CARE_PROVIDER_SITE_OTHER): Payer: HMO | Admitting: Ophthalmology

## 2020-09-20 DIAGNOSIS — H35033 Hypertensive retinopathy, bilateral: Secondary | ICD-10-CM

## 2020-09-20 DIAGNOSIS — H353231 Exudative age-related macular degeneration, bilateral, with active choroidal neovascularization: Secondary | ICD-10-CM

## 2020-09-20 DIAGNOSIS — H43813 Vitreous degeneration, bilateral: Secondary | ICD-10-CM

## 2020-09-20 DIAGNOSIS — I10 Essential (primary) hypertension: Secondary | ICD-10-CM | POA: Diagnosis not present

## 2020-10-03 ENCOUNTER — Other Ambulatory Visit: Payer: Self-pay

## 2020-10-03 ENCOUNTER — Other Ambulatory Visit: Payer: Self-pay | Admitting: Dermatology

## 2020-10-03 ENCOUNTER — Encounter: Payer: Self-pay | Admitting: Dermatology

## 2020-10-03 ENCOUNTER — Ambulatory Visit (INDEPENDENT_AMBULATORY_CARE_PROVIDER_SITE_OTHER): Payer: HMO | Admitting: Dermatology

## 2020-10-03 DIAGNOSIS — Z8614 Personal history of Methicillin resistant Staphylococcus aureus infection: Secondary | ICD-10-CM | POA: Diagnosis not present

## 2020-10-03 DIAGNOSIS — L570 Actinic keratosis: Secondary | ICD-10-CM | POA: Diagnosis not present

## 2020-10-03 DIAGNOSIS — D485 Neoplasm of uncertain behavior of skin: Secondary | ICD-10-CM

## 2020-10-03 MED ORDER — TRIAMCINOLONE ACETONIDE 40 MG/ML IJ SUSP
40.0000 mg | Freq: Once | INTRAMUSCULAR | Status: AC
  Administered 2020-10-03: 40 mg

## 2020-10-03 NOTE — Patient Instructions (Signed)

## 2020-10-03 NOTE — Progress Notes (Addendum)
   Follow-Up Visit   Subjective  Scott Willis is a 80 y.o. male who presents for the following: Procedure (Right hip scab like x years + mrsa years ago).  Nonhealing spot on right hip Location:  Duration:  Quality:  Associated Signs/Symptoms: Modifying Factors:  Severity:  Timing: Context:   Objective  Well appearing patient in no apparent distress; mood and affect are within normal limits. Right Hip (side) - Posterior Waxy pink 2.7cm lesion with central keratotic 23mm papule, diagnosis uncertain      A focused examination was performed including head, neck, right hip and buttocks. Relevant physical exam findings are noted in the Assessment and Plan.   Assessment & Plan    Neoplasm of uncertain behavior of skin Right Hip (side) - Posterior  Skin / nail biopsy Type of biopsy: tangential   Informed consent: discussed and consent obtained   Timeout: patient name, date of birth, surgical site, and procedure verified   Anesthesia: the lesion was anesthetized in a standard fashion   Anesthetic:  1% lidocaine w/ epinephrine 1-100,000 local infiltration Instrument used: flexible razor blade   Hemostasis achieved with: aluminum chloride and electrodesiccation   Outcome: patient tolerated procedure well   Post-procedure details: wound care instructions given    Destruction of lesion Complexity: simple   Destruction method: electrodesiccation and curettage   Informed consent: discussed and consent obtained   Timeout:  patient name, date of birth, surgical site, and procedure verified Anesthesia: the lesion was anesthetized in a standard fashion   Anesthetic:  1% lidocaine w/ epinephrine 1-100,000 local infiltration Curettage performed in three different directions: Yes   Curettage cycles:  3 Margin per side (cm):  0.1 Final wound size (cm):  3.5 Hemostasis achieved with:  aluminum chloride Outcome: patient tolerated procedure well with no complications    Post-procedure details: wound care instructions given    Specimen 1 - Surgical pathology Differential Diagnosis: bcc scc curet and kenalog40 Check Margins: No  This is an unusual lesion.  Although the patient relates this to her history of MRSA, clinically this appears to be an integrative inflammation (perhaps neurodermatitis) plus some type of growth (wart versus Bowen's).  After wide shave biopsy the base was treated with curettage plus cautery.  Related Medications triamcinolone acetonide (KENALOG-40) injection 40 mg      I, Lavonna Monarch, MD, have reviewed all documentation for this visit.  The documentation on 11/05/20 for the exam, diagnosis, procedures, and orders are all accurate and complete.

## 2020-10-08 ENCOUNTER — Encounter: Payer: Self-pay | Admitting: Dermatology

## 2020-10-10 ENCOUNTER — Telehealth: Payer: Self-pay | Admitting: Dermatology

## 2020-10-10 NOTE — Telephone Encounter (Signed)
Patient is calling for pathology results from last visit with Stuart Tafeen, MD 

## 2020-10-10 NOTE — Telephone Encounter (Signed)
Patient aware to call back Tuesday the are not back

## 2020-11-01 ENCOUNTER — Telehealth: Payer: Self-pay | Admitting: Family Medicine

## 2020-11-01 NOTE — Telephone Encounter (Signed)
Pt called requesting a call back from [employer]. Pt states he has questions about an immunizations from Dr. Junius Roads. Please call pt at [phone].

## 2020-11-01 NOTE — Telephone Encounter (Signed)
Hold for Terri

## 2020-11-04 ENCOUNTER — Telehealth: Payer: Self-pay | Admitting: *Deleted

## 2020-11-04 MED ORDER — MUPIROCIN 2 % EX OINT: 1.0000 "application " | TOPICAL_OINTMENT | Freq: Two times a day (BID) | CUTANEOUS | 1 refills | Status: AC

## 2020-11-04 NOTE — Telephone Encounter (Signed)
Path to patient. Lesion still sore after almost 4 weeks since biopsy. Told patient will speak with Dr.Tafeen on what he can do.

## 2020-11-04 NOTE — Telephone Encounter (Signed)
Phone call to patient to let him know we are going to call in mupirocin and he will use 2 times daily and then call with an update.

## 2020-11-04 NOTE — Telephone Encounter (Signed)
I called the patient. For insurance purposes, he needs to know the dates of his last tetanus, hep B and shingles vaccines. Advised him that we only have the tetanus on file. He had Dtap on 09/12/2006.

## 2020-11-19 ENCOUNTER — Encounter: Payer: Self-pay | Admitting: Family Medicine

## 2020-11-19 ENCOUNTER — Ambulatory Visit (INDEPENDENT_AMBULATORY_CARE_PROVIDER_SITE_OTHER): Payer: HMO | Admitting: Family Medicine

## 2020-11-19 ENCOUNTER — Other Ambulatory Visit: Payer: Self-pay

## 2020-11-19 VITALS — BP 191/63 | HR 63 | Ht 70.5 in | Wt 145.4 lb

## 2020-11-19 DIAGNOSIS — E1165 Type 2 diabetes mellitus with hyperglycemia: Secondary | ICD-10-CM

## 2020-11-19 DIAGNOSIS — I10 Essential (primary) hypertension: Secondary | ICD-10-CM

## 2020-11-19 DIAGNOSIS — F17200 Nicotine dependence, unspecified, uncomplicated: Secondary | ICD-10-CM

## 2020-11-19 DIAGNOSIS — E1139 Type 2 diabetes mellitus with other diabetic ophthalmic complication: Secondary | ICD-10-CM | POA: Diagnosis not present

## 2020-11-19 DIAGNOSIS — R748 Abnormal levels of other serum enzymes: Secondary | ICD-10-CM

## 2020-11-19 DIAGNOSIS — IMO0002 Reserved for concepts with insufficient information to code with codable children: Secondary | ICD-10-CM

## 2020-11-19 DIAGNOSIS — E1142 Type 2 diabetes mellitus with diabetic polyneuropathy: Secondary | ICD-10-CM | POA: Diagnosis not present

## 2020-11-19 NOTE — Patient Instructions (Signed)
    Dr. [place]:  [email or web address]  Dr. Carlota Raspberry:  [email or web address]  Dr. Ethlyn Gallery:  [email or web address]  Vickie Hensen:  [email or web address]

## 2020-11-19 NOTE — Progress Notes (Signed)
Office Visit Note   Patient: Scott Willis           Date of Birth: 03-Sep-1940           MRN: [id number] Visit Date: 11/19/2020 Requested by: Eunice Blase, MD [address] [place],  [place] [address] PCP: Eunice Blase, MD  Subjective: Chief Complaint  Patient presents with   HA1c     HPI: He is here for routine monitoring of medical conditions.  When he checks his blood pressure elsewhere, it is usually closer to normal.  He is asymptomatic from a blood pressure standpoint.  He does not check blood sugars.  He notes that his hand and foot numbness is better when he tries to walk during the day and do some light weight resistance exercises with his arms.  He has chronic kidney disease which needs to be monitored.  It has been holding steady.  He continues to have some bowel leakage intermittently.                ROS:   All other systems were reviewed and are negative.  Objective: Vital Signs: BP (!) 191/63 (BP Location: Left Arm, Patient Position: Sitting)   Pulse 63   Ht 5' 10.5" (1.791 m)   Wt 145 lb 6.4 oz (66 kg)   BMI 20.57 kg/m   Physical Exam:  General:  Alert and oriented, in no acute distress. Pulm:  Breathing unlabored. Psy:  Normal mood, congruent affect.  Neck: No carotid bruits. CV: Regular rate and rhythm without murmurs, rubs, or gallops.  No peripheral edema.  2+ radial and posterior tibial pulses. Lungs: Clear to auscultation throughout with no wheezing or areas of consolidation.    Imaging: No results found.  Assessment & Plan: Hypertension, suboptimally controlled. -No changes made today.  Follow-up in 3 months with new PCP.  2.  Diabetes - A1c and microalbumin today.  3.  Chronic kidney disease - Recheck today.       Procedures: No procedures performed        PMFS History: Patient Active Problem List   Diagnosis Date Noted   Gastro-esophageal reflux disease without esophagitis 07/05/2020   Personal history of  colonic polyps 07/05/2020   Type 2 diabetes mellitus without complications ([facility]) [id number]   Porokeratosis 04/19/2020   Vitamin D deficiency 05/24/2019   Diabetic neuropathy ([facility]) 12/26/2018   Paroxysmal atrial fibrillation ([facility]) 09/30/2018   Statin intolerance 09/30/2018   Closed fracture of left side of maxilla with routine healing 04/21/2017   Depression 09/11/2013   Hereditary and idiopathic peripheral neuropathy 08/29/2013   Dysphagia, pharyngoesophageal phase 08/29/2013   Rotator cuff tear 08/22/2013   Left shoulder pain 07/31/2013   Tobacco use disorder 04/19/2013   [place] 04/11/2013   Gastric ulcer 04/11/2013   Melena 04/11/2013   Macular degeneration    Benign essential hypertension    Hyperlipidemia with target low density lipoprotein (LDL) cholesterol less than 100 mg/dL    Osteoporosis due to androgen therapy    Low serum testosterone level    BPH (benign prostatic hyperplasia)    DM (diabetes mellitus) type II uncontrolled with eye manifestation ([facility])    Past Medical History:  Diagnosis Date   Arthritis    Benign essential hypertension    Blepharitis, squamous    Right Eye    Blindness    BPH (benign prostatic hyperplasia)    DM (diabetes mellitus) type II uncontrolled with eye manifestation ([facility])    fasting 110s  Hyperlipidemia LDL goal < 100    Kidney stones    Low serum testosterone level    Macular degeneration    Osteoporosis due to androgen therapy    Ulcer    gastric    Family History  Problem Relation Age of Onset   Alzheimer's disease Mother    Colon cancer Father    Diabetes Sister     Past Surgical History:  Procedure Laterality Date   BACK SURGERY  1998   [place] SURGERY  03/2006   Removed [place] Dr.Nitka    CARPAL TUNNEL RELEASE  08/2006   CARPAL TUNNEL RELEASE  08/2006   [facility]    CATARACT EXTRACTION  02/21/2007   Dr.Epps    EYE SURGERY  2013   Right eye every 8 weeks    KIDNEY STONE SURGERY  08/11/2007    Dr.Tannenbaum    LITHOTRIPSY  08/11/2007   Dr.Nesi   REFRACTIVE SURGERY  09/2005   Macular Degeneration Dr.Mathews    SHOULDER ARTHROSCOPY WITH SUBACROMIAL DECOMPRESSION, ROTATOR CUFF REPAIR AND BICEP TENDON REPAIR Left 08/22/2013   Procedure:  LEFT SHOULDER DIAGNOSTIC OPERATIVE ARTHROSCOPY ,EXTENSIVE DEBRIDEMENT, BICEPS RELEASE AND TENODESIS, ROTATOR CUFF REPAIR, SUBACROMIAL DECOMPRESSION;  Surgeon: Meredith Pel, MD;  Location: [facility];  Service: Orthopedics;  Laterality: Left;  LEFT SHOULDER DOA,EXTENSIVE DEBRIDEMENT, BICEPS RELEASE AND TENODESIS, ROTATOR CUFF REPAIR, SUBACROMIAL DECOMPRESSION,   Social History   Occupational History    Employer: [employer]    Comment: owner of printing company--[address]  Tobacco Use   Smoking status: Every Day    Packs/day: 1.50    Years: 59.00    Pack years: 88.50    Types: Cigarettes   Smokeless tobacco: Never  Substance and Sexual Activity   Alcohol use: No    Alcohol/week: 0.0 standard drinks   Drug use: No   Sexual activity: Not on file

## 2020-11-20 ENCOUNTER — Telehealth: Payer: Self-pay | Admitting: Family Medicine

## 2020-11-20 ENCOUNTER — Encounter: Payer: Self-pay | Admitting: Family Medicine

## 2020-11-20 LAB — HEMOGLOBIN A1C
Hgb A1c MFr Bld: 5.9 % of total Hgb — ABNORMAL HIGH (ref <5.7)
Mean Plasma Glucose: 123 mg/dL
eAG (mmol/L): 6.8 mmol/L

## 2020-11-20 LAB — CBC WITH DIFFERENTIAL/PLATELET
Absolute Monocytes: 790 cells/uL (ref 200–950)
Basophils Absolute: 71 cells/uL (ref 0–200)
Basophils Relative: 0.9 %
Eosinophils Absolute: 142 cells/uL (ref 15–500)
Eosinophils Relative: 1.8 %
HCT: 40.3 % (ref 38.5–50.0)
Hemoglobin: 12.9 g/dL — ABNORMAL LOW (ref 13.2–17.1)
Lymphs Abs: 2607 cells/uL (ref 850–3900)
MCH: 30.2 pg (ref 27.0–33.0)
MCHC: 32 g/dL (ref 32.0–36.0)
MCV: 94.4 fL (ref 80.0–100.0)
MPV: 12 fL (ref 7.5–12.5)
Monocytes Relative: 10 %
Neutro Abs: 4290 cells/uL (ref 1500–7800)
Neutrophils Relative %: 54.3 %
Platelets: 189 10*3/uL (ref 140–400)
RBC: 4.27 10*6/uL (ref 4.20–5.80)
RDW: 12.9 % (ref 11.0–15.0)
Total Lymphocyte: 33 %
WBC: 7.9 10*3/uL (ref 3.8–10.8)

## 2020-11-20 LAB — MICROALBUMIN, URINE: Microalb, Ur: <0.2 mg/dL

## 2020-11-20 LAB — BASIC METABOLIC PANEL
BUN/Creatinine Ratio: 16 (calc) (ref 6–22)
BUN: 22 mg/dL (ref 7–25)
CO2: 29 mmol/L (ref 20–32)
Calcium: 9.1 mg/dL (ref 8.6–10.3)
Chloride: 105 mmol/L (ref 98–110)
Creat: 1.39 mg/dL — ABNORMAL HIGH (ref 0.70–1.22)
Glucose, Bld: 107 mg/dL — ABNORMAL HIGH (ref 65–99)
Potassium: 4.4 mmol/L (ref 3.5–5.3)
Sodium: 140 mmol/L (ref 135–146)

## 2020-11-20 NOTE — Telephone Encounter (Signed)
Results and message sent by mail to the patient's home address.

## 2020-11-20 NOTE — Telephone Encounter (Signed)
Labs show:  Kidney function/creatinine is holding steady.  Recheck in 4-6 months.  Urine test was normal.  A1C looks good still at 5.9.

## 2020-11-29 ENCOUNTER — Telehealth: Payer: Self-pay | Admitting: Family Medicine

## 2020-11-29 ENCOUNTER — Ambulatory Visit (INDEPENDENT_AMBULATORY_CARE_PROVIDER_SITE_OTHER): Payer: HMO | Admitting: Podiatry

## 2020-11-29 ENCOUNTER — Other Ambulatory Visit: Payer: Self-pay

## 2020-11-29 DIAGNOSIS — B351 Tinea unguium: Secondary | ICD-10-CM

## 2020-11-29 DIAGNOSIS — M79676 Pain in unspecified toe(s): Secondary | ICD-10-CM

## 2020-11-29 DIAGNOSIS — M2141 Flat foot [pes planus] (acquired), right foot: Secondary | ICD-10-CM

## 2020-11-29 DIAGNOSIS — E1142 Type 2 diabetes mellitus with diabetic polyneuropathy: Secondary | ICD-10-CM

## 2020-11-29 DIAGNOSIS — M2142 Flat foot [pes planus] (acquired), left foot: Secondary | ICD-10-CM

## 2020-11-29 NOTE — Progress Notes (Signed)
This patient returns to my office for at risk foot care.  This patient requires this care by a professional since this patient will be at risk due to having diabetes with neuropathy.  This patient is unable to cut nails himself since the patient cannot reach his nails.These nails are painful walking and wearing shoes.  This patient presents for at risk foot care today.  General Appearance  Alert, conversant and in no acute stress.  Vascular  Dorsalis pedis and posterior tibial  pulses are palpable  bilaterally.  Capillary return is within normal limits  bilaterally. Temperature is within normal limits  bilaterally.  Neurologic  Senn-Weinstein monofilament wire test diminished/absent   bilaterally. Muscle power within normal limits bilaterally.  Nails Thick disfigured discolored nails with subungual debris  from hallux to fifth toes bilaterally. No evidence of bacterial infection or drainage bilaterally.  Orthopedic  No limitations of motion  feet .  No crepitus or effusions noted.  No bony pathology or digital deformities noted. HAV  B/L.  Skin  normotropic skin no porokeratosis noted sub 5th met left foot.  No signs of infections or ulcers noted.     Onychomycosis  Pain in right toes  Pain in left toes  Porokeratosis  Left foot  Consent was obtained for treatment procedures.   Mechanical debridement of nails 1-5  bilaterally performed with a nail nipper.  Filed with dremel without incident. Debride porokeratosis with # 15 blade.   Return office visit   10 weeks                   Told patient to return for periodic foot care and evaluation due to potential at risk complications.    Gardiner Barefoot DPM

## 2020-11-29 NOTE — Telephone Encounter (Signed)
Pt would like to know the results of his blood work.   CB [id number]

## 2020-11-29 NOTE — Telephone Encounter (Signed)
I called and advised the patient. 

## 2020-12-05 ENCOUNTER — Encounter (INDEPENDENT_AMBULATORY_CARE_PROVIDER_SITE_OTHER): Payer: HMO | Admitting: Ophthalmology

## 2020-12-05 ENCOUNTER — Other Ambulatory Visit: Payer: Self-pay

## 2020-12-05 DIAGNOSIS — H353231 Exudative age-related macular degeneration, bilateral, with active choroidal neovascularization: Secondary | ICD-10-CM

## 2020-12-05 DIAGNOSIS — I10 Essential (primary) hypertension: Secondary | ICD-10-CM

## 2020-12-05 DIAGNOSIS — H43813 Vitreous degeneration, bilateral: Secondary | ICD-10-CM | POA: Diagnosis not present

## 2020-12-05 DIAGNOSIS — H35033 Hypertensive retinopathy, bilateral: Secondary | ICD-10-CM

## 2021-02-07 ENCOUNTER — Encounter: Payer: Self-pay | Admitting: Podiatry

## 2021-02-07 ENCOUNTER — Other Ambulatory Visit: Payer: Self-pay

## 2021-02-07 ENCOUNTER — Ambulatory Visit (INDEPENDENT_AMBULATORY_CARE_PROVIDER_SITE_OTHER): Payer: HMO | Admitting: Podiatry

## 2021-02-07 DIAGNOSIS — B351 Tinea unguium: Secondary | ICD-10-CM

## 2021-02-07 DIAGNOSIS — M79676 Pain in unspecified toe(s): Secondary | ICD-10-CM

## 2021-02-07 DIAGNOSIS — M2142 Flat foot [pes planus] (acquired), left foot: Secondary | ICD-10-CM | POA: Diagnosis not present

## 2021-02-07 DIAGNOSIS — E1142 Type 2 diabetes mellitus with diabetic polyneuropathy: Secondary | ICD-10-CM

## 2021-02-07 DIAGNOSIS — M2141 Flat foot [pes planus] (acquired), right foot: Secondary | ICD-10-CM

## 2021-02-07 NOTE — Progress Notes (Addendum)
This patient returns to my office for at risk foot care.  This patient requires this care by a professional since this patient will be at risk due to having diabetes with neuropathy.  This patient is unable to cut nails himself since the patient cannot reach his nails.These nails are painful walking and wearing shoes.  This patient presents for at risk foot care today.  Patient states his callus causes occasional pain.  General Appearance  Alert, conversant and in no acute stress.  Vascular  Dorsalis pedis and posterior tibial  pulses are palpable  bilaterally.  Capillary return is within normal limits  bilaterally. Temperature is within normal limits  bilaterally.  Neurologic  Senn-Weinstein monofilament wire test diminished/absent   bilaterally. Muscle power within normal limits bilaterally.  Nails Thick disfigured discolored nails with subungual debris  from hallux to fifth toes bilaterally. No evidence of bacterial infection or drainage bilaterally.  Orthopedic  No limitations of motion  feet .  No crepitus or effusions noted.  No bony pathology or digital deformities noted. HAV  B/L.  Skin  normotropic skin .   porokeratosis noted sub 5th met left foot.  No signs of infections or ulcers noted.     Onychomycosis  Pain in right toes  Pain in left toes  Porokeratosis  Left foot  Consent was obtained for treatment procedures.   Mechanical debridement of nails 1-5  bilaterally performed with a nail nipper.  Filed with dremel without incident. Debride porokeratosis with # 15 blade and followed with dremel tool.   Return office visit   10 weeks                   Told patient to return for periodic foot care and evaluation due to potential at risk complications.    Gardiner Barefoot DPM

## 2021-02-21 ENCOUNTER — Other Ambulatory Visit: Payer: Self-pay

## 2021-02-21 ENCOUNTER — Encounter (INDEPENDENT_AMBULATORY_CARE_PROVIDER_SITE_OTHER): Payer: HMO | Admitting: Ophthalmology

## 2021-02-21 DIAGNOSIS — H353231 Exudative age-related macular degeneration, bilateral, with active choroidal neovascularization: Secondary | ICD-10-CM | POA: Diagnosis not present

## 2021-02-21 DIAGNOSIS — H43813 Vitreous degeneration, bilateral: Secondary | ICD-10-CM | POA: Diagnosis not present

## 2021-02-21 DIAGNOSIS — I10 Essential (primary) hypertension: Secondary | ICD-10-CM | POA: Diagnosis not present

## 2021-02-21 DIAGNOSIS — H35033 Hypertensive retinopathy, bilateral: Secondary | ICD-10-CM | POA: Diagnosis not present

## 2021-04-18 ENCOUNTER — Ambulatory Visit: Payer: HMO | Admitting: Podiatry

## 2021-04-25 ENCOUNTER — Ambulatory Visit (INDEPENDENT_AMBULATORY_CARE_PROVIDER_SITE_OTHER): Payer: HMO | Admitting: Podiatry

## 2021-04-25 ENCOUNTER — Other Ambulatory Visit: Payer: Self-pay

## 2021-04-25 ENCOUNTER — Encounter: Payer: Self-pay | Admitting: Podiatry

## 2021-04-25 DIAGNOSIS — E1142 Type 2 diabetes mellitus with diabetic polyneuropathy: Secondary | ICD-10-CM

## 2021-04-25 DIAGNOSIS — M79676 Pain in unspecified toe(s): Secondary | ICD-10-CM

## 2021-04-25 DIAGNOSIS — B351 Tinea unguium: Secondary | ICD-10-CM | POA: Diagnosis not present

## 2021-04-25 NOTE — Progress Notes (Signed)
This patient returns to my office for at risk foot care.  This patient requires this care by a professional since this patient will be at risk due to having diabetes with neuropathy.  This patient is unable to cut nails himself since the patient cannot reach his nails.These nails are painful walking and wearing shoes.  This patient presents for at risk foot care today.  Patient states his callus causes occasional pain. ? ?General Appearance  Alert, conversant and in no acute stress. ? ?Vascular  Dorsalis pedis and posterior tibial  pulses are palpable  bilaterally.  Capillary return is within normal limits  bilaterally. Temperature is within normal limits  bilaterally. ? ?Neurologic  Senn-Weinstein monofilament wire test diminished/absent   bilaterally. Muscle power within normal limits bilaterally. ? ?Nails Thick disfigured discolored nails with subungual debris  from hallux to fifth toes bilaterally. No evidence of bacterial infection or drainage bilaterally. ? ?Orthopedic  No limitations of motion  feet .  No crepitus or effusions noted.  No bony pathology or digital deformities noted. HAV  B/L. ? ?Skin  normotropic skin .   porokeratosis noted sub 5th met left foot resolved.  No signs of infections or ulcers noted.    ? ?Onychomycosis  Pain in right toes  Pain in left toes   ? ?Consent was obtained for treatment procedures.   Mechanical debridement of nails 1-5  bilaterally performed with a nail nipper.  Filed with dremel without incident.  ? ? ?Return office visit   10 weeks                   Told patient to return for periodic foot care and evaluation due to potential at risk complications.  ? ? ?Redell Nazir DPM  ?

## 2021-05-09 ENCOUNTER — Encounter (INDEPENDENT_AMBULATORY_CARE_PROVIDER_SITE_OTHER): Payer: HMO | Admitting: Ophthalmology

## 2021-05-09 ENCOUNTER — Other Ambulatory Visit: Payer: Self-pay

## 2021-05-09 DIAGNOSIS — I10 Essential (primary) hypertension: Secondary | ICD-10-CM

## 2021-05-09 DIAGNOSIS — H35033 Hypertensive retinopathy, bilateral: Secondary | ICD-10-CM

## 2021-05-09 DIAGNOSIS — H43813 Vitreous degeneration, bilateral: Secondary | ICD-10-CM

## 2021-05-09 DIAGNOSIS — H353231 Exudative age-related macular degeneration, bilateral, with active choroidal neovascularization: Secondary | ICD-10-CM

## 2021-05-13 ENCOUNTER — Inpatient Hospital Stay (HOSPITAL_COMMUNITY)
Admission: EM | Admit: 2021-05-13 | Discharge: 2021-05-24 | DRG: 308 | Disposition: A | Discharge status: Home / Self Care | Payer: HMO | Attending: Internal Medicine | Admitting: Internal Medicine

## 2021-05-13 ENCOUNTER — Emergency Department (HOSPITAL_COMMUNITY): Payer: HMO

## 2021-05-13 ENCOUNTER — Observation Stay (HOSPITAL_BASED_OUTPATIENT_CLINIC_OR_DEPARTMENT_OTHER): Payer: HMO

## 2021-05-13 ENCOUNTER — Encounter (HOSPITAL_COMMUNITY): Payer: Self-pay

## 2021-05-13 ENCOUNTER — Other Ambulatory Visit: Payer: Self-pay

## 2021-05-13 ENCOUNTER — Observation Stay (HOSPITAL_COMMUNITY): Payer: HMO

## 2021-05-13 DIAGNOSIS — Z91041 Radiographic dye allergy status: Secondary | ICD-10-CM

## 2021-05-13 DIAGNOSIS — Z885 Allergy status to narcotic agent status: Secondary | ICD-10-CM

## 2021-05-13 DIAGNOSIS — Z8673 Personal history of transient ischemic attack (TIA), and cerebral infarction without residual deficits: Secondary | ICD-10-CM

## 2021-05-13 DIAGNOSIS — F17200 Nicotine dependence, unspecified, uncomplicated: Secondary | ICD-10-CM | POA: Diagnosis present

## 2021-05-13 DIAGNOSIS — R9431 Abnormal electrocardiogram [ECG] [EKG]: Secondary | ICD-10-CM | POA: Diagnosis present

## 2021-05-13 DIAGNOSIS — K219 Gastro-esophageal reflux disease without esophagitis: Secondary | ICD-10-CM | POA: Diagnosis present

## 2021-05-13 DIAGNOSIS — E119 Type 2 diabetes mellitus without complications: Secondary | ICD-10-CM

## 2021-05-13 DIAGNOSIS — F17210 Nicotine dependence, cigarettes, uncomplicated: Secondary | ICD-10-CM | POA: Diagnosis present

## 2021-05-13 DIAGNOSIS — R400 Somnolence: Secondary | ICD-10-CM | POA: Diagnosis present

## 2021-05-13 DIAGNOSIS — R1310 Dysphagia, unspecified: Secondary | ICD-10-CM

## 2021-05-13 DIAGNOSIS — R1110 Vomiting, unspecified: Secondary | ICD-10-CM | POA: Diagnosis not present

## 2021-05-13 DIAGNOSIS — R001 Bradycardia, unspecified: Secondary | ICD-10-CM | POA: Diagnosis present

## 2021-05-13 DIAGNOSIS — Z20822 Contact with and (suspected) exposure to covid-19: Secondary | ICD-10-CM | POA: Diagnosis present

## 2021-05-13 DIAGNOSIS — I480 Paroxysmal atrial fibrillation: Secondary | ICD-10-CM | POA: Diagnosis present

## 2021-05-13 DIAGNOSIS — I340 Nonrheumatic mitral (valve) insufficiency: Secondary | ICD-10-CM

## 2021-05-13 DIAGNOSIS — I639 Cerebral infarction, unspecified: Secondary | ICD-10-CM | POA: Diagnosis not present

## 2021-05-13 DIAGNOSIS — K222 Esophageal obstruction: Secondary | ICD-10-CM | POA: Diagnosis present

## 2021-05-13 DIAGNOSIS — I455 Other specified heart block: Secondary | ICD-10-CM

## 2021-05-13 DIAGNOSIS — Z79899 Other long term (current) drug therapy: Secondary | ICD-10-CM

## 2021-05-13 DIAGNOSIS — R55 Syncope and collapse: Secondary | ICD-10-CM | POA: Diagnosis present

## 2021-05-13 DIAGNOSIS — Z6821 Body mass index (BMI) 21.0-21.9, adult: Secondary | ICD-10-CM

## 2021-05-13 DIAGNOSIS — Z820 Family history of epilepsy and other diseases of the nervous system: Secondary | ICD-10-CM

## 2021-05-13 DIAGNOSIS — Z91030 Bee allergy status: Secondary | ICD-10-CM

## 2021-05-13 DIAGNOSIS — H3530 Unspecified macular degeneration: Secondary | ICD-10-CM | POA: Diagnosis present

## 2021-05-13 DIAGNOSIS — R1115 Cyclical vomiting syndrome unrelated to migraine: Secondary | ICD-10-CM | POA: Diagnosis not present

## 2021-05-13 DIAGNOSIS — Z9849 Cataract extraction status, unspecified eye: Secondary | ICD-10-CM

## 2021-05-13 DIAGNOSIS — I441 Atrioventricular block, second degree: Secondary | ICD-10-CM | POA: Diagnosis not present

## 2021-05-13 DIAGNOSIS — M549 Dorsalgia, unspecified: Secondary | ICD-10-CM | POA: Diagnosis present

## 2021-05-13 DIAGNOSIS — E785 Hyperlipidemia, unspecified: Secondary | ICD-10-CM | POA: Diagnosis present

## 2021-05-13 DIAGNOSIS — E291 Testicular hypofunction: Secondary | ICD-10-CM | POA: Diagnosis present

## 2021-05-13 DIAGNOSIS — R930 Abnormal findings on diagnostic imaging of skull and head, not elsewhere classified: Secondary | ICD-10-CM | POA: Diagnosis present

## 2021-05-13 DIAGNOSIS — I10 Essential (primary) hypertension: Secondary | ICD-10-CM | POA: Diagnosis present

## 2021-05-13 DIAGNOSIS — G8929 Other chronic pain: Secondary | ICD-10-CM | POA: Diagnosis present

## 2021-05-13 DIAGNOSIS — N400 Enlarged prostate without lower urinary tract symptoms: Secondary | ICD-10-CM | POA: Diagnosis present

## 2021-05-13 DIAGNOSIS — E43 Unspecified severe protein-calorie malnutrition: Secondary | ICD-10-CM | POA: Diagnosis present

## 2021-05-13 DIAGNOSIS — R4781 Slurred speech: Secondary | ICD-10-CM | POA: Diagnosis present

## 2021-05-13 DIAGNOSIS — R112 Nausea with vomiting, unspecified: Secondary | ICD-10-CM | POA: Diagnosis not present

## 2021-05-13 DIAGNOSIS — I129 Hypertensive chronic kidney disease with stage 1 through stage 4 chronic kidney disease, or unspecified chronic kidney disease: Secondary | ICD-10-CM | POA: Diagnosis present

## 2021-05-13 DIAGNOSIS — Z91048 Other nonmedicinal substance allergy status: Secondary | ICD-10-CM

## 2021-05-13 DIAGNOSIS — F419 Anxiety disorder, unspecified: Secondary | ICD-10-CM | POA: Diagnosis present

## 2021-05-13 DIAGNOSIS — E1122 Type 2 diabetes mellitus with diabetic chronic kidney disease: Secondary | ICD-10-CM | POA: Diagnosis present

## 2021-05-13 DIAGNOSIS — Z833 Family history of diabetes mellitus: Secondary | ICD-10-CM

## 2021-05-13 DIAGNOSIS — F32A Depression, unspecified: Secondary | ICD-10-CM | POA: Diagnosis present

## 2021-05-13 DIAGNOSIS — N1831 Chronic kidney disease, stage 3a: Secondary | ICD-10-CM | POA: Diagnosis present

## 2021-05-13 DIAGNOSIS — Z888 Allergy status to other drugs, medicaments and biological substances status: Secondary | ICD-10-CM

## 2021-05-13 DIAGNOSIS — H01003 Unspecified blepharitis right eye, unspecified eyelid: Secondary | ICD-10-CM | POA: Diagnosis present

## 2021-05-13 DIAGNOSIS — E876 Hypokalemia: Secondary | ICD-10-CM | POA: Diagnosis not present

## 2021-05-13 LAB — ECHOCARDIOGRAM COMPLETE
AR max vel: 1.94 cm2
AV Area VTI: 1.9 cm2
AV Area mean vel: 2.03 cm2
AV Mean grad: 4 mmHg
AV Peak grad: 7.8 mmHg
Ao pk vel: 1.4 m/s
Area-P 1/2: 2.39 cm2
Height: 70.5 in
S' Lateral: 3 cm
Weight: 2480 oz

## 2021-05-13 LAB — COMPREHENSIVE METABOLIC PANEL
ALT: 13 U/L (ref 0–44)
AST: 19 U/L (ref 15–41)
Albumin: 3.6 g/dL (ref 3.5–5.0)
Alkaline Phosphatase: 48 U/L (ref 38–126)
Anion gap: 8 (ref 5–15)
BUN: 15 mg/dL (ref 8–23)
CO2: 25 mmol/L (ref 22–32)
Calcium: 9.4 mg/dL (ref 8.9–10.3)
Chloride: 103 mmol/L (ref 98–111)
Creatinine, Ser: 1.31 mg/dL — ABNORMAL HIGH (ref 0.61–1.24)
GFR, Estimated: 55 mL/min — ABNORMAL LOW (ref >60)
Glucose, Bld: 216 mg/dL — ABNORMAL HIGH (ref 70–99)
Potassium: 3.9 mmol/L (ref 3.5–5.1)
Sodium: 136 mmol/L (ref 135–145)
Total Bilirubin: 0.9 mg/dL (ref 0.3–1.2)
Total Protein: 6.8 g/dL (ref 6.5–8.1)

## 2021-05-13 LAB — I-STAT CHEM 8, ED
BUN: 12 mg/dL (ref 8–23)
Calcium, Ion: 1.17 mmol/L (ref 1.15–1.40)
Chloride: 104 mmol/L (ref 98–111)
Creatinine, Ser: 1.5 mg/dL — ABNORMAL HIGH (ref 0.61–1.24)
Glucose, Bld: 220 mg/dL — ABNORMAL HIGH (ref 70–99)
HCT: 39 % (ref 39.0–52.0)
Hemoglobin: 13.3 g/dL (ref 13.0–17.0)
Potassium: 4 mmol/L (ref 3.5–5.1)
Sodium: 139 mmol/L (ref 135–145)
TCO2: 26 mmol/L (ref 22–32)

## 2021-05-13 LAB — TROPONIN I (HIGH SENSITIVITY)
Troponin I (High Sensitivity): 3 ng/L (ref <18)
Troponin I (High Sensitivity): 4 ng/L (ref <18)

## 2021-05-13 LAB — DIFFERENTIAL
Abs Immature Granulocytes: 0.07 10*3/uL (ref 0.00–0.07)
Basophils Absolute: 0.1 10*3/uL (ref 0.0–0.1)
Basophils Relative: 1 %
Eosinophils Absolute: 0.1 10*3/uL (ref 0.0–0.5)
Eosinophils Relative: 1 %
Immature Granulocytes: 1 %
Lymphocytes Relative: 22 %
Lymphs Abs: 1.9 10*3/uL (ref 0.7–4.0)
Monocytes Absolute: 0.6 10*3/uL (ref 0.1–1.0)
Monocytes Relative: 7 %
Neutro Abs: 6.1 10*3/uL (ref 1.7–7.7)
Neutrophils Relative %: 68 %

## 2021-05-13 LAB — RAPID URINE DRUG SCREEN, HOSP PERFORMED
Amphetamines: NOT DETECTED
Barbiturates: NOT DETECTED
Benzodiazepines: NOT DETECTED
Cocaine: NOT DETECTED
Opiates: NOT DETECTED
Tetrahydrocannabinol: NOT DETECTED

## 2021-05-13 LAB — MAGNESIUM: Magnesium: 2 mg/dL (ref 1.7–2.4)

## 2021-05-13 LAB — CBC
HCT: 39.1 % (ref 39.0–52.0)
Hemoglobin: 12.8 g/dL — ABNORMAL LOW (ref 13.0–17.0)
MCH: 30.4 pg (ref 26.0–34.0)
MCHC: 32.7 g/dL (ref 30.0–36.0)
MCV: 92.9 fL (ref 80.0–100.0)
Platelets: 184 10*3/uL (ref 150–400)
RBC: 4.21 MIL/uL — ABNORMAL LOW (ref 4.22–5.81)
RDW: 13.4 % (ref 11.5–15.5)
WBC: 8.9 10*3/uL (ref 4.0–10.5)
nRBC: 0 % (ref 0.0–0.2)

## 2021-05-13 LAB — URINALYSIS, ROUTINE W REFLEX MICROSCOPIC
Bilirubin Urine: NEGATIVE
Glucose, UA: 50 mg/dL — AB
Hgb urine dipstick: NEGATIVE
Ketones, ur: 20 mg/dL — AB
Leukocytes,Ua: NEGATIVE
Nitrite: NEGATIVE
Protein, ur: NEGATIVE mg/dL
Specific Gravity, Urine: 1.011 (ref 1.005–1.030)
pH: 8 (ref 5.0–8.0)

## 2021-05-13 LAB — PROTIME-INR
INR: 1 (ref 0.8–1.2)
Prothrombin Time: 13.5 seconds (ref 11.4–15.2)

## 2021-05-13 LAB — RESP PANEL BY RT-PCR (FLU A&B, COVID) ARPGX2
Influenza A by PCR: NEGATIVE
Influenza B by PCR: NEGATIVE
SARS Coronavirus 2 by RT PCR: NEGATIVE

## 2021-05-13 LAB — ETHANOL: Alcohol, Ethyl (B): <10 mg/dL (ref <10)

## 2021-05-13 LAB — APTT: aPTT: 27 seconds (ref 24–36)

## 2021-05-13 MED ORDER — ENOXAPARIN SODIUM 40 MG/0.4ML IJ SOSY: 40.0000 mg | PREFILLED_SYRINGE | INTRAMUSCULAR | Status: DC

## 2021-05-13 MED ORDER — ACETAMINOPHEN 325 MG PO TABS
650.0000 mg | ORAL_TABLET | Freq: Four times a day (QID) | ORAL | Status: DC | PRN
  Administered 2021-05-21: 650 mg via ORAL
  Filled 2021-05-13: qty 2

## 2021-05-13 MED ORDER — PANTOPRAZOLE SODIUM 40 MG IV SOLR
40.0000 mg | Freq: Once | INTRAVENOUS | Status: AC
  Administered 2021-05-13: 40 mg via INTRAVENOUS
  Filled 2021-05-13: qty 40

## 2021-05-13 MED ORDER — LORAZEPAM 2 MG/ML IJ SOLN: 0.5000 mg | Freq: Once | INTRAMUSCULAR | Status: DC | PRN

## 2021-05-13 MED ORDER — LACTATED RINGERS IV BOLUS
1000.0000 mL | Freq: Once | INTRAVENOUS | Status: AC
  Administered 2021-05-13: 1000 mL via INTRAVENOUS

## 2021-05-13 MED ORDER — ONDANSETRON HCL 4 MG PO TABS: 4.0000 mg | ORAL_TABLET | Freq: Four times a day (QID) | ORAL | Status: DC | PRN

## 2021-05-13 MED ORDER — ACETAMINOPHEN 650 MG RE SUPP: 650.0000 mg | Freq: Four times a day (QID) | RECTAL | Status: DC | PRN

## 2021-05-13 MED ORDER — LACTATED RINGERS IV SOLN: INTRAVENOUS | Status: DC

## 2021-05-13 MED ORDER — STROKE: EARLY STAGES OF RECOVERY BOOK
Freq: Once | Status: AC
  Filled 2021-05-13: qty 1

## 2021-05-13 MED ORDER — ONDANSETRON HCL 4 MG/2ML IJ SOLN
4.0000 mg | Freq: Once | INTRAMUSCULAR | Status: AC
  Administered 2021-05-13: 4 mg via INTRAVENOUS
  Filled 2021-05-13: qty 2

## 2021-05-13 MED ORDER — PROCHLORPERAZINE EDISYLATE 10 MG/2ML IJ SOLN
5.0000 mg | INTRAMUSCULAR | Status: DC | PRN
  Administered 2021-05-14 – 2021-05-22 (×5): 5 mg via INTRAVENOUS
  Filled 2021-05-13 (×2): qty 2
  Filled 2021-05-13 (×2): qty 1
  Filled 2021-05-13 (×2): qty 2
  Filled 2021-05-13: qty 1

## 2021-05-13 MED ORDER — LORAZEPAM 2 MG/ML IJ SOLN
0.5000 mg | Freq: Once | INTRAMUSCULAR | Status: AC
  Administered 2021-05-13: 0.5 mg via INTRAVENOUS
  Filled 2021-05-13: qty 1

## 2021-05-13 MED ORDER — ONDANSETRON HCL 4 MG/2ML IJ SOLN: 4.0000 mg | Freq: Four times a day (QID) | INTRAMUSCULAR | Status: DC | PRN

## 2021-05-13 MED ORDER — MAGNESIUM SULFATE 2 GM/50ML IV SOLN
2.0000 g | Freq: Once | INTRAVENOUS | Status: AC
  Administered 2021-05-13: 2 g via INTRAVENOUS
  Filled 2021-05-13: qty 50

## 2021-05-13 MED ORDER — SODIUM CHLORIDE 0.9 % IV BOLUS (SEPSIS)
1000.0000 mL | Freq: Once | INTRAVENOUS | Status: AC
  Administered 2021-05-13: 1000 mL via INTRAVENOUS

## 2021-05-13 MED ORDER — PROCHLORPERAZINE EDISYLATE 10 MG/2ML IJ SOLN
5.0000 mg | Freq: Once | INTRAMUSCULAR | Status: AC
  Administered 2021-05-13: 5 mg via INTRAVENOUS
  Filled 2021-05-13: qty 2

## 2021-05-13 NOTE — ED Notes (Signed)
Assisted pt with urinal, repositioned back in bed

## 2021-05-13 NOTE — H&P (Signed)
History and Physical    Scott Willis GQQ:[id number] DOB: [birth year] DOA: 05/13/2021  PCP: Eunice Blase, MD  Patient coming from: Home.  I have personally briefly reviewed patient's old medical records in [place]  Chief Complaint: Nausea and vomiting.  HPI: Scott Willis is a 81 y.o. male with medical history significant of osteoarthritis, hypertension, right eye blepharitis, blindness, BPH, type II DM, hyperlipidemia, nephrolithiasis, hypogonadism, macular degeneration, osteoporosis in the setting of androgen replacement therapy, gastric ulcer, near syncopal episode who is coming to the emergency department with complaints of persistent nausea and multiple episodes of emesis since yesterday at 1900.  He denied diarrhea, constipation, melena or hematochezia.  No fever, night sweats, but positive chills and decreased appetite since yesterday.  No flank pain, dysuria, frequency or hematuria.  While in the ER, the patient had some sinus pauses.  He has had some positional lightheadedness, but no chest pain, palpitations, diaphoresis, PND, orthopnea or recent pitting edema of the lower extremities.  No URI symptoms like rhinorrhea or sore throat.  No dyspnea, wheezing or hemoptysis.  Denied polyuria, polydipsia, polyphagia or blurred vision.  ED Course: Initial vital signs were temperature 97.6 F, pulse 84, respiration 18, BP 134/70 mmHg O2 sat 98% on room air.  The patient received ondansetron 4 mg IVP twice, sodium chloride 1000 mL bolus, LR 1000 mL bolus and lorazepam 0.5 mg IVP x1.  I added pantoprazole 40 mg IVP, Compazine 5 mg IVP and 2 g of magnesium sulfate IVPB due to borderline prolonged QT interval.  Cardiology was consulted by the ED provider and requested transfer to [facility] for the patient can be evaluated by electrophysiology.  Lab work: Urinalysis showed glucosuria 50 and ketonuria 20 mg/dL, otherwise unremarkable.  UDS was negative.  Coronavirus influenza PCR negative.   Troponin x2 normal.  CBC showed a white count of 8.9, hemoglobin 12.8 g/dL platelets 184.  Unremarkable PT, INR and PTT.  Alcohol level was normal.  CMP showed a glucose of 216 and creatinine of 1.31 mg/dL.  The rest of the CMP was normal.  Imaging: One-view portable chest radiograph showed no acute cardiopulmonary pathology.  CT head showed a hypodensity in the left aspect of the pons suspicious for infarct that is new since 2017, but otherwise age-indeterminate.  MRI of the brain recommended.  Please see images and full radiology report for further details.  Review of Systems: As per HPI otherwise all other systems reviewed and are negative.  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    Ulcer    gastric    Past Surgical History:  Procedure Laterality Date   BACK SURGERY  1998   Dr.Nitka   BACK SURGERY  03/2006   Removed Rods Dr.Nitka    CARPAL TUNNEL RELEASE  08/2006   CARPAL TUNNEL RELEASE  08/2006   [place]    CATARACT EXTRACTION  02/21/2007   Dr.Epps    EYE SURGERY  2013   Right eye every 8 weeks    KIDNEY STONE SURGERY  08/11/2007  Dr.Tannenbaum    LITHOTRIPSY  08/11/2007   Dr.Nesi   REFRACTIVE SURGERY  09/2005   Macular Degeneration Dr.Mathews    SHOULDER ARTHROSCOPY WITH SUBACROMIAL DECOMPRESSION, ROTATOR CUFF REPAIR AND BICEP TENDON REPAIR Left 08/22/2013   Procedure:  LEFT SHOULDER DIAGNOSTIC OPERATIVE ARTHROSCOPY ,EXTENSIVE DEBRIDEMENT, BICEPS RELEASE AND TENODESIS, ROTATOR CUFF REPAIR, SUBACROMIAL DECOMPRESSION;  Surgeon: Meredith Pel, MD;  Location: [place];  Service: Orthopedics;  Laterality: Left;   LEFT SHOULDER DOA,EXTENSIVE DEBRIDEMENT, BICEPS RELEASE AND TENODESIS, ROTATOR CUFF REPAIR, SUBACROMIAL DECOMPRESSION,    Social History  reports that he has been smoking cigarettes. He has a 88.50 pack-year smoking history. He has never used smokeless tobacco. He reports that he does not drink alcohol and does not use drugs.  Allergies  Allergen Reactions   Bee Pollen Anaphylaxis   Tape Other (See Comments)    Any medical tape, Rash  Any medical tape, Rash  Any medical tape, Rash    Fluorescein-Benoxinate Other (See Comments)    Rash  Rash    Metformin Other (See Comments)    Severe Diarrhea  Severe Diarrhea    Metformin And Related     Severe Diarrhea    Morphine And Related Other (See Comments)    Kidney failure  Kidney failure  Kidney failure    Statins Other (See Comments)    Loss of appetite Loss of appetite Loss of appetite    Family History  Problem Relation Age of Onset   Alzheimer's disease Mother    Colon cancer Father    Diabetes Sister    Prior to Admission medications   Medication Sig Start Date End Date Taking? Authorizing Provider  blood glucose meter kit and supplies KIT Check CBG QID 04/20/18   Hilts, Michael, MD  Cholecalciferol (VITAMIN D-3) 125 MCG (5000 UT) TABS Take 1 tablet by mouth daily.    [provider]  DIAZEPAM PO Take 1 tablet by mouth once. Reports takes a couple hours prior to eye injection    [provider]  diclofenac sodium (VOLTAREN) 1 % GEL Apply 4 g topically 4 (four) times daily as needed. 09/15/18   Hilts, Legrand Como, MD  finasteride (PROSCAR) 5 MG tablet TAKE 1 TABLET BY MOUTH EVERY DAY 09/21/19   Hilts, Legrand Como, MD  JANUVIA 100 MG tablet TAKE 1 TABLET BY MOUTH EVERY DAY 08/21/20   Hilts, Legrand Como, MD  Magnesium Oxide 400 (240 Mg) MG TABS TAKE 1 TABLET BY MOUTH EVERY DAY 02/04/20   Hilts, Legrand Como, MD  moxifloxacin (VIGAMOX) 0.5 % ophthalmic solution Place 1 drop into the right eye 3 (three) times daily. Every 16 weeks  for 4 days after surgery    [provider]  Multiple Vitamins-Minerals (PRESERVISION AREDS 2 PO) Take by mouth daily.    [provider]  mupirocin ointment (BACTROBAN) 2 % Apply 1 application topically 2 (two) times daily. 11/04/20   Lavonna Monarch, MD  oxyCODONE-acetaminophen (PERCOCET/ROXICET) 5-325 MG tablet Take 1-2 tablets by mouth every 6 (six) hours as needed for severe pain. 05/24/19   Hilts, Legrand Como, MD  Polyethyl Glycol-Propyl Glycol (SYSTANE OP) Apply 1 drop to eye as needed (dry eyes).     [provider]  polyethylene glycol (GOLYTELY) 236 g solution Use once daily until bowels are moving normally. 08/20/20   Hilts, Legrand Como, MD  Prodigy Twist Top Lancets 28G MISC  02/14/19   [provider]  tamsulosin (FLOMAX) 0.4 MG CAPS capsule TAKE 1 CAPSULE BY MOUTH EVERY DAY 09/21/19   Hilts, Legrand Como,  MD  trimethoprim-polymyxin b (POLYTRIM) ophthalmic solution INSTILL 1 DROP INTO RIGHT EYE 4 TIMES DAILY FOR 2 DAYS AFTER EACH MONTHLY EYE INJECTION 10/28/18   [provider]  venlafaxine (EFFEXOR) 75 MG tablet Take 1 tablet (75 mg total) by mouth 2 (two) times daily. 06/20/19   Hilts, Legrand Como, MD    Physical Exam: Vitals:   05/13/21 0401 05/13/21 0500 05/13/21 0517  BP: 134/70 (!) 170/94   Pulse: 84 71   Resp: 18 (!) 23   Temp: 97.6 F (36.4 C)    TempSrc: Oral    SpO2: 98% 96%   Weight:   70.3 kg  Height:   5' 10.5" (1.791 m)    Constitutional: Frail, chronically ill-appearing elderly male.  NAD, calm, comfortable Eyes: PERRL, lids and conjunctivae normal ENMT: Mucous membranes and lips are mildly dry.  Posterior pharynx clear of any exudate or lesions. Neck: normal, supple, no masses, no thyromegaly Respiratory: clear to auscultation bilaterally, no wheezing, no crackles. Normal respiratory effort. No accessory muscle use.  Cardiovascular: Sinus bradycardia in the 40s and 50s, no murmurs / rubs / gallops. No extremity edema. 2+ pedal pulses. No  carotid bruits.  Abdomen: No distention.  Soft, no tenderness, no masses palpated. No hepatosplenomegaly. Bowel sounds positive.  Musculoskeletal: no clubbing / cyanosis.  Mild generalized weakness.  Good ROM, no contractures. Normal muscle tone.  Skin: no rashes, lesions, ulcers on very limited dermatological examination. Neurologic: Speech is mildly slurred, CN 2-12 grossly intact. Sensation intact, DTR normal. Strength 5/5 in all 4.  Gait examination was deferred. Psychiatric: Normal judgment and insight. Alert and oriented x 3. Normal mood.   Labs on Admission: I have personally reviewed following labs and imaging studies  CBC: Recent Labs  Lab 05/13/21 0414 05/13/21 0432  WBC 8.9  --   NEUTROABS 6.1  --   HGB 12.8* 13.3  HCT 39.1 39.0  MCV 92.9  --   PLT 184  --     Basic Metabolic Panel: Recent Labs  Lab 05/13/21 0414 05/13/21 0432 05/13/21 0627  NA 136 139  --   K 3.9 4.0  --   CL 103 104  --   CO2 25  --   --   GLUCOSE 216* 220*  --   BUN 15 12  --   CREATININE 1.31* 1.50*  --   CALCIUM 9.4  --   --   MG  --   --  2.0    GFR: Estimated Creatinine Clearance: 39.1 mL/min (A) (by C-G formula based on SCr of 1.5 mg/dL (H)).  Liver Function Tests: Recent Labs  Lab 05/13/21 0414  AST 19  ALT 13  ALKPHOS 48  BILITOT 0.9  PROT 6.8  ALBUMIN 3.6    Urine analysis:    Component Value Date/Time   COLORURINE STRAW (A) 05/13/2021 0800   APPEARANCEUR CLEAR 05/13/2021 0800   LABSPEC 1.011 05/13/2021 0800   PHURINE 8.0 05/13/2021 0800   GLUCOSEU 50 (A) 05/13/2021 0800   HGBUR NEGATIVE 05/13/2021 0800   BILIRUBINUR NEGATIVE 05/13/2021 0800   KETONESUR 20 (A) 05/13/2021 0800   PROTEINUR NEGATIVE 05/13/2021 0800   UROBILINOGEN 1.0 09/10/2008 1356   NITRITE NEGATIVE 05/13/2021 0800   LEUKOCYTESUR NEGATIVE 05/13/2021 0800    Radiological Exams on Admission: CT HEAD WO CONTRAST  Result Date: 05/13/2021 CLINICAL DATA:  Dizziness, non-specific EXAM: CT HEAD  WITHOUT CONTRAST TECHNIQUE: Contiguous axial images were obtained from the base of the skull through the vertex without intravenous contrast.  RADIATION DOSE REDUCTION: This exam was performed according to the departmental dose-optimization program which includes automated exposure control, adjustment of the mA and/or kV according to patient size and/or use of iterative reconstruction technique. COMPARISON:  CT head 04/23/2015. FINDINGS: Brain: New hypodensity in the left aspect of pons (for example: Series 5, image 26; series 2 image 9; series 4, images 37/38.). No evidence of acute hemorrhage, hydrocephalus, mass lesion, midline shift or extra-axial hemorrhage. Cerebral atrophy. Patchy white matter hypoattenuation, nonspecific but compatible with chronic microvascular disease. Vascular: No hyperdense vessel identified. Calcific intracranial atherosclerosis. Skull: No acute fracture. Sinuses/Orbits: Clear sinuses.  Unremarkable orbits. Other: No mastoid effusions. IMPRESSION: Hypodensity in the left aspect of the pons, suspicious for infarct that is new since 2017 but otherwise age indeterminate by CT. MRI could further evaluate for acute infarct. Electronically Signed   By: Margaretha Sheffield M.D.   On: 05/13/2021 05:19   DG CHEST PORT 1 VIEW  Result Date: 05/13/2021 CLINICAL DATA:  81 year old male with vomiting. EXAM: PORTABLE CHEST 1 VIEW COMPARISON:  Chest radiographs 04/23/2015 and earlier. FINDINGS: Portable AP semi upright view at 0433 hours. Stable lung volumes. Mediastinal contours remain normal. Visualized tracheal air column is within normal limits. No pneumothorax or pulmonary edema. Lung base ventilation appears stable since 2017, no consolidation or confluent opacity. No acute osseous abnormality identified. Paucity of bowel gas in the upper abdomen. IMPRESSION: No acute cardiopulmonary abnormality. Electronically Signed   By: Genevie Ann M.D.   On: 05/13/2021 04:44    EKG: Independently reviewed.   Vent. rate 87 BPM PR interval 268 ms QRS duration 86 ms QT/QTcB 412/496 ms P-R-T axes 0 67 80 ?av block Abnormal ECG Borderline prolonged QT interval  Assessment/Plan Principal Problem:   Sinus pause   Vasovagal due to N/V? Observation/PCU. External pacemaker pads on. Avoid negative chronotropic medications. Keep electrolytes optimized. Check echocardiogram. Cardiology evaluation greatly appreciated. Will be transferred to [facility] for evaluation by electrophysiology.  Active Problems:    Abnormal head CT Mildly slurred speech. Positive dizziness, nausea and emesis. Age-indeterminate CVA suspected. Check carotid Doppler and echocardiogram. Check fasting lipids and hemoglobin A1c. Neurology evaluation once at to [facility].    Intractable nausea and vomiting No response to Zofran. Responded to Compazine. Protonix 40 mg IVP x1. Continue IV fluids.    Borderline prolonged QT interval Magnesium sulfate 2 g IVPB ordered. Avoid QT prolonging medications.    Benign essential hypertension Allow permissive hypertension pending MRI of the brain.    BPH (benign prostatic hyperplasia) Continue tamsulosin.    Type 2 diabetes mellitus (HCC) Currently NPO. Check CBG every 6 hours. Check hemoglobin A1c.    Tobacco use disorder Tobacco cessation. Nicotine replacement therapy as needed.    Depression with anxiety. Resume venlafaxine and diazepam as needed once cleared for oral intake.    Gastro-esophageal reflux disease without esophagitis Pantoprazole 40 mg IVP.    Stage 3a chronic kidney disease (CKD) (HCC) Monitor renal function electrolytes.     DVT prophylaxis: SCDs. Code Status:   Full code. Family Communication:   Disposition Plan:   Patient is from:  Home.  Anticipated DC to:  Home.  Anticipated DC date:  05/15/2021.  Anticipated DC barriers: Clinical status.  Consults called:  Cardiology and neurology to see on [facility]. Admission status:  Observation/PCU.  Severity of Illness: High severity in the setting of sinus pause with intractable nausea and vomiting with suspicious left pontine hypodensity on brain imaging.  Shanon Brow  Judieth Keens MD [facility]  How to contact the [facility] Attending or Consulting provider [place] or covering provider during after hours [place], for this patient?   Check the care team in [facility] and look for a) attending/consulting TR[facility] provider listed and b) the [facility] team listed Log into [email or web address] and use [place]'s universal password to access. If you do not have the password, please contact the hospital operator. Locate the [facility] provider you are looking for under [facility] and page to a number that you can be directly reached. If you still have difficulty reaching the provider, please page the [facility] (Director on Call) for the Hospitalists listed on amion for assistance.  05/13/2021, 8:48 AM   This document was prepared using Dragon voice recognition software and may contain some unintended transcription errors.

## 2021-05-13 NOTE — ED Triage Notes (Signed)
Pt BIB EMS from home. Pt states that he has been vomiting since 7 pm. Pt complains of SHOB upon exertion.

## 2021-05-13 NOTE — Evaluation (Signed)
Speech Language Pathology Evaluation Patient Details Name: Scott Willis MRN: [id number] DOB: February 25, 1941 Today's Date: 05/13/2021 Time: 46-1750 SLP Time Calculation (min) (ACUTE ONLY): 15 min  Problem List:  Patient Active Problem List   Diagnosis Date Noted   Sinus pause 05/13/2021   Stage 3a chronic kidney disease (CKD) ([place]) 05/13/2021   Intractable nausea and vomiting 05/13/2021   Borderline prolonged QT interval 05/13/2021   Abnormal head CT 05/13/2021   Gastro-esophageal reflux disease without esophagitis 07/05/2020   Personal history of colonic polyps 07/05/2020   Type 2 diabetes mellitus without complications ([place]) 98/92/1194   Porokeratosis 04/19/2020   Vitamin D deficiency 05/24/2019   Diabetic neuropathy ([place]) 12/26/2018   Paroxysmal atrial fibrillation ([place]) 09/30/2018   Statin intolerance 09/30/2018   Closed fracture of left side of maxilla with routine healing 04/21/2017   Near syncope 04/23/2015   Depression 09/11/2013   Hereditary and idiopathic peripheral neuropathy 08/29/2013   Dysphagia, pharyngoesophageal phase 08/29/2013   Rotator cuff tear 08/22/2013   Left shoulder pain 07/31/2013   Tobacco use disorder 04/19/2013   [place] 04/11/2013   Gastric ulcer 04/11/2013   Melena 04/11/2013   Macular degeneration    Benign essential hypertension    Hyperlipidemia with target low density lipoprotein (LDL) cholesterol less than 100 mg/dL    Osteoporosis due to androgen therapy    Low serum testosterone level    BPH (benign prostatic hyperplasia)    Type 2 diabetes mellitus ([place])    Past Medical History:  Past Medical History:  Diagnosis Date   Arthritis    Benign essential hypertension    Blepharitis, squamous    Right Eye    Blindness    BPH (benign prostatic hyperplasia)    DM (diabetes mellitus) type II uncontrolled with eye manifestation    fasting 110s   Hyperlipidemia LDL goal < 100    Kidney stones    Low serum testosterone level     Macular degeneration    Osteoporosis due to androgen therapy    Ulcer    gastric   Past Surgical History:  Past Surgical History:  Procedure Laterality Date   [place] SURGERY  03/2006   Removed Rods Dr.Nitka    CARPAL TUNNEL RELEASE  08/2006   CARPAL TUNNEL RELEASE  08/2006   [place]    CATARACT EXTRACTION  02/21/2007   Dr.Epps    EYE SURGERY  2013   Right eye every 8 weeks    KIDNEY STONE SURGERY  08/11/2007   Dr.Tannenbaum    LITHOTRIPSY  08/11/2007   Dr.Nesi   REFRACTIVE SURGERY  09/2005   Macular Degeneration Dr.Mathews    SHOULDER ARTHROSCOPY WITH SUBACROMIAL DECOMPRESSION, ROTATOR CUFF REPAIR AND BICEP TENDON REPAIR Left 08/22/2013   Procedure:  LEFT SHOULDER DIAGNOSTIC OPERATIVE ARTHROSCOPY ,EXTENSIVE DEBRIDEMENT, BICEPS RELEASE AND TENODESIS, ROTATOR CUFF REPAIR, SUBACROMIAL DECOMPRESSION;  Surgeon: Meredith Pel, MD;  Location: [place];  Service: Orthopedics;  Laterality: Left;  LEFT SHOULDER DOA,EXTENSIVE DEBRIDEMENT, BICEPS RELEASE AND TENODESIS, ROTATOR CUFF REPAIR, SUBACROMIAL DECOMPRESSION,   HPI:  Patient is an 81 y.o. male with PMH: osteoarthritis, blindness, BPH, DM-2, HLD, macular degeneration, osteoporosis, gastric ulcer, near syncopal episode who arrived at ER with persistent nausea and multiple episodes of emesis since yesterday. Patient denied fever but endorsed chills and decreased appetite. In ER he had some sinus pauses, positional lightheadness. He was afebrileand oxygen saturations at 95% on RA. MRI revealed: no acute intracranial pathology.  small remote  infarct in the left pons corresponding to the finding on the CT. Patient awaiting transfer to [facility] for evaluation by electrophysiology as per cardiology request.   Assessment / Plan / Recommendation Clinical Impression  Patient exhibits a mild dysarthria impacting his speech intelligibility at phrase, sentence and conversational levels. Overall oral motor exam was fairly  unremarkable. He was fully oriented, able to recall and describe recent medical interventions, appears aware of reasoning for hospitalization. Overall cognitive function appears WFL. Patient himself reported that initially his speech was more slurred than it is now and although not 100%, he feels it is only mildly impaired. Patient is currently NPO and has been having nausea and vomitting, telling SLP that he tried to drink some water today and it caused him to vomit. SLP is recommending follow up next date to determine if dysarthria of speech has resolved and consult with MD and determine if swallow evaluation is warranted.    SLP Assessment  SLP Recommendation/Assessment: Patient needs continued Speech [place] [facility] SLP Visit Diagnosis: Dysarthria and anarthria (R47.1)    Recommendations for follow up therapy are one component of a multi-disciplinary discharge planning process, led by the attending physician.  Recommendations may be updated based on patient status, additional functional criteria and insurance authorization.    Follow Up Recommendations  No SLP follow up    Assistance Recommended at Discharge  None  Functional Status Assessment Patient has had a recent decline in their functional status and demonstrates the ability to make significant improvements in function in a reasonable and predictable amount of time.  Frequency and Duration min 1 x/week  1 week      SLP Evaluation Cognition  Overall Cognitive Status: Within Functional Limits for tasks assessed Arousal/Alertness: Awake/alert Orientation Level: Oriented X4 Year: 2023 Month: January Day of Week: Correct Memory: Appears intact Awareness: Appears intact Problem Solving: Appears intact Safety/Judgment: Appears intact       Comprehension  Auditory Comprehension Overall Auditory Comprehension: Appears within functional limits for tasks assessed    Expression Expression Primary Mode of Expression:  Verbal Verbal Expression Overall Verbal Expression: Appears within functional limits for tasks assessed Initiation: No impairment   Oral / Motor  Motor Speech Overall Motor Speech: Impaired Respiration: Within functional limits Resonance: Within functional limits Articulation: Impaired Level of Impairment: Conversation Intelligibility: Intelligibility reduced Word: 75-100% accurate Phrase: 75-100% accurate Sentence: 75-100% accurate Conversation: 50-74% accurate Motor Planning: Witnin functional limits Motor Speech Errors: Not applicable Effective Techniques: Slow rate;Increased vocal intensity            Sonia Baller, MA, CCC-SLP Speech Therapy

## 2021-05-13 NOTE — ED Provider Notes (Signed)
[facility] DEPT Provider Note   CSN: [id number] Arrival date & time: 05/13/21  0348     History  Chief Complaint  Patient presents with   Emesis   Level 5 caveat due to acuity of condition DAYMEN HASSEBROCK is a 81 y.o. male.  The history is provided by the patient.  Emesis Severity:  Severe Timing:  Intermittent Progression:  Worsening Chronicity:  New Relieved by:  Nothing Worsened by:  Nothing Associated symptoms: no abdominal pain and no diarrhea   Patient history of diabetes presents with vomiting.  Patient reports approximately 9 hours ago he had dizziness and vomiting.  He denies any pain complaints. Denies any abdominal pain.  No significant headache is reported.   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    Ulcer    gastric    Home Medications Prior to Admission medications   Medication Sig Start Date End Date Taking? Authorizing Provider  blood glucose meter kit and supplies KIT Check CBG QID 04/20/18   Hilts, Michael, MD  Cholecalciferol (VITAMIN D-3) 125 MCG (5000 UT) TABS Take 1 tablet by mouth daily.    [provider]  DIAZEPAM PO Take 1 tablet by mouth once. Reports takes a couple hours prior to eye injection    [provider]  diclofenac sodium (VOLTAREN) 1 % GEL Apply 4 g topically 4 (four) times daily as needed. 09/15/18   Hilts, Legrand Como, MD  finasteride (PROSCAR) 5 MG tablet TAKE 1 TABLET BY MOUTH EVERY DAY 09/21/19   Hilts, Legrand Como, MD  JANUVIA 100 MG tablet TAKE 1 TABLET BY MOUTH EVERY DAY 08/21/20   Hilts, Legrand Como, MD  Magnesium Oxide 400  (240 Mg) MG TABS TAKE 1 TABLET BY MOUTH EVERY DAY 02/04/20   Hilts, Legrand Como, MD  moxifloxacin (VIGAMOX) 0.5 % ophthalmic solution Place 1 drop into the right eye 3 (three) times daily. Every 16 weeks for 4 days after surgery    [provider]  Multiple Vitamins-Minerals (PRESERVISION AREDS 2 PO) Take by mouth daily.    [provider]  mupirocin ointment (BACTROBAN) 2 % Apply 1 application topically 2 (two) times daily. 11/04/20   Lavonna Monarch, MD  oxyCODONE-acetaminophen (PERCOCET/ROXICET) 5-325 MG tablet Take 1-2 tablets by mouth every 6 (six) hours as needed for severe pain. 05/24/19   Hilts, Legrand Como, MD  Polyethyl Glycol-Propyl Glycol (SYSTANE OP) Apply 1 drop to eye as needed (dry eyes).     [provider]  polyethylene glycol (GOLYTELY) 236 g solution Use once daily until bowels are moving normally. 08/20/20   Hilts, Legrand Como, MD  Prodigy Twist Top Lancets 28G MISC  02/14/19   [provider]  tamsulosin (FLOMAX) 0.4 MG CAPS capsule TAKE 1 CAPSULE BY MOUTH EVERY DAY 09/21/19   Hilts, Legrand Como, MD  trimethoprim-polymyxin b (POLYTRIM) ophthalmic solution INSTILL 1 DROP INTO RIGHT EYE 4 TIMES DAILY FOR 2 DAYS AFTER EACH MONTHLY EYE INJECTION 10/28/18   [provider]  venlafaxine (EFFEXOR) 75 MG tablet Take 1 tablet (75 mg total) by mouth  2 (two) times daily. 06/20/19   Hilts, Legrand Como, MD      Allergies    Bee pollen, Tape, Fluorescein-benoxinate, Metformin, Metformin and related, Morphine and related, and Statins    Review of Systems   Review of Systems  Unable to perform ROS: Acuity of condition  Cardiovascular:  Negative for chest pain.  Gastrointestinal:  Positive for vomiting. Negative for abdominal pain and diarrhea.   Physical Exam Updated Vital Signs BP 134/70 (BP Location: Left Arm)    Pulse 84    Temp 97.6 F (36.4 C) (Oral)    Resp 18    SpO2 98%  Physical Exam CONSTITUTIONAL: Elderly, ill-appearing HEAD:  Normocephalic/atraumatic EYES: No nystagmus.  Patient blind ENMT: Mucous membranes moist NECK: supple no meningeal signs SPINE/BACK:entire spine nontender CV: S1/S2 noted, no murmurs/rubs/gallops noted LUNGS: Lungs are clear to auscultation bilaterally, no apparent distress ABDOMEN: soft, nontender, no rebound or guarding, bowel sounds noted throughout abdomen GU:no cva tenderness NEURO: Pt is awake/alert/appropriate, moves all extremitiesx4.  No facial droop.  No arm or leg drift EXTREMITIES: pulses normal/equal, full ROM SKIN: warm, color normal PSYCH: no abnormalities of mood noted, alert and oriented to situation  ED Results / Procedures / Treatments   Labs (all labs ordered are listed, but only abnormal results are displayed) Labs Reviewed  CBC - Abnormal; Notable for the following components:      Result Value   RBC 4.21 (*)    Hemoglobin 12.8 (*)    All other components within normal limits  COMPREHENSIVE METABOLIC PANEL - Abnormal; Notable for the following components:   Glucose, Bld 216 (*)    Creatinine, Ser 1.31 (*)    GFR, Estimated 55 (*)    All other components within normal limits  I-STAT CHEM 8, ED - Abnormal; Notable for the following components:   Creatinine, Ser 1.50 (*)    Glucose, Bld 220 (*)    All other components within normal limits  RESP PANEL BY RT-PCR (FLU A&B, COVID) ARPGX2  ETHANOL  DIFFERENTIAL  PROTIME-INR  APTT  RAPID URINE DRUG SCREEN, HOSP PERFORMED  URINALYSIS, ROUTINE W REFLEX MICROSCOPIC  TROPONIN I (HIGH SENSITIVITY)  TROPONIN I (HIGH SENSITIVITY)    EKG EKG Interpretation  Date/Time:  Tuesday May 13 2021 05:21:35 EST Ventricular Rate:  87 PR Interval:  268 QRS Duration: 86 QT Interval:  412 QTC Calculation: 496 R Axis:   67 Text Interpretation: ?av block Abnormal ECG Borderline prolonged QT interval Confirmed by Ripley Fraise [id number]) on 05/13/2021 5:54:21 AM  Radiology CT HEAD WO CONTRAST  Result Date:  05/13/2021 CLINICAL DATA:  Dizziness, non-specific EXAM: CT HEAD WITHOUT CONTRAST TECHNIQUE: Contiguous axial images were obtained from the base of the skull through the vertex without intravenous contrast. RADIATION DOSE REDUCTION: This exam was performed according to the departmental dose-optimization program which includes automated exposure control, adjustment of the mA and/or kV according to patient size and/or use of iterative reconstruction technique. COMPARISON:  CT head 04/23/2015. FINDINGS: Brain: New hypodensity in the left aspect of pons (for example: Series 5, image 26; series 2 image 9; series 4, images 37/38.). No evidence of acute hemorrhage, hydrocephalus, mass lesion, midline shift or extra-axial hemorrhage. Cerebral atrophy. Patchy white matter hypoattenuation, nonspecific but compatible with chronic microvascular disease. Vascular: No hyperdense vessel identified. Calcific intracranial atherosclerosis. Skull: No acute fracture. Sinuses/Orbits: Clear sinuses.  Unremarkable orbits. Other: No mastoid effusions. IMPRESSION: Hypodensity in the left aspect of the pons, suspicious for infarct that is new since 2017 but  otherwise age indeterminate by CT. MRI could further evaluate for acute infarct. Electronically Signed   By: Margaretha Sheffield M.D.   On: 05/13/2021 05:19   DG CHEST PORT 1 VIEW  Result Date: 05/13/2021 CLINICAL DATA:  81 year old male with vomiting. EXAM: PORTABLE CHEST 1 VIEW COMPARISON:  Chest radiographs 04/23/2015 and earlier. FINDINGS: Portable AP semi upright view at 0433 hours. Stable lung volumes. Mediastinal contours remain normal. Visualized tracheal air column is within normal limits. No pneumothorax or pulmonary edema. Lung base ventilation appears stable since 2017, no consolidation or confluent opacity. No acute osseous abnormality identified. Paucity of bowel gas in the upper abdomen. IMPRESSION: No acute cardiopulmonary abnormality. Electronically Signed   By: Genevie Ann  M.D.   On: 05/13/2021 04:44    Procedures .Critical Care Performed by: Ripley Fraise, MD Authorized by: Ripley Fraise, MD   Critical care provider statement:    Critical care time (minutes):  54   Critical care start time:  05/13/2021 5:00 AM   Critical care end time:  05/13/2021 5:54 AM   Critical care time was exclusive of:  Separately billable procedures and treating other patients   Critical care was necessary to treat or prevent imminent or life-threatening deterioration of the following conditions:  Cardiac failure, circulatory failure, dehydration and CNS failure or compromise   Critical care was time spent personally by me on the following activities:  Discussions with consultants, development of treatment plan with patient or surrogate, re-evaluation of patient's condition, pulse oximetry, ordering and review of radiographic studies, ordering and review of laboratory studies, ordering and performing treatments and interventions, examination of patient and evaluation of patient's response to treatment   I assumed direction of critical care for this patient from another provider in my specialty: no     Care discussed with: admitting provider      Medications Ordered in ED Medications  ondansetron (ZOFRAN) injection 4 mg (4 mg Intravenous Given 05/13/21 0424)  sodium chloride 0.9 % bolus 1,000 mL (0 mLs Intravenous Stopped 05/13/21 0517)  ondansetron (ZOFRAN) injection 4 mg (4 mg Intravenous Given 05/13/21 0517)  lactated ringers bolus 1,000 mL (1,000 mLs Intravenous New Bag/Given 05/13/21 0517)    ED Course/ Medical Decision Making/ A&P Clinical Course as of 05/13/21 0606  Tue May 13, 2021  0420 Patient presents with vomiting.  He is very difficult to examine and obtain history from due to actively vomiting.  He has no focal abdominal tenderness and denies any pain.  He reports he feels dizzy.  He has no gross neurodeficits noted.  However due to dizziness and vomiting we will  proceed with CT head [DW]  0510 Patient reports continued nausea but overall appears improved.  He denies any abdominal pain.  There is no focal abdominal tenderness.  Denies headache, reports dizziness is improved.  Labs reveal mild dehydration but overall unremarkable [DW]  0545 Patient found to have significant sinus pauses and has become symptomatic during this time.  Review of telemetry monitoring reveals pauses.  Blood pressures remained appropriate. [DW]  7017 Discussed telemetry monitoring findings with Dr. Conley Canal  with cardiology.  He is reviewed these findings.  He recommends medical admission, electrophysiology evaluation.  No acute intervention at this time unless the episodes become more frequent [DW]  0557 Patient with ?  Subacute infarct CT head.  However patient is clearly symptomatic when his heart rate drops transiently.  I suspect this is causing more of his symptoms at this time.  He will need  MRI after he has been stabilized from  a cardiac standpoint [DW]  0558 Glucose(!): 220 Hyperglycemia noted [DW]  6803 Discussed with Dr. Myna Hidalgo for admission.  Patient to be admitted for sinus pauses and EP evaluation [DW]  0606 Patient has already received 2 doses of Zofran.  Recommend avoiding further Zofran dosing.  Patient's nausea is very transient, will defer any meds at this time [DW]    Clinical Course User Index [DW] Ripley Fraise, MD                           Medical Decision Making Amount and/or Complexity of Data Reviewed Labs: ordered. Decision-making details documented in ED Course. Radiology: ordered.  Risk Prescription drug management. Decision regarding hospitalization.   This patient presents to the ED for concern of vomiting, this involves an extensive number of treatment options, and is a complaint that carries with it a high risk of complications and morbidity.  The differential diagnosis includes bowel obstruction, vertigo, stroke, intracranial  hemorrhage  Comorbidities that complicate the patient evaluation: Patients presentation is complicated by their history of diabetes  Social determinants history: Patient is legally blind which increases the complexity of his illness  Additional history obtained: Records reviewed previous consult notes reviewed  Lab Tests: I Ordered, and personally interpreted labs.  The pertinent results include: Renal insufficiency  Imaging Studies ordered: I ordered imaging studies including CT scan head I independently visualized and interpreted imaging which showed no acute findings, possible subacute stroke I agree with the radiologist interpretation  Cardiac Monitoring: The patient was maintained on a cardiac monitor.  I personally viewed and interpreted the cardiac monitor which showed an underlying rhythm of: Initially sinus rhythm, now having sinus pauses  Medicines ordered and prescription drug management: I ordered medication including Zofran for nausea Reevaluation of the patient after these medicines showed that the patient    improved  Critical Interventions:       Cardiology consultation, placed patient on monitor  Consultations Obtained: I requested consultation with the consultant cardiologist Dr. Conley Canal, and discussed  findings as well as pertinent plan - they recommend: Admission and electrophysiology evaluation  Reevaluation: After the interventions noted above, I reevaluated the patient and found that they have :stayed the same  Complexity of problems addressed: Patients presentation is most consistent with  acute presentation with potential threat to life or bodily function      Disposition: After consideration of the diagnostic results and the patients response to treatment,  I feel that the patent would benefit from admission.              Final Clinical Impression(s) / ED Diagnoses Final diagnoses:  Vomiting  Sinus pause    Rx / DC Orders ED  Discharge Orders     None         Ripley Fraise, MD 05/13/21 [phone]

## 2021-05-13 NOTE — ED Notes (Signed)
Pt able to take sips of water with no complications.

## 2021-05-13 NOTE — Progress Notes (Signed)
Carotid artery duplex has been completed. Preliminary results can be found in CV Proc through chart review.   05/13/21 9:58 AM Scott Willis RVT

## 2021-05-13 NOTE — ED Notes (Signed)
Pt in bed, a/ox4, states he was vomiting a lot before coming to the hospital. Also had SOB but currently does not. Denies CP, ABD pain, but states still nauseated

## 2021-05-13 NOTE — Consult Note (Addendum)
Cardiology Consultation:   Patient ID: Scott Willis MRN: [id number]; DOB: 04/17/1940  Admit date: 05/13/2021 Date of Consult: 05/13/2021  PCP:  Eunice Blase, MD   [place] Prov[facility] Cardiologist:  Buford Dresser, MD        Patient Profile:   Scott Willis is a 81 y.o. male with a hx of DM2, HTN, HLD, ?PAF, tob use, chronic back pain, PUD, BPH, who is being seen 05/13/2021 for the evaluation of sinus pauses at the request of Dr Olevia Bowens.  History of Present Illness:   Mr. Heal had a televisit with Dr Harrell Gave 09/2019. He did not want meds for BP control, not interested in eval  for PAF (has palpitations), not checking CBGs, not interested in cholesterol med.  He was admitted early 01/31 for N&V, having sinus pauses on the monitor. Cards asked to see.   Mr. Scott does not have a history of syncope.  He has had some dizzy spells in the past, but they became much more significant after he got sick.  He was in his usual state of health until he laid down last night to go to bed about 7.  He became nauseated and started vomiting.  The vomiting would not stop, and some of the vomitus was dark.  He was getting lightheaded and dizzy on a regular basis.  He is not sure if these episodes would happen when he was vomiting or at other times.  Finally, his symptoms became so severe that he came to the ER.  In the ER, he was on telemetry and he had some episodes of nausea and vomiting here in the hospital.  On telemetry, he was having sinus Scott, episodes of Wenke Bach, and sinus pauses up to 7.29 seconds.  Most of the episodes happened on night shift, and I am not able to correlate episodes of vomiting with the pauses.  At this time, he is a bit groggy from Ativan given for pain as well as Compazine, given when the Zofran did not work.  However, he answers appropriately.  He is aware that his blood pressure is elevated at times up into the 742V systolic, but is  not really interested in taking more medicine.  He rarely checks his blood sugar, but goes by his A1c which he says is in the low sixes.   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    Ulcer    gastric    Past Surgical History:  Procedure Laterality Date   BACK SURGERY  1998   Dr.Nitka   BACK SURGERY  03/2006   Removed Rods Dr.Nitka    CARPAL TUNNEL RELEASE  08/2006   CARPAL TUNNEL RELEASE  08/2006   [place]    CATARACT EXTRACTION  02/21/2007   Dr.Epps    EYE SURGERY  2013   Right eye every 8 weeks    KIDNEY STONE SURGERY  08/11/2007   Dr.Tannenbaum    LITHOTRIPSY  08/11/2007   Dr.Nesi   REFRACTIVE SURGERY  09/2005   Macular Degeneration Dr.Mathews  SHOULDER ARTHROSCOPY WITH SUBACROMIAL DECOMPRESSION, ROTATOR CUFF REPAIR AND BICEP TENDON REPAIR Left 08/22/2013   Procedure:  LEFT SHOULDER DIAGNOSTIC OPERATIVE ARTHROSCOPY ,EXTENSIVE DEBRIDEMENT, BICEPS RELEASE AND TENODESIS, ROTATOR CUFF REPAIR, SUBACROMIAL DECOMPRESSION;  Surgeon: Meredith Pel, MD;  Location: [place];  Service: Orthopedics;  Laterality: Left;  LEFT SHOULDER DOA,EXTENSIVE DEBRIDEMENT, BICEPS RELEASE AND TENODESIS, ROTATOR CUFF REPAIR, SUBACROMIAL DECOMPRESSION,     Home Medications:  Prior to Admission medications   Medication Sig Start Date End Date Taking? Authorizing Provider  blood glucose meter kit and supplies KIT Check CBG QID 04/20/18   Hilts, Michael, MD  Cholecalciferol (VITAMIN D-3) 125 MCG (5000 UT) TABS Take 1 tablet by mouth daily.    [provider]  DIAZEPAM PO Take 1 tablet by mouth once. Reports takes a couple hours  prior to eye injection    [provider]  diclofenac sodium (VOLTAREN) 1 % GEL Apply 4 g topically 4 (four) times daily as needed. 09/15/18   Hilts, Legrand Como, MD  finasteride (PROSCAR) 5 MG tablet TAKE 1 TABLET BY MOUTH EVERY DAY 09/21/19   Hilts, Legrand Como, MD  JANUVIA 100 MG tablet TAKE 1 TABLET BY MOUTH EVERY DAY 08/21/20   Hilts, Legrand Como, MD  Magnesium Oxide 400 (240 Mg) MG TABS TAKE 1 TABLET BY MOUTH EVERY DAY 02/04/20   Hilts, Legrand Como, MD  moxifloxacin (VIGAMOX) 0.5 % ophthalmic solution Place 1 drop into the right eye 3 (three) times daily. Every 16 weeks for 4 days after surgery    [provider]  Multiple Vitamins-Minerals (PRESERVISION AREDS 2 PO) Take by mouth daily.    [provider]  mupirocin ointment (BACTROBAN) 2 % Apply 1 application topically 2 (two) times daily. 11/04/20   Lavonna Monarch, MD  oxyCODONE-acetaminophen (PERCOCET/ROXICET) 5-325 MG tablet Take 1-2 tablets by mouth every 6 (six) hours as needed for severe pain. 05/24/19   Hilts, Legrand Como, MD  Polyethyl Glycol-Propyl Glycol (SYSTANE OP) Apply 1 drop to eye as needed (dry eyes).     [provider]  polyethylene glycol (GOLYTELY) 236 g solution Use once daily until bowels are moving normally. 08/20/20   Hilts, Legrand Como, MD  Prodigy Twist Top Lancets 28G MISC  02/14/19   [provider]  tamsulosin (FLOMAX) 0.4 MG CAPS capsule TAKE 1 CAPSULE BY MOUTH EVERY DAY 09/21/19   Hilts, Legrand Como, MD  trimethoprim-polymyxin b (POLYTRIM) ophthalmic solution INSTILL 1 DROP INTO RIGHT EYE 4 TIMES DAILY FOR 2 DAYS AFTER EACH MONTHLY EYE INJECTION 10/28/18   [provider]  venlafaxine (EFFEXOR) 75 MG tablet Take 1 tablet (75 mg total) by mouth 2 (two) times daily. 06/20/19   Hilts, Legrand Como, MD    Inpatient Medications: Scheduled Meds:   stroke: mapping our early stages of recovery book   Does not apply Once   pantoprazole (PROTONIX) IV  40 mg Intravenous Once   prochlorperazine  5 mg  Intravenous Once   Continuous Infusions:  lactated ringers     PRN Meds: acetaminophen **OR** acetaminophen, LORazepam, ondansetron **OR** ondansetron (ZOFRAN) IV  Allergies:    Allergies  Allergen Reactions   Bee Pollen Anaphylaxis   Tape Other (See Comments)    Any medical tape, Rash  Any medical tape, Rash  Any medical tape, Rash    Fluorescein-Benoxinate Other (See Comments)    Rash  Rash    Metformin Other (See Comments)    Severe Diarrhea  Severe Diarrhea    Metformin And Related     Severe Diarrhea    Morphine  And Related Other (See Comments)    Kidney failure  Kidney failure  Kidney failure    Statins Other (See Comments)    Loss of appetite Loss of appetite Loss of appetite    Social History:   Social History   Socioeconomic History   Marital status: Single    Spouse name: Not on file   Number of children: Not on file   Years of education: Not on file   Highest education level: Not on file  Occupational History    Employer: [employer]    Comment: owner of printing company--[address]  Tobacco Use   Smoking status: Every Day    Packs/day: 1.50    Years: 59.00    Pack years: 88.50    Types: Cigarettes   Smokeless tobacco: Never  Substance and Sexual Activity   Alcohol use: No    Alcohol/week: 0.0 standard drinks   Drug use: No   Sexual activity: Not on file  Other Topics Concern   Not on file  Social History Narrative   Not on file   Social Determinants of Health   Financial Resource Strain: Not on file  Food Insecurity: Not on file  Transportation Needs: Not on file  Physical Activity: Not on file  Stress: Not on file  Social Connections: Not on file  Intimate Partner Violence: Not on file    Family History:   Family History  Problem Relation Age of Onset   Alzheimer's disease Mother    Colon cancer Father    Diabetes Sister      ROS:  Please see the history of present illness.  All other ROS reviewed and negative.      Physical Exam/Data:   Vitals:   05/13/21 0401 05/13/21 0500 05/13/21 0517  BP: 134/70 (!) 170/94   Pulse: 84 71   Resp: 18 (!) 23   Temp: 97.6 F (36.4 C)    TempSrc: Oral    SpO2: 98% 96%   Weight:   70.3 kg  Height:   5' 10.5" (1.791 m)    Intake/Output Summary (Last 24 hours) at 05/13/2021 0801 Last data filed at 05/13/2021 7341 Gross per 24 hour  Intake 1000 ml  Output 6 ml  Net 994 ml   Last 3 Weights 05/13/2021 11/19/2020 08/20/2020  Weight (lbs) 155 lb 145 lb 6.4 oz 145 lb 6.4 oz  Weight (kg) 70.308 kg 65.953 kg 65.953 kg     Body mass index is 21.93 kg/m.  General:  Well nourished, well developed, elderly male, sleepy but in no acute distress HEENT: normal Neck: no JVD Vascular: No carotid bruits; Distal pulses 2+ bilaterally Cardiac:  normal S1, S2; RRR; no murmur  Lungs:  clear to auscultation bilaterally, no wheezing, rhonchi or rales  Abd: soft, nontender, no hepatomegaly  Ext: no edema Musculoskeletal:  No deformities, BUE and BLE strength normal and equal Skin: warm and dry  Neuro:  CNs 2-12 intact, no focal abnormalities noted Psych:  Normal affect   EKG:  The EKG was personally reviewed and demonstrates:  ?Wenkebach w/ HR 87, different from prev ECGs Telemetry:  Telemetry was personally reviewed and demonstrates: Sinus Scott, [employer], pauses up to 7.29 seconds, but heart rate is not sustained below 35  Relevant CV Studies:  ECHO: 06/24/2015 Mild concentric LVH w/ EF 50%, no WMA Mild LAE Mild AR Mild-mod MR   Laboratory Data:  High Sensitivity Troponin:   Recent Labs  Lab 05/13/21 0414 05/13/21 0627  TROPONINIHS  3 4     Chemistry Recent Labs  Lab 05/13/21 0414 05/13/21 0432  NA 136 139  K 3.9 4.0  CL 103 104  CO2 25  --   GLUCOSE 216* 220*  BUN 15 12  CREATININE 1.31* 1.50*  CALCIUM 9.4  --   GFRNONAA 55*  --   ANIONGAP 8  --     Recent Labs  Lab 05/13/21 0414  PROT 6.8  ALBUMIN 3.6  AST 19  ALT 13  ALKPHOS 48   BILITOT 0.9   Lipids No results for input(s): CHOL, TRIG, HDL, LABVLDL, LDLCALC, CHOLHDL in the last 168 hours.  Hematology Recent Labs  Lab 05/13/21 0414 05/13/21 0432  WBC 8.9  --   RBC 4.21*  --   HGB 12.8* 13.3  HCT 39.1 39.0  MCV 92.9  --   MCH 30.4  --   MCHC 32.7  --   RDW 13.4  --   PLT 184  --    Thyroid No results for input(s): TSH, FREET4 in the last 168 hours.  BNPNo results for input(s): BNP, PROBNP in the last 168 hours.  DDimer No results for input(s): DDIMER in the last 168 hours.   Radiology/Studies:  CT HEAD WO CONTRAST  Result Date: 05/13/2021 CLINICAL DATA:  Dizziness, non-specific EXAM: CT HEAD WITHOUT CONTRAST TECHNIQUE: Contiguous axial images were obtained from the base of the skull through the vertex without intravenous contrast. RADIATION DOSE REDUCTION: This exam was performed according to the departmental dose-optimization program which includes automated exposure control, adjustment of the mA and/or kV according to patient size and/or use of iterative reconstruction technique. COMPARISON:  CT head 04/23/2015. FINDINGS: Brain: New hypodensity in the left aspect of pons (for example: Series 5, image 26; series 2 image 9; series 4, images 37/38.). No evidence of acute hemorrhage, hydrocephalus, mass lesion, midline shift or extra-axial hemorrhage. Cerebral atrophy. Patchy white matter hypoattenuation, nonspecific but compatible with chronic microvascular disease. Vascular: No hyperdense vessel identified. Calcific intracranial atherosclerosis. Skull: No acute fracture. Sinuses/Orbits: Clear sinuses.  Unremarkable orbits. Other: No mastoid effusions. IMPRESSION: Hypodensity in the left aspect of the pons, suspicious for infarct that is new since 2017 but otherwise age indeterminate by CT. MRI could further evaluate for acute infarct. Electronically Signed   By: Margaretha Sheffield M.D.   On: 05/13/2021 05:19   DG CHEST PORT 1 VIEW  Result Date:  05/13/2021 CLINICAL DATA:  81 year old male with vomiting. EXAM: PORTABLE CHEST 1 VIEW COMPARISON:  Chest radiographs 04/23/2015 and earlier. FINDINGS: Portable AP semi upright view at 0433 hours. Stable lung volumes. Mediastinal contours remain normal. Visualized tracheal air column is within normal limits. No pneumothorax or pulmonary edema. Lung base ventilation appears stable since 2017, no consolidation or confluent opacity. No acute osseous abnormality identified. Paucity of bowel gas in the upper abdomen. IMPRESSION: No acute cardiopulmonary abnormality. Electronically Signed   By: Genevie Ann M.D.   On: 05/13/2021 04:44     Assessment and Plan:   Pauses on telemetry -This is in the setting of nausea and vomiting - Longest pause was 7.29 seconds with prolonged RR intervals on either side - Several pauses seen greater than 5 seconds with ventricular escape beatsAppears s - No sustained bradycardia less than 40 - He is not on any heart rate lowering medication - Appears symptomatic, reports near syncopal episodes overnight - Could be vagal response to N/V, but he also reports has been having episodes of near syncope while not having any  nausea or vomiting.  Recommend admitting to [facility] and will plan for EP evaluation  2.  Nausea and vomiting - The Ativan and Compazine seem to finally have worked, the [place] did not seem to do a lot of good - Because of his symptoms is not clear - Per IM  3.  Abnormal head CT - Findings concerning for an infarct that is new from 2017, but no way to tell if it is recent.  Brain MRI ordered - Management per IM   Risk Assessment/Risk Scores:       For questions or updates, please contact [facility] Please consult [email or web address] for contact info under    Signed, Rosaria Ferries, PA-C  05/13/2021 8:01 AM   Patient seen and examined.  Agree with above documentation.  Mr. Szabo is an 81 year old male with a history of T2DM, hypertension,  hyperlipidemia, possible paroxysmal atrial fibrillation, PUD who we are consulted by Dr. Olevia Bowens for evaluation of sinus pauses.  He follows with Dr. Harrell Gave, last seen 09/2019.  He reported a history of atrial fibrillation though there was not documentation of this and he declined anticoagulation.  He presented to the ED this morning with nausea and vomiting.  Initial vital signs notable for BP 134/70, pulse 84, SPO2 98% on room air.  Labs notable for potassium 3.9, creatinine 1.3, troponin 3 > 4.  Chest x-ray showed no acute abnormality.  Head CT showed hypodensity in pons suspicious for age-indeterminate infarct, MRI recommended.  EKG shows normal sinus Scott with PACs, rate 87.  On exam, patient is somnolent but arousable, regular rate and Scott, no murmurs, lungs CTAB, no LE edema or JVD.  Review of telemetry shows periods of Wenckebach, sinus pauses up to 7.3 seconds.  For his sinus pauses, could represent vagal response in setting of nausea/vomiting.  He did report near syncopal episodes overnight.  However he also reports he has been having near syncopal episodes that have occurred outside of his recent nausea/vomiting.  May need PPM, will discuss with EP.  Will check echo.  Donato Heinz, MD

## 2021-05-13 NOTE — ED Provider Notes (Signed)
Pt having episodes about every 15-20 minutes with symptoms However his BP has remained elevated No syncope He is awaiting admission Endorsed to dr Kathrynn Humble while awaiting bed    Ripley Fraise, MD 05/13/21 [phone]

## 2021-05-13 NOTE — ED Notes (Signed)
Pt given blanket.

## 2021-05-13 NOTE — Progress Notes (Signed)
Echocardiogram 2D Echocardiogram has been performed.  Arlyss Gandy 05/13/2021, 10:24 AM

## 2021-05-14 DIAGNOSIS — R112 Nausea with vomiting, unspecified: Secondary | ICD-10-CM

## 2021-05-14 DIAGNOSIS — R930 Abnormal findings on diagnostic imaging of skull and head, not elsewhere classified: Secondary | ICD-10-CM | POA: Diagnosis not present

## 2021-05-14 DIAGNOSIS — N1831 Chronic kidney disease, stage 3a: Secondary | ICD-10-CM | POA: Diagnosis present

## 2021-05-14 DIAGNOSIS — F419 Anxiety disorder, unspecified: Secondary | ICD-10-CM | POA: Diagnosis present

## 2021-05-14 DIAGNOSIS — R55 Syncope and collapse: Secondary | ICD-10-CM

## 2021-05-14 DIAGNOSIS — E785 Hyperlipidemia, unspecified: Secondary | ICD-10-CM | POA: Diagnosis present

## 2021-05-14 DIAGNOSIS — Z820 Family history of epilepsy and other diseases of the nervous system: Secondary | ICD-10-CM | POA: Diagnosis not present

## 2021-05-14 DIAGNOSIS — Z888 Allergy status to other drugs, medicaments and biological substances status: Secondary | ICD-10-CM | POA: Diagnosis not present

## 2021-05-14 DIAGNOSIS — I441 Atrioventricular block, second degree: Secondary | ICD-10-CM | POA: Diagnosis present

## 2021-05-14 DIAGNOSIS — F17200 Nicotine dependence, unspecified, uncomplicated: Secondary | ICD-10-CM

## 2021-05-14 DIAGNOSIS — Z91048 Other nonmedicinal substance allergy status: Secondary | ICD-10-CM | POA: Diagnosis not present

## 2021-05-14 DIAGNOSIS — Z833 Family history of diabetes mellitus: Secondary | ICD-10-CM | POA: Diagnosis not present

## 2021-05-14 DIAGNOSIS — I480 Paroxysmal atrial fibrillation: Secondary | ICD-10-CM | POA: Diagnosis present

## 2021-05-14 DIAGNOSIS — N400 Enlarged prostate without lower urinary tract symptoms: Secondary | ICD-10-CM | POA: Diagnosis present

## 2021-05-14 DIAGNOSIS — Z9849 Cataract extraction status, unspecified eye: Secondary | ICD-10-CM | POA: Diagnosis not present

## 2021-05-14 DIAGNOSIS — F32A Depression, unspecified: Secondary | ICD-10-CM | POA: Diagnosis present

## 2021-05-14 DIAGNOSIS — E1122 Type 2 diabetes mellitus with diabetic chronic kidney disease: Secondary | ICD-10-CM | POA: Diagnosis present

## 2021-05-14 DIAGNOSIS — I10 Essential (primary) hypertension: Secondary | ICD-10-CM

## 2021-05-14 DIAGNOSIS — Z20822 Contact with and (suspected) exposure to covid-19: Secondary | ICD-10-CM | POA: Diagnosis present

## 2021-05-14 DIAGNOSIS — Z885 Allergy status to narcotic agent status: Secondary | ICD-10-CM | POA: Diagnosis not present

## 2021-05-14 DIAGNOSIS — R1110 Vomiting, unspecified: Secondary | ICD-10-CM | POA: Diagnosis present

## 2021-05-14 DIAGNOSIS — F17210 Nicotine dependence, cigarettes, uncomplicated: Secondary | ICD-10-CM | POA: Diagnosis present

## 2021-05-14 DIAGNOSIS — Z79899 Other long term (current) drug therapy: Secondary | ICD-10-CM | POA: Diagnosis not present

## 2021-05-14 DIAGNOSIS — E43 Unspecified severe protein-calorie malnutrition: Secondary | ICD-10-CM | POA: Diagnosis present

## 2021-05-14 DIAGNOSIS — K219 Gastro-esophageal reflux disease without esophagitis: Secondary | ICD-10-CM

## 2021-05-14 DIAGNOSIS — I455 Other specified heart block: Secondary | ICD-10-CM | POA: Diagnosis not present

## 2021-05-14 DIAGNOSIS — Z91030 Bee allergy status: Secondary | ICD-10-CM | POA: Diagnosis not present

## 2021-05-14 DIAGNOSIS — I129 Hypertensive chronic kidney disease with stage 1 through stage 4 chronic kidney disease, or unspecified chronic kidney disease: Secondary | ICD-10-CM | POA: Diagnosis present

## 2021-05-14 DIAGNOSIS — Z6821 Body mass index (BMI) 21.0-21.9, adult: Secondary | ICD-10-CM | POA: Diagnosis not present

## 2021-05-14 DIAGNOSIS — R001 Bradycardia, unspecified: Secondary | ICD-10-CM | POA: Diagnosis present

## 2021-05-14 LAB — HEMOGLOBIN A1C
Hgb A1c MFr Bld: 6.1 % — ABNORMAL HIGH (ref 4.8–5.6)
Mean Plasma Glucose: 128.37 mg/dL

## 2021-05-14 LAB — LIPID PANEL
Cholesterol: 193 mg/dL (ref 0–200)
HDL: 46 mg/dL (ref >40)
LDL Cholesterol: 132 mg/dL — ABNORMAL HIGH (ref 0–99)
Total CHOL/HDL Ratio: 4.2 RATIO
Triglycerides: 76 mg/dL (ref <150)
VLDL: 15 mg/dL (ref 0–40)

## 2021-05-14 MED ORDER — PANTOPRAZOLE SODIUM 40 MG IV SOLR
40.0000 mg | INTRAVENOUS | Status: DC
  Administered 2021-05-14: 40 mg via INTRAVENOUS
  Filled 2021-05-14: qty 40

## 2021-05-14 MED ORDER — DICLOFENAC SODIUM 1 % EX GEL
4.0000 g | Freq: Four times a day (QID) | CUTANEOUS | Status: DC | PRN
  Filled 2021-05-14: qty 100

## 2021-05-14 MED ORDER — MAGNESIUM OXIDE -MG SUPPLEMENT 400 (240 MG) MG PO TABS
400.0000 mg | ORAL_TABLET | Freq: Every day | ORAL | Status: DC
  Administered 2021-05-15: 400 mg via ORAL
  Filled 2021-05-14: qty 1

## 2021-05-14 MED ORDER — PROPYLENE GLYCOL 0.6 % OP SOLN: 1.0000 [drp] | Freq: Three times a day (TID) | OPHTHALMIC | Status: DC | PRN

## 2021-05-14 MED ORDER — FINASTERIDE 5 MG PO TABS
5.0000 mg | ORAL_TABLET | Freq: Every day | ORAL | Status: DC
  Administered 2021-05-20 – 2021-05-24 (×5): 5 mg via ORAL
  Filled 2021-05-14 (×8): qty 1

## 2021-05-14 MED ORDER — SODIUM CHLORIDE 0.9% FLUSH
3.0000 mL | Freq: Two times a day (BID) | INTRAVENOUS | Status: DC
  Administered 2021-05-14 – 2021-05-23 (×15): 3 mL via INTRAVENOUS

## 2021-05-14 MED ORDER — POLYVINYL ALCOHOL 1.4 % OP SOLN
1.0000 [drp] | Freq: Three times a day (TID) | OPHTHALMIC | Status: DC | PRN
  Filled 2021-05-14: qty 15

## 2021-05-14 MED ORDER — VITAMIN D3 25 MCG (1000 UNIT) PO TABS
5000.0000 [IU] | ORAL_TABLET | Freq: Every day | ORAL | Status: DC
  Filled 2021-05-14 (×2): qty 5

## 2021-05-14 NOTE — Progress Notes (Signed)
PT Cancellation Note  Patient Details Name: TYRAIL GRANDFIELD MRN: [id number] DOB: November 21, 1940   Cancelled Treatment:    Reason Eval/Treat Not Completed: Medical issues which prohibited therapy, to transfer to [facility] for cardiac issues.  [place] Pager [phone] Office [phone]     Claretha Cooper 05/14/2021, 12:38 PM

## 2021-05-14 NOTE — Progress Notes (Addendum)
Clinical/Bedside Swallow Evaluation Patient Details  Name: Scott Willis MRN: [id number] Date of Birth: 10/19/1940  Today's Date: 05/14/2021 Time: SLP Start Time (ACUTE ONLY): 4193 SLP Stop Time (ACUTE ONLY): 1010 SLP Time Calculation (min) (ACUTE ONLY): 20 min  Past Medical History:  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    Ulcer    gastric   Past Surgical History:  Past Surgical History:  Procedure Laterality Date   BACK SURGERY  1998   Dr.Nitka   BACK SURGERY  03/2006   Removed Rods Dr.Nitka    CARPAL TUNNEL RELEASE  08/2006   CARPAL TUNNEL RELEASE  08/2006   [place]    CATARACT EXTRACTION  02/21/2007   Dr.Epps    EYE SURGERY  2013   Right eye every 8 weeks    KIDNEY STONE SURGERY  08/11/2007   Dr.Tannenbaum    LITHOTRIPSY  08/11/2007   Dr.Nesi   REFRACTIVE SURGERY  09/2005   Macular Degeneration Dr.Mathews    SHOULDER ARTHROSCOPY WITH SUBACROMIAL DECOMPRESSION, ROTATOR CUFF REPAIR AND BICEP TENDON REPAIR Left 08/22/2013   Procedure:  LEFT SHOULDER DIAGNOSTIC OPERATIVE ARTHROSCOPY ,EXTENSIVE DEBRIDEMENT, BICEPS RELEASE AND TENODESIS, ROTATOR CUFF REPAIR, SUBACROMIAL DECOMPRESSION;  Surgeon: Meredith Pel, MD;  Location: [place];  Service: Orthopedics;  Laterality: Left;  LEFT SHOULDER DOA,EXTENSIVE DEBRIDEMENT, BICEPS RELEASE AND TENODESIS, ROTATOR CUFF REPAIR, SUBACROMIAL DECOMPRESSION,   HPI:  Patient is an 81 y.o. male with PMH: osteoarthritis, blindness, BPH, DM-2, HLD, macular degeneration, osteoporosis, gastric ulcer, near syncopal episode who arrived at ER with persistent nausea and multiple  episodes of emesis since yesterday. Patient denied fever but endorsed chills and decreased appetite. In ER he had some sinus pauses, positional lightheadness. He was afebrileand oxygen saturations at 95% on RA. MRI revealed: no acute intracranial pathology.  small remote infarct in the left pons corresponding to the finding on the CT. Patient awaiting transfer to [facility] for evaluation by electrophysiology as per cardiology request.    Assessment / Plan / Recommendation  Clinical Impression  Patient presents with clinically intact oropharyngeal swallow based on minimal po observed.  He has severe odynophagia to right anterior chest with swallowing, thus very minimal po provided. Pt only took 3 small sips of thin liquids, one small bite applesauce and one small bite of cracker.    During session, symptomatic heart pauses present.  Pt does admit to some issues with popcorn sticking in his throat and needing liquids to clear but otherwise denies dysphagia.    Warm liquid mitigated discomfort with swallow - thus recommend needed medications with warm water otherwise hold po until cardiology approves diet. Advised RN after session.   Of note, SLP spoke to RN before swallow eval and she reported pt was npo due to swallow evaluation needed, thus SLP proceeded with eval with minimal po.    SLP Visit Diagnosis: Dysphagia, unspecified (R13.10)    Aspiration Risk  Mild aspiration risk    Diet Recommendation  (npo x meds with warm water)   Liquid Administration via: Cup;Straw Medication Administration: Whole  meds with liquid (warm) Supervision: Full supervision/cueing for compensatory strategies    Other  Recommendations      Recommendations for follow up therapy are one component of a multi-disciplinary discharge planning process, led by the attending physician.  Recommendations may be updated based on patient status, additional functional criteria and insurance authorization.  Follow up Recommendations  No SLP follow up      Assistance Recommended at Discharge None  Functional Status Assessment Patient has not had a recent decline in their functional status (re: swallowing)  Frequency and Duration   None in house         Prognosis Prognosis for Safe Diet Advancement: Good Barriers to Reach Goals: Other (Comment) (cardiac issues)      Swallow Study   General HPI: Patient is an 81 y.o. male with PMH: osteoarthritis, blindness, BPH, DM-2, HLD, macular degeneration, osteoporosis, gastric ulcer, near syncopal episode who arrived at ER with persistent nausea and multiple episodes of emesis since yesterday. Patient denied fever but endorsed chills and decreased appetite. In ER he had some sinus pauses, positional lightheadness. He was afebrileand oxygen saturations at 95% on RA. MRI revealed: no acute intracranial pathology.  small remote infarct in the left pons corresponding to the finding on the CT. Patient awaiting transfer to [facility] for evaluation by electrophysiology as per cardiology request. Type of Study: Bedside Swallow Evaluation Diet Prior to this Study: NPO Temperature Spikes Noted: No History of Recent Intubation: No Behavior/Cognition: Alert;Cooperative;Pleasant mood Oral Cavity Assessment: Within Functional Limits Oral Care Completed by SLP: No Oral Cavity - Dentition: Dentures, top;Missing dentition;Other (Comment) (few lower dentition) Vision:  (pt legally blind) Self-Feeding Abilities: Needs set up Patient Positioning: Upright in bed Baseline Vocal Quality: Normal Volitional Cough: Strong Volitional Swallow: Able to elicit    Oral/Motor/Sensory Function Overall Oral Motor/Sensory Function: Within functional limits   Ice Chips Ice chips: Not tested   Thin Liquid Thin Liquid: Within functional limits Presentation: Self Fed;Straw;Cup    Nectar Thick Nectar Thick Liquid: Not tested   Honey Thick Honey Thick Liquid: Not tested   Puree Puree: Within functional  limits Presentation: Self Fed;Spoon   Solid     Solid: Within functional limits Presentation: Self Scott Willis 05/14/2021,12:00 PM  Scott Lime, MS [facility] SLP [place] Office [phone] Cell [phone]

## 2021-05-14 NOTE — ED Notes (Signed)
[facility] called to set-up transport to [facility]

## 2021-05-14 NOTE — Progress Notes (Signed)
PROGRESS NOTE    Scott Willis  OZD:[id number] DOB: 18-Jun-1940 DOA: 05/13/2021 PCP: Eunice Blase, MD    Chief Complaint  Patient presents with   Emesis    Brief Narrative:  Patient is 81 year old gentleman history of osteoarthritis, hypertension, right eye blepharitis, blindness, BPH, type 2 diabetes, hyperlipidemia, nephrolithiasis, hypogonadism, osteoporosis in the setting of androgen replacement therapy, gastric ulcer, near syncopal episode presented to the ED with persistent nausea, multiple episodes of emesis.  Patient denied any diarrhea constipation melena hematochezia.  Patient denied any urinary symptoms.  Patient noted in the ED to have some sinus pauses.  Patient also noted to have some positional lightheadedness but no chest pain palpitations or diaphoresis.  Patient seen in the ED COVID-19 PCR negative.  Influenza PCR negative.  Cardiac enzymes normal.  Urinalysis with some glycosuria.  Chest x-ray done no acute cardiopulmonary disease.  Head CT showed hypodensity in the left aspect of the pons suspicious for infarct new since 2017, MRI brain done with small remote infarct in the left pons.  Patient noted to have ongoing multiple pauses.  Patient seen in consultation by cardiology who recommended transfer to [facility] for evaluation by EP and possible PPM placement.  2D echo done.   Assessment & Plan:   Principal Problem:   Sinus pause Active Problems:   Benign essential hypertension   BPH (benign prostatic hyperplasia)   Type 2 diabetes mellitus (HCC)   Tobacco use disorder   Depression   Near syncope   Gastro-esophageal reflux disease without esophagitis   Stage 3a chronic kidney disease (CKD) (HCC)   Intractable nausea and vomiting   Borderline prolonged QT interval   Abnormal head CT  #1 sinus pauses noted on telemetry -Patient noted to have multiple sinus pauses since admission noted on telemetry with some intermittent occasional odynophagia noted  by evaluation by speech therapy. -Patient noted to have lightheadedness denies any syncopal episodes. -Patient noted to have multiple sinus pauses greater than 5 seconds. -2D echo done with EF of 60 to 25%,ZDGL, grade 1 diastolic dysfunction, normal right ventricular systolic function.  No significant valvular abnormalities. -Patient seen in consultation by cardiology who are recommending transfer to [facility] for evaluation by EP for probable PPM placement. -Pacer pads at bedside. -Per cardiology.  2.  Abnormal head CT/remote infarct -Patient presented with some mild slurred speech per admitting physician, some dizziness, nausea or emesis. -CT head done with age-indeterminate CVA noted. -MRI brain done with remote infarct noted in the left pons region. -Carotid Dopplers with no significant ICA stenosis. -2D echo with no embolic source is noted. -PT/OT/ST. -May consider neurology evaluation once patient arrives at [place] factor modification.  3.  Intractable nausea vomiting -Improved on Compazine. -Placed on PPI.  4.  Borderline prolonged QT interval -Avoid QT prolonging medications. -Repeat EKG with QTC of 509. -Replete electrolytes keep potassium approximately 4, magnesium greater than 2. -Cardiology following.  5.  Odynophagia -Patient with complaints of difficulty swallowing which was noted by speech therapy associated with oral intake. -Keep n.p.o. for now.  Follow -Post management of problem #1, will likely need a barium swallow and evaluation by GI. -Placed on IV PPI  6.  Hypertension -Permissive hypertension. -Once tolerating oral intake can start on oral antihypertensive for better blood pressure management. -Cardiology following.  7.  Tobacco use disorder -Tobacco cessation.  8.  BPH -Resume home regimen Proscar.  9.  GERD -IV PPI.  10.  Stage IIIa CKD -  Stable.  11.  Diabetes mellitus type 2 -Hemoglobin A1c 6.1  (05/14/2021) -CBG 103 this morning. -Currently NPO. -Continue serial CBG.    DVT prophylaxis: SCDs Code Status: Full Family Communication: Updated patient.  No family at bedside. Disposition:   Status is: Observation The patient remains OBS appropriate and will d/c before 2 midnights.  Planned Discharge Destination: To be determined        Consultants:  Cardiology: Dr. Gardiner Rhyme 05/13/2021  Procedures: 2D echo 05/13/2021 Carotid Dopplers 05/13/2021 MRI brain 05/13/2021 Chest x-ray 05/13/2021 CT head without contrast 05/13/2021      Antimicrobials: None   Subjective: Sitting up on gurney. No SOB. No left sided CP, Some complaints of right sided CP intermittent and noted with swallowing.  Awaiting transfer to [facility]. Patient with multiple 3-5 sec pauses noted on telemetry.   Objective: Vitals:   05/14/21 0530 05/14/21 0600 05/14/21 0630 05/14/21 0700  BP: (!) 158/73 (!) 147/71 (!) 154/78 (!) 157/61  Pulse: (!) 54 76 74 60  Resp: 10 17 20 13   Temp:      TempSrc:      SpO2: 95% 93% 94% 96%  Weight:      Height:        Intake/Output Summary (Last 24 hours) at 05/14/2021 0855 Last data filed at 05/14/2021 0356 Gross per 24 hour  Intake --  Output 250 ml  Net -250 ml   Filed Weights   05/13/21 0517  Weight: 70.3 kg    Examination:  General exam: Appears calm and comfortable  Respiratory system: Clear to auscultation.  No wheezes, no crackles, no rhonchi.  Respiratory effort normal. Cardiovascular system: S1 & S2 heard, RRR. No JVD, murmurs, rubs, gallops or clicks. No pedal edema. Gastrointestinal system: Abdomen is nondistended, soft and nontender. No organomegaly or masses felt. Normal bowel sounds heard. Central nervous system: Alert and oriented. No focal neurological deficits. Extremities: Symmetric 5 x 5 power. Skin: No rashes, lesions or ulcers Psychiatry: Judgement and insight appear normal. Mood & affect appropriate.     Data Reviewed: I have  personally reviewed following labs and imaging studies  CBC: Recent Labs  Lab 05/13/21 0414 05/13/21 0432  WBC 8.9  --   NEUTROABS 6.1  --   HGB 12.8* 13.3  HCT 39.1 39.0  MCV 92.9  --   PLT 184  --     Basic Metabolic Panel: Recent Labs  Lab 05/13/21 0414 05/13/21 0432 05/13/21 0627  NA 136 139  --   K 3.9 4.0  --   CL 103 104  --   CO2 25  --   --   GLUCOSE 216* 220*  --   BUN 15 12  --   CREATININE 1.31* 1.50*  --   CALCIUM 9.4  --   --   MG  --   --  2.0    GFR: Estimated Creatinine Clearance: 39.1 mL/min (A) (by C-G formula based on SCr of 1.5 mg/dL (H)).  Liver Function Tests: Recent Labs  Lab 05/13/21 0414  AST 19  ALT 13  ALKPHOS 48  BILITOT 0.9  PROT 6.8  ALBUMIN 3.6    CBG: No results for input(s): GLUCAP in the last 168 hours.   Recent Results (from the past 240 hour(s))  Resp Panel by RT-PCR (Flu A&B, Covid) Nasopharyngeal Swab     Status: None   Collection Time: 05/13/21  4:11 AM   Specimen: Nasopharyngeal Swab; Nasopharyngeal(NP) swabs in vial transport medium  Result Value Ref Range  Status   SARS Coronavirus 2 by RT PCR NEGATIVE NEGATIVE Final    Comment: (NOTE) SARS-CoV-2 target nucleic acids are NOT DETECTED.  The SARS-CoV-2 RNA is generally detectable in upper respiratory specimens during the acute phase of infection. The lowest concentration of SARS-CoV-2 viral copies this assay can detect is 138 copies/mL. A negative result does not preclude SARS-Cov-2 infection and should not be used as the sole basis for treatment or other patient management decisions. A negative result may occur with  improper specimen collection/handling, submission of specimen other than nasopharyngeal swab, presence of viral mutation(s) within the areas targeted by this assay, and inadequate number of viral copies(<138 copies/mL). A negative result must be combined with clinical observations, patient history, and epidemiological information. The expected  result is Negative.  Fact Sheet for Patients:  EntrepreneurPulse.com.au  Fact Sheet for Healthcare Providers:  IncredibleEmployment.be  This test is no t yet approved or cleared by the [place] FDA and  has been authorized for detection and/or diagnosis of SARS-CoV-2 by FDA under an Emergency Use Authorization (EUA). This EUA will remain  in effect (meaning this test can be used) for the duration of the COVID-19 declaration under Section 564(b)(1) of the Act, 21 U.S.C.section 360bbb-3(b)(1), unless the authorization is terminated  or revoked sooner.       Influenza A by PCR NEGATIVE NEGATIVE Final   Influenza B by PCR NEGATIVE NEGATIVE Final    Comment: (NOTE) The Xpert Xpress SARS-CoV-2/FLU/RSV plus assay is intended as an aid in the diagnosis of influenza from Nasopharyngeal swab specimens and should not be used as a sole basis for treatment. Nasal washings and aspirates are unacceptable for Xpert Xpress SARS-CoV-2/FLU/RSV testing.  Fact Sheet for Patients: EntrepreneurPulse.com.au  Fact Sheet for Healthcare Providers: IncredibleEmployment.be  This test is not yet approved or cleared by the [place] FDA and has been authorized for detection and/or diagnosis of SARS-CoV-2 by FDA under an Emergency Use Authorization (EUA). This EUA will remain in effect (meaning this test can be used) for the duration of the COVID-19 declaration under Section 564(b)(1) of the Act, 21 U.S.C. section 360bbb-3(b)(1), unless the authorization is terminated or revoked.  Performed at [facility], [place] [address] [place], [place] [address]          Radiology Studies: CT HEAD WO CONTRAST  Result Date: 05/13/2021 CLINICAL DATA:  Dizziness, non-specific EXAM: CT HEAD WITHOUT CONTRAST TECHNIQUE: Contiguous axial images were obtained from the base of the skull through the vertex without  intravenous contrast. RADIATION DOSE REDUCTION: This exam was performed according to the departmental dose-optimization program which includes automated exposure control, adjustment of the mA and/or kV according to patient size and/or use of iterative reconstruction technique. COMPARISON:  CT head 04/23/2015. FINDINGS: Brain: New hypodensity in the left aspect of pons (for example: Series 5, image 26; series 2 image 9; series 4, images 37/38.). No evidence of acute hemorrhage, hydrocephalus, mass lesion, midline shift or extra-axial hemorrhage. Cerebral atrophy. Patchy white matter hypoattenuation, nonspecific but compatible with chronic microvascular disease. Vascular: No hyperdense vessel identified. Calcific intracranial atherosclerosis. Skull: No acute fracture. Sinuses/Orbits: Clear sinuses.  Unremarkable orbits. Other: No mastoid effusions. IMPRESSION: Hypodensity in the left aspect of the pons, suspicious for infarct that is new since 2017 but otherwise age indeterminate by CT. MRI could further evaluate for acute infarct. Electronically Signed   By: Margaretha Sheffield M.D.   On: 05/13/2021 05:19   MR BRAIN WO CONTRAST  Result Date: 05/13/2021 CLINICAL DATA:  Nausea, multiple episodes of emesis, possible stroke EXAM: MRI HEAD WITHOUT CONTRAST TECHNIQUE: Multiplanar, multiecho pulse sequences of the brain and surrounding structures were obtained without intravenous contrast. COMPARISON:  CT head obtained earlier the same day FINDINGS: Brain: There is no evidence of acute intracranial hemorrhage, extra-axial fluid collection, or acute infarct. There is a small remote infarct in the left pons corresponding to the finding on the same-day head CT. There is a background of mild global parenchymal volume loss with prominence of the ventricular system and extra-axial CSF spaces. Patchy FLAIR signal abnormality throughout the subcortical and periventricular white matter is nonspecific but likely reflects sequela of  moderate chronic white matter microangiopathy. There is no mass lesion.  There is no midline shift. Vascular: Normal flow voids. Skull and upper cervical spine: Normal marrow signal. Sinuses/Orbits: The paranasal sinuses are clear. Bilateral lens implants are in place. The globes and orbits are otherwise unremarkable. Other: None. IMPRESSION: 1. No acute intracranial pathology. 2. Small remote infarct in the left pons corresponding to the finding on the CT. 3. Moderate chronic white matter microangiopathy. Electronically Signed   By: Valetta Mole M.D.   On: 05/13/2021 12:22   DG CHEST PORT 1 VIEW  Result Date: 05/13/2021 CLINICAL DATA:  81 year old male with vomiting. EXAM: PORTABLE CHEST 1 VIEW COMPARISON:  Chest radiographs 04/23/2015 and earlier. FINDINGS: Portable AP semi upright view at 0433 hours. Stable lung volumes. Mediastinal contours remain normal. Visualized tracheal air column is within normal limits. No pneumothorax or pulmonary edema. Lung base ventilation appears stable since 2017, no consolidation or confluent opacity. No acute osseous abnormality identified. Paucity of bowel gas in the upper abdomen. IMPRESSION: No acute cardiopulmonary abnormality. Electronically Signed   By: Genevie Ann M.D.   On: 05/13/2021 04:44   ECHOCARDIOGRAM COMPLETE  Result Date: 05/13/2021    ECHOCARDIOGRAM REPORT   Patient Name:   Scott Willis Date of Exam: 05/13/2021 Medical Rec #:  [id number]          Height:       70.5 in Accession #:    [id number]         Weight:       155.0 lb Date of Birth:  [birth year]           BSA:          1.883 m Patient Age:    7 years           BP:           139/65 mmHg Patient Gender: M                  HR:           87 bpm. Exam Location:  Inpatient Procedure: 2D Echo Indications:    Stroke  History:        Patient has prior history of Echocardiogram examinations, most                 recent 06/24/2015. Risk Factors:Hypertension, Diabetes and                 Dyslipidemia.   Sonographer:    Arlyss Gandy Referring Phys: [id number] DAVID MANUEL [place]  1. Left ventricular ejection fraction, by estimation, is 60 to 65%. The left ventricle has normal function. The left ventricle has no regional wall motion abnormalities. Left ventricular diastolic parameters are consistent with Grade I diastolic dysfunction (impaired relaxation).  2. Right ventricular systolic function is normal. The right ventricular  size is normal. There is normal pulmonary artery systolic pressure.  3. The mitral valve is normal in structure. Mild mitral valve regurgitation. No evidence of mitral stenosis.  4. The aortic valve is normal in structure. Aortic valve regurgitation is trivial. No aortic stenosis is present.  5. The inferior vena cava is normal in size with greater than 50% respiratory variability, suggesting right atrial pressure of 3 mmHg. Conclusion(s)/Recommendation(s): No intracardiac source of embolism detected on this transthoracic study. Consider a transesophageal echocardiogram to exclude cardiac source of embolism if clinically indicated. FINDINGS  Left Ventricle: Left ventricular ejection fraction, by estimation, is 60 to 65%. The left ventricle has normal function. The left ventricle has no regional wall motion abnormalities. The left ventricular internal cavity size was normal in size. There is  no left ventricular hypertrophy. Left ventricular diastolic parameters are consistent with Grade I diastolic dysfunction (impaired relaxation). Right Ventricle: The right ventricular size is normal. No increase in right ventricular wall thickness. Right ventricular systolic function is normal. There is normal pulmonary artery systolic pressure. The tricuspid regurgitant velocity is 2.28 m/s, and  with an assumed right atrial pressure of 3 mmHg, the estimated right ventricular systolic pressure is 16.1 mmHg. Left Atrium: Left atrial size was normal in size. Right Atrium: Right atrial size was  normal in size. Pericardium: There is no evidence of pericardial effusion. Mitral Valve: The mitral valve is normal in structure. Mild mitral valve regurgitation. No evidence of mitral valve stenosis. Tricuspid Valve: The tricuspid valve is normal in structure. Tricuspid valve regurgitation is not demonstrated. No evidence of tricuspid stenosis. Aortic Valve: The aortic valve is normal in structure. Aortic valve regurgitation is trivial. No aortic stenosis is present. Aortic valve mean gradient measures 4.0 mmHg. Aortic valve peak gradient measures 7.8 mmHg. Aortic valve area, by VTI measures 1.90 cm. Pulmonic Valve: The pulmonic valve was normal in structure. Pulmonic valve regurgitation is not visualized. No evidence of pulmonic stenosis. Aorta: The aortic root is normal in size and structure. Venous: The inferior vena cava is normal in size with greater than 50% respiratory variability, suggesting right atrial pressure of 3 mmHg. IAS/Shunts: No atrial level shunt detected by color flow Doppler.  LEFT VENTRICLE PLAX 2D LVIDd:         4.30 cm   Diastology LVIDs:         3.00 cm   LV e' medial:    14.10 cm/s LV PW:         1.10 cm   LV E/e' medial:  5.0 LV IVS:        1.10 cm   LV e' lateral:   7.07 cm/s LVOT diam:     2.00 cm   LV E/e' lateral: 10.1 LV SV:         62 LV SV Index:   33 LVOT Area:     3.14 cm  RIGHT VENTRICLE RV Basal diam:  3.10 cm RV Mid diam:    2.70 cm RV S prime:     14.60 cm/s TAPSE (M-mode): 1.9 cm LEFT ATRIUM             Index        RIGHT ATRIUM           Index LA diam:        3.80 cm 2.02 cm/m   RA Area:     12.80 cm LA Vol (A2C):   26.9 ml 14.29 ml/m  RA Volume:   25.70 ml  13.65 ml/m LA Vol (A4C):   49.2 ml 26.13 ml/m LA Biplane Vol: 37.2 ml 19.76 ml/m  AORTIC VALVE AV Area (Vmax):    1.94 cm AV Area (Vmean):   2.03 cm AV Area (VTI):     1.90 cm AV Vmax:           140.00 cm/s AV Vmean:          89.400 cm/s AV VTI:            0.326 m AV Peak Grad:      7.8 mmHg AV Mean Grad:       4.0 mmHg LVOT Vmax:         86.50 cm/s LVOT Vmean:        57.900 cm/s LVOT VTI:          0.197 m LVOT/AV VTI ratio: 0.60  AORTA Ao Root diam: 3.10 cm Ao Asc diam:  3.10 cm MITRAL VALVE               TRICUSPID VALVE MV Area (PHT): 2.39 cm    TR Peak grad:   20.8 mmHg MV Decel Time: 317 msec    TR Vmax:        228.00 cm/s MV E velocity: 71.10 cm/s MV A velocity: 84.00 cm/s  SHUNTS MV E/A ratio:  0.85        Systemic VTI:  0.20 m                            Systemic Diam: 2.00 cm Candee Furbish MD Electronically signed by Candee Furbish MD Signature Date/Time: 05/13/2021/12:33:49 PM    Final    VAS US CAROTID (at [facility] and [facility] only)  Result Date: 05/13/2021 Carotid Arterial Duplex Study Patient Name:  Scott Willis [facility]  Date of Exam:   05/13/2021 Medical Rec #: [id number]           Accession #:    [id number] Date of Birth: 12-Jun-1940            Patient Gender: M Patient Age:   45 years Exam Location:  [facility] Procedure:      VAS US CAROTID Referring Phys: Shanon Brow ORTIZ --------------------------------------------------------------------------------  Indications:       CVA. Risk Factors:      Hypertension, Diabetes. Limitations        Today's exam was limited due to the patient's respiratory                    variation and Intractable nausea and vomiting. Comparison Study:  No prior studies. Performing Technologist: Oliver Hum RVT  Examination Guidelines: A complete evaluation includes B-mode imaging, spectral Doppler, color Doppler, and power Doppler as needed of all accessible portions of each vessel. Bilateral testing is considered an integral part of a complete examination. Limited examinations for reoccurring indications may be performed as noted.  Right Carotid Findings: +----------+--------+-------+--------+--------------------------------+--------+             PSV cm/s EDV     Stenosis Plaque Description               Comments                       cm/s                                                         +----------+--------+-------+--------+--------------------------------+--------+  CCA Prox   115      10               smooth and heterogenous                    +----------+--------+-------+--------+--------------------------------+--------+  CCA Distal 99       14               smooth and heterogenous                    +----------+--------+-------+--------+--------------------------------+--------+  ICA Prox   109      15               calcific, smooth and                                                             heterogenous                               +----------+--------+-------+--------+--------------------------------+--------+  ICA Distal 65       14                                                          +----------+--------+-------+--------+--------------------------------+--------+  ECA        106      15                                                          +----------+--------+-------+--------+--------------------------------+--------+ +----------+--------+-------+--------+-------------------+             PSV cm/s EDV cms Describe Arm Pressure (mmHG)  +----------+--------+-------+--------+-------------------+  Subclavian 53                                             +----------+--------+-------+--------+-------------------+ +---------+--------+--+--------+-+  Vertebral PSV cm/s 35 EDV cm/s 7  +---------+--------+--+--------+-+  Left Carotid Findings: +----------+--------+-------+--------+--------------------------------+--------+             PSV cm/s EDV     Stenosis Plaque Description               Comments                       cm/s                                                        +----------+--------+-------+--------+--------------------------------+--------+  CCA Prox   99       20               smooth and heterogenous                    +----------+--------+-------+--------+--------------------------------+--------+  CCA Distal 117      12               smooth and heterogenous                     +----------+--------+-------+--------+--------------------------------+--------+  ICA Prox   214      29               calcific, smooth and                                                             heterogenous                               +----------+--------+-------+--------+--------------------------------+--------+  ICA Distal 111      14                                                tortuous  +----------+--------+-------+--------+--------------------------------+--------+  ECA        140      18                                                          +----------+--------+-------+--------+--------------------------------+--------+ +----------+--------+--------+--------+-------------------+             PSV cm/s EDV cm/s Describe Arm Pressure (mmHG)  +----------+--------+--------+--------+-------------------+  Subclavian 106                                             +----------+--------+--------+--------+-------------------+ +---------+--------+--+--------+-+---------+  Vertebral PSV cm/s 31 EDV cm/s 5 Antegrade  +---------+--------+--+--------+-+---------+   Summary: Right Carotid: Velocities in the right ICA are consistent with a 1-39% stenosis. Left Carotid: Velocities in the left ICA are consistent with a 1-39% stenosis. Vertebrals: Bilateral vertebral arteries demonstrate antegrade flow. *See table(s) above for measurements and observations.  Electronically signed by Deitra Mayo MD on 05/13/2021 at 1:09:44 PM.    Final         Scheduled Meds:  finasteride  5 mg Oral Daily   magnesium oxide  400 mg Oral Daily   Vitamin D-3  5,000 Units Oral Daily   Continuous Infusions:  lactated ringers 100 mL/hr at 05/14/21 0501     LOS: 0 days    Time spent: 45 minutes    Irine Seal, MD [facility]   To contact the attending provider between 7A-7P or the covering provider during after hours 7P-7A, please log into the web site [email or web address] and access using  universal [place] password for that web site. If you do not have the password, please call the hospital operator.  05/14/2021, 8:55 AM

## 2021-05-14 NOTE — ED Notes (Signed)
Pt complaining of sharp chest pain. While pt is having pain it is noted that he is having a pause on the cardiac monitor.  Pain does not last long

## 2021-05-14 NOTE — Progress Notes (Signed)
Patient seen for clinical swallow evaluation.  Full report to follow.  Pt without indication of aspiration with all po - did not conduct 3 ounce Yale due to pt's cardiac status and odynophagia (severe isolated to right anterior chest) with swallows.  Recommend pt have medications needed with room temperature water - however given level of odynophagia and cardiac concerns - rec maintain npo .  When MD indicates = advise to advance to dys3/thin due to pt report of premorbid issues with popcorn stasis in pharynx requiring liquids to clear. Advised pt and RN to recommendations.   Kathleen Lime, [place] SLP [place] Office [phone] Cell [phone]

## 2021-05-14 NOTE — Progress Notes (Signed)
OT Cancellation Note  Patient Details Name: Scott Willis MRN: [id number] DOB: 1941/01/15   Cancelled Treatment:    Reason Eval/Treat Not Completed: Medical issues which prohibited therapy Patient noted to have increased frequency of pauses and pain in chest per chart. OT to continue to follow and check back when patient is more medically stable.  Jackelyn Poling OTR/L, [place] [facility] Office# [phone] Pager# [phone]   05/14/2021, 11:40 AM

## 2021-05-14 NOTE — Consult Note (Addendum)
Cardiology Consultation:   Patient ID: Scott Willis MRN: [id number]; DOB: [birth year]  Admit date: 05/13/2021 Date of Consult: 05/14/2021  PCP:  Eunice Blase, MD   [place] Prov[facility] Cardiologist:  Buford Dresser, MD    Patient Profile:   Scott Willis is a 81 y.o. male with a hx of HTN, DM, smoker, chronic back pain, HLD, blind L eye, peripheral vision only in his left, unclear mention of possible hx of AF who is being seen 05/14/2021 for the evaluation of symptomatic pauses at the request of Gardiner Rhyme.  History of Present Illness:   Mr. Essick last saw Dr. Harrell Gave via tele health June 2021, at that time, the patient felt fine, had clear boundaries, was not agreeable to meds for BP, still smoking with no plans to quit, and managing his DM via Hgb A1c checks, rather then daily/home glucose monitoring.  Reported statin intolerances and not interested in alternative agents for lipid management. In regards to the reports of prior AFib (though not note by her personally), apparently previously declined monitoring and a/c.  He was admitted to [facility] yesterday with acute onset of intractable N/V, in the ER found to have long pauses up to 7 seconds, associated with lightheadedness, suspect to be vagally driven, though the pt did report even prior to this he would sometimes have fleeting dizzy spells. Not on any cardiac meds, nodal blocking agents, recommended to transfer to [facility] where EP consult could be obtained. Head CT in the ER noted an infarct though unable to tell timing> MRI noted remote infarctm, nothing acute.  He was given zofran apparently without help though after a dose of compizine, his N/V setlled and has remained resolved to today on a PPI Despite that he continues to have pauses on telemetry that he feels lightheaded with In d/w Dr. Su Grand though not hemodynamically unstable, though suspect he will need pacing, reporting pauses 4 - near 6 seconds despite no  GI complaints. In his d/w IM, do not suspect anything other then a viral gastroenteritis TTE noted LVEF 60-65%, no WMA, no significant VHD  LABS K+ 3.9, 4.0 Mag 2.0 BUN/Creat 12/1.50 HS Trop 3, 4 WBC 8.9 H/H 13/39 Plts 184  Hgb A1c 6.1  He reports that Monday evening ,seemed out of the blue he got very nauseous, started vomiting, this persisted and he went to the ER. While there would feel intermittently lightheaded a couple times perhaps near fainting but has not had syncope. He has not had vomiting since yesterday, but has had some intermittent brief lightheaded spells.  He denies CP,  though when he was vomiting/retching he had  R sided chest pain, sometimes even with belching.  No sense of palpitations  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    Ulcer    gastric    Past Surgical History:  Procedure Laterality Date   BACK SURGERY  1998   Dr.Nitka   BACK SURGERY  03/2006   Removed [place]s Dr.Nitka    CARPAL TUNNEL RELEASE  08/2006   CARPAL  TUNNEL RELEASE  08/2006   [place]    CATARACT EXTRACTION  02/21/2007   Dr.Epps    EYE SURGERY  2013   Right eye every 8 weeks    KIDNEY STONE SURGERY  08/11/2007   Dr.Tannenbaum    LITHOTRIPSY  08/11/2007   Dr.Nesi   REFRACTIVE SURGERY  09/2005   Macular Degeneration Dr.Mathews    SHOULDER ARTHROSCOPY WITH SUBACROMIAL DECOMPRESSION, ROTATOR CUFF REPAIR AND BICEP TENDON REPAIR Left 08/22/2013   Procedure:  LEFT SHOULDER DIAGNOSTIC OPERATIVE ARTHROSCOPY ,EXTENSIVE DEBRIDEMENT, BICEPS RELEASE AND TENODESIS, ROTATOR CUFF REPAIR, SUBACROMIAL DECOMPRESSION;  Surgeon:  Meredith Pel, MD;  Location: [place];  Service: Orthopedics;  Laterality: Left;  LEFT SHOULDER DOA,EXTENSIVE DEBRIDEMENT, BICEPS RELEASE AND TENODESIS, ROTATOR CUFF REPAIR, SUBACROMIAL DECOMPRESSION,     Home Medications:  Prior to Admission medications   Medication Sig Start Date End Date Taking? Authorizing Provider  Cholecalciferol (VITAMIN D-3) 125 MCG (5000 UT) TABS Take 5,000 Units by mouth daily.   Yes [provider]  diazepam (VALIUM) 5 MG tablet Take 5 mg by mouth See admin instructions. Take 5 mg by mouth as directed before right eye injection every 10 weeks   Yes [provider]  diclofenac sodium (VOLTAREN) 1 % GEL Apply 4 g topically 4 (four) times daily as needed. Patient taking differently: Apply 4 g topically 4 (four) times daily as needed (for pain). 09/15/18  Yes Hilts, Legrand Como, MD  finasteride (PROSCAR) 5 MG tablet TAKE 1 TABLET BY MOUTH EVERY DAY Patient taking differently: Take 5 mg by mouth daily. 09/21/19  Yes Hilts, Legrand Como, MD  JANUVIA 100 MG tablet TAKE 1 TABLET BY MOUTH EVERY DAY Patient taking differently: Take 100 mg by mouth daily. 08/21/20  Yes Hilts, Legrand Como, MD  Magnesium Oxide 400 (240 Mg) MG TABS TAKE 1 TABLET BY MOUTH EVERY DAY Patient taking differently: Take 400 mg by mouth daily. 02/04/20  Yes Hilts, Legrand Como, MD  Multiple Vitamins-Minerals (PRESERVISION AREDS 2 PO) Take 1 capsule by mouth daily.   Yes [provider]  mupirocin ointment (BACTROBAN) 2 % Apply 1 application topically 2 (two) times daily. Patient taking differently: Apply 1 application topically See admin instructions. Apply to the right hip once a day for wound care 11/04/20  Yes Lavonna Monarch, MD  oxyCODONE-acetaminophen (PERCOCET/ROXICET) 5-325 MG tablet Take 1-2 tablets by mouth every 6 (six) hours as needed for severe pain. Patient taking differently: Take 1 tablet by mouth See admin instructions. Take 1 tablet by mouth as directed before right eye injection  every 10 weeks 05/24/19  Yes Hilts, Legrand Como, MD  SYSTANE COMPLETE 0.6 % SOLN Place 1 drop into both eyes 3 (three) times daily as needed (for irritation).   Yes [provider]  tamsulosin (FLOMAX) 0.4 MG CAPS capsule TAKE 1 CAPSULE BY MOUTH EVERY DAY Patient taking differently: Take 0.4 mg by mouth daily. 09/21/19  Yes Hilts, Legrand Como, MD  trimethoprim-polymyxin b (POLYTRIM) ophthalmic solution Place 1 drop into the right eye See admin instructions. Instill 1 drop into the right eye four times a day for 2 days after eye injection every 10 weeks   Yes [provider]  blood glucose meter kit and supplies KIT Check CBG QID 04/20/18   Hilts, Legrand Como, MD  polyethylene glycol (GOLYTELY) 236 g solution Use once daily until bowels are moving normally. Patient not taking: Reported on 05/13/2021 08/20/20   Eunice Blase, MD  Prodigy Twist Top Lancets 28G MISC  02/14/19   [provider]  venlafaxine [facility])  75 MG tablet Take 1 tablet (75 mg total) by mouth 2 (two) times daily. Patient not taking: Reported on 05/13/2021 06/20/19   Hilts, Legrand Como, MD    Inpatient Medications: Scheduled Meds:  finasteride  5 mg Oral Daily   magnesium oxide  400 mg Oral Daily   pantoprazole (PROTONIX) IV  40 mg Intravenous Q24H   Vitamin D-3  5,000 Units Oral Daily   Continuous Infusions:  lactated ringers 100 mL/hr at 05/14/21 0501   PRN Meds: acetaminophen **OR** acetaminophen, diclofenac sodium, LORazepam, prochlorperazine, Propylene Glycol  Allergies:    Allergies  Allergen Reactions   Bee Pollen Anaphylaxis   Morphine And Related Other (See Comments)    "Kidney failure"   Tape Rash and Other (See Comments)    Any medical tape = rashes   Metformin Diarrhea and Other (See Comments)    Severe Diarrhea    Metformin And Related Diarrhea and Other (See Comments)    Severe Diarrhea    Morphine Other (See Comments)    "Shut down my kidneys"   Statins Other (See Comments)    Loss of  appetite   Fluorescein-Benoxinate Rash   Iodinated Contrast Media Rash    Social History:   Social History   Socioeconomic History   Marital status: Single    Spouse name: Not on file   Number of children: Not on file   Years of education: Not on file   Highest education level: Not on file  Occupational History    Employer: [employer]    Comment: owner of printing company--[address]  Tobacco Use   Smoking status: Every Day    Packs/day: 1.50    Years: 59.00    Pack years: 88.50    Types: Cigarettes   Smokeless tobacco: Never  Substance and Sexual Activity   Alcohol use: No    Alcohol/week: 0.0 standard drinks   Drug use: No   Sexual activity: Not on file  Other Topics Concern   Not on file  Social History Narrative   Not on file   Social Determinants of Health   Financial Resource Strain: Not on file  Food Insecurity: Not on file  Transportation Needs: Not on file  Physical Activity: Not on file  Stress: Not on file  Social Connections: Not on file  Intimate Partner Violence: Not on file    Family History:   Family History  Problem Relation Age of Onset   Alzheimer's disease Mother    Colon cancer Father    Diabetes Sister      ROS:  Please see the history of present illness.  All other ROS reviewed and negative.     Physical Exam/Data:   Vitals:   05/14/21 1100 05/14/21 1200 05/14/21 1230 05/14/21 1330  BP: (!) 155/76 (!) 162/83 (!) 176/75 (!) 163/79  Pulse: 79 75 79 85  Resp: 20 20 (!) 21 (!) 25  Temp:      TempSrc:      SpO2: 95% 95% 97% 95%  Weight:      Height:        Intake/Output Summary (Last 24 hours) at 05/14/2021 1347 Last data filed at 05/14/2021 0356 Gross per 24 hour  Intake --  Output 250 ml  Net -250 ml   Last 3 Weights 05/13/2021 11/19/2020 08/20/2020  Weight (lbs) 155 lb 145 lb 6.4 oz 145 lb 6.4 oz  Weight (kg) 70.308 kg 65.953 kg 65.953 kg     Body mass index is 21.93 kg/m.  General:  Well nourished, well  developed, in no acute distress HEENT: normal Neck: no JVD Vascular: No carotid bruits; Distal pulses 2+ bilaterally Cardiac:  RRR; no murmurs, gallops or rubs Lungs:  CTA b/l, no wheezing, rhonchi or rales  Abd: soft, nontender  Ext: no edema Musculoskeletal:  No deformities, BUE and BLE strength normal and equal Skin: warm and dry  Neuro:  CNs 2-12 intact, no focal abnormalities noted Psych:  Normal affect   EKG:  The EKG was personally reviewed and demonstrates:    #1 appears an accelerated junctional, 92bpm  vs SR w/long 1st degree block, narrow QRS 71m  #2 SR, long 1st degree AVblock , sinus arrhythmia, ?Mobitz one, 87bpm #3 SR 71bpm, Mobitz one  Telemetry:  Telemetry was personally reviewed and demonstrates:   SR 7-'s, he has had a couple short brady episodes here One without clear PR prolongation of  1.8seconds, a longer episodes has both P-P and PR prolongation, 3.8 seconds, followed by some shorter pauses/bradycardia  Relevant CV Studies:  Echo 05/13/20:  1. Left ventricular ejection fraction, by estimation, is 60 to 65%. The  left ventricle has normal function. The left ventricle has no regional  wall motion abnormalities. Left ventricular diastolic parameters are  consistent with Grade I diastolic  dysfunction (impaired relaxation).   2. Right ventricular systolic function is normal. The right ventricular  size is normal. There is normal pulmonary artery systolic pressure.   3. The mitral valve is normal in structure. Mild mitral valve  regurgitation. No evidence of mitral stenosis.   4. The aortic valve is normal in structure. Aortic valve regurgitation is  trivial. No aortic stenosis is present.   5. The inferior vena cava is normal in size with greater than 50%  respiratory variability, suggesting right atrial pressure of 3 mmHg.   Laboratory Data:  High Sensitivity Troponin:   Recent Labs  Lab 05/13/21 0414 05/13/21 0627  TROPONINIHS 3 4      Chemistry Recent Labs  Lab 05/13/21 0414 05/13/21 0432 05/13/21 0627  NA 136 139  --   K 3.9 4.0  --   CL 103 104  --   CO2 25  --   --   GLUCOSE 216* 220*  --   BUN 15 12  --   CREATININE 1.31* 1.50*  --   CALCIUM 9.4  --   --   MG  --   --  2.0  GFRNONAA 55*  --   --   ANIONGAP 8  --   --     Recent Labs  Lab 05/13/21 0414  PROT 6.8  ALBUMIN 3.6  AST 19  ALT 13  ALKPHOS 48  BILITOT 0.9   Lipids  Recent Labs  Lab 05/14/21 0500  CHOL 193  TRIG 76  HDL 46  LDLCALC 132*  CHOLHDL 4.2    Hematology Recent Labs  Lab 05/13/21 0414 05/13/21 0432  WBC 8.9  --   RBC 4.21*  --   HGB 12.8* 13.3  HCT 39.1 39.0  MCV 92.9  --   MCH 30.4  --   MCHC 32.7  --   RDW 13.4  --   PLT 184  --    Thyroid No results for input(s): TSH, FREET4 in the last 168 hours.  BNPNo results for input(s): BNP, PROBNP in the last 168 hours.  DDimer No results for input(s): DDIMER in the last 168 hours.   Radiology/Studies:  CT HEAD WO CONTRAST Result Date: 05/13/2021  CLINICAL DATA:  Dizziness, non-specific EXAM: CT HEAD WITHOUT CONTRAST TECHNIQUE: Contiguous axial images were obtained from the base of the skull through the vertex without intravenous contrast. RADIATION DOSE REDUCTION: This exam was performed according to the departmental dose-optimization program which includes automated exposure control, adjustment of the mA and/or kV according to patient size and/or use of iterative reconstruction technique. COMPARISON:  CT head 04/23/2015. FINDINGS: Brain: New hypodensity in the left aspect of pons (for example: Series 5, image 26; series 2 image 9; series 4, images 37/38.). No evidence of acute hemorrhage, hydrocephalus, mass lesion, midline shift or extra-axial hemorrhage. Cerebral atrophy. Patchy white matter hypoattenuation, nonspecific but compatible with chronic microvascular disease. Vascular: No hyperdense vessel identified. Calcific intracranial atherosclerosis. Skull: No acute  fracture. Sinuses/Orbits: Clear sinuses.  Unremarkable orbits. Other: No mastoid effusions. IMPRESSION: Hypodensity in the left aspect of the pons, suspicious for infarct that is new since 2017 but otherwise age indeterminate by CT. MRI could further evaluate for acute infarct. Electronically Signed   By: Margaretha Sheffield M.D.   On: 05/13/2021 05:19   MR BRAIN WO CONTRAST Result Date: 05/13/2021 CLINICAL DATA:  Nausea, multiple episodes of emesis, possible stroke EXAM: MRI HEAD WITHOUT CONTRAST TECHNIQUE: Multiplanar, multiecho pulse sequences of the brain and surrounding structures were obtained without intravenous contrast. COMPARISON:  CT head obtained earlier the same day FINDINGS: Brain: There is no evidence of acute intracranial hemorrhage, extra-axial fluid collection, or acute infarct. There is a small remote infarct in the left pons corresponding to the finding on the same-day head CT. There is a background of mild global parenchymal volume loss with prominence of the ventricular system and extra-axial CSF spaces. Patchy FLAIR signal abnormality throughout the subcortical and periventricular white matter is nonspecific but likely reflects sequela of moderate chronic white matter microangiopathy. There is no mass lesion.  There is no midline shift. Vascular: Normal flow voids. Skull and upper cervical spine: Normal marrow signal. Sinuses/Orbits: The paranasal sinuses are clear. Bilateral lens implants are in place. The globes and orbits are otherwise unremarkable. Other: None. IMPRESSION: 1. No acute intracranial pathology. 2. Small remote infarct in the left pons corresponding to the finding on the CT. 3. Moderate chronic white matter microangiopathy. Electronically Signed   By: Valetta Mole M.D.   On: 05/13/2021 12:22   DG CHEST PORT 1 VIEW Result Date: 05/13/2021 CLINICAL DATA:  81 year old male with vomiting. EXAM: PORTABLE CHEST 1 VIEW COMPARISON:  Chest radiographs 04/23/2015 and earlier.  FINDINGS: Portable AP semi upright view at 0433 hours. Stable lung volumes. Mediastinal contours remain normal. Visualized tracheal air column is within normal limits. No pneumothorax or pulmonary edema. Lung base ventilation appears stable since 2017, no consolidation or confluent opacity. No acute osseous abnormality identified. Paucity of bowel gas in the upper abdomen. IMPRESSION: No acute cardiopulmonary abnormality. Electronically Signed   By: Genevie Ann M.D.   On: 05/13/2021 04:44   VAS US CAROTID (at [facility] and [facility] only) Result Date: 05/13/2021 Carotid Arterial Duplex Study Patient Name:  KAYSHAWN OZBURN  Date of Exam:   05/13/2021 Medical Rec #: [id number]           Accession #:    [id number] Date of Birth: [birth year]            Patient Gender: M Patient Age:   66 years Exam Location:  [facility] Procedure:      VAS US CAROTID Referring Phys: Shanon Brow ORTIZ --------------------------------------------------------------------------------  Indications:  CVA. Risk Factors:      Hypertension, Diabetes. Limitations        Today's exam was limited due to the patient's respiratory                    variation and Intractable nausea and vomiting. Comparison Study:  No prior studies. Performing Technologist: Oliver Hum RVT  Examination Guidelines: A complete evaluation includes B-mode imaging, spectral Doppler, color Doppler, and power Doppler as needed of all accessible portions of each vessel. Bilateral testing is considered an integral part of a complete examination. Limited examinations for reoccurring indications may be performed as noted.  Right Carotid Findings:Summary: Right Carotid: Velocities in the right ICA are consistent with a 1-39% stenosis. Left Carotid: Velocities in the left ICA are consistent with a 1-39% stenosis. Vertebrals: Bilateral vertebral arteries demonstrate antegrade flow. *See table(s) above for measurements and observations.  Electronically signed by Deitra Mayo MD  on 05/13/2021 at 1:09:44 PM.    Final      Assessment and Plan:   Symptomatic bradycardia Monitoring from yesterday was reviewed as well as so far here. He has had pauses as long as 7 seconds (while nauseous) with both P-P and PR prolongation > CHB that is c/w vagally mediated. With improvement in his N/V this is improved but not resolved, with continued intermittent brady, some with P-P and PR prolongation and a couple shorter without.  I think he Umeka Wrench need PPM He is NPO EP MD Ceasar Decandia see him, if agrees Rylah Fukuda implant as son as we can.  His BP is stable (high), he has not had full syncope.     Risk Assessment/Risk Scores:    For questions or updates, please contact [place] Please consult [email or web address] for contact info under    Signed, Baldwin Jamaica, PA-C  05/14/2021 1:47 PM  I have seen and examined this patient with Tommye Standard.  Agree with above, note added to reflect my findings.  Patient presented with naueas and vomiting. Had bradycardia with heart block. Appears vagal but no trigger now that no longer vomiting.  GEN: Well nourished, well developed, in no acute distress  HEENT: normal  Neck: no JVD, carotid bruits, or masses Cardiac: RRR; no murmurs, rubs, or gallops,no edema  Respiratory:  clear to auscultation bilaterally, normal work of breathing GI: soft, nontender, nondistended, + BS MS: no deformity or atrophy  Skin: warm and dry Neuro:  Strength and sensation are intact Psych: euthymic mood, full affect   Heart block with vagal tone: no reversible causes. Adryanna Friedt plan for pacemaker implant tomorrow. Patient understands and is agreeable.   Arayla Kruschke M. Tyrisha Benninger MD 05/14/2021 4:57 PM

## 2021-05-14 NOTE — Progress Notes (Addendum)
Progress Note  Patient Name: Scott Willis Date of Encounter: 05/14/2021  [facility] Cardiologist: Buford Dresser, MD   Subjective   Reports intermittent lightheadedness.  Nausea resolved  Inpatient Medications    Scheduled Meds:  finasteride  5 mg Oral Daily   magnesium oxide  400 mg Oral Daily   pantoprazole (PROTONIX) IV  40 mg Intravenous Q24H   Vitamin D-3  5,000 Units Oral Daily   Continuous Infusions:  lactated ringers 100 mL/hr at 05/14/21 0501   PRN Meds: acetaminophen **OR** acetaminophen, diclofenac sodium, LORazepam, prochlorperazine, Propylene Glycol   Vital Signs    Vitals:   05/14/21 0600 05/14/21 0630 05/14/21 0700 05/14/21 0900  BP: (!) 147/71 (!) 154/78 (!) 157/61 (!) 157/97  Pulse: 76 74 60 67  Resp: 17 20 13 19   Temp:      TempSrc:      SpO2: 93% 94% 96% 96%  Weight:      Height:        Intake/Output Summary (Last 24 hours) at 05/14/2021 1035 Last data filed at 05/14/2021 0356 Gross per 24 hour  Intake --  Output 250 ml  Net -250 ml   Last 3 Weights 05/13/2021 11/19/2020 08/20/2020  Weight (lbs) 155 lb 145 lb 6.4 oz 145 lb 6.4 oz  Weight (kg) 70.308 kg 65.953 kg 65.953 kg      Telemetry    Wenckebach, pauses up to 5.7 seconds overnight- Personally Reviewed  ECG    No new ECG - Personally Reviewed  Physical Exam   GEN: No acute distress.   Neck: No JVD Cardiac: RRR, no murmurs, rubs, or gallops.  Respiratory: Clear to auscultation bilaterally. GI: Soft, nontender, non-distended  MS: No edema; No deformity. Neuro:  Nonfocal  Psych: Normal affect   Labs    High Sensitivity Troponin:   Recent Labs  Lab 05/13/21 0414 05/13/21 0627  TROPONINIHS 3 4     Chemistry Recent Labs  Lab 05/13/21 0414 05/13/21 0432 05/13/21 0627  NA 136 139  --   K 3.9 4.0  --   CL 103 104  --   CO2 25  --   --   GLUCOSE 216* 220*  --   BUN 15 12  --   CREATININE 1.31* 1.50*  --   CALCIUM 9.4  --   --   MG  --   --  2.0  PROT  6.8  --   --   ALBUMIN 3.6  --   --   AST 19  --   --   ALT 13  --   --   ALKPHOS 48  --   --   BILITOT 0.9  --   --   GFRNONAA 55*  --   --   ANIONGAP 8  --   --     Lipids  Recent Labs  Lab 05/14/21 0500  CHOL 193  TRIG 76  HDL 46  LDLCALC 132*  CHOLHDL 4.2    Hematology Recent Labs  Lab 05/13/21 0414 05/13/21 0432  WBC 8.9  --   RBC 4.21*  --   HGB 12.8* 13.3  HCT 39.1 39.0  MCV 92.9  --   MCH 30.4  --   MCHC 32.7  --   RDW 13.4  --   PLT 184  --    Thyroid No results for input(s): TSH, FREET4 in the last 168 hours.  BNPNo results for input(s): BNP, PROBNP in the last 168 hours.  DDimer  No results for input(s): DDIMER in the last 168 hours.   Radiology    CT HEAD WO CONTRAST  Result Date: 05/13/2021 CLINICAL DATA:  Dizziness, non-specific EXAM: CT HEAD WITHOUT CONTRAST TECHNIQUE: Contiguous axial images were obtained from the base of the skull through the vertex without intravenous contrast. RADIATION DOSE REDUCTION: This exam was performed according to the departmental dose-optimization program which includes automated exposure control, adjustment of the mA and/or kV according to patient size and/or use of iterative reconstruction technique. COMPARISON:  CT head 04/23/2015. FINDINGS: Brain: New hypodensity in the left aspect of pons (for example: Series 5, image 26; series 2 image 9; series 4, images 37/38.). No evidence of acute hemorrhage, hydrocephalus, mass lesion, midline shift or extra-axial hemorrhage. Cerebral atrophy. Patchy white matter hypoattenuation, nonspecific but compatible with chronic microvascular disease. Vascular: No hyperdense vessel identified. Calcific intracranial atherosclerosis. Skull: No acute fracture. Sinuses/Orbits: Clear sinuses.  Unremarkable orbits. Other: No mastoid effusions. IMPRESSION: Hypodensity in the left aspect of the pons, suspicious for infarct that is new since 2017 but otherwise age indeterminate by CT. MRI could further  evaluate for acute infarct. Electronically Signed   By: Margaretha Sheffield M.D.   On: 05/13/2021 05:19   MR BRAIN WO CONTRAST  Result Date: 05/13/2021 CLINICAL DATA:  Nausea, multiple episodes of emesis, possible stroke EXAM: MRI HEAD WITHOUT CONTRAST TECHNIQUE: Multiplanar, multiecho pulse sequences of the brain and surrounding structures were obtained without intravenous contrast. COMPARISON:  CT head obtained earlier the same day FINDINGS: Brain: There is no evidence of acute intracranial hemorrhage, extra-axial fluid collection, or acute infarct. There is a small remote infarct in the left pons corresponding to the finding on the same-day head CT. There is a background of mild global parenchymal volume loss with prominence of the ventricular system and extra-axial CSF spaces. Patchy FLAIR signal abnormality throughout the subcortical and periventricular white matter is nonspecific but likely reflects sequela of moderate chronic white matter microangiopathy. There is no mass lesion.  There is no midline shift. Vascular: Normal flow voids. Skull and upper cervical spine: Normal marrow signal. Sinuses/Orbits: The paranasal sinuses are clear. Bilateral lens implants are in place. The globes and orbits are otherwise unremarkable. Other: None. IMPRESSION: 1. No acute intracranial pathology. 2. Small remote infarct in the left pons corresponding to the finding on the CT. 3. Moderate chronic white matter microangiopathy. Electronically Signed   By: Valetta Mole M.D.   On: 05/13/2021 12:22   DG CHEST PORT 1 VIEW  Result Date: 05/13/2021 CLINICAL DATA:  81 year old male with vomiting. EXAM: PORTABLE CHEST 1 VIEW COMPARISON:  Chest radiographs 04/23/2015 and earlier. FINDINGS: Portable AP semi upright view at 0433 hours. Stable lung volumes. Mediastinal contours remain normal. Visualized tracheal air column is within normal limits. No pneumothorax or pulmonary edema. Lung base ventilation appears stable since 2017,  no consolidation or confluent opacity. No acute osseous abnormality identified. Paucity of bowel gas in the upper abdomen. IMPRESSION: No acute cardiopulmonary abnormality. Electronically Signed   By: Genevie Ann M.D.   On: 05/13/2021 04:44   ECHOCARDIOGRAM COMPLETE  Result Date: 05/13/2021    ECHOCARDIOGRAM REPORT   Patient Name:   DOAK MAH Date of Exam: 05/13/2021 Medical Rec #:  [id number]          Height:       70.5 in Accession #:    [id number]         Weight:       155.0 lb Date of Birth:  Feb 20, 1941           BSA:          1.883 m Patient Age:    44 years           BP:           139/65 mmHg Patient Gender: M                  HR:           87 bpm. Exam Location:  Inpatient Procedure: 2D Echo Indications:    Stroke  History:        Patient has prior history of Echocardiogram examinations, most                 recent 06/24/2015. Risk Factors:Hypertension, Diabetes and                 Dyslipidemia.  Sonographer:    Arlyss Gandy Referring Phys: [id number] DAVID MANUEL [place]  1. Left ventricular ejection fraction, by estimation, is 60 to 65%. The left ventricle has normal function. The left ventricle has no regional wall motion abnormalities. Left ventricular diastolic parameters are consistent with Grade I diastolic dysfunction (impaired relaxation).  2. Right ventricular systolic function is normal. The right ventricular size is normal. There is normal pulmonary artery systolic pressure.  3. The mitral valve is normal in structure. Mild mitral valve regurgitation. No evidence of mitral stenosis.  4. The aortic valve is normal in structure. Aortic valve regurgitation is trivial. No aortic stenosis is present.  5. The inferior vena cava is normal in size with greater than 50% respiratory variability, suggesting right atrial pressure of 3 mmHg. Conclusion(s)/Recommendation(s): No intracardiac source of embolism detected on this transthoracic study. Consider a transesophageal echocardiogram to exclude  cardiac source of embolism if clinically indicated. FINDINGS  Left Ventricle: Left ventricular ejection fraction, by estimation, is 60 to 65%. The left ventricle has normal function. The left ventricle has no regional wall motion abnormalities. The left ventricular internal cavity size was normal in size. There is  no left ventricular hypertrophy. Left ventricular diastolic parameters are consistent with Grade I diastolic dysfunction (impaired relaxation). Right Ventricle: The right ventricular size is normal. No increase in right ventricular wall thickness. Right ventricular systolic function is normal. There is normal pulmonary artery systolic pressure. The tricuspid regurgitant velocity is 2.28 m/s, and  with an assumed right atrial pressure of 3 mmHg, the estimated right ventricular systolic pressure is 23.5 mmHg. Left Atrium: Left atrial size was normal in size. Right Atrium: Right atrial size was normal in size. Pericardium: There is no evidence of pericardial effusion. Mitral Valve: The mitral valve is normal in structure. Mild mitral valve regurgitation. No evidence of mitral valve stenosis. Tricuspid Valve: The tricuspid valve is normal in structure. Tricuspid valve regurgitation is not demonstrated. No evidence of tricuspid stenosis. Aortic Valve: The aortic valve is normal in structure. Aortic valve regurgitation is trivial. No aortic stenosis is present. Aortic valve mean gradient measures 4.0 mmHg. Aortic valve peak gradient measures 7.8 mmHg. Aortic valve area, by VTI measures 1.90 cm. Pulmonic Valve: The pulmonic valve was normal in structure. Pulmonic valve regurgitation is not visualized. No evidence of pulmonic stenosis. Aorta: The aortic root is normal in size and structure. Venous: The inferior vena cava is normal in size with greater than 50% respiratory variability, suggesting right atrial pressure of 3 mmHg. IAS/Shunts: No atrial level shunt detected by color flow Doppler.  LEFT VENTRICLE  PLAX 2D LVIDd:         4.30 cm   Diastology LVIDs:         3.00 cm   LV e' medial:    14.10 cm/s LV PW:         1.10 cm   LV E/e' medial:  5.0 LV IVS:        1.10 cm   LV e' lateral:   7.07 cm/s LVOT diam:     2.00 cm   LV E/e' lateral: 10.1 LV SV:         62 LV SV Index:   33 LVOT Area:     3.14 cm  RIGHT VENTRICLE RV Basal diam:  3.10 cm RV Mid diam:    2.70 cm RV S prime:     14.60 cm/s TAPSE (M-mode): 1.9 cm LEFT ATRIUM             Index        RIGHT ATRIUM           Index LA diam:        3.80 cm 2.02 cm/m   RA Area:     12.80 cm LA Vol (A2C):   26.9 ml 14.29 ml/m  RA Volume:   25.70 ml  13.65 ml/m LA Vol (A4C):   49.2 ml 26.13 ml/m LA Biplane Vol: 37.2 ml 19.76 ml/m  AORTIC VALVE AV Area (Vmax):    1.94 cm AV Area (Vmean):   2.03 cm AV Area (VTI):     1.90 cm AV Vmax:           140.00 cm/s AV Vmean:          89.400 cm/s AV VTI:            0.326 m AV Peak Grad:      7.8 mmHg AV Mean Grad:      4.0 mmHg LVOT Vmax:         86.50 cm/s LVOT Vmean:        57.900 cm/s LVOT VTI:          0.197 m LVOT/AV VTI ratio: 0.60  AORTA Ao Root diam: 3.10 cm Ao Asc diam:  3.10 cm MITRAL VALVE               TRICUSPID VALVE MV Area (PHT): 2.39 cm    TR Peak grad:   20.8 mmHg MV Decel Time: 317 msec    TR Vmax:        228.00 cm/s MV E velocity: 71.10 cm/s MV A velocity: 84.00 cm/s  SHUNTS MV E/A ratio:  0.85        Systemic VTI:  0.20 m                            Systemic Diam: 2.00 cm Candee Furbish MD Electronically signed by Candee Furbish MD Signature Date/Time: 05/13/2021/12:33:49 PM    Final    VAS US CAROTID (at [facility] and [facility] only)  Result Date: 05/13/2021 Carotid Arterial Duplex Study Patient Name:  LYALL FACIANE  Date of Exam:   05/13/2021 Medical Rec #: [id number]           Accession #:    [id number] Date of Birth: 1941/01/09            Patient Gender: M Patient Age:   26 years Exam Location:  [facility] Procedure:      VAS US CAROTID  Referring Phys: DAVID ORTIZ  --------------------------------------------------------------------------------  Indications:       CVA. Risk Factors:      Hypertension, Diabetes. Limitations        Today's exam was limited due to the patient's respiratory                    variation and Intractable nausea and vomiting. Comparison Study:  No prior studies. Performing Technologist: Oliver Hum RVT  Examination Guidelines: A complete evaluation includes B-mode imaging, spectral Doppler, color Doppler, and power Doppler as needed of all accessible portions of each vessel. Bilateral testing is considered an integral part of a complete examination. Limited examinations for reoccurring indications may be performed as noted.  Right Carotid Findings: +----------+--------+-------+--------+--------------------------------+--------+             PSV cm/s EDV     Stenosis Plaque Description               Comments                       cm/s                                                        +----------+--------+-------+--------+--------------------------------+--------+  CCA Prox   115      10               smooth and heterogenous                    +----------+--------+-------+--------+--------------------------------+--------+  CCA Distal 99       14               smooth and heterogenous                    +----------+--------+-------+--------+--------------------------------+--------+  ICA Prox   109      15               calcific, smooth and                                                             heterogenous                               +----------+--------+-------+--------+--------------------------------+--------+  ICA Distal 65       14                                                          +----------+--------+-------+--------+--------------------------------+--------+  ECA        106      15                                                          +----------+--------+-------+--------+--------------------------------+--------+  +----------+--------+-------+--------+-------------------+  PSV cm/s EDV cms Describe Arm Pressure (mmHG)  +----------+--------+-------+--------+-------------------+  Subclavian 53                                             +----------+--------+-------+--------+-------------------+ +---------+--------+--+--------+-+  Vertebral PSV cm/s 35 EDV cm/s 7  +---------+--------+--+--------+-+  Left Carotid Findings: +----------+--------+-------+--------+--------------------------------+--------+             PSV cm/s EDV     Stenosis Plaque Description               Comments                       cm/s                                                        +----------+--------+-------+--------+--------------------------------+--------+  CCA Prox   99       20               smooth and heterogenous                    +----------+--------+-------+--------+--------------------------------+--------+  CCA Distal 117      12               smooth and heterogenous                    +----------+--------+-------+--------+--------------------------------+--------+  ICA Prox   214      29               calcific, smooth and                                                             heterogenous                               +----------+--------+-------+--------+--------------------------------+--------+  ICA Distal 111      14                                                tortuous  +----------+--------+-------+--------+--------------------------------+--------+  ECA        140      18                                                          +----------+--------+-------+--------+--------------------------------+--------+ +----------+--------+--------+--------+-------------------+             PSV cm/s EDV cm/s Describe Arm Pressure (mmHG)  +----------+--------+--------+--------+-------------------+  Subclavian 106                                             +----------+--------+--------+--------+-------------------+  +---------+--------+--+--------+-+---------+  Vertebral PSV cm/s 31 EDV cm/s 5 Antegrade  +---------+--------+--+--------+-+---------+   Summary: Right Carotid: Velocities in the right ICA are consistent with a 1-39% stenosis. Left Carotid: Velocities in the left ICA are consistent with a 1-39% stenosis. Vertebrals: Bilateral vertebral arteries demonstrate antegrade flow. *See table(s) above for measurements and observations.  Electronically signed by Deitra Mayo MD on 05/13/2021 at 1:09:44 PM.    Final     Cardiac Studies   Echo 05/13/20:  1. Left ventricular ejection fraction, by estimation, is 60 to 65%. The  left ventricle has normal function. The left ventricle has no regional  wall motion abnormalities. Left ventricular diastolic parameters are  consistent with Grade I diastolic  dysfunction (impaired relaxation).   2. Right ventricular systolic function is normal. The right ventricular  size is normal. There is normal pulmonary artery systolic pressure.   3. The mitral valve is normal in structure. Mild mitral valve  regurgitation. No evidence of mitral stenosis.   4. The aortic valve is normal in structure. Aortic valve regurgitation is  trivial. No aortic stenosis is present.   5. The inferior vena cava is normal in size with greater than 50%  respiratory variability, suggesting right atrial pressure of 3 mmHg.   Patient Profile     81 y.o. male with a hx of DM2, HTN, HLD, ?PAF, tob use, chronic back pain, PUD, BPH, who is being seen 05/13/2021 for the evaluation of sinus pauses  Assessment & Plan    Sinus pauses: up to 7.3 second pause on 1/31, occurred in setting of nausea/vomiting.  Vagal response suspected.  However he has continued to have pauses with resolution of his nausea/vomiting.  Appears symptomatic, reports lightheadedness during episodes.  Pauses overnight and this morning up to 5.7 seconds.  Echocardiogram 1/31 shows normal biventricular function, no significant  valvular disease -EP consulted for PPM.  I discussed with EP, they will see him when he gets to [facility] (should be soon per bed center) -Avoid AV nodal blocking agents  Nausea and vomiting: Resolved   CVA: Brain MRI shows remote infarct.  No acute CVA.  There is mention of A-fib in the past per patient report, but he previously declined anticoagulation or monitor.  Would plan 30-day monitor if patient agreeable  For questions or updates, please contact [place] Please consult [email or web address] for contact info under        Signed, Donato Heinz, MD  05/14/2021, 10:35 AM

## 2021-05-14 NOTE — ED Notes (Signed)
Pt noticed to be in [place] type 2 block on monitor. EKG captured. MD paged

## 2021-05-14 NOTE — ED Notes (Signed)
Pt noted to have an increase in cardiac pauses last 3-6 seconds. During these episodes pt has been laying in bed or speaking to staff. Denies symptoms during pauses. Pt is not have N/V during episodes. Providers paged with update

## 2021-05-15 ENCOUNTER — Encounter (HOSPITAL_COMMUNITY)
Admission: EM | Disposition: A | Discharge status: Home / Self Care | Payer: Self-pay | Source: Home / Self Care | Attending: Internal Medicine

## 2021-05-15 DIAGNOSIS — I10 Essential (primary) hypertension: Secondary | ICD-10-CM | POA: Diagnosis not present

## 2021-05-15 DIAGNOSIS — E43 Unspecified severe protein-calorie malnutrition: Secondary | ICD-10-CM | POA: Diagnosis present

## 2021-05-15 DIAGNOSIS — I455 Other specified heart block: Secondary | ICD-10-CM | POA: Diagnosis not present

## 2021-05-15 DIAGNOSIS — R930 Abnormal findings on diagnostic imaging of skull and head, not elsewhere classified: Secondary | ICD-10-CM | POA: Diagnosis not present

## 2021-05-15 DIAGNOSIS — N400 Enlarged prostate without lower urinary tract symptoms: Secondary | ICD-10-CM | POA: Diagnosis not present

## 2021-05-15 LAB — CBC WITH DIFFERENTIAL/PLATELET
Abs Immature Granulocytes: 0.07 10*3/uL (ref 0.00–0.07)
Basophils Absolute: 0.1 10*3/uL (ref 0.0–0.1)
Basophils Relative: 0 %
Eosinophils Absolute: 0.1 10*3/uL (ref 0.0–0.5)
Eosinophils Relative: 0 %
HCT: 36.5 % — ABNORMAL LOW (ref 39.0–52.0)
Hemoglobin: 11.9 g/dL — ABNORMAL LOW (ref 13.0–17.0)
Immature Granulocytes: 1 %
Lymphocytes Relative: 17 %
Lymphs Abs: 2.3 10*3/uL (ref 0.7–4.0)
MCH: 30.1 pg (ref 26.0–34.0)
MCHC: 32.6 g/dL (ref 30.0–36.0)
MCV: 92.2 fL (ref 80.0–100.0)
Monocytes Absolute: 1.2 10*3/uL — ABNORMAL HIGH (ref 0.1–1.0)
Monocytes Relative: 9 %
Neutro Abs: 9.9 10*3/uL — ABNORMAL HIGH (ref 1.7–7.7)
Neutrophils Relative %: 73 %
Platelets: 165 10*3/uL (ref 150–400)
RBC: 3.96 MIL/uL — ABNORMAL LOW (ref 4.22–5.81)
RDW: 13.2 % (ref 11.5–15.5)
WBC: 13.6 10*3/uL — ABNORMAL HIGH (ref 4.0–10.5)
nRBC: 0 % (ref 0.0–0.2)

## 2021-05-15 LAB — MAGNESIUM: Magnesium: 1.6 mg/dL — ABNORMAL LOW (ref 1.7–2.4)

## 2021-05-15 LAB — BASIC METABOLIC PANEL
Anion gap: 10 (ref 5–15)
BUN: 18 mg/dL (ref 8–23)
CO2: 26 mmol/L (ref 22–32)
Calcium: 8.4 mg/dL — ABNORMAL LOW (ref 8.9–10.3)
Chloride: 103 mmol/L (ref 98–111)
Creatinine, Ser: 1.43 mg/dL — ABNORMAL HIGH (ref 0.61–1.24)
GFR, Estimated: 50 mL/min — ABNORMAL LOW (ref >60)
Glucose, Bld: 96 mg/dL (ref 70–99)
Potassium: 3.2 mmol/L — ABNORMAL LOW (ref 3.5–5.1)
Sodium: 139 mmol/L (ref 135–145)

## 2021-05-15 LAB — SURGICAL PCR SCREEN
MRSA, PCR: NEGATIVE
Staphylococcus aureus: NEGATIVE

## 2021-05-15 LAB — GLUCOSE, CAPILLARY: Glucose-Capillary: 87 mg/dL (ref 70–99)

## 2021-05-15 SURGERY — PACEMAKER IMPLANT

## 2021-05-15 MED ORDER — CEFAZOLIN SODIUM-DEXTROSE 2-4 GM/100ML-% IV SOLN
2.0000 g | INTRAVENOUS | Status: AC
  Filled 2021-05-15: qty 100

## 2021-05-15 MED ORDER — SODIUM CHLORIDE 0.9 % IV SOLN: 250.0000 mL | INTRAVENOUS | Status: DC

## 2021-05-15 MED ORDER — MAGNESIUM SULFATE 2 GM/50ML IV SOLN
2.0000 g | Freq: Once | INTRAVENOUS | Status: AC
  Administered 2021-05-15: 2 g via INTRAVENOUS
  Filled 2021-05-15: qty 50

## 2021-05-15 MED ORDER — METHYLPREDNISOLONE SODIUM SUCC 125 MG IJ SOLR: 125.0000 mg | Freq: Once | INTRAMUSCULAR | Status: DC

## 2021-05-15 MED ORDER — ADULT MULTIVITAMIN W/MINERALS CH: 1.0000 | ORAL_TABLET | Freq: Every day | ORAL | Status: DC

## 2021-05-15 MED ORDER — POTASSIUM CHLORIDE 10 MEQ/100ML IV SOLN
10.0000 meq | INTRAVENOUS | Status: AC
  Administered 2021-05-15 – 2021-05-16 (×4): 10 meq via INTRAVENOUS
  Filled 2021-05-15 (×4): qty 100

## 2021-05-15 MED ORDER — DIPHENHYDRAMINE HCL 50 MG/ML IJ SOLN: 25.0000 mg | Freq: Once | INTRAMUSCULAR | Status: DC

## 2021-05-15 MED ORDER — POTASSIUM CHLORIDE CRYS ER 20 MEQ PO TBCR
40.0000 meq | EXTENDED_RELEASE_TABLET | ORAL | Status: AC
  Administered 2021-05-15 (×2): 40 meq via ORAL
  Filled 2021-05-15 (×2): qty 2

## 2021-05-15 MED ORDER — CHLORHEXIDINE GLUCONATE 4 % EX LIQD
60.0000 mL | Freq: Once | CUTANEOUS | Status: AC
  Filled 2021-05-15: qty 60

## 2021-05-15 MED ORDER — SODIUM CHLORIDE 0.9 % IV SOLN: INTRAVENOUS | Status: DC

## 2021-05-15 MED ORDER — CHLORHEXIDINE GLUCONATE 4 % EX LIQD
60.0000 mL | Freq: Once | CUTANEOUS | Status: AC
  Administered 2021-05-15: 4 via TOPICAL
  Filled 2021-05-15 (×2): qty 60

## 2021-05-15 MED ORDER — PANTOPRAZOLE SODIUM 40 MG IV SOLR: 40.0000 mg | Freq: Two times a day (BID) | INTRAVENOUS | Status: DC

## 2021-05-15 MED ORDER — SODIUM CHLORIDE 0.9% FLUSH: 3.0000 mL | INTRAVENOUS | Status: DC | PRN

## 2021-05-15 MED ORDER — INSULIN ASPART 100 UNIT/ML IJ SOLN: 0.0000 [IU] | Freq: Three times a day (TID) | INTRAMUSCULAR | Status: DC

## 2021-05-15 MED ORDER — GLUCERNA SHAKE PO LIQD
237.0000 mL | Freq: Three times a day (TID) | ORAL | Status: DC
  Administered 2021-05-16 – 2021-05-20 (×3): 237 mL via ORAL
  Filled 2021-05-15: qty 237

## 2021-05-15 MED ORDER — SODIUM CHLORIDE 0.9 % IV SOLN
80.0000 mg | INTRAVENOUS | Status: AC
  Filled 2021-05-15: qty 2

## 2021-05-15 MED ORDER — PANTOPRAZOLE SODIUM 40 MG IV SOLR
40.0000 mg | Freq: Two times a day (BID) | INTRAVENOUS | Status: DC
  Administered 2021-05-15 – 2021-05-21 (×14): 40 mg via INTRAVENOUS
  Filled 2021-05-15 (×4): qty 40
  Filled 2021-05-15: qty 10
  Filled 2021-05-15: qty 40
  Filled 2021-05-15: qty 10
  Filled 2021-05-15 (×2): qty 40
  Filled 2021-05-15: qty 10
  Filled 2021-05-15: qty 40
  Filled 2021-05-15: qty 10
  Filled 2021-05-15 (×2): qty 40

## 2021-05-15 NOTE — Progress Notes (Signed)
PT Cancellation Note  Patient Details Name: Scott Willis MRN: [id number] DOB: 1940/11/17   Cancelled Treatment:    Reason Eval/Treat Not Completed: Patient not medically ready (pt currently with 4-7 sec pauses and awaiting PPM this afternoon. Will plan to see next date if medically stable)   [name] [name] 05/15/2021, 7:17 AM [place] Pager: [phone] Office: [phone]

## 2021-05-15 NOTE — Plan of Care (Signed)
  Problem: Skin Integrity: Goal: Risk for impaired skin integrity will decrease Outcome: Progressing   

## 2021-05-15 NOTE — Progress Notes (Signed)
Initial Nutrition Assessment  DOCUMENTATION CODES:  Severe malnutrition in context of social or environmental circumstances  INTERVENTION:  Advance diet to regular after pacemaker placement Glucerna Shake po TID, each supplement provides 220 kcal and 10 grams of protein pending diet advancement MVI with minerals daily  NUTRITION DIAGNOSIS:  Severe Malnutrition (in the context of social/environmental circumstances) related to  (inadequate oral intake) as evidenced by severe fat depletion, severe muscle depletion.  GOAL:  Patient will meet greater than or equal to 90% of their needs  MONITOR:  PO intake, Diet advancement, Supplement acceptance, Labs  REASON FOR ASSESSMENT:  Malnutrition Screening Tool    ASSESSMENT:  81 y.o. male with PMH of osteoarthritis, HTN, blindness, type II DM, HLD, nephrolithiasis, macular degeneration, osteoporosis gastric ulcer,presented to ED with complaints of persistent nausea and vomiting.  Workup revealed AV block and pausing  Pt resting in bed at the time of visit, reports he is awaiting pacemaker placement later this afternoon. Currently NPO for procedure, but pt reports he has remained very nauseated. Seems to correspond with periods of bradycardia (several episodes noted on monitor while in the room).   Significant muscle and fat depletions noted on exam. Pt reports he has always has a poor appetite and "only eats because he has to." Normally has 1 meal a day but does consume some snacks. States he eats a frozen meal of salisbury steak and mac and cheese daily and will eat popcorn and cheese balls as his snack. Drinks coffee as his main beverage.   Lives alone at baseline and is independent of ADLs. 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  Discussed nutrition supplements with patient. He has never had them in the past but is agreeable to receiving after his procedure.     Nutritionally Relevant Medications: Scheduled Meds:  cholecalciferol   5,000 Units Oral Daily   diphenhydrAMINE  25 mg Intravenous Once   magnesium oxide  400 mg Oral Daily   methylPREDNISolone sodium succinate  125 mg Intravenous Once   pantoprazole (PROTONIX) IV  40 mg Intravenous Q24H   potassium chloride  40 mEq Oral Q4H   Continuous Infusions:  sodium chloride 50 mL/hr at 05/15/21 2119   PRN Meds: prochlorperazine  Labs Reviewed: Potassium 3.2 Creatinine 1.43 Magnesium 1.6 SBG ranges from 96-220 mg/dL over the last 24 hours HgbA1c 6.1% (2/1)   NUTRITION - FOCUSED PHYSICAL EXAM: Flowsheet Row Most Recent Value  Orbital Region Severe depletion  Upper Arm Region Severe depletion  Thoracic and Lumbar Region Severe depletion  Buccal Region Severe depletion  Temple Region Severe depletion  Clavicle Bone Region Mild depletion  Clavicle and Acromion Bone Region Mild depletion  Scapular Bone Region Moderate depletion  Dorsal Hand Severe depletion  Patellar Region Severe depletion  Anterior Thigh Region Severe depletion  Posterior Calf Region Severe depletion  Edema (RD Assessment) None  Hair Reviewed  Eyes Reviewed  Mouth Reviewed  Skin Reviewed  Nails Reviewed   Diet Order:   Diet Order             Diet NPO time specified Except for: Sips with Meds  Diet effective midnight                  EDUCATION NEEDS:  Education needs have been addressed  Skin:  Skin Assessment: Reviewed RN Assessment (per WOC: right trochanter erythematous lesion measures 2 cm x 1.8 cm with 0.3 cm scabbed center)  Last BM:  1/29  Height:  Ht Readings from Last  1 Encounters:  05/13/21 5' 10.5" (1.791 m)    Weight:  Wt Readings from Last 1 Encounters:  05/13/21 70.3 kg    Ideal Body Weight:  78.2 kg  BMI:  Body mass index is 21.93 kg/m.  Estimated Nutritional Needs:  Kcal:  1800-2000 kcal/d Protein:  90-100 g/d Fluid:  1.8-2L/d   Ranell Patrick, RD, LDN Clinical Dietitian RD pager # available in AMION  After hours/weekend pager #  available in [facility]

## 2021-05-15 NOTE — Consult Note (Signed)
Reason for Consult:Swallow syncope Referring Physician: [facility]  Clinton Sawyer HPI: This is an 81 year old male with a PMH of an LA Grade A esophagitis and small hiatal hernia (EGD 2010), colonic polyps (last colonoscopy 2018), s/p right hemicolectomy, DM, hyperlipidemia, and macular degeneration admitted for complaints of odynophagia, nausea, and vomiting.  He stated that he started to have these symptoms acutely this past Monday.  Prior to this time he was well with his swallowing.  He denies any recent history of antibiotic use or steroid inhaler use.  In the ER he was noted to have sinus pauses and he plan was for him to have a pacemaker placed, however, with the swallow syncope GI was consulted to further evaluate his symptoms.  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    Ulcer    gastric    Past Surgical History:  Procedure Laterality Date   BACK SURGERY  1998   Dr.Nitka   BACK SURGERY  03/2006   Removed [place] Dr.Nitka    CARPAL TUNNEL RELEASE  08/2006   CARPAL TUNNEL RELEASE  08/2006   [facility]    CATARACT EXTRACTION  02/21/2007   Dr.Epps    EYE SURGERY  2013   Right eye every 8 weeks    KIDNEY STONE SURGERY  08/11/2007   Dr.Tannenbaum    LITHOTRIPSY  08/11/2007   Dr.Nesi   REFRACTIVE SURGERY  09/2005   Macular Degeneration Dr.Mathews    SHOULDER ARTHROSCOPY WITH SUBACROMIAL DECOMPRESSION, ROTATOR CUFF REPAIR AND BICEP TENDON REPAIR Left 08/22/2013   Procedure:  LEFT SHOULDER DIAGNOSTIC OPERATIVE ARTHROSCOPY ,EXTENSIVE DEBRIDEMENT, BICEPS RELEASE AND TENODESIS, ROTATOR CUFF REPAIR, SUBACROMIAL  DECOMPRESSION;  Surgeon: Meredith Pel, MD;  Location: [facility];  Service: Orthopedics;  Laterality: Left;  LEFT SHOULDER DOA,EXTENSIVE DEBRIDEMENT, BICEPS RELEASE AND TENODESIS, ROTATOR CUFF REPAIR, SUBACROMIAL DECOMPRESSION,    Family History  Problem Relation Age of Onset   Alzheimer's disease Mother    Colon cancer Father    Diabetes Sister     Social History:  reports that he has been smoking cigarettes. He has a 88.50 pack-year smoking history. He has never used smokeless tobacco. He reports that he does not drink alcohol and does not use drugs.  Allergies:  Allergies  Allergen Reactions   Bee Pollen Anaphylaxis   Morphine And Related Other (See Comments)    "Kidney failure"   Tape Rash and Other (See Comments)    Any medical tape = rashes   Metformin Diarrhea and Other (See Comments)    Severe Diarrhea    Metformin And Related Diarrhea and Other (See Comments)    Severe Diarrhea    Morphine Other (See Comments)    "Shut down my kidneys"   Statins Other (See Comments)    Loss of appetite   Fluorescein-Benoxinate Rash   Iodinated Contrast Media Rash    Medications: Scheduled:  [START ON 05/16/2021] feeding supplement (GLUCERNA SHAKE)  237 mL Oral TID BM   finasteride  5 mg Oral Daily   gentamicin irrigation  80  mg Irrigation On Call   pantoprazole (PROTONIX) IV  40 mg Intravenous Q12H   sodium chloride flush  3 mL Intravenous Q12H   Continuous:  sodium chloride 50 mL/hr at 05/15/21 6203   sodium chloride      ceFAZolin (ANCEF) IV     lactated ringers 100 mL/hr at 05/14/21 0501    Results for orders placed or performed during the hospital encounter of 05/13/21 (from the past 24 hour(s))  CBC with Differential/Platelet     Status: Abnormal   Collection Time: 05/15/21  1:03 AM  Result Value Ref Range   WBC 13.6 (H) 4.0 - 10.5 K/uL   RBC 3.96 (L) 4.22 - 5.81 MIL/uL   Hemoglobin 11.9 (L) 13.0 - 17.0 g/dL   HCT 36.5 (L) 39.0 - 52.0 %   MCV 92.2 80.0 - 100.0 fL    MCH 30.1 26.0 - 34.0 pg   MCHC 32.6 30.0 - 36.0 g/dL   RDW 13.2 11.5 - 15.5 %   Platelets 165 150 - 400 K/uL   nRBC 0.0 0.0 - 0.2 %   Neutrophils Relative % 73 %   Neutro Abs 9.9 (H) 1.7 - 7.7 K/uL   Lymphocytes Relative 17 %   Lymphs Abs 2.3 0.7 - 4.0 K/uL   Monocytes Relative 9 %   Monocytes Absolute 1.2 (H) 0.1 - 1.0 K/uL   Eosinophils Relative 0 %   Eosinophils Absolute 0.1 0.0 - 0.5 K/uL   Basophils Relative 0 %   Basophils Absolute 0.1 0.0 - 0.1 K/uL   Immature Granulocytes 1 %   Abs Immature Granulocytes 0.07 0.00 - 0.07 K/uL  Basic metabolic panel     Status: Abnormal   Collection Time: 05/15/21  1:03 AM  Result Value Ref Range   Sodium 139 135 - 145 mmol/L   Potassium 3.2 (L) 3.5 - 5.1 mmol/L   Chloride 103 98 - 111 mmol/L   CO2 26 22 - 32 mmol/L   Glucose, Bld 96 70 - 99 mg/dL   BUN 18 8 - 23 mg/dL   Creatinine, Ser 1.43 (H) 0.61 - 1.24 mg/dL   Calcium 8.4 (L) 8.9 - 10.3 mg/dL   GFR, Estimated 50 (L) >60 mL/min   Anion gap 10 5 - 15  Magnesium     Status: Abnormal   Collection Time: 05/15/21  1:03 AM  Result Value Ref Range   Magnesium 1.6 (L) 1.7 - 2.4 mg/dL  Surgical PCR screen     Status: None   Collection Time: 05/15/21  6:04 AM   Specimen: Nasal Mucosa; Nasal Swab  Result Value Ref Range   MRSA, PCR NEGATIVE NEGATIVE   Staphylococcus aureus NEGATIVE NEGATIVE     No results found.  ROS:  As stated above in the HPI otherwise negative.  Blood pressure (!) 142/79, pulse 80, temperature 98.5 F (36.9 C), temperature source Oral, resp. rate 18, height 5' 10.5" (1.791 m), weight 70.3 kg, SpO2 100 %.    PE: Gen: NAD, Alert and Oriented HEENT:  [place]/AT, EOMI Neck: Supple, no LAD Lungs: CTA Bilaterally CV: RRR without M/G/R ABD: Soft, NTND, +BS Ext: No C/C/E  Assessment/Plan: 1) Swallow syncope. 2) Bradycardia. 3) DM. 4) Tobacco abuse.   A literature search suggests that the patient has swallow syncope.  This is an uncommon condition and there were  associations with Achalasia, however, his prior history with the EGD does not suggest this etiology.  Ultimate treatment was with a pacemaker placement in some of the cases.  It will be prudent to evaluate the patient with an esophagram.  It does not appear to be safe to perform an EGD as esophageal stimulation appears to cause the sinus pauses/bradycardia.  Plan: 1) Await esophagram. 2) Dr. Collene Mares will round on the patient tomorrow.  Shanavia Makela D 05/15/2021, 3:44 PM

## 2021-05-15 NOTE — Progress Notes (Signed)
EKG obtained this AM pt still having changes in from 1st to 2nd degree HB on monitor also discussed with monitor room. No further advances with HB noted on new EKG. Will continue to monitor pt very closely.

## 2021-05-15 NOTE — Progress Notes (Signed)
OT Cancellation Note  Patient Details Name: CAGE GUPTON MRN: [id number] DOB: 1940-10-20   Cancelled Treatment:    Reason Eval/Treat Not Completed: Medical issues which prohibited therapy. Patient awaiting further treatment of sinus pause with plan for PPM. OT to check back 2/3.   Gloris Manchester OTR/L Supplemental OT, Department of rehab services [phone]  Geisha Abernathy R H. 05/15/2021, 7:07 AM

## 2021-05-15 NOTE — Plan of Care (Signed)
°  Problem: Activity: Goal: Risk for activity intolerance will decrease Outcome: Progressing   Problem: Elimination: Goal: Will not experience complications related to bowel motility Outcome: Progressing Goal: Will not experience complications related to urinary retention Outcome: Progressing   Problem: Pain Managment: Goal: General experience of comfort will improve Outcome: Progressing   Problem: Safety: Goal: Ability to remain free from injury will improve Outcome: Progressing   

## 2021-05-15 NOTE — Progress Notes (Addendum)
0800: RN notified that patient was in [place].  CV strip patient converted this AM at 0607.  EKG shows Atrial Flutter  Tommye Standard PA notified and aware.  No new orders received.

## 2021-05-15 NOTE — Progress Notes (Addendum)
Patient complained to student nurse and instructor of CP, during which time patients heart rate dropped to the 40s.  Scott Standard PA notified, Joseph Art states she will assess patient.   Per Tevyn notify attending.    Follow Up: Dr Roderic Palau notified and aware, no new orders received.

## 2021-05-15 NOTE — Progress Notes (Signed)
PROGRESS NOTE    Scott Willis  NGE:[id number] DOB: 25-Mar-1941 DOA: 05/13/2021 PCP: Eunice Blase, MD    Chief Complaint  Patient presents with   Emesis    Brief Narrative:  Patient is 81 year old gentleman history of osteoarthritis, hypertension, right eye blepharitis, blindness, BPH, type 2 diabetes, hyperlipidemia, nephrolithiasis, hypogonadism, osteoporosis in the setting of androgen replacement therapy, gastric ulcer, near syncopal episode presented to the ED with persistent nausea, multiple episodes of emesis.  Patient denied any diarrhea constipation melena hematochezia.  Patient denied any urinary symptoms.  Patient noted in the ED to have some sinus pauses.  Patient also noted to have some positional lightheadedness but no chest pain palpitations or diaphoresis.  Patient seen in the ED COVID-19 PCR negative.  Influenza PCR negative.  Cardiac enzymes normal.  Urinalysis with some glycosuria.  Chest x-ray done no acute cardiopulmonary disease.  Head CT showed hypodensity in the left aspect of the pons suspicious for infarct new since 2017, MRI brain done with small remote infarct in the left pons.  Patient noted to have ongoing multiple pauses.  Patient seen in consultation by cardiology who recommended transfer to [facility] for evaluation by EP and possible PPM placement.  2D echo done.   Assessment & Plan:   Principal Problem:   Sinus pause Active Problems:   Benign essential hypertension   BPH (benign prostatic hyperplasia)   Type 2 diabetes mellitus (HCC)   Tobacco use disorder   Depression   Near syncope   Gastro-esophageal reflux disease without esophagitis   Stage 3a chronic kidney disease (CKD) (HCC)   Nausea and vomiting   Borderline prolonged QT interval   Abnormal head CT   Protein-calorie malnutrition, severe  #1 sinus pauses noted on telemetry -Patient noted to have multiple sinus pauses since admission noted on telemetry with some intermittent  occasional odynophagia noted by evaluation by speech therapy. -Patient noted to have lightheadedness, denies any syncopal episodes. -Patient noted to have multiple sinus pauses greater than 5 seconds. -2D echo done with EF of 60 to 40%,NUUV, grade 1 diastolic dysfunction, normal right ventricular systolic function.  No significant valvular abnormalities. -Patient seen in consultation by cardiology who are recommending transfer to [facility] for evaluation by EP for probable PPM placement. -Pacer pads at bedside. -seen by EP and request that GI issues be addressed prior to PPM placement  2.  Abnormal head CT/remote infarct -Patient presented with questionable slurred speech per admitting physician, some dizziness, nausea or emesis. -CT head done with age-indeterminate CVA noted. -MRI brain done with remote infarct noted in the left pons region. -Carotid Dopplers with no significant ICA stenosis. -2D echo with no embolic source is noted. -PT/OT/ST. -discussed with Neurology, Dr. Lorrin Goodell who felt MRI findings were chronic -recommendations were for risk factor modification -would benefit from anticoagulation and statin on discharge  3.  Intractable nausea vomiting with chest discomfort -appears to have started rather suddenly -Placed on PPI. -GI consulted -esophagram has been ordered  4.  Borderline prolonged QT interval -Avoid QT prolonging medications. -Repeat EKG with QTC of 509. -Replete electrolytes keep potassium approximately 4, magnesium greater than 2. -Cardiology following.  5.  Odynophagia -Patient with complaints of difficulty swallowing which was noted by speech therapy associated with oral intake. -appreciate GI input -esophagram has been ordered -Placed on IV PPI  6.  Hypertension -Permissive hypertension. -Once tolerating oral intake can start on oral antihypertensive for better blood pressure management. -Cardiology following.  7.  Tobacco  use  disorder -Tobacco cessation.  8.  BPH -Resume home regimen Proscar.  9.  GERD -IV PPI.  10.  Stage IIIa CKD -Stable.  11.  Diabetes mellitus type 2 -Hemoglobin A1c 6.1 (05/14/2021) -He is on Januvia as outpatient -currently on SSI  12. Hypokalemia/Hypomagnesemia -replace -could not tolerate all of oral potassium ordered this AM, will replete through IV  13. Paroxysmal A fib -Patient reports history of the same -review of cardiology notes indicate that he did not want anticoagulation in the past -CHADSVASC of 6 -discussed anticoagulation with patient and he will consider it prior to discharge.  DVT prophylaxis: SCDs Code Status: Full Family Communication: Updated patient.  No family at bedside. Disposition:   Status is: Inpatient The patient will require care spanning > 2 midnights and should be moved to inpatient because: continue management of arrythmias and nausea/vomiting  Planned Discharge Destination: To be determined        Consultants:  Cardiology: Dr. Gardiner Rhyme 05/13/2021  Procedures: 2D echo 05/13/2021 Carotid Dopplers 05/13/2021 MRI brain 05/13/2021 Chest x-ray 05/13/2021 CT head without contrast 05/13/2021      Antimicrobials: None   Subjective: Continues to have chest discomfort and nausea after any po intake. This happens with solids and liquids. Feels as though after swallowing, he gets discomfort in his chest and foods begins to come back up. This also correlates with periods of bradycardia  Objective: Vitals:   05/15/21 0745 05/15/21 1235 05/15/21 1510 05/15/21 2010  BP: 125/63 128/72 (!) 142/79 (!) 160/68  Pulse: 84 80  70  Resp: 17 20 18    Temp: 98.8 F (37.1 C) 98.4 F (36.9 C) 98.5 F (36.9 C) 98.1 F (36.7 C)  TempSrc: Oral Oral Oral Oral  SpO2: 97% 100%  94%  Weight:      Height:        Intake/Output Summary (Last 24 hours) at 05/15/2021 2032 Last data filed at 05/15/2021 1655 Gross per 24 hour  Intake --  Output 925 ml   Net -925 ml   Filed Weights   05/13/21 0517  Weight: 70.3 kg    Examination:  General exam: Alert, awake, oriented x 3 Respiratory system: Clear to auscultation. Respiratory effort normal. Cardiovascular system:RRR. No murmurs, rubs, gallops. Gastrointestinal system: Abdomen is nondistended, soft and nontender. No organomegaly or masses felt. Normal bowel sounds heard. Central nervous system: Alert and oriented. No focal neurological deficits. Extremities: No C/C/E, +pedal pulses Skin: No rashes, lesions or ulcers Psychiatry: Judgement and insight appear normal. Mood & affect appropriate.      Data Reviewed: I have personally reviewed following labs and imaging studies  CBC: Recent Labs  Lab 05/13/21 0414 05/13/21 0432 05/15/21 0103  WBC 8.9  --  13.6*  NEUTROABS 6.1  --  9.9*  HGB 12.8* 13.3 11.9*  HCT 39.1 39.0 36.5*  MCV 92.9  --  92.2  PLT 184  --  793    Basic Metabolic Panel: Recent Labs  Lab 05/13/21 0414 05/13/21 0432 05/13/21 0627 05/15/21 0103  NA 136 139  --  139  K 3.9 4.0  --  3.2*  CL 103 104  --  103  CO2 25  --   --  26  GLUCOSE 216* 220*  --  96  BUN 15 12  --  18  CREATININE 1.31* 1.50*  --  1.43*  CALCIUM 9.4  --   --  8.4*  MG  --   --  2.0 1.6*    GFR: Estimated  Creatinine Clearance: 41 mL/min (A) (by C-G formula based on SCr of 1.43 mg/dL (H)).  Liver Function Tests: Recent Labs  Lab 05/13/21 0414  AST 19  ALT 13  ALKPHOS 48  BILITOT 0.9  PROT 6.8  ALBUMIN 3.6    CBG: No results for input(s): GLUCAP in the last 168 hours.   Recent Results (from the past 240 hour(s))  Resp Panel by RT-PCR (Flu A&B, Covid) Nasopharyngeal Swab     Status: None   Collection Time: 05/13/21  4:11 AM   Specimen: Nasopharyngeal Swab; Nasopharyngeal(NP) swabs in vial transport medium  Result Value Ref Range Status   SARS Coronavirus 2 by RT PCR NEGATIVE NEGATIVE Final    Comment: (NOTE) SARS-CoV-2 target nucleic acids are NOT  DETECTED.  The SARS-CoV-2 RNA is generally detectable in upper respiratory specimens during the acute phase of infection. The lowest concentration of SARS-CoV-2 viral copies this assay can detect is 138 copies/mL. A negative result does not preclude SARS-Cov-2 infection and should not be used as the sole basis for treatment or other patient management decisions. A negative result may occur with  improper specimen collection/handling, submission of specimen other than nasopharyngeal swab, presence of viral mutation(s) within the areas targeted by this assay, and inadequate number of viral copies(<138 copies/mL). A negative result must be combined with clinical observations, patient history, and epidemiological information. The expected result is Negative.  Fact Sheet for Patients:  EntrepreneurPulse.com.au  Fact Sheet for Healthcare Providers:  IncredibleEmployment.be  This test is no t yet approved or cleared by the [place] FDA and  has been authorized for detection and/or diagnosis of SARS-CoV-2 by FDA under an Emergency Use Authorization (EUA). This EUA will remain  in effect (meaning this test can be used) for the duration of the COVID-19 declaration under Section 564(b)(1) of the Act, 21 U.S.C.section 360bbb-3(b)(1), unless the authorization is terminated  or revoked sooner.       Influenza A by PCR NEGATIVE NEGATIVE Final   Influenza B by PCR NEGATIVE NEGATIVE Final    Comment: (NOTE) The Xpert Xpress SARS-CoV-2/FLU/RSV plus assay is intended as an aid in the diagnosis of influenza from Nasopharyngeal swab specimens and should not be used as a sole basis for treatment. Nasal washings and aspirates are unacceptable for Xpert Xpress SARS-CoV-2/FLU/RSV testing.  Fact Sheet for Patients: EntrepreneurPulse.com.au  Fact Sheet for Healthcare Providers: IncredibleEmployment.be  This test is not yet  approved or cleared by the [place] FDA and has been authorized for detection and/or diagnosis of SARS-CoV-2 by FDA under an Emergency Use Authorization (EUA). This EUA will remain in effect (meaning this test can be used) for the duration of the COVID-19 declaration under Section 564(b)(1) of the Act, 21 U.S.C. section 360bbb-3(b)(1), unless the authorization is terminated or revoked.  Performed at [facility], [place] [address] [place], [place] [address]   Surgical PCR screen     Status: None   Collection Time: 05/15/21  6:04 AM   Specimen: Nasal Mucosa; Nasal Swab  Result Value Ref Range Status   MRSA, PCR NEGATIVE NEGATIVE Final   Staphylococcus aureus NEGATIVE NEGATIVE Final    Comment: (NOTE) The Xpert SA Assay (FDA approved for NASAL specimens in patients 51 years of age and older), is one component of a comprehensive surveillance program. It is not intended to diagnose infection nor to guide or monitor treatment. Performed at [facility], [place] [address], [place], [place] [address]  Radiology Studies: No results found.      Scheduled Meds:  [START ON 05/16/2021] feeding supplement (GLUCERNA SHAKE)  237 mL Oral TID BM   finasteride  5 mg Oral Daily   gentamicin irrigation  80 mg Irrigation On Call   pantoprazole (PROTONIX) IV  40 mg Intravenous Q12H   sodium chloride flush  3 mL Intravenous Q12H   Continuous Infusions:  sodium chloride 50 mL/hr at 05/15/21 2957   sodium chloride      ceFAZolin (ANCEF) IV     lactated ringers 100 mL/hr at 05/14/21 0501     LOS: 1 day    Time spent: 64 minutes    Kathie Dike, MD [facility]   To contact the attending provider between 7A-7P or the covering provider during after hours 7P-7A, please log into the web site [email or web address] and access using universal [place] password for that web site. If you do not have the password, please call the hospital  operator.  05/15/2021, 8:32 PM

## 2021-05-15 NOTE — Progress Notes (Addendum)
Progress Note  Patient Name: Scott Willis Date of Encounter: 05/15/2021  [facility] Cardiologist: Buford Dresser, MD   Subjective   A couple fleeting L CP this AM, nauseous with eating last night, no vomiting, no other symptoms  Inpatient Medications    Scheduled Meds:  chlorhexidine  60 mL Topical Once   chlorhexidine  60 mL Topical Once   cholecalciferol  5,000 Units Oral Daily   finasteride  5 mg Oral Daily   gentamicin irrigation  80 mg Irrigation On Call   magnesium oxide  400 mg Oral Daily   pantoprazole (PROTONIX) IV  40 mg Intravenous Q24H   potassium chloride  40 mEq Oral Q4H   sodium chloride flush  3 mL Intravenous Q12H   Continuous Infusions:  sodium chloride 50 mL/hr at 05/15/21 4270   sodium chloride      ceFAZolin (ANCEF) IV     lactated ringers 100 mL/hr at 05/14/21 0501   magnesium sulfate bolus IVPB     PRN Meds: acetaminophen **OR** acetaminophen, diclofenac Sodium, LORazepam, polyvinyl alcohol, prochlorperazine, sodium chloride flush   Vital Signs    Vitals:   05/14/21 2029 05/15/21 0027 05/15/21 0517 05/15/21 0745  BP: 139/87 (!) 155/83 134/71 125/63  Pulse: 89 85 72 84  Resp: 18 18 18 17   Temp: 98.2 F (36.8 C) 98.9 F (37.2 C) 98.9 F (37.2 C) 98.8 F (37.1 C)  TempSrc: Oral Oral Oral Oral  SpO2: 98% 96% 94% 97%  Weight:      Height:        Intake/Output Summary (Last 24 hours) at 05/15/2021 0815 Last data filed at 05/15/2021 6237 Gross per 24 hour  Intake --  Output 700 ml  Net -700 ml   Last 3 Weights 05/13/2021 11/19/2020 08/20/2020  Weight (lbs) 155 lb 145 lb 6.4 oz 145 lb 6.4 oz  Weight (kg) 70.308 kg 65.953 kg 65.953 kg      Telemetry    SR 70's-80's, 1st degree and and Mobitz one >> AFib with some pauses 4 seconds,  - Personally Reviewed  ECG    SR 81bpm, 1st degree AVblock, mobitz one AFib 86bpm   - Personally Reviewed  Physical Exam   GEN: No acute distress.   Neck: No JVD Cardiac: irreg-irreg, no  murmurs, rubs, or gallops.  Respiratory: CTA b/l. GI: Soft, nontender, non-distended  MS: No edema; No deformity. Neuro:  Nonfocal  Psych: Normal affect   Labs    High Sensitivity Troponin:   Recent Labs  Lab 05/13/21 0414 05/13/21 0627  TROPONINIHS 3 4     Chemistry Recent Labs  Lab 05/13/21 0414 05/13/21 0432 05/13/21 0627 05/15/21 0103  NA 136 139  --  139  K 3.9 4.0  --  3.2*  CL 103 104  --  103  CO2 25  --   --  26  GLUCOSE 216* 220*  --  96  BUN 15 12  --  18  CREATININE 1.31* 1.50*  --  1.43*  CALCIUM 9.4  --   --  8.4*  MG  --   --  2.0 1.6*  PROT 6.8  --   --   --   ALBUMIN 3.6  --   --   --   AST 19  --   --   --   ALT 13  --   --   --   ALKPHOS 48  --   --   --   BILITOT 0.9  --   --   --  GFRNONAA 55*  --   --  50*  ANIONGAP 8  --   --  10    Lipids  Recent Labs  Lab 05/14/21 0500  CHOL 193  TRIG 76  HDL 46  LDLCALC 132*  CHOLHDL 4.2    Hematology Recent Labs  Lab 05/13/21 0414 05/13/21 0432 05/15/21 0103  WBC 8.9  --  13.6*  RBC 4.21*  --  3.96*  HGB 12.8* 13.3 11.9*  HCT 39.1 39.0 36.5*  MCV 92.9  --  92.2  MCH 30.4  --  30.1  MCHC 32.7  --  32.6  RDW 13.4  --  13.2  PLT 184  --  165   Thyroid No results for input(s): TSH, FREET4 in the last 168 hours.  BNPNo results for input(s): BNP, PROBNP in the last 168 hours.  DDimer No results for input(s): DDIMER in the last 168 hours.   Radiology      Cardiac Studies   Echo 05/13/20:  1. Left ventricular ejection fraction, by estimation, is 60 to 65%. The  left ventricle has normal function. The left ventricle has no regional  wall motion abnormalities. Left ventricular diastolic parameters are  consistent with Grade I diastolic  dysfunction (impaired relaxation).   2. Right ventricular systolic function is normal. The right ventricular  size is normal. There is normal pulmonary artery systolic pressure.   3. The mitral valve is normal in structure. Mild mitral valve   regurgitation. No evidence of mitral stenosis.   4. The aortic valve is normal in structure. Aortic valve regurgitation is  trivial. No aortic stenosis is present.   5. The inferior vena cava is normal in size with greater than 50%  respiratory variability, suggesting right atrial pressure of 3 mmHg.   Patient Profile     81 y.o. 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  Assessment & Plan    Symptomatic bradycardia Monitoring from [facility] was reviewed as well as so far here. He initially was having pauses/CHB episodes as long as 7 seconds (while nauseous) with both P-P and PR prolongation > CHB that is c/w vagally mediated. With improvement in his N/V  he  continued intermittent brady, some with P-P and PR prolongation and a couple shorter and without, though overall improved in that his pausing was not as long.  He is planned for PPM, Dr. Curt Willis has seen and examined him this AM, revisited rational for pacing, patient had no follow up questions and remains agreeable  Scott Willis premedicated for dye allergy (rash) given he may required venogram   2. Afib w/CVR this AM Has hx of the same  Scott Willis discuss a/c with him post pacing, though historically he has declined apparently   3. N/V No retching/vomiting He did get nauseous with food last evening and did not eat much No belly pain Deferred to IM/attending   4. Hypomag, hypokalemia Replacement ordered   ADDEND: Notified by RN that the pt was c/o CP seemingly associated with dips in his HR The patient's HRs 50's- 70's on AFib with some slowing to the 40's with 4 seconds on telemetry, no sustained bradycardia Skin is warm, dry The patient reports that when swallowing the large K+ pill made him nauseous  and  gave him some R sided pain He remains nauseous, and while I am with him some gagging/nausea, no vomiting NOT associated with bradycardia NO CP outside of belching/gaging, swallowing Pending dose of compazine Scott Standard, PA-C   For questions or updates, please contact Scott Willis Please consult [email or web address] for contact info under        Signed, Scott Jamaica, PA-C  05/15/2021, 8:15 AM     I have seen and examined this patient with Scott Willis.  Agree with above, note added to reflect my findings.  Patient continues to have episodes of bradycardia.  He did convert to atrial fibrillation.  He complains of nausea and chest pain when swallowing pills.  Unfortunately throughout the day today, his GI symptoms have worsened.  He was initially planned for pacemaker implant, but this Scott Willis be pushed off as his GI issues need to be addressed prior to cardiology procedures.  GEN: Well nourished, well developed, in no acute distress  HEENT: normal  Neck: no JVD, carotid bruits, or masses Cardiac: Irregular; no murmurs, rubs, or gallops,no edema  Respiratory:  clear to auscultation bilaterally, normal work of breathing GI: soft, nontender, nondistended, + BS MS: no deformity or atrophy  Skin: warm and dry Neuro:  Strength and sensation are intact Psych: euthymic mood, full affect   Symptomatic bradycardia: Appears to be vagal in nature.  He does continue to have episodes of bradycardia, but now that he is in atrial fibrillation, he is not having prolonged pauses.  He may need a pacemaker in the future, though with his current GI issues, this needs to be sorted out prior to any cardiology procedures.  We Orra Nolde hold off on pacemaker implant for now. Atrial fibrillation: Noted today.  He is currently not anticoagulated but is apparently had episodes of atrial fibrillation previously.  Hold off on anticoagulation for now, as the patient has declined in the past.  This Agustine Rossitto need to be discussed  with him further.  Scott Willis M. Paeton Latouche MD 05/15/2021 2:16 PM

## 2021-05-15 NOTE — Progress Notes (Signed)
°  Transition of Care [facility]) Screening Note   Patient Details  Name: Scott Willis Date of Birth: Jun 30, 1940   Transition of Care [facility]) CM/SW Contact:    Milas Gain, [place] Phone Number: 05/15/2021, 5:04 PM    Transition of Care Department [facility]) has reviewed patient and no TOC needs have been identified at this time. We will continue to monitor patient advancement through interdisciplinary progression rounds. If new patient transition needs arise, please place a TOC consult.

## 2021-05-15 NOTE — Consult Note (Signed)
[place] Nurse Consult Note: Reason for Consult:right trochanter lesion.  Present x 15 years.  He has had the area biopsied in the past.  Was told it was precancerous lesion due to sun exposure.  He is not convinced of this.  HE has had MRSA in the past and feels more confident that it is that.  His doctor has not prescribed any treatment.  HAs used steroid cream in the past with no improvement.  Wound type: precancerous, chronic nonhealing.  Pressure Injury POA: NA Measurement: erythematous lesion measures 2 cm x 1.8 cm with 0.3 cm scabbed center. Dry no drainage.  Wound bed:See above  tender to touch and lay on.  Drainage (amount, consistency, odor) none Periwound:intact Dressing procedure/placement/frequency: Silicone foam in place to pad and protect, promote comfort change every three days.  Will not follow at this time.  Please re-consult if needed.  Domenic Moras MSN, RN, FNP-BC CWON Wound, Ostomy, Continence Nurse Pager [phone]

## 2021-05-16 ENCOUNTER — Inpatient Hospital Stay (HOSPITAL_COMMUNITY): Payer: HMO

## 2021-05-16 DIAGNOSIS — I455 Other specified heart block: Secondary | ICD-10-CM | POA: Diagnosis not present

## 2021-05-16 DIAGNOSIS — I10 Essential (primary) hypertension: Secondary | ICD-10-CM | POA: Diagnosis not present

## 2021-05-16 DIAGNOSIS — R930 Abnormal findings on diagnostic imaging of skull and head, not elsewhere classified: Secondary | ICD-10-CM | POA: Diagnosis not present

## 2021-05-16 DIAGNOSIS — N400 Enlarged prostate without lower urinary tract symptoms: Secondary | ICD-10-CM | POA: Diagnosis not present

## 2021-05-16 LAB — BASIC METABOLIC PANEL
Anion gap: 8 (ref 5–15)
BUN: 26 mg/dL — ABNORMAL HIGH (ref 8–23)
CO2: 22 mmol/L (ref 22–32)
Calcium: 7.8 mg/dL — ABNORMAL LOW (ref 8.9–10.3)
Chloride: 107 mmol/L (ref 98–111)
Creatinine, Ser: 1.36 mg/dL — ABNORMAL HIGH (ref 0.61–1.24)
GFR, Estimated: 53 mL/min — ABNORMAL LOW (ref >60)
Glucose, Bld: 88 mg/dL (ref 70–99)
Potassium: 4.2 mmol/L (ref 3.5–5.1)
Sodium: 137 mmol/L (ref 135–145)

## 2021-05-16 LAB — CBC
HCT: 35.7 % — ABNORMAL LOW (ref 39.0–52.0)
Hemoglobin: 11.7 g/dL — ABNORMAL LOW (ref 13.0–17.0)
MCH: 30 pg (ref 26.0–34.0)
MCHC: 32.8 g/dL (ref 30.0–36.0)
MCV: 91.5 fL (ref 80.0–100.0)
Platelets: 168 10*3/uL (ref 150–400)
RBC: 3.9 MIL/uL — ABNORMAL LOW (ref 4.22–5.81)
RDW: 13.2 % (ref 11.5–15.5)
WBC: 11.3 10*3/uL — ABNORMAL HIGH (ref 4.0–10.5)
nRBC: 0 % (ref 0.0–0.2)

## 2021-05-16 LAB — GLUCOSE, CAPILLARY
Glucose-Capillary: 97 mg/dL (ref 70–99)
Glucose-Capillary: 118 mg/dL — ABNORMAL HIGH (ref 70–99)
Glucose-Capillary: 89 mg/dL (ref 70–99)
Glucose-Capillary: 87 mg/dL (ref 70–99)

## 2021-05-16 LAB — MAGNESIUM: Magnesium: 2.6 mg/dL — ABNORMAL HIGH (ref 1.7–2.4)

## 2021-05-16 MED ORDER — NITROGLYCERIN 0.4 MG SL SUBL
0.4000 mg | SUBLINGUAL_TABLET | Freq: Once | SUBLINGUAL | Status: AC
  Administered 2021-05-16: 0.4 mg via SUBLINGUAL
  Filled 2021-05-16: qty 1

## 2021-05-16 NOTE — Progress Notes (Addendum)
Subjective: 81 year old white male who presented with acute dysphagia that started earlier this week.  Prior to this he did not have any problems with his swallowing.  Denies recent inhaler antibiotic use.  He denies use of any new medications either in the ER he was noted to have sinus pauses and a pacemaker has been planned for him.  A barium swallow done today revealed overall reduction esophageal distensibility and caliber with mild relative narrowing along the aortic arch and the GE junction without obstruction with passage of contrast.  The barium pill did not pass t through the mid esophagus in spite of several swallows.  We attempted to give him some Ensure this evening and he continues to have sinus pauses.  He was having problems with complete heart block and episodes of PR prolongation when he was vomiting-this was thought to be vagally mediated with his improvement in nausea and vomiting he continues to be bradycardic with PR prolonged patient. Atrial fibrillation with controlled ventricular rate this morning.PPM planned.  Objective: Vital signs in last 24 hours: Temp:  [98.1 F (36.7 C)-98.8 F (37.1 C)] 98.2 F (36.8 C) (02/03 0529) Pulse Rate:  [63-84] 63 (02/03 0529) Resp:  [17-20] 17 (02/03 0529) BP: (124-160)/(54-79) 155/69 (02/03 0529) SpO2:  [94 %-100 %] 97 % (02/03 0529) Last BM Date: 05/11/21  Intake/Output from previous day: 02/02 0701 - 02/03 0700 In: -  Out: 925 [Urine:925] Intake/Output this shift: No intake/output data recorded.  General appearance: alert, cooperative, appears stated age, and no distress Resp: clear to auscultation bilaterally Cardio: regular rate and rhythm, S1, S2 normal, no murmur, click, rub or gallop GI: soft, non-tender; bowel sounds normal; no masses,  no organomegaly  Lab Results: Recent Labs    05/15/21 0103 05/16/21 0329  WBC 13.6* 11.3*  HGB 11.9* 11.7*  HCT 36.5* 35.7*  PLT 165 168   BMET Recent Labs    05/15/21 0103  05/16/21 0329  NA 139 137  K 3.2* 4.2  CL 103 107  CO2 26 22  GLUCOSE 96 88  BUN 18 26*  CREATININE 1.43* 1.36*  CALCIUM 8.4* 7.8*    Studies/Results: No results found.  Medications: I have reviewed the patient's current medications. Prior to Admission:  Medications Prior to Admission  Medication Sig Dispense Refill Last Dose   Cholecalciferol (VITAMIN D-3) 125 MCG (5000 UT) TABS Take 5,000 Units by mouth daily.   05/12/2021   diazepam (VALIUM) 5 MG tablet Take 5 mg by mouth See admin instructions. Take 5 mg by mouth as directed before right eye injection every 10 weeks   05/09/2021   diclofenac sodium (VOLTAREN) 1 % GEL Apply 4 g topically 4 (four) times daily as needed. (Patient taking differently: Apply 4 g topically 4 (four) times daily as needed (for pain).) 500 g 6 unk   finasteride (PROSCAR) 5 MG tablet TAKE 1 TABLET BY MOUTH EVERY DAY (Patient taking differently: Take 5 mg by mouth daily.) 90 tablet 3 05/12/2021   JANUVIA 100 MG tablet TAKE 1 TABLET BY MOUTH EVERY DAY (Patient taking differently: Take 100 mg by mouth daily.) 90 tablet 1 05/12/2021   Magnesium Oxide 400 (240 Mg) MG TABS TAKE 1 TABLET BY MOUTH EVERY DAY (Patient taking differently: Take 400 mg by mouth daily.) 90 tablet 3 05/12/2021   Multiple Vitamins-Minerals (PRESERVISION AREDS 2 PO) Take 1 capsule by mouth daily.   05/12/2021   mupirocin ointment (BACTROBAN) 2 % Apply 1 application topically 2 (two) times daily. (Patient taking  differently: Apply 1 application topically See admin instructions. Apply to the right hip once a day for wound care) 22 g 1 05/12/2021   oxyCODONE-acetaminophen (PERCOCET/ROXICET) 5-325 MG tablet Take 1-2 tablets by mouth every 6 (six) hours as needed for severe pain. (Patient taking differently: Take 1 tablet by mouth See admin instructions. Take 1 tablet by mouth as directed before right eye injection every 10 weeks) 30 tablet 0 Past Week   SYSTANE COMPLETE 0.6 % SOLN Place 1 drop into both  eyes 3 (three) times daily as needed (for irritation).   unk   tamsulosin (FLOMAX) 0.4 MG CAPS capsule TAKE 1 CAPSULE BY MOUTH EVERY DAY (Patient taking differently: Take 0.4 mg by mouth daily.) 90 capsule 3 05/12/2021   trimethoprim-polymyxin b (POLYTRIM) ophthalmic solution Place 1 drop into the right eye See admin instructions. Instill 1 drop into the right eye four times a day for 2 days after eye injection every 10 weeks   05/11/2021   blood glucose meter kit and supplies KIT Check CBG QID 1 each 0    polyethylene glycol (GOLYTELY) 236 g solution Use once daily until bowels are moving normally. (Patient not taking: Reported on 05/13/2021) 4000 mL 11 Not Taking   Prodigy Twist Top Lancets 28G MISC       venlafaxine (EFFEXOR) 75 MG tablet Take 1 tablet (75 mg total) by mouth 2 (two) times daily. (Patient not taking: Reported on 05/13/2021) 180 tablet 1 Not Taking   Scheduled:  feeding supplement (GLUCERNA SHAKE)  237 mL Oral TID BM   finasteride  5 mg Oral Daily   insulin aspart  0-9 Units Subcutaneous TID WC   pantoprazole (PROTONIX) IV  40 mg Intravenous Q12H   sodium chloride flush  3 mL Intravenous Q12H    Assessment/Plan: CHB/Sinus pauses/syncope associated with swallowing-barium pill did not pass the mid esophagus and therefore he will benefit from an EGD. This might be safer after the PPM has been placed. Dr. Christia Reading with the [facility] is covering over the weekend. Please call her if there are any questions or concerns 2) AODM/HTH/Hyperlipidemia. 3) Tobacco abuse. 4) Chronic back pain.  LOS: 2 days   Juanita Craver 05/16/2021, 7:09 AM

## 2021-05-16 NOTE — Progress Notes (Signed)
PROGRESS NOTE    Scott Willis  VVO:[id number] DOB: 01/08/1941 DOA: 05/13/2021 PCP: Eunice Blase, MD    Chief Complaint  Patient presents with   Emesis    Brief Narrative:  Patient is 81 year old gentleman history of osteoarthritis, hypertension, right eye blepharitis, blindness, BPH, type 2 diabetes, hyperlipidemia, nephrolithiasis, hypogonadism, osteoporosis in the setting of androgen replacement therapy, gastric ulcer, near syncopal episode presented to the ED with persistent nausea, multiple episodes of emesis.  Patient denied any diarrhea constipation melena hematochezia.  Patient denied any urinary symptoms.  Patient noted in the ED to have some sinus pauses.  Patient also noted to have some positional lightheadedness but no chest pain palpitations or diaphoresis.  Patient seen in the ED COVID-19 PCR negative.  Influenza PCR negative.  Cardiac enzymes normal.  Urinalysis with some glycosuria.  Chest x-ray done no acute cardiopulmonary disease.  Head CT showed hypodensity in the left aspect of the pons suspicious for infarct new since 2017, MRI brain done with small remote infarct in the left pons.  Patient noted to have ongoing multiple pauses.  Patient seen in consultation by cardiology who recommended transfer to [facility] for evaluation by EP and possible PPM placement.  2D echo done.   Assessment & Plan:   Principal Problem:   Sinus pause Active Problems:   Benign essential hypertension   BPH (benign prostatic hyperplasia)   Type 2 diabetes mellitus (HCC)   Tobacco use disorder   Depression   Near syncope   Gastro-esophageal reflux disease without esophagitis   Stage 3a chronic kidney disease (CKD) (HCC)   Nausea and vomiting   Borderline prolonged QT interval   Abnormal head CT   Protein-calorie malnutrition, severe  #1 sinus pauses noted on telemetry -Patient noted to have multiple sinus pauses since admission noted on telemetry with some intermittent  occasional odynophagia noted by evaluation by speech therapy. -Patient noted to have lightheadedness, denies any syncopal episodes. -Patient noted to have multiple sinus pauses greater than 5 seconds. -2D echo done with EF of 60 to 26%,RSWN, grade 1 diastolic dysfunction, normal right ventricular systolic function.  No significant valvular abnormalities. -Patient seen in consultation by cardiology who are recommending transfer to [facility] for evaluation by EP  -Pacer pads at bedside. -seen by EP and it was not felt that permanent pacemaker placement was indicated -per EP, a temporary transvenous pacer could be placed if GI feels needed prior to attempting EGD  2.  Abnormal head CT/remote infarct -Patient presented with questionable slurred speech per admitting physician, some dizziness, nausea or emesis. -CT head done with age-indeterminate CVA noted. -MRI brain done with remote infarct noted in the left pons region. -Carotid Dopplers with no significant ICA stenosis. -2D echo with no embolic source is noted. -PT/OT/ST. -discussed with Neurology, Dr. Lorrin Goodell who felt MRI findings were chronic -recommendations were for risk factor modification -would benefit from anticoagulation and statin on discharge  3.  Intractable nausea vomiting with chest discomfort -appears to have started rather suddenly -Placed on PPI. -GI following -esophagram esophagram showed mild relative areas of narrowing at GE junction. Barium pill did not pass to the air level of the mid  -he is still unable to tolerate any po intake  4.  Borderline prolonged QT interval -Avoid QT prolonging medications. -Repeat EKG with QTC of 509. -Replete electrolytes keep potassium approximately 4, magnesium greater than 2. -Cardiology following.  5.  Odynophagia -Patient with complaints of difficulty swallowing which was noted by  speech therapy associated with oral intake. -appreciate GI input -esophagram showed  mild relative areas of narrowing at GE junction. Barium pill did not pass to the air level of the mid esophagus -Placed on IV PPI  6.  Hypertension -Permissive hypertension. -Once tolerating oral intake can start on oral antihypertensive for better blood pressure management. -Cardiology following.  7.  Tobacco use disorder -Tobacco cessation.  8.  BPH -Resume home regimen Proscar.  9.  GERD -IV PPI.  10.  Stage IIIa CKD -Stable.  11.  Diabetes mellitus type 2 -Hemoglobin A1c 6.1 (05/14/2021) -He is on Januvia as outpatient -currently on SSI  12. Hypokalemia/Hypomagnesemia -replaced  13. Paroxysmal A fib -Patient reports history of the same -review of cardiology notes indicate that he did not want anticoagulation in the past -CHADSVASC of 6 -discussed anticoagulation with patient and he will consider it prior to discharge.  DVT prophylaxis: SCDs Code Status: Full Family Communication: Updated patient.  No family at bedside. Disposition:   Status is: Inpatient The patient will require care spanning > 2 midnights and should be moved to inpatient because: continue management of arrythmias and nausea/vomiting  Planned Discharge Destination: To be determined        Consultants:  Cardiology: Dr. Gardiner Rhyme 05/13/2021 GI, Dr, Benson [place] EP Dr. Lovena Le  Procedures: 2D echo 05/13/2021 Carotid Dopplers 05/13/2021 MRI brain 05/13/2021 Chest x-ray 05/13/2021 CT head without contrast 05/13/2021      Antimicrobials: None   Subjective: Reports that he has had continued issues with po intake leading to feelings that he will throw up, causing chest pain and bradycardia  Objective: Vitals:   05/16/21 2048 05/16/21 2100 05/16/21 2105 05/16/21 2110  BP: (!) 176/67 124/70 138/69 (!) 154/67  Pulse: 60 69 65 62  Resp: 18     Temp: 98.3 F (36.8 C)     TempSrc: Oral     SpO2: 97% 95% 94% 92%  Weight:      Height:        Intake/Output Summary (Last 24 hours) at 05/16/2021  2322 Last data filed at 05/16/2021 2054 Gross per 24 hour  Intake --  Output 750 ml  Net -750 ml   Filed Weights   05/13/21 0517  Weight: 70.3 kg    Examination:  General exam: Alert, awake, oriented x 3 Respiratory system: Clear to auscultation. Respiratory effort normal. Cardiovascular system:RRR. No murmurs, rubs, gallops. Gastrointestinal system: Abdomen is nondistended, soft and nontender. No organomegaly or masses felt. Normal bowel sounds heard. Central nervous system: Alert and oriented. No focal neurological deficits. Extremities: No C/C/E, +pedal pulses Skin: No rashes, lesions or ulcers Psychiatry: Judgement and insight appear normal. Mood & affect appropriate.      Data Reviewed: I have personally reviewed following labs and imaging studies  CBC: Recent Labs  Lab 05/13/21 0414 05/13/21 0432 05/15/21 0103 05/16/21 0329  WBC 8.9  --  13.6* 11.3*  NEUTROABS 6.1  --  9.9*  --   HGB 12.8* 13.3 11.9* 11.7*  HCT 39.1 39.0 36.5* 35.7*  MCV 92.9  --  92.2 91.5  PLT 184  --  165 834    Basic Metabolic Panel: Recent Labs  Lab 05/13/21 0414 05/13/21 0432 05/13/21 0627 05/15/21 0103 05/16/21 0329  NA 136 139  --  139 137  K 3.9 4.0  --  3.2* 4.2  CL 103 104  --  103 107  CO2 25  --   --  26 22  GLUCOSE 216* 220*  --  96 88  BUN 15 12  --  18 26*  CREATININE 1.31* 1.50*  --  1.43* 1.36*  CALCIUM 9.4  --   --  8.4* 7.8*  MG  --   --  2.0 1.6* 2.6*    GFR: Estimated Creatinine Clearance: 43.1 mL/min (A) (by C-G formula based on SCr of 1.36 mg/dL (H)).  Liver Function Tests: Recent Labs  Lab 05/13/21 0414  AST 19  ALT 13  ALKPHOS 48  BILITOT 0.9  PROT 6.8  ALBUMIN 3.6    CBG: Recent Labs  Lab 05/15/21 2114 05/16/21 0813 05/16/21 1118 05/16/21 1614 05/16/21 2227  GLUCAP 87 97 118* 89 87     Recent Results (from the past 240 hour(s))  Resp Panel by RT-PCR (Flu A&B, Covid) Nasopharyngeal Swab     Status: None   Collection Time: 05/13/21   4:11 AM   Specimen: Nasopharyngeal Swab; Nasopharyngeal(NP) swabs in vial transport medium  Result Value Ref Range Status   SARS Coronavirus 2 by RT PCR NEGATIVE NEGATIVE Final    Comment: (NOTE) SARS-CoV-2 target nucleic acids are NOT DETECTED.  The SARS-CoV-2 RNA is generally detectable in upper respiratory specimens during the acute phase of infection. The lowest concentration of SARS-CoV-2 viral copies this assay can detect is 138 copies/mL. A negative result does not preclude SARS-Cov-2 infection and should not be used as the sole basis for treatment or other patient management decisions. A negative result may occur with  improper specimen collection/handling, submission of specimen other than nasopharyngeal swab, presence of viral mutation(s) within the areas targeted by this assay, and inadequate number of viral copies(<138 copies/mL). A negative result must be combined with clinical observations, patient history, and epidemiological information. The expected result is Negative.  Fact Sheet for Patients:  EntrepreneurPulse.com.au  Fact Sheet for Healthcare Providers:  IncredibleEmployment.be  This test is no t yet approved or cleared by the [place] FDA and  has been authorized for detection and/or diagnosis of SARS-CoV-2 by FDA under an Emergency Use Authorization (EUA). This EUA will remain  in effect (meaning this test can be used) for the duration of the COVID-19 declaration under Section 564(b)(1) of the Act, 21 U.S.C.section 360bbb-3(b)(1), unless the authorization is terminated  or revoked sooner.       Influenza A by PCR NEGATIVE NEGATIVE Final   Influenza B by PCR NEGATIVE NEGATIVE Final    Comment: (NOTE) The Xpert Xpress SARS-CoV-2/FLU/RSV plus assay is intended as an aid in the diagnosis of influenza from Nasopharyngeal swab specimens and should not be used as a sole basis for treatment. Nasal washings and aspirates  are unacceptable for Xpert Xpress SARS-CoV-2/FLU/RSV testing.  Fact Sheet for Patients: EntrepreneurPulse.com.au  Fact Sheet for Healthcare Providers: IncredibleEmployment.be  This test is not yet approved or cleared by the [place] FDA and has been authorized for detection and/or diagnosis of SARS-CoV-2 by FDA under an Emergency Use Authorization (EUA). This EUA will remain in effect (meaning this test can be used) for the duration of the COVID-19 declaration under Section 564(b)(1) of the Act, 21 U.S.C. section 360bbb-3(b)(1), unless the authorization is terminated or revoked.  Performed at [facility], [place] [address], [place], [place] [address]   Surgical PCR screen     Status: None   Collection Time: 05/15/21  6:04 AM   Specimen: Nasal Mucosa; Nasal Swab  Result Value Ref Range Status   MRSA, PCR NEGATIVE NEGATIVE Final   Staphylococcus aureus NEGATIVE NEGATIVE Final  Comment: (NOTE) The Xpert SA Assay (FDA approved for NASAL specimens in patients 83 years of age and older), is one component of a comprehensive surveillance program. It is not intended to diagnose infection nor to guide or monitor treatment. Performed at [facility], [place] [address] [place], [place] [address]          Radiology Studies: DG ESOPHAGUS W SINGLE CM (SOL OR THIN BA)  Result Date: 05/16/2021 CLINICAL DATA:  Odynophagia; chest pain and vomiting with drinking and eating, difficulty with pills EXAM: ESOPHOGRAM/BARIUM SWALLOW TECHNIQUE: Single contrast examination was performed using thin barium. FLUOROSCOPY: Radiation Exposure Index (as provided by the fluoroscopic device): 1372.6 uGy*m2 DAP COMPARISON:  None. FINDINGS: Unremarkable hypopharyngeal contours. No aspiration. Overall esophageal distensibility and caliber appears reduced. There are mild relative areas of narrowing along the aortic arch and at the gastroesophageal  junction. Disc does not prevent passage of contrast. There is no significant dysmotility. A small hiatal hernia is present. No significant gastroesophageal reflux during the course of the study. Barium pill did not pass through the mid esophagus despite successive swallows of contrast. IMPRESSION: Question overall reduction in esophageal distensibility and caliber. There is no comparison available. Mild relative areas of narrowing along the aortic arch and at the gastroesophageal junction without obstruction to passage of contrast. The barium pill did not pass to the air level of the mid esophagus. Small hiatal hernia. Patient reported pain with swallowing during examination. Electronically Signed   By: Macy Mis M.D.   On: 05/16/2021 09:58        Scheduled Meds:  feeding supplement (GLUCERNA SHAKE)  237 mL Oral TID BM   finasteride  5 mg Oral Daily   insulin aspart  0-9 Units Subcutaneous TID WC   pantoprazole (PROTONIX) IV  40 mg Intravenous Q12H   sodium chloride flush  3 mL Intravenous Q12H   Continuous Infusions:  sodium chloride 50 mL/hr at 05/16/21 6045   sodium chloride     lactated ringers 100 mL/hr at 05/14/21 0501     LOS: 2 days    Time spent: 24 minutes    Kathie Dike, MD [facility]   To contact the attending provider between 7A-7P or the covering provider during after hours 7P-7A, please log into the web site [email or web address] and access using universal [place] password for that web site. If you do not have the password, please call the hospital operator.  05/16/2021, 11:22 PM

## 2021-05-16 NOTE — Progress Notes (Addendum)
Progress Note  Patient Name: Scott Willis Date of Encounter: 05/16/2021  [facility] Cardiologist: Buford Dresser, MD   Subjective   Feeling much better   Inpatient Medications    Scheduled Meds:  feeding supplement (GLUCERNA SHAKE)  237 mL Oral TID BM   finasteride  5 mg Oral Daily   insulin aspart  0-9 Units Subcutaneous TID WC   pantoprazole (PROTONIX) IV  40 mg Intravenous Q12H   sodium chloride flush  3 mL Intravenous Q12H   Continuous Infusions:  sodium chloride 50 mL/hr at 05/16/21 4270   sodium chloride     lactated ringers 100 mL/hr at 05/14/21 0501   PRN Meds: acetaminophen **OR** acetaminophen, diclofenac Sodium, LORazepam, polyvinyl alcohol, prochlorperazine, sodium chloride flush   Vital Signs    Vitals:   05/15/21 1510 05/15/21 2010 05/16/21 0000 05/16/21 0529  BP: (!) 142/79 (!) 160/68 (!) 124/54 (!) 155/69  Pulse:  70 81 63  Resp: 18  18 17   Temp: 98.5 F (36.9 C) 98.1 F (36.7 C) 98.2 F (36.8 C) 98.2 F (36.8 C)  TempSrc: Oral Oral Oral Oral  SpO2:  94% 95% 97%  Weight:      Height:        Intake/Output Summary (Last 24 hours) at 05/16/2021 0740 Last data filed at 05/15/2021 1655 Gross per 24 hour  Intake --  Output 525 ml  Net -525 ml   Last 3 Weights 05/13/2021 11/19/2020 08/20/2020  Weight (lbs) 155 lb 145 lb 6.4 oz 145 lb 6.4 oz  Weight (kg) 70.308 kg 65.953 kg 65.953 kg      Telemetry    AFib w/CVR >>> back in SR 70's-80's, 1st degree and and Mobitz one> pauses 4 second range w/CHB   - Personally Reviewed  ECG    No new EKGs  - Personally Reviewed  Physical Exam   GEN: No acute distress.   Neck: No JVD Cardiac: CRRR, no murmurs, rubs, or gallops.  Respiratory: CTA b/l. GI: Soft, nontender, non-distended  MS: No edema; No deformity. Neuro:  Nonfocal  Psych: Normal affect   Labs    High Sensitivity Troponin:   Recent Labs  Lab 05/13/21 0414 05/13/21 0627  TROPONINIHS 3 4     Chemistry Recent Labs   Lab 05/13/21 0414 05/13/21 0432 05/13/21 0627 05/15/21 0103 05/16/21 0329  NA 136 139  --  139 137  K 3.9 4.0  --  3.2* 4.2  CL 103 104  --  103 107  CO2 25  --   --  26 22  GLUCOSE 216* 220*  --  96 88  BUN 15 12  --  18 26*  CREATININE 1.31* 1.50*  --  1.43* 1.36*  CALCIUM 9.4  --   --  8.4* 7.8*  MG  --   --  2.0 1.6* 2.6*  PROT 6.8  --   --   --   --   ALBUMIN 3.6  --   --   --   --   AST 19  --   --   --   --   ALT 13  --   --   --   --   ALKPHOS 48  --   --   --   --   BILITOT 0.9  --   --   --   --   GFRNONAA 55*  --   --  50* 53*  ANIONGAP 8  --   --  10  8    Lipids  Recent Labs  Lab 05/14/21 0500  CHOL 193  TRIG 76  HDL 46  LDLCALC 132*  CHOLHDL 4.2    Hematology Recent Labs  Lab 05/13/21 0414 05/13/21 0432 05/15/21 0103 05/16/21 0329  WBC 8.9  --  13.6* 11.3*  RBC 4.21*  --  3.96* 3.90*  HGB 12.8* 13.3 11.9* 11.7*  HCT 39.1 39.0 36.5* 35.7*  MCV 92.9  --  92.2 91.5  MCH 30.4  --  30.1 30.0  MCHC 32.7  --  32.6 32.8  RDW 13.4  --  13.2 13.2  PLT 184  --  165 168   Thyroid No results for input(s): TSH, FREET4 in the last 168 hours.  BNPNo results for input(s): BNP, PROBNP in the last 168 hours.  DDimer No results for input(s): DDIMER in the last 168 hours.   Radiology      Cardiac Studies   Echo 05/13/20:  1. Left ventricular ejection fraction, by estimation, is 60 to 65%. The  left ventricle has normal function. The left ventricle has no regional  wall motion abnormalities. Left ventricular diastolic parameters are  consistent with Grade I diastolic  dysfunction (impaired relaxation).   2. Right ventricular systolic function is normal. The right ventricular  size is normal. There is normal pulmonary artery systolic pressure.   3. The mitral valve is normal in structure. Mild mitral valve  regurgitation. No evidence of mitral stenosis.   4. The aortic valve is normal in structure. Aortic valve regurgitation is  trivial. No aortic  stenosis is present.   5. The inferior vena cava is normal in size with greater than 50%  respiratory variability, suggesting right atrial pressure of 3 mmHg.   Patient Profile     81 y.o. male with a hx of HTN, DM, smoker, chronic back pain, HLD, blind L eye, peripheral vision only in his left, unclear mention of possible hx of AF admitted initially to [facility] w/intractable N/V found to have episodes of AV block and pausing, transferred to [facility] for EP/possible pacer.  Assessment & Plan    Symptomatic bradycardia Monitoring from [facility] was reviewed  He initially was having pauses/CHB episodes as long as 7 seconds (while nauseous) with both P-P and PR prolongation > CHB that is c/w vagally mediated. With improvement in his N/V  he  continued intermittent brady, some with P-P and PR prolongation and a couple shorter and without preceding interval prolongatin, though overall improved in that his pausing was not as long.  He was planned for PPM yesterday though as the morning wore on he again was having belching/some retching with PO meds/sips of water causing him to have marked R sided CP  C/w pauses > CHB, though are fairly short 4 seconds and he has been unaware of them at least through the night. Dr. Lovena Le will see later this AM, we will decide on +/- pacer timing   2. Afib w/CVR Back in SR Has hx of the same    3. N/V No ongoing retching/vomiting, only slightly nauseous, R sided CP as well much improved Tolerated some water/drink this AM No belly pain Deferred to IM/attending  On IV protonix now Better today Planned for esophagram today GI on board   4. Hypomag, hypokalemia better   5. R hip wound Biopsy 09/2021 HYPERPLASTIC ACTINIC KERATOSIS WITH HPV RELATED CHANGES   ADDEND: Dr. Lovena Le has seen the patient He has had improvement in his HR and pausing With handgrip/exercise  his HR and conduction improves At this time NOT FELT TO NEED PACEMAKER Advance diet as per  GI/attending Would continue to evaluate his GI symptoms, he mentions unintentional weight loss of about 30 lbs Once GI w/u, procedures completed, will need to be started on [place] if he is agreeable  NO NODAL BLOCKING AGENTS FOR BP MANAGEMENT  For questions or updates, please contact [facility] Please consult [email or web address] for contact info under   Signed, Baldwin Jamaica, PA-C  05/16/2021, 7:40 AM    EP attending  Patient seen and examined.  Agree with the findings as noted above.  His chest pain and difficulty swallowing have improved.  He still has having trouble with solids.  His esophagram was reviewed.  I suspect he has an obstruction in his esophagus which is creating all of the problem.  The patient has not had any more significant bradycardia.  Review of his telemetry strips demonstrates PP prolongation with PR prolongation all consistent with vagal mediation.  In addition, handgrip exercises at the bedside resulted in increase have a sinus rate from 60 beats a minute with's normal conduction to 75 bpm with improved conduction based on a shorter PR interval with physical activity.  With all of this said, there is no indication for permanent pacemaker insertion.  Hopefully his asymptomatic bradycardia associated with chest pain or retching will improve.  A temporary transvenous pacemaker could be placed if the GI service mandates prior to EGD which I think is necessary at this time.  We will follow the patient with you.  Cristopher Peru, MD
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
## 2021-05-16 NOTE — Plan of Care (Signed)
°  Problem: Clinical Measurements: Goal: Will remain free from infection Outcome: Progressing Goal: Cardiovascular complication will be avoided Outcome: Progressing   Problem: Activity: Goal: Risk for activity intolerance will decrease Outcome: Progressing   Problem: Nutrition: Goal: Adequate nutrition will be maintained Outcome: Not Progressing   Problem: Coping: Goal: Level of anxiety will decrease Outcome: Progressing   Problem: Elimination: Goal: Will not experience complications related to urinary retention Outcome: Progressing   Problem: Pain Managment: Goal: General experience of comfort will improve Outcome: Progressing   Problem: Safety: Goal: Ability to remain free from injury will improve Outcome: Progressing   Problem: Skin Integrity: Goal: Risk for impaired skin integrity will decrease Outcome: Progressing

## 2021-05-16 NOTE — H&P (View-Only) (Signed)
Subjective: 81 year old white male who presented with acute dysphagia that started earlier this week.  Prior to this he did not have any problems with his swallowing.  Denies recent inhaler antibiotic use.  He denies use of any new medications either in the ER he was noted to have sinus pauses and a pacemaker has been planned for him.  A barium swallow done today revealed overall reduction esophageal distensibility and caliber with mild relative narrowing along the aortic arch and the GE junction without obstruction with passage of contrast.  The barium pill did not pass t through the mid esophagus in spite of several swallows.  We attempted to give him some Ensure this evening and he continues to have sinus pauses.  He was having problems with complete heart block and episodes of PR prolongation when he was vomiting-this was thought to be vagally mediated with his improvement in nausea and vomiting he continues to be bradycardic with PR prolonged patient. Atrial fibrillation with controlled ventricular rate this morning.PPM planned.  Objective: Vital signs in last 24 hours: Temp:  [98.1 F (36.7 C)-98.8 F (37.1 C)] 98.2 F (36.8 C) (02/03 0529) Pulse Rate:  [63-84] 63 (02/03 0529) Resp:  [17-20] 17 (02/03 0529) BP: (124-160)/(54-79) 155/69 (02/03 0529) SpO2:  [94 %-100 %] 97 % (02/03 0529) Last BM Date: 05/11/21  Intake/Output from previous day: 02/02 0701 - 02/03 0700 In: -  Out: 925 [Urine:925] Intake/Output this shift: No intake/output data recorded.  General appearance: alert, cooperative, appears stated age, and no distress Resp: clear to auscultation bilaterally Cardio: regular rate and rhythm, S1, S2 normal, no murmur, click, rub or gallop GI: soft, non-tender; bowel sounds normal; no masses,  no organomegaly  Lab Results: Recent Labs    05/15/21 0103 05/16/21 0329  WBC 13.6* 11.3*  HGB 11.9* 11.7*  HCT 36.5* 35.7*  PLT 165 168   BMET Recent Labs    05/15/21 0103  05/16/21 0329  NA 139 137  K 3.2* 4.2  CL 103 107  CO2 26 22  GLUCOSE 96 88  BUN 18 26*  CREATININE 1.43* 1.36*  CALCIUM 8.4* 7.8*    Studies/Results: No results found.  Medications: I have reviewed the patient's current medications. Prior to Admission:  Medications Prior to Admission  Medication Sig Dispense Refill Last Dose   Cholecalciferol (VITAMIN D-3) 125 MCG (5000 UT) TABS Take 5,000 Units by mouth daily.   05/12/2021   diazepam (VALIUM) 5 MG tablet Take 5 mg by mouth See admin instructions. Take 5 mg by mouth as directed before right eye injection every 10 weeks   05/09/2021   diclofenac sodium (VOLTAREN) 1 % GEL Apply 4 g topically 4 (four) times daily as needed. (Patient taking differently: Apply 4 g topically 4 (four) times daily as needed (for pain).) 500 g 6 unk   finasteride (PROSCAR) 5 MG tablet TAKE 1 TABLET BY MOUTH EVERY DAY (Patient taking differently: Take 5 mg by mouth daily.) 90 tablet 3 05/12/2021   JANUVIA 100 MG tablet TAKE 1 TABLET BY MOUTH EVERY DAY (Patient taking differently: Take 100 mg by mouth daily.) 90 tablet 1 05/12/2021   Magnesium Oxide 400 (240 Mg) MG TABS TAKE 1 TABLET BY MOUTH EVERY DAY (Patient taking differently: Take 400 mg by mouth daily.) 90 tablet 3 05/12/2021   Multiple Vitamins-Minerals (PRESERVISION AREDS 2 PO) Take 1 capsule by mouth daily.   05/12/2021   mupirocin ointment (BACTROBAN) 2 % Apply 1 application topically 2 (two) times daily. (Patient taking  differently: Apply 1 application topically See admin instructions. Apply to the right hip once a day for wound care) 22 g 1 05/12/2021   oxyCODONE-acetaminophen (PERCOCET/ROXICET) 5-325 MG tablet Take 1-2 tablets by mouth every 6 (six) hours as needed for severe pain. (Patient taking differently: Take 1 tablet by mouth See admin instructions. Take 1 tablet by mouth as directed before right eye injection every 10 weeks) 30 tablet 0 Past Week   SYSTANE COMPLETE 0.6 % SOLN Place 1 drop into both  eyes 3 (three) times daily as needed (for irritation).   unk   tamsulosin (FLOMAX) 0.4 MG CAPS capsule TAKE 1 CAPSULE BY MOUTH EVERY DAY (Patient taking differently: Take 0.4 mg by mouth daily.) 90 capsule 3 05/12/2021   trimethoprim-polymyxin b (POLYTRIM) ophthalmic solution Place 1 drop into the right eye See admin instructions. Instill 1 drop into the right eye four times a day for 2 days after eye injection every 10 weeks   05/11/2021   blood glucose meter kit and supplies KIT Check CBG QID 1 each 0    polyethylene glycol (GOLYTELY) 236 g solution Use once daily until bowels are moving normally. (Patient not taking: Reported on 05/13/2021) 4000 mL 11 Not Taking   Prodigy Twist Top Lancets 28G MISC       venlafaxine (EFFEXOR) 75 MG tablet Take 1 tablet (75 mg total) by mouth 2 (two) times daily. (Patient not taking: Reported on 05/13/2021) 180 tablet 1 Not Taking   Scheduled:  feeding supplement (GLUCERNA SHAKE)  237 mL Oral TID BM   finasteride  5 mg Oral Daily   insulin aspart  0-9 Units Subcutaneous TID WC   pantoprazole (PROTONIX) IV  40 mg Intravenous Q12H   sodium chloride flush  3 mL Intravenous Q12H    Assessment/Plan: CHB/Sinus pauses/syncope associated with swallowing-barium pill did not pass the mid esophagus and therefore he will benefit from an EGD. This might be safer after the PPM has been placed. Dr. Christia Reading with the [facility] is covering over the weekend. Please call her if there are any questions or concerns 2) AODM/HTH/Hyperlipidemia. 3) Tobacco abuse. 4) Chronic back pain.  LOS: 2 days   Juanita Craver 05/16/2021, 7:09 AM
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
## 2021-05-17 DIAGNOSIS — R930 Abnormal findings on diagnostic imaging of skull and head, not elsewhere classified: Secondary | ICD-10-CM | POA: Diagnosis not present

## 2021-05-17 DIAGNOSIS — I455 Other specified heart block: Secondary | ICD-10-CM | POA: Diagnosis not present

## 2021-05-17 DIAGNOSIS — I10 Essential (primary) hypertension: Secondary | ICD-10-CM | POA: Diagnosis not present

## 2021-05-17 DIAGNOSIS — N400 Enlarged prostate without lower urinary tract symptoms: Secondary | ICD-10-CM | POA: Diagnosis not present

## 2021-05-17 LAB — GLUCOSE, CAPILLARY
Glucose-Capillary: 91 mg/dL (ref 70–99)
Glucose-Capillary: 83 mg/dL (ref 70–99)
Glucose-Capillary: 78 mg/dL (ref 70–99)
Glucose-Capillary: 135 mg/dL — ABNORMAL HIGH (ref 70–99)
Glucose-Capillary: 60 mg/dL — ABNORMAL LOW (ref 70–99)

## 2021-05-17 MED ORDER — DEXTROSE 50 % IV SOLN
12.5000 g | INTRAVENOUS | Status: AC
  Administered 2021-05-17: 12.5 g via INTRAVENOUS
  Filled 2021-05-17: qty 50

## 2021-05-17 MED ORDER — HYDROCORTISONE 1 % EX CREA
TOPICAL_CREAM | Freq: Every day | CUTANEOUS | Status: DC | PRN
  Filled 2021-05-17: qty 28

## 2021-05-17 NOTE — Progress Notes (Signed)
Physical Therapy Treatment Patient Details Name: Scott Willis MRN: [id number] DOB: [birth year] Today's Date: 05/17/2021   History of Present Illness 81 y.o. male presenting to [facility] 1/31 with intractableN/V x1 day. MRI brain shows no acute abnormalities, small remote L pons infarct.. Patient admitted with symtomatic bradycardia with pauses as long as 7 seconds. Initially planned for PPM but per GI, pt with possible "swallow syncope" which is likely cause of pauses. Cardiology does not plan for PPM at this time with further GI work-up pending. PMHx signficant for OA, HTN, L eye blindness, lumbago, BPH, DMII, HLD, nephrolithiasis, and osteoporosis.    PT Comments    The pt was seen for session focused on exercise training and safe mobility as RN asked to defer further ambulation due to continued missed beats earlier this morning. The pt is in worse spirits today, spent most of session lamenting and venting regarding hospital course and lack of answers. The pt was able to complete sit-stand and pivot transfers without physical assist or AD and completed 18 sit-stands in 60 seconds with all VSS. The pt was then instructed in safe exercises to complete in seated or supine positions in bed to maintain some mobility and strength while other mobility is limited. Will continue to benefit from skilled PT acutely but expect quick return to independence once allowed to mobilize freely.    Recommendations for follow up therapy are one component of a multi-disciplinary discharge planning process, led by the attending physician.  Recommendations may be updated based on patient status, additional functional criteria and insurance authorization.  Follow Up Recommendations  No PT follow up     Assistance Recommended at Discharge PRN  Patient can return home with the following Assist for transportation   Equipment Recommendations  None recommended by PT    Recommendations for Other Services        Precautions / Restrictions Precautions Precautions: Fall Precaution Comments: monitor HR (hx of pauses), BP (elevated); legally blind Restrictions Weight Bearing Restrictions: No     Mobility  Bed Mobility Overal bed mobility: Modified Independent             General bed mobility comments: HOB elevated    Transfers Overall transfer level: Independent Equipment used: None               General transfer comment: limtied to pivot transfer today due to RN concerns for dropped beats earlier in day.          Balance Overall balance assessment: Mild deficits observed, not formally tested                                          Cognition Arousal/Alertness: Awake/alert Behavior During Therapy: WFL for tasks assessed/performed Overall Cognitive Status: Within Functional Limits for tasks assessed                                 General Comments: very pleasant, tangential conversation and hyperverbose at times        Exercises General Exercises - Lower Extremity Long Arc Quad: Strengthening, Both, 15 reps, Seated (with weight of other leg for resistance) Hip ABduction/ADduction: AROM, Both, 10 reps, Seated (with SLR) Hip Flexion/Marching: AROM, Both, 10 reps, Seated Heel Raises: AROM, Both, 10 reps, Seated Other Exercises Other Exercises: repeated sit-stand for 1 min.  completed 18    General Comments General comments (skin integrity, edema, etc.): VSS on RA, no pauses during session      Pertinent Vitals/Pain Pain Assessment Pain Assessment: No/denies pain     PT Goals (current goals can now be found in the care plan section) Acute Rehab PT Goals Patient Stated Goal: return home asap PT Goal Formulation: With patient Time For Goal Achievement: 05/30/21 Potential to Achieve Goals: Good Progress towards PT goals:  (limited by mobility restrictions)    Frequency    Min 2X/week      PT Plan Current plan remains  appropriate       AM-PAC PT "6 Clicks" Mobility   Outcome Measure  Help needed turning from your back to your side while in a flat bed without using bedrails?: None Help needed moving from lying on your back to sitting on the side of a flat bed without using bedrails?: None Help needed moving to and from a bed to a chair (including a wheelchair)?: None Help needed standing up from a chair using your arms (e.g., wheelchair or bedside chair)?: None Help needed to walk in hospital room?: A Little Help needed climbing 3-5 steps with a railing? : A Little 6 Click Score: 22    End of Session   Activity Tolerance: Patient tolerated treatment well Patient left: with call bell/phone within reach;in chair Nurse Communication: Mobility status PT Visit Diagnosis: Unsteadiness on feet (R26.81);Other abnormalities of gait and mobility (R26.89)     Time: 6468-0321 PT Time Calculation (min) (ACUTE ONLY): 37 min  Charges:  $Therapeutic Exercise: 23-37 mins                     West Carbo, PT, DPT   Acute Rehabilitation Department Pager #: [phone]   Sandra Cockayne 05/17/2021, 4:25 PM

## 2021-05-17 NOTE — Progress Notes (Signed)
Progress Note  Patient Name: Scott Willis Date of Encounter: 05/17/2021  [facility] Cardiologist: Buford Dresser, MD \  Patient Profile     81 y.o. male with a hx of HTN, DM, smoker, chronic back pain, HLD, blind L eye, peripheral vision only in his left,  AF admitted initially to [facility] w/intractable N/V found to have episodes of AV block and pausing, transferred to [facility] for EP/possible pacer. No indication for permanent pacing per GT  AVblock thought 2/2 hypervagatonia related to retching and esophogram concerning for obstruction with evaluation pending   Thromboembolic risk factors ( age  -2, HTN-1) for a CHADSVASc Score of >= 3; disucssions re anticoagulation in the past , and currently will hold off on discussions pending workup    Subjective    No chest pain Frustrated at slowness of plan Not eating   Inpatient Medications    Scheduled Meds:  feeding supplement (GLUCERNA SHAKE)  237 mL Oral TID BM   finasteride  5 mg Oral Daily   insulin aspart  0-9 Units Subcutaneous TID WC   pantoprazole (PROTONIX) IV  40 mg Intravenous Q12H   sodium chloride flush  3 mL Intravenous Q12H   Continuous Infusions:  sodium chloride 50 mL/hr at 05/17/21 0640   sodium chloride     PRN Meds: acetaminophen **OR** acetaminophen, diclofenac Sodium, LORazepam, polyvinyl alcohol, prochlorperazine, sodium chloride flush   Vital Signs    Vitals:   05/16/21 2105 05/16/21 2110 05/17/21 0647 05/17/21 0742  BP: 138/69 (!) 154/67 (!) 164/66 (!) 133/57  Pulse: 65 62 61 (!) 56  Resp:   18 15  Temp:   98.3 F (36.8 C) 97.9 F (36.6 C)  TempSrc:   Oral Oral  SpO2: 94% 92% 96% 97%  Weight:      Height:        Intake/Output Summary (Last 24 hours) at 05/17/2021 0957 Last data filed at 05/17/2021 3810 Gross per 24 hour  Intake 1572.94 ml  Output 1350 ml  Net 222.94 ml    Last 3 Weights 05/13/2021 11/19/2020 08/20/2020  Weight (lbs) 155 lb 145 lb 6.4 oz 145 lb 6.4 oz  Weight (kg)  70.308 kg 65.953 kg 65.953 kg      Telemetry    AFib w/CVR >>> back in SR 70's-80's, 1st degree and and Mobitz one> pauses 4 second range w/CHB   - Personally Reviewed  ECG    No new EKGs  - Personally Reviewed  Physical Exam   Well developed and nourished in no acute distress HENT normal Neck supple with JVP-  flat   Clear Regular rate and rhythm, no murmurs or gallops Abd-soft with active BS No Clubbing cyanosis edema Skin-warm and dry A & Oriented  Grossly normal sensory and motor function  E    Labs    High Sensitivity Troponin:   Recent Labs  Lab 05/13/21 0414 05/13/21 0627  TROPONINIHS 3 4      Chemistry Recent Labs  Lab 05/13/21 0414 05/13/21 0432 05/13/21 0627 05/15/21 0103 05/16/21 0329  NA 136 139  --  139 137  K 3.9 4.0  --  3.2* 4.2  CL 103 104  --  103 107  CO2 25  --   --  26 22  GLUCOSE 216* 220*  --  96 88  BUN 15 12  --  18 26*  CREATININE 1.31* 1.50*  --  1.43* 1.36*  CALCIUM 9.4  --   --  8.4* 7.8*  MG  --   --  2.0 1.6* 2.6*  PROT 6.8  --   --   --   --   ALBUMIN 3.6  --   --   --   --   AST 19  --   --   --   --   ALT 13  --   --   --   --   ALKPHOS 48  --   --   --   --   BILITOT 0.9  --   --   --   --   GFRNONAA 55*  --   --  50* 53*  ANIONGAP 8  --   --  10 8     Lipids  Recent Labs  Lab 05/14/21 0500  CHOL 193  TRIG 76  HDL 46  LDLCALC 132*  CHOLHDL 4.2     Hematology Recent Labs  Lab 05/13/21 0414 05/13/21 0432 05/15/21 0103 05/16/21 0329  WBC 8.9  --  13.6* 11.3*  RBC 4.21*  --  3.96* 3.90*  HGB 12.8* 13.3 11.9* 11.7*  HCT 39.1 39.0 36.5* 35.7*  MCV 92.9  --  92.2 91.5  MCH 30.4  --  30.1 30.0  MCHC 32.7  --  32.6 32.8  RDW 13.4  --  13.2 13.2  PLT 184  --  165 168    Thyroid No results for input(s): TSH, FREET4 in the last 168 hours.  BNPNo results for input(s): BNP, PROBNP in the last 168 hours.  DDimer No results for input(s): DDIMER in the last 168 hours.   Radiology      Cardiac Studies    Echo 05/13/20:  1. Left ventricular ejection fraction, by estimation, is 60 to 65%. The  left ventricle has normal function. The left ventricle has no regional  wall motion abnormalities. Left ventricular diastolic parameters are  consistent with Grade I diastolic  dysfunction (impaired relaxation).   2. Right ventricular systolic function is normal. The right ventricular  size is normal. There is normal pulmonary artery systolic pressure.   3. The mitral valve is normal in structure. Mild mitral valve  regurgitation. No evidence of mitral stenosis.   4. The aortic valve is normal in structure. Aortic valve regurgitation is  trivial. No aortic stenosis is present.   5. The inferior vena cava is normal in size with greater than 50%  respiratory variability, suggesting right atrial pressure of 3 mmHg.     Assessment & Plan    Sinus bradycardia and concomitant heart block 2/2 hypervagatonia  This is not a cardiac issue but 2/2 vagal stimulation from the GI tract and the goals of therapy are to relieve that-- pacing is NOT indicated and GT has offered perhaps temporary support for endoscopy  EP will see on Monday   Signed, Virl Axe, MD  05/17/2021, 9:57 AM

## 2021-05-17 NOTE — Progress Notes (Signed)
PROGRESS NOTE    Scott Willis  NFA:[id number] DOB: 08-Oct-1940 DOA: 05/13/2021 PCP: Eunice Blase, MD    Chief Complaint  Patient presents with   Emesis    Brief Narrative:  Patient is 81 year old gentleman history of osteoarthritis, hypertension, right eye blepharitis, blindness, BPH, type 2 diabetes, hyperlipidemia, nephrolithiasis, hypogonadism, osteoporosis in the setting of androgen replacement therapy, gastric ulcer, near syncopal episode presented to the ED with persistent nausea, multiple episodes of emesis.  Patient denied any diarrhea constipation melena hematochezia.  Patient denied any urinary symptoms.  Patient noted in the ED to have some sinus pauses.  Patient also noted to have some positional lightheadedness but no chest pain palpitations or diaphoresis.  Patient seen in the ED COVID-19 PCR negative.  Influenza PCR negative.  Cardiac enzymes normal.  Urinalysis with some glycosuria.  Chest x-ray done no acute cardiopulmonary disease.  Head CT showed hypodensity in the left aspect of the pons suspicious for infarct new since 2017, MRI brain done with small remote infarct in the left pons.  Patient noted to have ongoing multiple pauses.  Patient seen in consultation by cardiology who recommended transfer to [facility] for evaluation by EP and possible PPM placement.  2D echo done.   Assessment & Plan:   Principal Problem:   Sinus pause Active Problems:   Benign essential hypertension   BPH (benign prostatic hyperplasia)   Type 2 diabetes mellitus (HCC)   Tobacco use disorder   Depression   Near syncope   Gastro-esophageal reflux disease without esophagitis   Stage 3a chronic kidney disease (CKD) (HCC)   Nausea and vomiting   Borderline prolonged QT interval   Abnormal head CT   Protein-calorie malnutrition, severe  #1 sinus pauses noted on telemetry -Patient noted to have multiple sinus pauses since admission noted on telemetry with some intermittent  occasional odynophagia noted by evaluation by speech therapy. -Patient noted to have lightheadedness, denies any syncopal episodes. -Patient noted to have multiple sinus pauses greater than 5 seconds. -2D echo done with EF of 60 to 46%,NGEX, grade 1 diastolic dysfunction, normal right ventricular systolic function.  No significant valvular abnormalities. -Patient seen in consultation by cardiology who are recommending transfer to [facility] for evaluation by EP  -Pacer pads at bedside. -seen by EP and it was not felt that permanent pacemaker placement was indicated -per EP, a temporary transvenous pacer could be placed if GI feels needed prior to attempting EGD  2.  Abnormal head CT/remote infarct -Patient presented with questionable slurred speech per admitting physician, some dizziness, nausea or emesis. -CT head done with age-indeterminate CVA noted. -MRI brain done with remote infarct noted in the left pons region. -Carotid Dopplers with no significant ICA stenosis. -2D echo with no embolic source is noted. -PT/OT/ST. -discussed with Neurology, Dr. Lorrin Goodell who felt MRI findings were chronic -recommendations were for risk factor modification -would benefit from anticoagulation and statin on discharge  3.  Intractable nausea vomiting with chest discomfort -appears to have started rather suddenly -Placed on PPI. -GI following -esophagram esophagram showed mild relative areas of narrowing at GE junction. Barium pill did not pass to the air level of the mid  -he is still unable to tolerate any po intake  4.  Borderline prolonged QT interval -Avoid QT prolonging medications. -Repeat EKG with QTC of 509. -Replete electrolytes keep potassium approximately 4, magnesium greater than 2. -Cardiology following.  5.  Odynophagia -Patient with complaints of difficulty swallowing which was noted by  speech therapy associated with oral intake. -appreciate GI input -esophagram showed  mild relative areas of narrowing at GE junction. Barium pill did not pass to the air level of the mid esophagus -Placed on IV PPI -Appears that he would need further work-up with EGD, await further input from GI  6.  Hypertension -Permissive hypertension. -Once tolerating oral intake can start on oral antihypertensive for better blood pressure management. -Cardiology following.  7.  Tobacco use disorder -Tobacco cessation.  8.  BPH -Resume home regimen Proscar.  9.  GERD -IV PPI.  10.  Stage IIIa CKD -Stable.  11.  Diabetes mellitus type 2 -Hemoglobin A1c 6.1 (05/14/2021) -He is on Januvia as outpatient -currently on SSI  12. Hypokalemia/Hypomagnesemia -replaced  13. Paroxysmal A fib -Patient reports history of the same -review of cardiology notes indicate that he did not want anticoagulation in the past -CHADSVASC of 6 (age(2), diabetes(1), hypertension(1), evidence of stroke(2)) -discussed anticoagulation with patient and he will consider it prior to discharge.  DVT prophylaxis: SCDs Code Status: Full Family Communication: Updated patient.  No family at bedside. Disposition:   Status is: Inpatient The patient will require care spanning > 2 midnights and should be moved to inpatient because: continue management of arrythmias and nausea/vomiting  Planned Discharge Destination: To be determined        Consultants:  Cardiology: Dr. Gardiner Rhyme 05/13/2021 GI, Dr, Benson [place] EP Dr. Lovena Le  Procedures: 2D echo 05/13/2021 Carotid Dopplers 05/13/2021 MRI brain 05/13/2021 Chest x-ray 05/13/2021 CT head without contrast 05/13/2021      Antimicrobials: None   Subjective: He feels frustrated that he cannot eat and drink.  He is asking when/if endoscopy will be done.  Still becoming nauseous and gagging with p.o. intake.  There is also associated with chest pain.  Objective: Vitals:   05/17/21 0742 05/17/21 1323 05/17/21 1623 05/17/21 2041  BP: (!) 133/57 (!) 171/66 (!)  160/60 (!) 189/56  Pulse: (!) 56 (!) 55 61 (!) 59  Resp: 15 15 16 15   Temp: 97.9 F (36.6 C) 98.7 F (37.1 C) 98 F (36.7 C) 97.7 F (36.5 C)  TempSrc: Oral Oral Oral Oral  SpO2: 97% 99% 100% 98%  Weight:      Height:        Intake/Output Summary (Last 24 hours) at 05/17/2021 2109 Last data filed at 05/17/2021 1900 Gross per 24 hour  Intake 1572.94 ml  Output 950 ml  Net 622.94 ml   Filed Weights   05/13/21 0517  Weight: 70.3 kg    Examination:  General exam: Alert, awake, oriented x 3 Respiratory system: Clear to auscultation. Respiratory effort normal. Cardiovascular system:RRR. No murmurs, rubs, gallops. Gastrointestinal system: Abdomen is nondistended, soft and nontender. No organomegaly or masses felt. Normal bowel sounds heard. Central nervous system: Alert and oriented. No focal neurological deficits. Extremities: No C/C/E, +pedal pulses Skin: No rashes, lesions or ulcers Psychiatry: Judgement and insight appear normal. Mood & affect appropriate.       Data Reviewed: I have personally reviewed following labs and imaging studies  CBC: Recent Labs  Lab 05/13/21 0414 05/13/21 0432 05/15/21 0103 05/16/21 0329  WBC 8.9  --  13.6* 11.3*  NEUTROABS 6.1  --  9.9*  --   HGB 12.8* 13.3 11.9* 11.7*  HCT 39.1 39.0 36.5* 35.7*  MCV 92.9  --  92.2 91.5  PLT 184  --  165 683    Basic Metabolic Panel: Recent Labs  Lab 05/13/21 0414 05/13/21 0432 05/13/21 0627 05/15/21  0103 05/16/21 0329  NA 136 139  --  139 137  K 3.9 4.0  --  3.2* 4.2  CL 103 104  --  103 107  CO2 25  --   --  26 22  GLUCOSE 216* 220*  --  96 88  BUN 15 12  --  18 26*  CREATININE 1.31* 1.50*  --  1.43* 1.36*  CALCIUM 9.4  --   --  8.4* 7.8*  MG  --   --  2.0 1.6* 2.6*    GFR: Estimated Creatinine Clearance: 43.1 mL/min (A) (by C-G formula based on SCr of 1.36 mg/dL (H)).  Liver Function Tests: Recent Labs  Lab 05/13/21 0414  AST 19  ALT 13  ALKPHOS 48  BILITOT 0.9  PROT 6.8   ALBUMIN 3.6    CBG: Recent Labs  Lab 05/16/21 1614 05/16/21 2227 05/17/21 0745 05/17/21 1120 05/17/21 1628  GLUCAP 89 87 91 83 78     Recent Results (from the past 240 hour(s))  Resp Panel by RT-PCR (Flu A&B, Covid) Nasopharyngeal Swab     Status: None   Collection Time: 05/13/21  4:11 AM   Specimen: Nasopharyngeal Swab; Nasopharyngeal(NP) swabs in vial transport medium  Result Value Ref Range Status   SARS Coronavirus 2 by RT PCR NEGATIVE NEGATIVE Final    Comment: (NOTE) SARS-CoV-2 target nucleic acids are NOT DETECTED.  The SARS-CoV-2 RNA is generally detectable in upper respiratory specimens during the acute phase of infection. The lowest concentration of SARS-CoV-2 viral copies this assay can detect is 138 copies/mL. A negative result does not preclude SARS-Cov-2 infection and should not be used as the sole basis for treatment or other patient management decisions. A negative result may occur with  improper specimen collection/handling, submission of specimen other than nasopharyngeal swab, presence of viral mutation(s) within the areas targeted by this assay, and inadequate number of viral copies(<138 copies/mL). A negative result must be combined with clinical observations, patient history, and epidemiological information. The expected result is Negative.  Fact Sheet for Patients:  EntrepreneurPulse.com.au  Fact Sheet for Healthcare Providers:  IncredibleEmployment.be  This test is no t yet approved or cleared by the [place] FDA and  has been authorized for detection and/or diagnosis of SARS-CoV-2 by FDA under an Emergency Use Authorization (EUA). This EUA will remain  in effect (meaning this test can be used) for the duration of the COVID-19 declaration under Section 564(b)(1) of the Act, 21 U.S.C.section 360bbb-3(b)(1), unless the authorization is terminated  or revoked sooner.       Influenza A by PCR NEGATIVE  NEGATIVE Final   Influenza B by PCR NEGATIVE NEGATIVE Final    Comment: (NOTE) The Xpert Xpress SARS-CoV-2/FLU/RSV plus assay is intended as an aid in the diagnosis of influenza from Nasopharyngeal swab specimens and should not be used as a sole basis for treatment. Nasal washings and aspirates are unacceptable for Xpert Xpress SARS-CoV-2/FLU/RSV testing.  Fact Sheet for Patients: EntrepreneurPulse.com.au  Fact Sheet for Healthcare Providers: IncredibleEmployment.be  This test is not yet approved or cleared by the [place] FDA and has been authorized for detection and/or diagnosis of SARS-CoV-2 by FDA under an Emergency Use Authorization (EUA). This EUA will remain in effect (meaning this test can be used) for the duration of the COVID-19 declaration under Section 564(b)(1) of the Act, 21 U.S.C. section 360bbb-3(b)(1), unless the authorization is terminated or revoked.  Performed at [facility], [place] [address] [place], [place] [address]  Surgical PCR screen     Status: None   Collection Time: 05/15/21  6:04 AM   Specimen: Nasal Mucosa; Nasal Swab  Result Value Ref Range Status   MRSA, PCR NEGATIVE NEGATIVE Final   Staphylococcus aureus NEGATIVE NEGATIVE Final    Comment: (NOTE) The Xpert SA Assay (FDA approved for NASAL specimens in patients 81 years of age and older), is one component of a comprehensive surveillance program. It is not intended to diagnose infection nor to guide or monitor treatment. Performed at [facility], [place] [address], [place], [place] [address]          Radiology Studies: DG ESOPHAGUS W SINGLE CM (SOL OR THIN BA)  Result Date: 05/16/2021 CLINICAL DATA:  Odynophagia; chest pain and vomiting with drinking and eating, difficulty with pills EXAM: ESOPHOGRAM/BARIUM SWALLOW TECHNIQUE: Single contrast examination was performed using thin barium. FLUOROSCOPY: Radiation Exposure Index  (as provided by the fluoroscopic device): 1372.6 uGy*m2 DAP COMPARISON:  None. FINDINGS: Unremarkable hypopharyngeal contours. No aspiration. Overall esophageal distensibility and caliber appears reduced. There are mild relative areas of narrowing along the aortic arch and at the gastroesophageal junction. Disc does not prevent passage of contrast. There is no significant dysmotility. A small hiatal hernia is present. No significant gastroesophageal reflux during the course of the study. Barium pill did not pass through the mid esophagus despite successive swallows of contrast. IMPRESSION: Question overall reduction in esophageal distensibility and caliber. There is no comparison available. Mild relative areas of narrowing along the aortic arch and at the gastroesophageal junction without obstruction to passage of contrast. The barium pill did not pass to the air level of the mid esophagus. Small hiatal hernia. Patient reported pain with swallowing during examination. Electronically Signed   By: Macy Mis M.D.   On: 05/16/2021 09:58        Scheduled Meds:  feeding supplement (GLUCERNA SHAKE)  237 mL Oral TID BM   finasteride  5 mg Oral Daily   insulin aspart  0-9 Units Subcutaneous TID WC   pantoprazole (PROTONIX) IV  40 mg Intravenous Q12H   sodium chloride flush  3 mL Intravenous Q12H   Continuous Infusions:  sodium chloride 50 mL/hr at 05/17/21 0640   sodium chloride       LOS: 3 days    Time spent: 45 minutes    Kathie Dike, MD [facility]   To contact the attending provider between 7A-7P or the covering provider during after hours 7P-7A, please log into the web site [email or web address] and access using universal [place] password for that web site. If you do not have the password, please call the hospital operator.  05/17/2021, 9:09 PM

## 2021-05-17 NOTE — H&P (View-Only) (Signed)
Progress Note  Patient Name: Scott Willis Date of Encounter: 05/17/2021  [facility] Cardiologist: Buford Dresser, MD \  Patient Profile     81 y.o. male with a hx of HTN, DM, smoker, chronic back pain, HLD, blind L eye, peripheral vision only in his left,  AF admitted initially to [facility] w/intractable N/V found to have episodes of AV block and pausing, transferred to [facility] for EP/possible pacer. No indication for permanent pacing per GT  AVblock thought 2/2 hypervagatonia related to retching and esophogram concerning for obstruction with evaluation pending   Thromboembolic risk factors ( age  -2, HTN-1) for a CHADSVASc Score of >= 3; disucssions re anticoagulation in the past , and currently will hold off on discussions pending workup    Subjective    No chest pain Frustrated at slowness of plan Not eating   Inpatient Medications    Scheduled Meds:  feeding supplement (GLUCERNA SHAKE)  237 mL Oral TID BM   finasteride  5 mg Oral Daily   insulin aspart  0-9 Units Subcutaneous TID WC   pantoprazole (PROTONIX) IV  40 mg Intravenous Q12H   sodium chloride flush  3 mL Intravenous Q12H   Continuous Infusions:  sodium chloride 50 mL/hr at 05/17/21 0640   sodium chloride     PRN Meds: acetaminophen **OR** acetaminophen, diclofenac Sodium, LORazepam, polyvinyl alcohol, prochlorperazine, sodium chloride flush   Vital Signs    Vitals:   05/16/21 2105 05/16/21 2110 05/17/21 0647 05/17/21 0742  BP: 138/69 (!) 154/67 (!) 164/66 (!) 133/57  Pulse: 65 62 61 (!) 56  Resp:   18 15  Temp:   98.3 F (36.8 C) 97.9 F (36.6 C)  TempSrc:   Oral Oral  SpO2: 94% 92% 96% 97%  Weight:      Height:        Intake/Output Summary (Last 24 hours) at 05/17/2021 0957 Last data filed at 05/17/2021 4193 Gross per 24 hour  Intake 1572.94 ml  Output 1350 ml  Net 222.94 ml    Last 3 Weights 05/13/2021 11/19/2020 08/20/2020  Weight (lbs) 155 lb 145 lb 6.4 oz 145 lb 6.4 oz  Weight (kg)  70.308 kg 65.953 kg 65.953 kg      Telemetry    AFib w/CVR >>> back in SR 70's-80's, 1st degree and and Mobitz one> pauses 4 second range w/CHB   - Personally Reviewed  ECG    No new EKGs  - Personally Reviewed  Physical Exam   Well developed and nourished in no acute distress HENT normal Neck supple with JVP-  flat   Clear Regular rate and rhythm, no murmurs or gallops Abd-soft with active BS No Clubbing cyanosis edema Skin-warm and dry A & Oriented  Grossly normal sensory and motor function  E    Labs    High Sensitivity Troponin:   Recent Labs  Lab 05/13/21 0414 05/13/21 0627  TROPONINIHS 3 4      Chemistry Recent Labs  Lab 05/13/21 0414 05/13/21 0432 05/13/21 0627 05/15/21 0103 05/16/21 0329  NA 136 139  --  139 137  K 3.9 4.0  --  3.2* 4.2  CL 103 104  --  103 107  CO2 25  --   --  26 22  GLUCOSE 216* 220*  --  96 88  BUN 15 12  --  18 26*  CREATININE 1.31* 1.50*  --  1.43* 1.36*  CALCIUM 9.4  --   --  8.4* 7.8*  MG  --   --  2.0 1.6* 2.6*  PROT 6.8  --   --   --   --   ALBUMIN 3.6  --   --   --   --   AST 19  --   --   --   --   ALT 13  --   --   --   --   ALKPHOS 48  --   --   --   --   BILITOT 0.9  --   --   --   --   GFRNONAA 55*  --   --  50* 53*  ANIONGAP 8  --   --  10 8     Lipids  Recent Labs  Lab 05/14/21 0500  CHOL 193  TRIG 76  HDL 46  LDLCALC 132*  CHOLHDL 4.2     Hematology Recent Labs  Lab 05/13/21 0414 05/13/21 0432 05/15/21 0103 05/16/21 0329  WBC 8.9  --  13.6* 11.3*  RBC 4.21*  --  3.96* 3.90*  HGB 12.8* 13.3 11.9* 11.7*  HCT 39.1 39.0 36.5* 35.7*  MCV 92.9  --  92.2 91.5  MCH 30.4  --  30.1 30.0  MCHC 32.7  --  32.6 32.8  RDW 13.4  --  13.2 13.2  PLT 184  --  165 168    Thyroid No results for input(s): TSH, FREET4 in the last 168 hours.  BNPNo results for input(s): BNP, PROBNP in the last 168 hours.  DDimer No results for input(s): DDIMER in the last 168 hours.   Radiology      Cardiac Studies    Echo 05/13/20:  1. Left ventricular ejection fraction, by estimation, is 60 to 65%. The  left ventricle has normal function. The left ventricle has no regional  wall motion abnormalities. Left ventricular diastolic parameters are  consistent with Grade I diastolic  dysfunction (impaired relaxation).   2. Right ventricular systolic function is normal. The right ventricular  size is normal. There is normal pulmonary artery systolic pressure.   3. The mitral valve is normal in structure. Mild mitral valve  regurgitation. No evidence of mitral stenosis.   4. The aortic valve is normal in structure. Aortic valve regurgitation is  trivial. No aortic stenosis is present.   5. The inferior vena cava is normal in size with greater than 50%  respiratory variability, suggesting right atrial pressure of 3 mmHg.     Assessment & Plan    Sinus bradycardia and concomitant heart block 2/2 hypervagatonia  This is not a cardiac issue but 2/2 vagal stimulation from the GI tract and the goals of therapy are to relieve that-- pacing is NOT indicated and GT has offered perhaps temporary support for endoscopy  EP will see on Monday   Signed, Virl Axe, MD  05/17/2021, 9:57 AM

## 2021-05-17 NOTE — Plan of Care (Signed)
°  Problem: Education: Goal: Knowledge of General Education information will improve Description: Including pain rating scale, medication(s)/side effects and non-pharmacologic comfort measures Outcome: Progressing   Problem: Clinical Measurements: Goal: Cardiovascular complication will be avoided Outcome: Progressing   Problem: Activity: Goal: Risk for activity intolerance will decrease Outcome: Progressing   Problem: Coping: Goal: Level of anxiety will decrease Outcome: Progressing

## 2021-05-18 DIAGNOSIS — R930 Abnormal findings on diagnostic imaging of skull and head, not elsewhere classified: Secondary | ICD-10-CM | POA: Diagnosis not present

## 2021-05-18 DIAGNOSIS — I10 Essential (primary) hypertension: Secondary | ICD-10-CM | POA: Diagnosis not present

## 2021-05-18 DIAGNOSIS — N400 Enlarged prostate without lower urinary tract symptoms: Secondary | ICD-10-CM | POA: Diagnosis not present

## 2021-05-18 DIAGNOSIS — I455 Other specified heart block: Secondary | ICD-10-CM | POA: Diagnosis not present

## 2021-05-18 LAB — GLUCOSE, CAPILLARY
Glucose-Capillary: 75 mg/dL (ref 70–99)
Glucose-Capillary: 83 mg/dL (ref 70–99)
Glucose-Capillary: 150 mg/dL — ABNORMAL HIGH (ref 70–99)
Glucose-Capillary: 148 mg/dL — ABNORMAL HIGH (ref 70–99)

## 2021-05-18 LAB — CBC
HCT: 33.7 % — ABNORMAL LOW (ref 39.0–52.0)
Hemoglobin: 11.2 g/dL — ABNORMAL LOW (ref 13.0–17.0)
MCH: 30.5 pg (ref 26.0–34.0)
MCHC: 33.2 g/dL (ref 30.0–36.0)
MCV: 91.8 fL (ref 80.0–100.0)
Platelets: 155 10*3/uL (ref 150–400)
RBC: 3.67 MIL/uL — ABNORMAL LOW (ref 4.22–5.81)
RDW: 13 % (ref 11.5–15.5)
WBC: 7 10*3/uL (ref 4.0–10.5)
nRBC: 0 % (ref 0.0–0.2)

## 2021-05-18 LAB — BASIC METABOLIC PANEL
Anion gap: 9 (ref 5–15)
BUN: 16 mg/dL (ref 8–23)
CO2: 24 mmol/L (ref 22–32)
Calcium: 7.9 mg/dL — ABNORMAL LOW (ref 8.9–10.3)
Chloride: 107 mmol/L (ref 98–111)
Creatinine, Ser: 1.21 mg/dL (ref 0.61–1.24)
GFR, Estimated: >60 mL/min (ref >60)
Glucose, Bld: 66 mg/dL — ABNORMAL LOW (ref 70–99)
Potassium: 3.4 mmol/L — ABNORMAL LOW (ref 3.5–5.1)
Sodium: 140 mmol/L (ref 135–145)

## 2021-05-18 MED ORDER — KCL IN DEXTROSE-NACL 40-5-0.9 MEQ/L-%-% IV SOLN
INTRAVENOUS | Status: DC
  Filled 2021-05-18 (×3): qty 1000

## 2021-05-18 MED ORDER — HYDRALAZINE HCL 20 MG/ML IJ SOLN
5.0000 mg | Freq: Four times a day (QID) | INTRAMUSCULAR | Status: DC | PRN
  Administered 2021-05-18 – 2021-05-19 (×2): 5 mg via INTRAVENOUS
  Filled 2021-05-18 (×2): qty 1

## 2021-05-18 NOTE — Progress Notes (Signed)
PROGRESS NOTE    MOHAB ASHBY  TGG:[id number] DOB: [birth year] DOA: 05/13/2021 PCP: Eunice Blase, MD    Chief Complaint  Patient presents with   Emesis    Brief Narrative:  Patient is 81 year old gentleman history of osteoarthritis, hypertension, right eye blepharitis, blindness, BPH, type 2 diabetes, hyperlipidemia, nephrolithiasis, hypogonadism, osteoporosis in the setting of androgen replacement therapy, gastric ulcer, near syncopal episode presented to the ED with persistent nausea, multiple episodes of emesis.  Patient denied any diarrhea constipation melena hematochezia.  Patient denied any urinary symptoms.  Patient noted in the ED to have some sinus pauses.  Patient also noted to have some positional lightheadedness but no chest pain palpitations or diaphoresis.  Patient seen in the ED COVID-19 PCR negative.  Influenza PCR negative.  Cardiac enzymes normal.  Urinalysis with some glycosuria.  Chest x-ray done no acute cardiopulmonary disease.  Head CT showed hypodensity in the left aspect of the pons suspicious for infarct new since 2017, MRI brain done with small remote infarct in the left pons.  Patient noted to have ongoing multiple pauses.  Patient seen in consultation by cardiology who recommended transfer to [facility] for evaluation by EP and possible PPM placement.  2D echo done.   Assessment & Plan:   Principal Problem:   Sinus pause Active Problems:   Benign essential hypertension   BPH (benign prostatic hyperplasia)   Type 2 diabetes mellitus (HCC)   Tobacco use disorder   Depression   Near syncope   Gastro-esophageal reflux disease without esophagitis   Stage 3a chronic kidney disease (CKD) (HCC)   Nausea and vomiting   Borderline prolonged QT interval   Abnormal head CT   Protein-calorie malnutrition, severe  #1 sinus pauses noted on telemetry -Patient noted to have multiple sinus pauses since admission noted on telemetry with some intermittent  occasional odynophagia noted by evaluation by speech therapy. -Patient noted to have lightheadedness, denies any syncopal episodes. -Patient noted to have multiple sinus pauses greater than 5 seconds. -2D echo done with EF of 60 to 70%,JJKK, grade 1 diastolic dysfunction, normal right ventricular systolic function.  No significant valvular abnormalities. -Patient seen in consultation by cardiology who are recommending transfer to [facility] for evaluation by EP  -Pacer pads at bedside. -seen by EP and it was not felt that permanent pacemaker placement was indicated -per EP, a temporary transvenous pacer could be placed if GI feels needed prior to attempting EGD  2.  Abnormal head CT/remote infarct -Patient presented with questionable slurred speech per admitting physician, some dizziness, nausea or emesis. -CT head done with age-indeterminate CVA noted. -MRI brain done with remote infarct noted in the left pons region. -Carotid Dopplers with no significant ICA stenosis. -2D echo with no embolic source is noted. -PT/OT/ST. -discussed with Neurology, Dr. Lorrin Goodell who felt MRI findings were chronic -recommendations were for risk factor modification -would benefit from anticoagulation and statin on discharge  3.  Intractable nausea vomiting with chest discomfort -appears to have started rather suddenly -Placed on PPI. -GI following -esophagram esophagram showed mild relative areas of narrowing at GE junction. Barium pill did not pass to the air level of the mid  -he is still unable to tolerate any po intake -Discussed with Dr. Lorenso Courier who is on-call for GI this weekend.  She has discussed with Dr. Collene Mares and potentially patient could have EGD tomorrow with APC report.  We will keep n.p.o. after midnight.  4.  Borderline prolonged QT interval -Avoid  QT prolonging medications. -Repeat EKG with QTC of 509. -Replete electrolytes keep potassium approximately 4, magnesium greater than  2. -Cardiology following.  5.  Odynophagia -Patient with complaints of difficulty swallowing which was noted by speech therapy associated with oral intake. -appreciate GI input -esophagram showed mild relative areas of narrowing at GE junction. Barium pill did not pass to the air level of the mid esophagus -Placed on IV PPI -Appears that he would need further work-up with EGD, await further input from GI  6.  Hypertension -As needed hydralazine -Once tolerating oral intake can start on oral antihypertensive for better blood pressure management. -Cardiology following.  7.  Tobacco use disorder -Tobacco cessation.  8.  BPH -Resume home regimen Proscar.  9.  GERD -IV PPI.  10.  Stage IIIa CKD -Stable.  11.  Diabetes mellitus type 2 -Hemoglobin A1c 6.1 (05/14/2021) -He is on Januvia as outpatient -currently on SSI  12. Hypokalemia/Hypomagnesemia -replaced  13. Paroxysmal A fib -Patient reports history of the same -review of cardiology notes indicate that he did not want anticoagulation in the past -CHADSVASC of 6 (age(2), diabetes(1), hypertension(1), evidence of stroke(2)) -discussed anticoagulation with patient and he will consider it prior to discharge.  DVT prophylaxis: SCDs Code Status: Full Family Communication: Updated patient.  No family at bedside. Disposition:   Status is: Inpatient The patient will require care spanning > 2 midnights and should be moved to inpatient because: continue management of arrythmias and nausea/vomiting  Planned Discharge Destination: To be determined        Consultants:  Cardiology: Dr. Gardiner Rhyme 05/13/2021 GI, Dr, Benson Norway EP Dr. Lovena Le  Procedures: 2D echo 05/13/2021 Carotid Dopplers 05/13/2021 MRI brain 05/13/2021 Chest x-ray 05/13/2021 CT head without contrast 05/13/2021      Antimicrobials: None   Subjective: He has had small sips of clear liquids today.  He still has discomfort with the sips, but has not had as  much gagging.  Objective: Vitals:   05/18/21 1258 05/18/21 1302 05/18/21 1453 05/18/21 2038  BP: (!) 192/54 (!) 195/66 (!) 180/65 (!) 156/65  Pulse: (!) 54  65 (!) 57  Resp: 18 18 16 16   Temp: 98.1 F (36.7 C)  97.8 F (36.6 C) 98.1 F (36.7 C)  TempSrc: Oral  Axillary Oral  SpO2: 100% 100% 98% 97%  Weight:      Height:        Intake/Output Summary (Last 24 hours) at 05/18/2021 2142 Last data filed at 05/18/2021 2049 Gross per 24 hour  Intake 1384.65 ml  Output 1600 ml  Net -215.35 ml   Filed Weights   05/13/21 0517  Weight: 70.3 kg    Examination:  General exam: Alert, awake, oriented x 3 Respiratory system: Clear to auscultation. Respiratory effort normal. Cardiovascular system:RRR. No murmurs, rubs, gallops. Gastrointestinal system: Abdomen is nondistended, soft and nontender. No organomegaly or masses felt. Normal bowel sounds heard. Central nervous system: Alert and oriented. No focal neurological deficits. Extremities: No C/C/E, +pedal pulses Skin: No rashes, lesions or ulcers Psychiatry: Judgement and insight appear normal. Mood & affect appropriate.        Data Reviewed: I have personally reviewed following labs and imaging studies  CBC: Recent Labs  Lab 05/13/21 0414 05/13/21 0432 05/15/21 0103 05/16/21 0329 05/18/21 0219  WBC 8.9  --  13.6* 11.3* 7.0  NEUTROABS 6.1  --  9.9*  --   --   HGB 12.8* 13.3 11.9* 11.7* 11.2*  HCT 39.1 39.0 36.5* 35.7* 33.7*  MCV 92.9  --  92.2 91.5 91.8  PLT 184  --  165 168 408    Basic Metabolic Panel: Recent Labs  Lab 05/13/21 0414 05/13/21 0432 05/13/21 0627 05/15/21 0103 05/16/21 0329 05/18/21 0219  NA 136 139  --  139 137 140  K 3.9 4.0  --  3.2* 4.2 3.4*  CL 103 104  --  103 107 107  CO2 25  --   --  26 22 24   GLUCOSE 216* 220*  --  96 88 66*  BUN 15 12  --  18 26* 16  CREATININE 1.31* 1.50*  --  1.43* 1.36* 1.21  CALCIUM 9.4  --   --  8.4* 7.8* 7.9*  MG  --   --  2.0 1.6* 2.6*  --      GFR: Estimated Creatinine Clearance: 48.4 mL/min (by C-G formula based on SCr of 1.21 mg/dL).  Liver Function Tests: Recent Labs  Lab 05/13/21 0414  AST 19  ALT 13  ALKPHOS 48  BILITOT 0.9  PROT 6.8  ALBUMIN 3.6    CBG: Recent Labs  Lab 05/17/21 2218 05/18/21 0733 05/18/21 1145 05/18/21 1608 05/18/21 2045  GLUCAP 135* 75 83 150* 148*     Recent Results (from the past 240 hour(s))  Resp Panel by RT-PCR (Flu A&B, Covid) Nasopharyngeal Swab     Status: None   Collection Time: 05/13/21  4:11 AM   Specimen: Nasopharyngeal Swab; Nasopharyngeal(NP) swabs in vial transport medium  Result Value Ref Range Status   SARS Coronavirus 2 by RT PCR NEGATIVE NEGATIVE Final    Comment: (NOTE) SARS-CoV-2 target nucleic acids are NOT DETECTED.  The SARS-CoV-2 RNA is generally detectable in upper respiratory specimens during the acute phase of infection. The lowest concentration of SARS-CoV-2 viral copies this assay can detect is 138 copies/mL. A negative result does not preclude SARS-Cov-2 infection and should not be used as the sole basis for treatment or other patient management decisions. A negative result may occur with  improper specimen collection/handling, submission of specimen other than nasopharyngeal swab, presence of viral mutation(s) within the areas targeted by this assay, and inadequate number of viral copies(<138 copies/mL). A negative result must be combined with clinical observations, patient history, and epidemiological information. The expected result is Negative.  Fact Sheet for Patients:  EntrepreneurPulse.com.au  Fact Sheet for Healthcare Providers:  IncredibleEmployment.be  This test is no t yet approved or cleared by the [place] FDA and  has been authorized for detection and/or diagnosis of SARS-CoV-2 by FDA under an Emergency Use Authorization (EUA). This EUA will remain  in effect (meaning this test can be  used) for the duration of the COVID-19 declaration under Section 564(b)(1) of the Act, 21 U.S.C.section 360bbb-3(b)(1), unless the authorization is terminated  or revoked sooner.       Influenza A by PCR NEGATIVE NEGATIVE Final   Influenza B by PCR NEGATIVE NEGATIVE Final    Comment: (NOTE) The Xpert Xpress SARS-CoV-2/FLU/RSV plus assay is intended as an aid in the diagnosis of influenza from Nasopharyngeal swab specimens and should not be used as a sole basis for treatment. Nasal washings and aspirates are unacceptable for Xpert Xpress SARS-CoV-2/FLU/RSV testing.  Fact Sheet for Patients: EntrepreneurPulse.com.au  Fact Sheet for Healthcare Providers: IncredibleEmployment.be  This test is not yet approved or cleared by the [place] FDA and has been authorized for detection and/or diagnosis of SARS-CoV-2 by FDA under an Emergency Use Authorization (EUA). This EUA will remain in effect (meaning this test can  be used) for the duration of the COVID-19 declaration under Section 564(b)(1) of the Act, 21 U.S.C. section 360bbb-3(b)(1), unless the authorization is terminated or revoked.  Performed at [facility], [place] [address] [place], [place] [address]   Surgical PCR screen     Status: None   Collection Time: 05/15/21  6:04 AM   Specimen: Nasal Mucosa; Nasal Swab  Result Value Ref Range Status   MRSA, PCR NEGATIVE NEGATIVE Final   Staphylococcus aureus NEGATIVE NEGATIVE Final    Comment: (NOTE) The Xpert SA Assay (FDA approved for NASAL specimens in patients 45 years of age and older), is one component of a comprehensive surveillance program. It is not intended to diagnose infection nor to guide or monitor treatment. Performed at [facility], [place] [address] [place],  [address]          Radiology Studies: No results found.      Scheduled Meds:  feeding supplement (GLUCERNA SHAKE)  237 mL  Oral TID BM   finasteride  5 mg Oral Daily   insulin aspart  0-9 Units Subcutaneous TID WC   pantoprazole (PROTONIX) IV  40 mg Intravenous Q12H   sodium chloride flush  3 mL Intravenous Q12H   Continuous Infusions:  sodium chloride     dextrose 5 % and 0.9 % NaCl with KCl 40 mEq/L 75 mL/hr at 05/18/21 2140     LOS: 4 days    Time spent: 81minutes    Kathie Dike, MD [facility]   To contact the attending provider between 7A-7P or the covering provider during after hours 7P-7A, please log into the web site [email or web address] and access using universal [place] password for that web site. If you do not have the password, please call the hospital operator.  05/18/2021, 9:42 PM

## 2021-05-19 ENCOUNTER — Inpatient Hospital Stay (HOSPITAL_COMMUNITY): Payer: HMO | Admitting: Certified Registered Nurse Anesthetist

## 2021-05-19 ENCOUNTER — Encounter (HOSPITAL_COMMUNITY): Payer: Self-pay | Admitting: Internal Medicine

## 2021-05-19 ENCOUNTER — Encounter (HOSPITAL_COMMUNITY)
Admission: EM | Disposition: A | Discharge status: Home / Self Care | Payer: Self-pay | Source: Home / Self Care | Attending: Internal Medicine

## 2021-05-19 DIAGNOSIS — N400 Enlarged prostate without lower urinary tract symptoms: Secondary | ICD-10-CM | POA: Diagnosis not present

## 2021-05-19 DIAGNOSIS — I10 Essential (primary) hypertension: Secondary | ICD-10-CM | POA: Diagnosis not present

## 2021-05-19 DIAGNOSIS — I455 Other specified heart block: Secondary | ICD-10-CM | POA: Diagnosis not present

## 2021-05-19 DIAGNOSIS — R930 Abnormal findings on diagnostic imaging of skull and head, not elsewhere classified: Secondary | ICD-10-CM | POA: Diagnosis not present

## 2021-05-19 DIAGNOSIS — R001 Bradycardia, unspecified: Secondary | ICD-10-CM

## 2021-05-19 HISTORY — PX: SAVORY DILATION: SHX5439

## 2021-05-19 HISTORY — PX: TEMPORARY PACEMAKER: CATH118268

## 2021-05-19 HISTORY — PX: ESOPHAGOGASTRODUODENOSCOPY (EGD) WITH PROPOFOL: SHX5813

## 2021-05-19 LAB — GLUCOSE, CAPILLARY
Glucose-Capillary: 142 mg/dL — ABNORMAL HIGH (ref 70–99)
Glucose-Capillary: 139 mg/dL — ABNORMAL HIGH (ref 70–99)
Glucose-Capillary: 111 mg/dL — ABNORMAL HIGH (ref 70–99)
Glucose-Capillary: 189 mg/dL — ABNORMAL HIGH (ref 70–99)

## 2021-05-19 LAB — BASIC METABOLIC PANEL
Anion gap: 6 (ref 5–15)
BUN: 9 mg/dL (ref 8–23)
CO2: 27 mmol/L (ref 22–32)
Calcium: 8.3 mg/dL — ABNORMAL LOW (ref 8.9–10.3)
Chloride: 107 mmol/L (ref 98–111)
Creatinine, Ser: 1.19 mg/dL (ref 0.61–1.24)
GFR, Estimated: >60 mL/min (ref >60)
Glucose, Bld: 156 mg/dL — ABNORMAL HIGH (ref 70–99)
Potassium: 4.8 mmol/L (ref 3.5–5.1)
Sodium: 140 mmol/L (ref 135–145)

## 2021-05-19 LAB — CBC
HCT: 34.6 % — ABNORMAL LOW (ref 39.0–52.0)
Hemoglobin: 11.2 g/dL — ABNORMAL LOW (ref 13.0–17.0)
MCH: 29.9 pg (ref 26.0–34.0)
MCHC: 32.4 g/dL (ref 30.0–36.0)
MCV: 92.5 fL (ref 80.0–100.0)
Platelets: 185 10*3/uL (ref 150–400)
RBC: 3.74 MIL/uL — ABNORMAL LOW (ref 4.22–5.81)
RDW: 13.2 % (ref 11.5–15.5)
WBC: 6.9 10*3/uL (ref 4.0–10.5)
nRBC: 0 % (ref 0.0–0.2)

## 2021-05-19 SURGERY — TEMPORARY PACEMAKER
Anesthesia: LOCAL

## 2021-05-19 SURGERY — ESOPHAGOGASTRODUODENOSCOPY (EGD) WITH PROPOFOL
Anesthesia: General

## 2021-05-19 MED ORDER — SUCRALFATE 1 GM/10ML PO SUSP
1.0000 g | Freq: Three times a day (TID) | ORAL | Status: DC
  Administered 2021-05-19: 1 g via ORAL
  Filled 2021-05-19: qty 10

## 2021-05-19 MED ORDER — SUCCINYLCHOLINE CHLORIDE 200 MG/10ML IV SOSY
PREFILLED_SYRINGE | INTRAVENOUS | Status: DC | PRN
  Administered 2021-05-19: 120 mg via INTRAVENOUS

## 2021-05-19 MED ORDER — FENTANYL CITRATE (PF) 100 MCG/2ML IJ SOLN
INTRAMUSCULAR | Status: DC | PRN
  Administered 2021-05-19 (×2): 50 ug via INTRAVENOUS

## 2021-05-19 MED ORDER — SODIUM CHLORIDE 0.9 % IV SOLN
INTRAVENOUS | Status: AC | PRN
  Administered 2021-05-19: 10 mL/h via INTRAVENOUS

## 2021-05-19 MED ORDER — FENTANYL CITRATE (PF) 100 MCG/2ML IJ SOLN
INTRAMUSCULAR | Status: AC
  Filled 2021-05-19: qty 2

## 2021-05-19 MED ORDER — SODIUM CHLORIDE 0.9 % IV SOLN: INTRAVENOUS | Status: DC

## 2021-05-19 MED ORDER — LIDOCAINE HCL (PF) 1 % IJ SOLN
INTRAMUSCULAR | Status: AC
  Filled 2021-05-19: qty 30

## 2021-05-19 MED ORDER — CHLORHEXIDINE GLUCONATE CLOTH 2 % EX PADS
6.0000 | MEDICATED_PAD | Freq: Every day | CUTANEOUS | Status: DC
  Administered 2021-05-19 – 2021-05-23 (×5): 6 via TOPICAL

## 2021-05-19 MED ORDER — ORAL CARE MOUTH RINSE
15.0000 mL | Freq: Two times a day (BID) | OROMUCOSAL | Status: DC
  Administered 2021-05-19 – 2021-05-24 (×9): 15 mL via OROMUCOSAL

## 2021-05-19 MED ORDER — SODIUM CHLORIDE 0.9 % IV SOLN: INTRAVENOUS | Status: DC | PRN

## 2021-05-19 MED ORDER — LIDOCAINE 2% (20 MG/ML) 5 ML SYRINGE
INTRAMUSCULAR | Status: DC | PRN
  Administered 2021-05-19: 60 mg via INTRAVENOUS

## 2021-05-19 MED ORDER — SODIUM CHLORIDE 0.9% FLUSH: 3.0000 mL | INTRAVENOUS | Status: DC | PRN

## 2021-05-19 MED ORDER — PROPOFOL 10 MG/ML IV BOLUS
INTRAVENOUS | Status: DC | PRN
  Administered 2021-05-19: 100 mg via INTRAVENOUS

## 2021-05-19 MED ORDER — PHENYLEPHRINE 40 MCG/ML (10ML) SYRINGE FOR IV PUSH (FOR BLOOD PRESSURE SUPPORT)
PREFILLED_SYRINGE | INTRAVENOUS | Status: DC | PRN
Start: 2021-05-19 — End: 2021-05-19
  Administered 2021-05-19: 80 ug via INTRAVENOUS
  Administered 2021-05-19: 120 ug via INTRAVENOUS

## 2021-05-19 MED ORDER — EPHEDRINE SULFATE-NACL 50-0.9 MG/10ML-% IV SOSY
PREFILLED_SYRINGE | INTRAVENOUS | Status: DC | PRN
  Administered 2021-05-19: 5 mg via INTRAVENOUS

## 2021-05-19 MED ORDER — HEPARIN (PORCINE) IN NACL 1000-0.9 UT/500ML-% IV SOLN
INTRAVENOUS | Status: AC
  Filled 2021-05-19: qty 1000

## 2021-05-19 MED ORDER — SODIUM CHLORIDE 0.9 % IV SOLN: 250.0000 mL | INTRAVENOUS | Status: DC | PRN

## 2021-05-19 MED ORDER — FENTANYL CITRATE (PF) 100 MCG/2ML IJ SOLN
INTRAMUSCULAR | Status: DC | PRN
  Administered 2021-05-19: 25 ug via INTRAVENOUS

## 2021-05-19 MED ORDER — HEPARIN (PORCINE) IN NACL 1000-0.9 UT/500ML-% IV SOLN
INTRAVENOUS | Status: DC | PRN
  Administered 2021-05-19: 500 mL

## 2021-05-19 MED ORDER — SODIUM CHLORIDE 0.9% FLUSH
3.0000 mL | Freq: Two times a day (BID) | INTRAVENOUS | Status: DC
  Administered 2021-05-19 – 2021-05-24 (×10): 3 mL via INTRAVENOUS

## 2021-05-19 MED ORDER — MIDAZOLAM HCL 2 MG/2ML IJ SOLN
INTRAMUSCULAR | Status: AC
  Filled 2021-05-19: qty 2

## 2021-05-19 MED ORDER — MIDAZOLAM HCL 2 MG/2ML IJ SOLN
INTRAMUSCULAR | Status: DC | PRN
  Administered 2021-05-19: 1 mg via INTRAVENOUS

## 2021-05-19 MED ORDER — LIDOCAINE HCL (PF) 1 % IJ SOLN
INTRAMUSCULAR | Status: DC | PRN
  Administered 2021-05-19: 10 mL

## 2021-05-19 MED ORDER — ONDANSETRON HCL 4 MG/2ML IJ SOLN
INTRAMUSCULAR | Status: DC | PRN
Start: 2021-05-19 — End: 2021-05-19
  Administered 2021-05-19: 4 mg via INTRAVENOUS

## 2021-05-19 SURGICAL SUPPLY — 9 items
CABLE ADAPT PACING TEMP 12FT (ADAPTER) ×1 IMPLANT
CATH S G BIP PACING (CATHETERS) ×1 IMPLANT
ELECT DEFIB PAD ADLT CADENCE (PAD) ×1 IMPLANT
PACK CARDIAC CATHETERIZATION (CUSTOM PROCEDURE TRAY) ×1 IMPLANT
PROTECTION STATION PRESSURIZED (MISCELLANEOUS) ×2
SHEATH PINNACLE 6F 10CM (SHEATH) ×1 IMPLANT
SHEATH PROBE COVER 6X72 (BAG) ×1 IMPLANT
SLEEVE REPOSITIONING LENGTH 30 (MISCELLANEOUS) ×1 IMPLANT
STATION PROTECTION PRESSURIZED (MISCELLANEOUS) IMPLANT

## 2021-05-19 SURGICAL SUPPLY — 15 items

## 2021-05-19 NOTE — Anesthesia Procedure Notes (Signed)
Procedure Name: Intubation Date/Time: 05/19/2021 1:41 PM Performed by: Harden Mo, CRNA Pre-anesthesia Checklist: Patient identified, Emergency Drugs available, Suction available and Patient being monitored Patient Re-evaluated:Patient Re-evaluated prior to induction Oxygen Delivery Method: Circle System Utilized Preoxygenation: Pre-oxygenation with 100% oxygen Induction Type: IV induction, Rapid sequence and Cricoid Pressure applied Laryngoscope Size: Miller and 2 Grade View: Grade I Tube type: Oral Tube size: 7.5 mm Number of attempts: 1 Airway Equipment and Method: Stylet and Oral airway Placement Confirmation: ETT inserted through vocal cords under direct vision, positive ETCO2 and breath sounds checked- equal and bilateral Secured at: 23 cm Tube secured with: Tape Dental Injury: Teeth and Oropharynx as per pre-operative assessment

## 2021-05-19 NOTE — Progress Notes (Addendum)
PROGRESS NOTE    MASSIE MEES  KZS:[id number] DOB: Aug 21, 1940 DOA: 05/13/2021 PCP: Eunice Blase, MD    Chief Complaint  Patient presents with   Emesis    Brief Narrative:  Patient is 81 year old gentleman history of osteoarthritis, hypertension, right eye blepharitis, blindness, BPH, type 2 diabetes, hyperlipidemia, nephrolithiasis, hypogonadism, osteoporosis in the setting of androgen replacement therapy, gastric ulcer, near syncopal episode presented to the ED with persistent nausea, multiple episodes of emesis.  Patient denied any diarrhea constipation melena hematochezia.  Patient denied any urinary symptoms.  Patient noted in the ED to have some sinus pauses.  Patient also noted to have some positional lightheadedness but no chest pain palpitations or diaphoresis.  Patient seen in consultation by cardiology who recommended transfer to [facility] for evaluation by EP and possible PPM placement.  After further evaluation by electrophysiology, it was felt that patient's bradycardia/transient block was related to increased vagal tone during vomiting/retching and that he did not have an indication for permanent pacemaker placement.  Since his nausea and vomiting persisted and he was unable to tolerate anything by mouth, he was seen by GI who performed EGD (once patient had temporary transvenous pacemaker) which showed LA grade D esophagitis.  He also had esophageal stricture that was dilated.   Assessment & Plan:   Principal Problem:   Sinus pause Active Problems:   Benign essential hypertension   BPH (benign prostatic hyperplasia)   Type 2 diabetes mellitus (HCC)   Tobacco use disorder   Depression   Near syncope   Gastro-esophageal reflux disease without esophagitis   Stage 3a chronic kidney disease (CKD) (HCC)   Nausea and vomiting   Borderline prolonged QT interval   Abnormal head CT   Protein-calorie malnutrition, severe  #1 sinus pauses noted on  telemetry -Patient noted to have multiple sinus pauses since admission noted on telemetry with some intermittent occasional odynophagia noted by evaluation by speech therapy. -Patient noted to have lightheadedness, denies any syncopal episodes. -Patient noted to have multiple sinus pauses greater than 5 seconds. -2D echo done with EF of 60 to 73%,UKGU, grade 1 diastolic dysfunction, normal right ventricular systolic function.  No significant valvular abnormalities. -Patient seen in consultation by cardiology who are recommending transfer to [facility] for evaluation by EP  -Pacer pads at bedside. -seen by EP and it was not felt that permanent pacemaker placement was indicated -Temporary transvenous pacemaker placed on 2/6 for EGD  2.  Abnormal head CT/remote infarct -Patient presented with questionable slurred speech per admitting physician, some dizziness, nausea or emesis. -CT head done with age-indeterminate CVA noted. -MRI brain done with remote infarct noted in the left pons region. -Carotid Dopplers with no significant ICA stenosis. -2D echo with no embolic source is noted. -PT/OT/ST. -discussed with Neurology, Dr. Lorrin Goodell who felt MRI findings were chronic -recommendations were for risk factor modification -would benefit from anticoagulation and statin on discharge  3.  Intractable nausea vomiting with chest discomfort -GI following -esophagram esophagram showed mild relative areas of narrowing at GE junction. Barium pill did not pass to the air level of the mid  -He had EGD on 2/6 that showed LA grade D esophagitis.  He had a esophageal stricture that was dilated. -Continues to have significant pain after eating and was having vomiting in his room after having supper -Downgraded his diet to full liquids for now -Continue on Protonix twice daily -Added Carafate  4.  Borderline prolonged QT interval -Avoid QT prolonging medications. -  Repeat EKG with QTC of  509. -Replete electrolytes keep potassium approximately 4, magnesium greater than 2. -Cardiology following.  5.  Odynophagia -Patient with complaints of difficulty swallowing which was noted by speech therapy associated with oral intake. -appreciate GI input -esophagram showed mild relative areas of narrowing at GE junction. Barium pill did not pass to the air level of the mid esophagus -Placed on IV PPI -Underwent EGD on 2/6 which showed LA grade D esophagitis.  He also had an esophageal stricture that was dilated.  6.  Hypertension -As needed hydralazine -Once tolerating oral intake can start on oral antihypertensive for better blood pressure management. -Cardiology following.  7.  Tobacco use disorder -Tobacco cessation.  8.  BPH -Resume home regimen Proscar.  9.  GERD -IV PPI.  10.  Stage IIIa CKD -Stable.  11.  Diabetes mellitus type 2 -Hemoglobin A1c 6.1 (05/14/2021) -He is on Januvia as outpatient -currently on SSI  12. Hypokalemia/Hypomagnesemia -replaced  13. Paroxysmal A fib -Patient reports history of the same -review of cardiology notes indicate that he did not want anticoagulation in the past -CHADSVASC of 6 (age(2), diabetes(1), hypertension(1), evidence of stroke(2)) -discussed anticoagulation with patient and he will consider it prior to discharge.  DVT prophylaxis: SCDs Code Status: Full Family Communication: Updated patient.  No family at bedside. Disposition:   Status is: Inpatient The patient will require care spanning > 2 midnights and should be moved to inpatient because: continue management of arrythmias and nausea/vomiting  Planned Discharge Destination: To be determined        Consultants:  Cardiology: Dr. Gardiner Rhyme 05/13/2021 GI, Dr, Benson [place] EP Dr. Lovena Le  Procedures: 2D echo 05/13/2021 Carotid Dopplers 05/13/2021 MRI brain 05/13/2021 Chest x-ray 05/13/2021 CT head without contrast 05/13/2021 Temporary transvenous pacemaker placement  05/19/2021 EGD 05/19/2021      Antimicrobials: None   Subjective: Patient seen in his room want to reach.  Transferred to the ICU after temporary transvenous pacemaker placement and endoscopy.  Reports that since his endoscopy, he did have some liquids and had some vomiting after but felt this may be related to anesthesia.  Since then, he has eaten some solid food for supper.  During my visit, he was repeatedly vomiting and dry heaving.  Objective: Vitals:   05/19/21 1630 05/19/21 1700 05/19/21 1800 05/19/21 1900  BP: (!) 154/63 (!) 146/78 (!) 122/51 109/66  Pulse: 62 65 73 76  Resp: 16 16 19 16   Temp:      TempSrc:      SpO2: 98% 98% 97% 97%  Weight:      Height:        Intake/Output Summary (Last 24 hours) at 05/19/2021 1921 Last data filed at 05/19/2021 1840 Gross per 24 hour  Intake 325.38 ml  Output 1500 ml  Net -1174.62 ml   Filed Weights   05/13/21 0517  Weight: 70.3 kg    Examination:  General exam: Alert, awake, oriented x 3 Respiratory system: Clear to auscultation. Respiratory effort normal. Cardiovascular system:RRR. No murmurs, rubs, gallops. Gastrointestinal system: Abdomen is nondistended, soft and nontender. No organomegaly or masses felt. Normal bowel sounds heard. Central nervous system: Alert and oriented. No focal neurological deficits. Extremities: No C/C/E, +pedal pulses Skin: No rashes, lesions or ulcers Psychiatry: Judgement and insight appear normal. Mood & affect appropriate.        Data Reviewed: I have personally reviewed following labs and imaging studies  CBC: Recent Labs  Lab 05/13/21 0414 05/13/21 0432 05/15/21 0103 05/16/21 0329 05/18/21  0219 05/19/21 0614  WBC 8.9  --  13.6* 11.3* 7.0 6.9  NEUTROABS 6.1  --  9.9*  --   --   --   HGB 12.8* 13.3 11.9* 11.7* 11.2* 11.2*  HCT 39.1 39.0 36.5* 35.7* 33.7* 34.6*  MCV 92.9  --  92.2 91.5 91.8 92.5  PLT 184  --  165 168 155 962    Basic Metabolic Panel: Recent Labs  Lab  05/13/21 0414 05/13/21 0432 05/13/21 0627 05/15/21 0103 05/16/21 0329 05/18/21 0219 05/19/21 0614  NA 136 139  --  139 137 140 140  K 3.9 4.0  --  3.2* 4.2 3.4* 4.8  CL 103 104  --  103 107 107 107  CO2 25  --   --  26 22 24 27   GLUCOSE 216* 220*  --  96 88 66* 156*  BUN 15 12  --  18 26* 16 9  CREATININE 1.31* 1.50*  --  1.43* 1.36* 1.21 1.19  CALCIUM 9.4  --   --  8.4* 7.8* 7.9* 8.3*  MG  --   --  2.0 1.6* 2.6*  --   --     GFR: Estimated Creatinine Clearance: 49.2 mL/min (by C-G formula based on SCr of 1.19 mg/dL).  Liver Function Tests: Recent Labs  Lab 05/13/21 0414  AST 19  ALT 13  ALKPHOS 48  BILITOT 0.9  PROT 6.8  ALBUMIN 3.6    CBG: Recent Labs  Lab 05/18/21 1608 05/18/21 2045 05/19/21 0800 05/19/21 1110 05/19/21 1550  GLUCAP 150* 148* 142* 139* 111*     Recent Results (from the past 240 hour(s))  Resp Panel by RT-PCR (Flu A&B, Covid) Nasopharyngeal Swab     Status: None   Collection Time: 05/13/21  4:11 AM   Specimen: Nasopharyngeal Swab; Nasopharyngeal(NP) swabs in vial transport medium  Result Value Ref Range Status   SARS Coronavirus 2 by RT PCR NEGATIVE NEGATIVE Final    Comment: (NOTE) SARS-CoV-2 target nucleic acids are NOT DETECTED.  The SARS-CoV-2 RNA is generally detectable in upper respiratory specimens during the acute phase of infection. The lowest concentration of SARS-CoV-2 viral copies this assay can detect is 138 copies/mL. A negative result does not preclude SARS-Cov-2 infection and should not be used as the sole basis for treatment or other patient management decisions. A negative result may occur with  improper specimen collection/handling, submission of specimen other than nasopharyngeal swab, presence of viral mutation(s) within the areas targeted by this assay, and inadequate number of viral copies(<138 copies/mL). A negative result must be combined with clinical observations, patient history, and  epidemiological information. The expected result is Negative.  Fact Sheet for Patients:  EntrepreneurPulse.com.au  Fact Sheet for Healthcare Providers:  IncredibleEmployment.be  This test is no t yet approved or cleared by the [place] FDA and  has been authorized for detection and/or diagnosis of SARS-CoV-2 by FDA under an Emergency Use Authorization (EUA). This EUA will remain  in effect (meaning this test can be used) for the duration of the COVID-19 declaration under Section 564(b)(1) of the Act, 21 U.S.C.section 360bbb-3(b)(1), unless the authorization is terminated  or revoked sooner.       Influenza A by PCR NEGATIVE NEGATIVE Final   Influenza B by PCR NEGATIVE NEGATIVE Final    Comment: (NOTE) The Xpert Xpress SARS-CoV-2/FLU/RSV plus assay is intended as an aid in the diagnosis of influenza from Nasopharyngeal swab specimens and should not be used as a sole basis for treatment.  Nasal washings and aspirates are unacceptable for Xpert Xpress SARS-CoV-2/FLU/RSV testing.  Fact Sheet for Patients: EntrepreneurPulse.com.au  Fact Sheet for Healthcare Providers: IncredibleEmployment.be  This test is not yet approved or cleared by the [place] FDA and has been authorized for detection and/or diagnosis of SARS-CoV-2 by FDA under an Emergency Use Authorization (EUA). This EUA will remain in effect (meaning this test can be used) for the duration of the COVID-19 declaration under Section 564(b)(1) of the Act, 21 U.S.C. section 360bbb-3(b)(1), unless the authorization is terminated or revoked.  Performed at [facility], [place] [address], [place], [place] [address]   Surgical PCR screen     Status: None   Collection Time: 05/15/21  6:04 AM   Specimen: Nasal Mucosa; Nasal Swab  Result Value Ref Range Status   MRSA, PCR NEGATIVE NEGATIVE Final   Staphylococcus aureus NEGATIVE  NEGATIVE Final    Comment: (NOTE) The Xpert SA Assay (FDA approved for NASAL specimens in patients 45 years of age and older), is one component of a comprehensive surveillance program. It is not intended to diagnose infection nor to guide or monitor treatment. Performed at [facility], [place] [address] [place], [place] [address]          Radiology Studies: CARDIAC CATHETERIZATION  Result Date: 05/19/2021 Successful placement of temporary transvenous pacemaker.  Set to back up rate 50 bpm with patient natively conducting in the 60s.  Can increase if desired at time of endoscopy.        Scheduled Meds:  Chlorhexidine Gluconate Cloth  6 each Topical Daily   feeding supplement (GLUCERNA SHAKE)  237 mL Oral TID BM   finasteride  5 mg Oral Daily   insulin aspart  0-9 Units Subcutaneous TID WC   pantoprazole (PROTONIX) IV  40 mg Intravenous Q12H   sodium chloride flush  3 mL Intravenous Q12H   sodium chloride flush  3 mL Intravenous Q12H   sucralfate  1 g Oral TID WC & HS   Continuous Infusions:  sodium chloride       LOS: 5 days    Time spent: 5minutes    Kathie Dike, MD [facility]   To contact the attending provider between 7A-7P or the covering provider during after hours 7P-7A, please log into the web site [email or web address] and access using universal [place] password for that web site. If you do not have the password, please call the hospital operator.  05/19/2021, 7:21 PM

## 2021-05-19 NOTE — Transfer of Care (Signed)
Immediate Anesthesia Transfer of Care Note  Patient: Scott Willis  Procedure(s) Performed: ESOPHAGOGASTRODUODENOSCOPY (EGD) WITH PROPOFOL SAVORY DILATION  Patient Location: Endoscopy Unit  Anesthesia Type:General  Level of Consciousness: awake and alert   Airway & Oxygen Therapy: Patient Spontanous Breathing  Post-op Assessment: Report given to RN, Post -op Vital signs reviewed and stable and Patient moving all extremities X 4  Post vital signs: Reviewed and stable  Last Vitals:  Vitals Value Taken Time  BP 180/53   Temp    Pulse 69 05/19/21 1411  Resp 19 05/19/21 1411  SpO2 100 % 05/19/21 1411  Vitals shown include unvalidated device data.  Last Pain:  Vitals:   05/19/21 1320  TempSrc: Temporal  PainSc: 0-No pain      Patients Stated Pain Goal: 0 (54/00/86 7619)  Complications: No notable events documented.

## 2021-05-19 NOTE — Progress Notes (Signed)
OT Cancellation Note  Patient Details Name: TANER RZEPKA MRN: [id number] DOB: Nov 30, 1940   Cancelled Treatment:    Reason Eval/Treat Not Completed: Other (comment) Pt reports MD told him to stay in bed and not even sit up. Noted plan for EGD and possible temp pacing wires. Will follow-up s/p procedure as medically appropriate.  Layla Maw 05/19/2021, 10:33 AM

## 2021-05-19 NOTE — Care Management Important Message (Signed)
Important Message  Patient Details  Name: MARKEY DEADY MRN: [id number] Date of Birth: 1940-05-25   Medicare Important Message Given:  Yes     [name] 05/19/2021, 10:08 AM

## 2021-05-19 NOTE — Progress Notes (Signed)
Patient was referred for Spiritual Consultation. Upon arrival patient not available. Adebayo Ensminger, M.Min. [phone].

## 2021-05-19 NOTE — Op Note (Signed)
[facility] Patient Name: Scott Willis Procedure Date : 05/19/2021 MRN: [id number] Attending MD: Carol Ada , MD Date of Birth: November 05, 1940 CSN: [id number] Age: 81 Admit Type: Inpatient Procedure:                Upper GI endoscopy Indications:              Dysphagia, Odynophagia, Abnormal UGI series Providers:                Carol Ada, MD, Jaci Carrel, RN, Tyna Jaksch Technician Referring MD:              Medicines:                General Anesthesia Complications:            No immediate complications. Estimated Blood Loss:     Estimated blood loss was minimal. Procedure:                Pre-Anesthesia Assessment:                           - Prior to the procedure, a History and Physical                            was performed, and patient medications and                            allergies were reviewed. The patient's tolerance of                            previous anesthesia was also reviewed. The risks                            and benefits of the procedure and the sedation                            options and risks were discussed with the patient.                            All questions were answered, and informed consent                            was obtained. Prior Anticoagulants: The patient has                            taken no previous anticoagulant or antiplatelet                            agents. ASA Grade Assessment: III - A patient with                            severe systemic disease. After reviewing the risks  and benefits, the patient was deemed in                            satisfactory condition to undergo the procedure.                           - Sedation was administered by an anesthesia                            professional. General anesthesia was attained.                           After obtaining informed consent, the endoscope was                            passed  under direct vision. Throughout the                            procedure, the patient's blood pressure, pulse, and                            oxygen saturations were monitored continuously. The                            [id number]-[id number] ([id number]) Olympus endoscope was introduced                            through the mouth, and advanced to the second part                            of duodenum. The upper GI endoscopy was                            accomplished without difficulty. The patient                            tolerated the procedure well. Scope In: Scope Out: Findings:      LA Grade D (one or more mucosal breaks involving at least 75% of       esophageal circumference) esophagitis with no bleeding was found in the       entire esophagus.      Three benign-appearing, intrinsic mild stenoses were found at the       cricopharyngeus. The narrowest stenosis measured 1.6 cm (inner diameter)       x 1 cm (in length). The stenoses were traversed. A guidewire was placed       and the scope was withdrawn. Dilation was performed with a Savary       dilator with no resistance at 17 mm. The dilation site was examined       following endoscope reinsertion and showed complete resolution of       luminal narrowing.      The stomach was normal.      The examined duodenum was normal.      In the supine position, because the patient has temporary pacer wires,       the EGD  scope was advanced into the esophagus. There was clear evidence       of an LA Grade D esophagitis. At around 23 cm there was evidence of a       mild stenosis consistent with the lodging of the pill with the       esophagram. Over a guidewire dilation was performed using the 16 mm       Savary dilation. A relook was performed and there was no evidence of any       mucosal breaks, but the dilation did irritate the friable mucosa and       some minor bleeding was induced. A 17 mm Savary was performed and two       breaks were  identified at the GE junction and the cervical esophagus.       There was no evidence of any mucosal breaks at 23 cm. No further       dilations were performed. Impression:               - LA Grade D reflux esophagitis with no bleeding.                           - Benign-appearing esophageal stenoses. Dilated.                           - Normal stomach.                           - Normal examined duodenum.                           - No specimens collected. Recommendation:           - Return patient to hospital ward for ongoing care.                           - Resume regular diet.                           - Continue present medications.                           - PPI BID x 1 month and then QD indefinitely. Procedure Code(s):        --- Professional ---                           [phone], Esophagogastroduodenoscopy, flexible,                            transoral; with insertion of guide wire followed by                            passage of dilator(s) through esophagus over guide                            wire Diagnosis Code(s):        --- Professional ---  K22.2, Esophageal obstruction                           K21.00, Gastro-esophageal reflux disease with                            esophagitis, without bleeding                           R13.10, Dysphagia, unspecified                           R93.3, Abnormal findings on diagnostic imaging of                            other parts of digestive tract CPT copyright 2019 American Medical Association. All rights reserved. The codes documented in this report are preliminary and upon coder review may  be revised to meet current compliance requirements. Carol Ada, MD Carol Ada, MD 05/19/2021 2:03:12 PM This report has been signed electronically. Number of Addenda: 0

## 2021-05-19 NOTE — Anesthesia Preprocedure Evaluation (Addendum)
Anesthesia Evaluation  Patient identified by MRN, date of birth, ID band Patient awake    Reviewed: Allergy & Precautions, NPO status , Patient's Chart, lab work & pertinent test results  History of Anesthesia Complications Negative for: history of anesthetic complications  Airway Mallampati: III  TM Distance: >3 FB Neck ROM: Full    Dental  (+) Dental Advisory Given, Edentulous Upper, Loose, Missing,    Pulmonary Current Smoker and Patient abstained from smoking.,    Pulmonary exam normal        Cardiovascular hypertension, Normal cardiovascular exam+ dysrhythmias Atrial Fibrillation    '23 TTE - EF 60 to 65%. Grade I diastolic dysfunction (impaired relaxation). Mild mitral valve regurgitation. Aortic valve regurgitation is trivial.  Temporary pacer placed due to intermittent AV block    Neuro/Psych PSYCHIATRIC DISORDERS Depression  Left eye partial blindness  CVA    GI/Hepatic Neg liver ROS, PUD, GERD  Controlled, Intractable N/V with presumed AV block due to hypervagatonia    Endo/Other  diabetes, Type 2, Oral Hypoglycemic Agents  Renal/GU CRFRenal disease     Musculoskeletal  (+) Arthritis ,   Abdominal   Peds  Hematology  (+) Blood dyscrasia, anemia ,   Anesthesia Other Findings   Reproductive/Obstetrics                            Anesthesia Physical Anesthesia Plan  ASA: 3  Anesthesia Plan: General   Post-op Pain Management: Minimal or no pain anticipated   Induction: Intravenous  PONV Risk Score and Plan: 2 and Treatment may vary due to age or medical condition and Ondansetron  Airway Management Planned: Oral ETT  Additional Equipment: None  Intra-op Plan:   Post-operative Plan: Extubation in OR  Informed Consent: I have reviewed the patients History and Physical, chart, labs and discussed the procedure including the risks, benefits and alternatives for the  proposed anesthesia with the patient or authorized representative who has indicated his/her understanding and acceptance.     Dental advisory given  Plan Discussed with: CRNA and Anesthesiologist  Anesthesia Plan Comments:        Anesthesia Quick Evaluation

## 2021-05-19 NOTE — Interval H&P Note (Signed)
History and Physical Interval Note:  05/19/2021 1:16 PM  Scott Willis  has presented today for surgery, with the diagnosis of Dysphagia.  The various methods of treatment have been discussed with the patient and family. After consideration of risks, benefits and other options for treatment, the patient has consented to  Procedure(s) with comments: ESOPHAGOGASTRODUODENOSCOPY (EGD) WITH PROPOFOL (N/A) - Dysphagia as a surgical intervention.  The patient's history has been reviewed, patient examined, no change in status, stable for surgery.  I have reviewed the patient's chart and labs.  Questions were answered to the patient's satisfaction.     Terez Freimark D

## 2021-05-19 NOTE — Progress Notes (Signed)
Electrophysiology Rounding Note  Patient Name: Scott Willis Date of Encounter: 05/19/2021  Primary Cardiologist: Buford Dresser, MD Electrophysiologist: New   Subjective   A&O x 4. Still not able to tolerate POs.  On for EGD today. Needs temp wire to support  Inpatient Medications    Scheduled Meds:  feeding supplement (GLUCERNA SHAKE)  237 mL Oral TID BM   finasteride  5 mg Oral Daily   insulin aspart  0-9 Units Subcutaneous TID WC   pantoprazole (PROTONIX) IV  40 mg Intravenous Q12H   sodium chloride flush  3 mL Intravenous Q12H   sodium chloride flush  3 mL Intravenous Q12H   Continuous Infusions:  sodium chloride     sodium chloride     dextrose 5 % and 0.9 % NaCl with KCl 40 mEq/L 75 mL/hr at 05/18/21 2140   PRN Meds: acetaminophen **OR** acetaminophen, diclofenac Sodium, hydrALAZINE, hydrocortisone cream, LORazepam, polyvinyl alcohol, prochlorperazine, sodium chloride flush   Vital Signs    Vitals:   05/18/21 1453 05/18/21 2038 05/19/21 0521 05/19/21 0800  BP: (!) 180/65 (!) 156/65 (!) 163/68 (!) 182/51  Pulse: 65 (!) 57 66 (!) 57  Resp: 16 16 17 18   Temp: 97.8 F (36.6 C) 98.1 F (36.7 C) 98 F (36.7 C) 97.8 F (36.6 C)  TempSrc: Axillary Oral Oral Oral  SpO2: 98% 97% 100% 99%  Weight:      Height:        Intake/Output Summary (Last 24 hours) at 05/19/2021 0829 Last data filed at 05/18/2021 2049 Gross per 24 hour  Intake 1384.65 ml  Output 1600 ml  Net -215.35 ml   Filed Weights   05/13/21 0517  Weight: 70.3 kg    Physical Exam    GEN- The patient is well appearing, alert and oriented x 3 today.   Head- normocephalic, atraumatic Eyes-  Sclera clear, conjunctiva pink Ears- hearing intact Oropharynx- clear Neck- supple Lungs- Clear to ausculation bilaterally, normal work of breathing Heart- Regular rate and rhythm, no murmurs, rubs or gallops GI- soft, NT, ND, + BS Extremities- no clubbing or cyanosis. No edema Skin- no rash or  lesion Psych- euthymic mood, full affect Neuro- strength and sensation are intact  Labs    CBC Recent Labs    05/18/21 0219 05/19/21 0614  WBC 7.0 6.9  HGB 11.2* 11.2*  HCT 33.7* 34.6*  MCV 91.8 92.5  PLT 155 235   Basic Metabolic Panel Recent Labs    05/18/21 0219 05/19/21 0614  NA 140 140  K 3.4* 4.8  CL 107 107  CO2 24 27  GLUCOSE 66* 156*  BUN 16 9  CREATININE 1.21 1.19  CALCIUM 7.9* 8.3*   Liver Function Tests No results for input(s): AST, ALT, ALKPHOS, BILITOT, PROT, ALBUMIN in the last 72 hours. No results for input(s): LIPASE, AMYLASE in the last 72 hours. Cardiac Enzymes No results for input(s): CKTOTAL, CKMB, CKMBINDEX, TROPONINI in the last 72 hours.   Telemetry    Sinus brady/NSR 50-60s (personally reviewed)  Radiology    No results found.  Patient Profile     81 y.o. male with a hx of HTN, DM, smoker, chronic back pain, HLD, blind L eye, peripheral vision only in his left,  AF admitted initially to [facility] w/intractable N/V found to have episodes of AV block and pausing, transferred to [facility] for EP/possible pacer. No indication for permanent pacing per GT   AVblock thought 2/2 hypervagatonia related to retching and esophogram concerning for  obstruction with evaluation pending    Thromboembolic risk factors ( age  -2, HTN-1) for a CHADSVASc Score of >= 3; disucssions re anticoagulation in the past , and currently will hold off on discussions pending workup   Assessment & Plan    Sinus bradycardia and concomitant heart block 2/2 hypervagatonia No indication for permament pacing Plan for temp wire to support EGD  2.  Odynophagia Severe  Plan for EGD this afternoon with temp pacer to support.  Esophagram showed mild relative areas of narrowing at GE junction. Barium pill did not pass to the air level of the mid esophagus  3. Paroxysmal atrial fibrillation Pt reports history of the same. CHA2DS2/VASc is at least 6 with age-indeterminate CVA noted  on CT.  Has previously refused [place].   Ideally his bradycardia issues will improve as his swallowing issues improve.   For questions or updates, please contact [place] Please consult [email or web address] for contact info under Cardiology/STEMI.  Signed, Shirley Friar, PA-C  05/19/2021, 8:29 AM

## 2021-05-19 NOTE — Interval H&P Note (Signed)
History and Physical Interval Note:  05/19/2021 12:27 PM  Scott Willis  has presented today for surgery, with the diagnosis of Bradycardia.  The various methods of treatment have been discussed with the patient and family. After consideration of risks, benefits and other options for treatment, the patient has consented to  Procedure(s): TEMPORARY PACEMAKER (N/A) as a surgical intervention.  The patient's history has been reviewed, patient examined, no change in status, stable for surgery.  I have reviewed the patient's chart and labs.  Questions were answered to the patient's satisfaction.     Deziya Amero [employer]

## 2021-05-19 NOTE — Anesthesia Postprocedure Evaluation (Signed)
Anesthesia Post Note  Patient: Scott Willis  Procedure(s) Performed: ESOPHAGOGASTRODUODENOSCOPY (EGD) WITH PROPOFOL SAVORY DILATION     Patient location during evaluation: PACU Anesthesia Type: General Level of consciousness: awake and alert Pain management: pain level controlled Vital Signs Assessment: post-procedure vital signs reviewed and stable Respiratory status: spontaneous breathing, nonlabored ventilation and respiratory function stable Cardiovascular status: stable and blood pressure returned to baseline Anesthetic complications: no   No notable events documented.  Last Vitals:  Vitals:   05/19/21 1420 05/19/21 1430  BP: (!) 186/76 (!) 184/71  Pulse: 66   Resp: 20   Temp:    SpO2: 100%     Last Pain:  Vitals:   05/19/21 1430  TempSrc:   PainSc: 0-No pain                 Audry Pili

## 2021-05-20 ENCOUNTER — Encounter (HOSPITAL_COMMUNITY): Payer: Self-pay | Admitting: Internal Medicine

## 2021-05-20 LAB — BASIC METABOLIC PANEL
Anion gap: 8 (ref 5–15)
BUN: 15 mg/dL (ref 8–23)
CO2: 22 mmol/L (ref 22–32)
Calcium: 8.5 mg/dL — ABNORMAL LOW (ref 8.9–10.3)
Chloride: 112 mmol/L — ABNORMAL HIGH (ref 98–111)
Creatinine, Ser: 1.38 mg/dL — ABNORMAL HIGH (ref 0.61–1.24)
GFR, Estimated: 52 mL/min — ABNORMAL LOW (ref >60)
Glucose, Bld: 139 mg/dL — ABNORMAL HIGH (ref 70–99)
Potassium: 4.4 mmol/L (ref 3.5–5.1)
Sodium: 142 mmol/L (ref 135–145)

## 2021-05-20 LAB — GLUCOSE, CAPILLARY
Glucose-Capillary: 159 mg/dL — ABNORMAL HIGH (ref 70–99)
Glucose-Capillary: 115 mg/dL — ABNORMAL HIGH (ref 70–99)
Glucose-Capillary: 126 mg/dL — ABNORMAL HIGH (ref 70–99)
Glucose-Capillary: 135 mg/dL — ABNORMAL HIGH (ref 70–99)

## 2021-05-20 LAB — CBC
HCT: 34.4 % — ABNORMAL LOW (ref 39.0–52.0)
Hemoglobin: 11.3 g/dL — ABNORMAL LOW (ref 13.0–17.0)
MCH: 30.6 pg (ref 26.0–34.0)
MCHC: 32.8 g/dL (ref 30.0–36.0)
MCV: 93.2 fL (ref 80.0–100.0)
Platelets: 174 10*3/uL (ref 150–400)
RBC: 3.69 MIL/uL — ABNORMAL LOW (ref 4.22–5.81)
RDW: 13.3 % (ref 11.5–15.5)
WBC: 8.1 10*3/uL (ref 4.0–10.5)
nRBC: 0 % (ref 0.0–0.2)

## 2021-05-20 MED ORDER — ADULT MULTIVITAMIN W/MINERALS CH
1.0000 | ORAL_TABLET | Freq: Every day | ORAL | Status: DC
  Administered 2021-05-20 – 2021-05-24 (×5): 1 via ORAL
  Filled 2021-05-20 (×5): qty 1

## 2021-05-20 MED ORDER — APIXABAN 5 MG PO TABS
5.0000 mg | ORAL_TABLET | Freq: Two times a day (BID) | ORAL | Status: DC
  Administered 2021-05-20 – 2021-05-24 (×7): 5 mg via ORAL
  Filled 2021-05-20 (×8): qty 1

## 2021-05-20 MED ORDER — SUCRALFATE 1 GM/10ML PO SUSP
1.0000 g | Freq: Three times a day (TID) | ORAL | Status: DC
  Administered 2021-05-20 – 2021-05-24 (×17): 1 g via ORAL
  Filled 2021-05-20 (×18): qty 10

## 2021-05-20 MED ORDER — ENSURE ENLIVE PO LIQD
237.0000 mL | Freq: Three times a day (TID) | ORAL | Status: DC
  Administered 2021-05-20 – 2021-05-24 (×10): 237 mL via ORAL

## 2021-05-20 MED ORDER — ENSURE ENLIVE PO LIQD
237.0000 mL | Freq: Three times a day (TID) | ORAL | Status: DC
  Administered 2021-05-20 – 2021-05-24 (×10): 237 mL via ORAL

## 2021-05-20 NOTE — Progress Notes (Signed)
°   05/20/21 1330  Clinical Encounter Type  Visited With Health care provider;Patient not available  Visit Type Initial  Referral From Physician  Consult/Referral To Chaplain   Attempted Spiritual Care consultation. Patient sleeping at this time. Asked nurse to page when patient available. Will try back later. Robertson, M.Min. [phone].

## 2021-05-20 NOTE — Progress Notes (Signed)
Confirmed with Dr. Pearletha Alfred pacer and sheath pulled at [phone]. OK to ambulate after 2 hours bed rest.  Orders placed for Up In Chair so PT can work with.  Legrand Como [address]" [place], PA-C  05/20/2021 12:22 PM

## 2021-05-20 NOTE — Progress Notes (Signed)
Subjective: No complaints.  Objective: Vital signs in last 24 hours: Temp:  [97.4 F (36.3 C)-98.6 F (37 C)] 98.3 F (36.8 C) (02/07 0803) Pulse Rate:  [0-78] 68 (02/07 0700) Resp:  [10-25] 13 (02/07 0700) BP: (97-190)/(46-105) 103/71 (02/07 0700) SpO2:  [92 %-100 %] 93 % (02/07 0700) Last BM Date: 05/11/21  Intake/Output from previous day: 02/06 0701 - 02/07 0700 In: 908.5 [P.O.:480; I.V.:428.5] Out: 1400 [Urine:1400] Intake/Output this shift: No intake/output data recorded.  General appearance: alert and no distress GI: soft, non-tender; bowel sounds normal; no masses,  no organomegaly  Lab Results: Recent Labs    05/18/21 0219 05/19/21 0614 05/20/21 0248  WBC 7.0 6.9 8.1  HGB 11.2* 11.2* 11.3*  HCT 33.7* 34.6* 34.4*  PLT 155 185 174   BMET Recent Labs    05/18/21 0219 05/19/21 0614 05/20/21 0248  NA 140 140 142  K 3.4* 4.8 4.4  CL 107 107 112*  CO2 24 27 22   GLUCOSE 66* 156* 139*  BUN 16 9 15   CREATININE 1.21 1.19 1.38*  CALCIUM 7.9* 8.3* 8.5*   LFT No results for input(s): PROT, ALBUMIN, AST, ALT, ALKPHOS, BILITOT, BILIDIR, IBILI in the last 72 hours. PT/INR No results for input(s): LABPROT, INR in the last 72 hours. Hepatitis Panel No results for input(s): HEPBSAG, HCVAB, HEPAIGM, HEPBIGM in the last 72 hours. C-Diff No results for input(s): CDIFFTOX in the last 72 hours. Fecal Lactopherrin No results for input(s): FECLLACTOFRN in the last 72 hours.  Studies/Results: CARDIAC CATHETERIZATION  Result Date: 05/19/2021 Successful placement of temporary transvenous pacemaker.  Set to back up rate 50 bpm with patient natively conducting in the 60s.  Can increase if desired at time of endoscopy.    Medications: Scheduled:  Chlorhexidine Gluconate Cloth  6 each Topical Daily   feeding supplement (GLUCERNA SHAKE)  237 mL Oral TID BM   finasteride  5 mg Oral Daily   insulin aspart  0-9 Units Subcutaneous TID WC   mouth rinse  15 mL Mouth Rinse BID    pantoprazole (PROTONIX) IV  40 mg Intravenous Q12H   sodium chloride flush  3 mL Intravenous Q12H   sodium chloride flush  3 mL Intravenous Q12H   sucralfate  1 g Oral TID WC & HS   Continuous:  sodium chloride 10 mL/hr at 05/20/21 0700    Assessment/Plan: 1) Swallow syncope. 2) LA Grade D esophagitis.   There is an improvement.  His swallowing was observed this AM and he did not have any immediate complaints of dysphagia or odynophagia.  Clinically he appears to be improving.  Plan: 1) Continue with pantoprazole BID. 2) Add sucralfate. 3) Continue with PO intake and monitor for symptoms. 4) Cardiac pacing per Cardiology.  LOS: 6 days   Scott Willis D 05/20/2021, 10:26 AM

## 2021-05-20 NOTE — Progress Notes (Signed)
PROGRESS NOTE    Scott Willis  PZW:[id number] DOB: Apr 20, 1940 DOA: 05/13/2021 PCP: Eunice Blase, MD    Chief Complaint  Patient presents with   Emesis    Brief Narrative:  Patient is 81 year old gentleman history of osteoarthritis, hypertension, right eye blepharitis, blindness, BPH, type 2 diabetes, hyperlipidemia, nephrolithiasis, hypogonadism, osteoporosis in the setting of androgen replacement therapy, gastric ulcer, near syncopal episode presented to the ED with persistent nausea, multiple episodes of emesis.  Patient denied any diarrhea constipation melena hematochezia.  Patient denied any urinary symptoms.  Patient noted in the ED to have some sinus pauses.  Patient also noted to have some positional lightheadedness but no chest pain palpitations or diaphoresis.  Patient seen in consultation by cardiology who recommended transfer to [facility] for evaluation by EP and possible PPM placement.  After further evaluation by electrophysiology, it was felt that patient's bradycardia/transient block was related to increased vagal tone during vomiting/retching and that he did not have an indication for permanent pacemaker placement.  Since his nausea and vomiting persisted and he was unable to tolerate anything by mouth, he was seen by GI who performed EGD (once patient had temporary transvenous pacemaker) which showed LA grade D esophagitis.  He also had esophageal stricture that was dilated.   Assessment & Plan:   Principal Problem:   Sinus pause Active Problems:   Benign essential hypertension   BPH (benign prostatic hyperplasia)   Type 2 diabetes mellitus (HCC)   Tobacco use disorder   Depression   Near syncope   Gastro-esophageal reflux disease without esophagitis   Stage 3a chronic kidney disease (CKD) (HCC)   Nausea and vomiting   Borderline prolonged QT interval   Abnormal head CT   Protein-calorie malnutrition, severe  #1 sinus pauses noted on  telemetry -Patient noted to have multiple sinus pauses since admission that appear to occur after patient attempted to eat leading to nausea, retching and chest pain. -Patient noted to have multiple sinus pauses greater than 5 seconds. -2D echo done with EF of 60 to 42%,PNTI, grade 1 diastolic dysfunction, normal right ventricular systolic function.  No significant valvular abnormalities. -Patient seen in consultation by cardiology who are recommending transfer to [facility] for evaluation by EP  -seen by EP and it was felt that sinus pauses were related to increased vagal tone during episodes of vomiting and retching, therefore permanent pacemaker placed was not felt to be indicated. -Temporary transvenous pacemaker placed on 2/6 for EGD -Since EGD, heart rate appears to have been stable -Temporary pacer discontinued on 2/7 with recommendations to monitor heart rate for another 24 hours.  2.  Abnormal head CT/remote infarct -Patient presented with questionable slurred speech per admitting physician, some dizziness, nausea or emesis. -CT head done with age-indeterminate CVA noted. -MRI brain done with remote infarct noted in the left pons region. -Carotid Dopplers with no significant ICA stenosis. -2D echo with no embolic source is noted. -PT/OT/ST. -discussed with Neurology, Dr. Lorrin Goodell who felt MRI findings were chronic -recommendations were for risk factor modification -would benefit from anticoagulation and statin on discharge  3.  Intractable nausea vomiting with chest discomfort -GI following -esophagram esophagram showed mild relative areas of narrowing at GE junction. Barium pill did not pass to the air level of the mid  -He had EGD on 2/6 that showed LA grade D esophagitis.  He had a esophageal stricture that was dilated. -After EGD, he was initially started on regular diet, but developed persistent  vomiting and chest discomfort -Downgraded his diet to full liquids and  he appears to be tolerating this better -May benefit from continuing on full liquids with very slow transition to solid food allowing his esophagitis to heal. -Continue on Protonix twice daily -Added Carafate  4.  Borderline prolonged QT interval -Avoid QT prolonging medications. -Repeat EKG with QTC of 509. -Replete electrolytes keep potassium >4, magnesium greater than 2. -Cardiology following.  5.  Odynophagia -Patient with complaints of difficulty swallowing which was noted by speech therapy associated with oral intake. -appreciate GI input -esophagram showed mild relative areas of narrowing at GE junction. Barium pill did not pass to the air level of the mid esophagus -Placed on IV PPI and Carafate -Underwent EGD on 2/6 which showed LA grade D esophagitis.  He also had an esophageal stricture that was dilated. -Clinically appears to be improving  6.  Hypertension -As needed hydralazine -Once tolerating oral intake can start on oral antihypertensive for better blood pressure management. -Cardiology following.  7.  Tobacco use disorder -Tobacco cessation.  8.  BPH -Resume home regimen Proscar.  9.  GERD -IV PPI. -Carafate  10.  Stage IIIa CKD -Stable.  11.  Diabetes mellitus type 2 -Hemoglobin A1c 6.1 (05/14/2021) -He is on Januvia as outpatient -currently on SSI  12. Hypokalemia/Hypomagnesemia -replaced  13. Paroxysmal A fib -Patient reports history of the same -This was also captured intermittently on telemetry during his hospital stay. -review of cardiology notes indicate that he did not want anticoagulation in the past -CHADSVASC of 6 (age(2), diabetes(1), hypertension(1), evidence of stroke(2)) -discussed anticoagulation with patient and he is agreeable.  Will start on Eliquis.  Disposition.  Will request TOC assistance since patient's primary care is no longer in the area.  He also may need help securing resources to assist with getting him appropriate  meals, since he does not drive and has limited vision.  DVT prophylaxis: Eliquis Code Status: Full Family Communication: Updated patient.  No family at bedside. Disposition:   Status is: Inpatient The patient will require care spanning > 2 midnights and should be moved to inpatient because: continue management of arrythmias and nausea/vomiting  Planned Discharge Destination: Home        Consultants:  Cardiology: Dr. Gardiner Rhyme 05/13/2021 GI, Dr, Benson [place] EP Dr. Lovena Le  Procedures: 2D echo 05/13/2021 Carotid Dopplers 05/13/2021 MRI brain 05/13/2021 Chest x-ray 05/13/2021 CT head without contrast 05/13/2021 Temporary transvenous pacemaker placement 05/19/2021 EGD 05/19/2021      Antimicrobials: None   Subjective: He reports that he has less pain in his chest with full liquids today.  Appears to be having less nausea and vomiting as well.  Objective: Vitals:   05/20/21 1500 05/20/21 1600 05/20/21 1700 05/20/21 1800  BP: (!) 168/56  (!) 144/64 (!) 130/55  Pulse: 65     Resp: 12  15 (!) 22  Temp:  98.3 F (36.8 C)    TempSrc:      SpO2: 99%     Weight:      Height:        Intake/Output Summary (Last 24 hours) at 05/20/2021 1842 Last data filed at 05/20/2021 1822 Gross per 24 hour  Intake 1500.93 ml  Output 600 ml  Net 900.93 ml   Filed Weights   05/13/21 0517  Weight: 70.3 kg    Examination:  General exam: Alert, awake, oriented x 3 Respiratory system: Clear to auscultation. Respiratory effort normal. Cardiovascular system:RRR. No murmurs, rubs, gallops. Gastrointestinal system: Abdomen is nondistended,  soft and nontender. No organomegaly or masses felt. Normal bowel sounds heard. Central nervous system: Alert and oriented. No focal neurological deficits. Extremities: No C/C/E, +pedal pulses Skin: No rashes, lesions or ulcers Psychiatry: Judgement and insight appear normal. Mood & affect appropriate.      Data Reviewed: I have personally reviewed following  labs and imaging studies  CBC: Recent Labs  Lab 05/15/21 0103 05/16/21 0329 05/18/21 0219 05/19/21 0614 05/20/21 0248  WBC 13.6* 11.3* 7.0 6.9 8.1  NEUTROABS 9.9*  --   --   --   --   HGB 11.9* 11.7* 11.2* 11.2* 11.3*  HCT 36.5* 35.7* 33.7* 34.6* 34.4*  MCV 92.2 91.5 91.8 92.5 93.2  PLT 165 168 155 185 220    Basic Metabolic Panel: Recent Labs  Lab 05/15/21 0103 05/16/21 0329 05/18/21 0219 05/19/21 0614 05/20/21 0248  NA 139 137 140 140 142  K 3.2* 4.2 3.4* 4.8 4.4  CL 103 107 107 107 112*  CO2 26 22 24 27 22   GLUCOSE 96 88 66* 156* 139*  BUN 18 26* 16 9 15   CREATININE 1.43* 1.36* 1.21 1.19 1.38*  CALCIUM 8.4* 7.8* 7.9* 8.3* 8.5*  MG 1.6* 2.6*  --   --   --     GFR: Estimated Creatinine Clearance: 42.5 mL/min (A) (by C-G formula based on SCr of 1.38 mg/dL (H)).  Liver Function Tests: No results for input(s): AST, ALT, ALKPHOS, BILITOT, PROT, ALBUMIN in the last 168 hours.   CBG: Recent Labs  Lab 05/19/21 1550 05/19/21 2208 05/20/21 0606 05/20/21 1134 05/20/21 1617  GLUCAP 111* 189* 159* 115* 126*     Recent Results (from the past 240 hour(s))  Resp Panel by RT-PCR (Flu A&B, Covid) Nasopharyngeal Swab     Status: None   Collection Time: 05/13/21  4:11 AM   Specimen: Nasopharyngeal Swab; Nasopharyngeal(NP) swabs in vial transport medium  Result Value Ref Range Status   SARS Coronavirus 2 by RT PCR NEGATIVE NEGATIVE Final    Comment: (NOTE) SARS-CoV-2 target nucleic acids are NOT DETECTED.  The SARS-CoV-2 RNA is generally detectable in upper respiratory specimens during the acute phase of infection. The lowest concentration of SARS-CoV-2 viral copies this assay can detect is 138 copies/mL. A negative result does not preclude SARS-Cov-2 infection and should not be used as the sole basis for treatment or other patient management decisions. A negative result may occur with  improper specimen collection/handling, submission of specimen other than  nasopharyngeal swab, presence of viral mutation(s) within the areas targeted by this assay, and inadequate number of viral copies(<138 copies/mL). A negative result must be combined with clinical observations, patient history, and epidemiological information. The expected result is Negative.  Fact Sheet for Patients:  EntrepreneurPulse.com.au  Fact Sheet for Healthcare Providers:  IncredibleEmployment.be  This test is no t yet approved or cleared by the [place] FDA and  has been authorized for detection and/or diagnosis of SARS-CoV-2 by FDA under an Emergency Use Authorization (EUA). This EUA will remain  in effect (meaning this test can be used) for the duration of the COVID-19 declaration under Section 564(b)(1) of the Act, 21 U.S.C.section 360bbb-3(b)(1), unless the authorization is terminated  or revoked sooner.       Influenza A by PCR NEGATIVE NEGATIVE Final   Influenza B by PCR NEGATIVE NEGATIVE Final    Comment: (NOTE) The Xpert Xpress SARS-CoV-2/FLU/RSV plus assay is intended as an aid in the diagnosis of influenza from Nasopharyngeal swab specimens and should not  be used as a sole basis for treatment. Nasal washings and aspirates are unacceptable for Xpert Xpress SARS-CoV-2/FLU/RSV testing.  Fact Sheet for Patients: EntrepreneurPulse.com.au  Fact Sheet for Healthcare Providers: IncredibleEmployment.be  This test is not yet approved or cleared by the [place] FDA and has been authorized for detection and/or diagnosis of SARS-CoV-2 by FDA under an Emergency Use Authorization (EUA). This EUA will remain in effect (meaning this test can be used) for the duration of the COVID-19 declaration under Section 564(b)(1) of the Act, 21 U.S.C. section 360bbb-3(b)(1), unless the authorization is terminated or revoked.  Performed at [facility], [place] [address], [place], [place] [address]   Surgical PCR screen     Status: None   Collection Time: 05/15/21  6:04 AM   Specimen: Nasal Mucosa; Nasal Swab  Result Value Ref Range Status   MRSA, PCR NEGATIVE NEGATIVE Final   Staphylococcus aureus NEGATIVE NEGATIVE Final    Comment: (NOTE) The Xpert SA Assay (FDA approved for NASAL specimens in patients 2 years of age and older), is one component of a comprehensive surveillance program. It is not intended to diagnose infection nor to guide or monitor treatment. Performed at [facility], [place] [address] [place], [place] [address]          Radiology Studies: CARDIAC CATHETERIZATION  Result Date: 05/19/2021 Successful placement of temporary transvenous pacemaker.  Set to back up rate 50 bpm with patient natively conducting in the 60s.  Can increase if desired at time of endoscopy.        Scheduled Meds:  Chlorhexidine Gluconate Cloth  6 each Topical Daily   feeding supplement  237 mL Oral TID WC   feeding supplement  237 mL Oral TID BM   finasteride  5 mg Oral Daily   insulin aspart  0-9 Units Subcutaneous TID WC   mouth rinse  15 mL Mouth Rinse BID   multivitamin with minerals  1 tablet Oral Daily   pantoprazole (PROTONIX) IV  40 mg Intravenous Q12H   sodium chloride flush  3 mL Intravenous Q12H   sodium chloride flush  3 mL Intravenous Q12H   sucralfate  1 g Oral TID WC & HS   Continuous Infusions:  sodium chloride Stopped (05/20/21 0846)     LOS: 6 days    Time spent: 18minutes    Kathie Dike, MD [facility]   To contact the attending provider between 7A-7P or the covering provider during after hours 7P-7A, please log into the web site [email or web address] and access using universal [place] password for that web site. If you do not have the password, please call the hospital operator.  05/20/2021, 6:42 PM

## 2021-05-20 NOTE — Progress Notes (Addendum)
PT Cancellation Note  Patient Details Name: DESIREE FLEMING MRN: [id number] DOB: April 15, 1940   Cancelled Treatment:    Reason Eval/Treat Not Completed: Other (comment) Just removed temp wire, will check back in.  Wyona Almas, PT, DPT Acute Rehabilitation Services Pager [phone] Office [phone]    Deno Etienne 05/20/2021, 8:35 AM

## 2021-05-20 NOTE — Plan of Care (Signed)
°  Problem: Education: Goal: Knowledge of General Education information will improve Description: Including pain rating scale, medication(s)/side effects and non-pharmacologic comfort measures Outcome: Progressing   Problem: Clinical Measurements: Goal: Will remain free from infection Outcome: Progressing Goal: Cardiovascular complication will be avoided Outcome: Progressing   Problem: Activity: Goal: Risk for activity intolerance will decrease Outcome: Progressing   Problem: Nutrition: Goal: Adequate nutrition will be maintained Outcome: Progressing   Problem: Coping: Goal: Level of anxiety will decrease Outcome: Progressing   Problem: Elimination: Goal: Will not experience complications related to bowel motility Outcome: Progressing Goal: Will not experience complications related to urinary retention Outcome: Progressing   Problem: Pain Managment: Goal: General experience of comfort will improve Outcome: Progressing   Problem: Safety: Goal: Ability to remain free from injury will improve Outcome: Progressing   Problem: Skin Integrity: Goal: Risk for impaired skin integrity will decrease Outcome: Progressing

## 2021-05-20 NOTE — Progress Notes (Signed)
OT Cancellation Note  Patient Details Name: Scott Willis MRN: [id number] DOB: [birth year]   Cancelled Treatment:    Reason Eval/Treat Not Completed:  (Per RN, groin sheath pulled about 8:50. Will return as schedule allows.)  [place], OTR/L Acute Rehab Pager: [phone] Office: [phone] 05/20/2021, 9:24 AM

## 2021-05-20 NOTE — Progress Notes (Addendum)
Nutrition Follow-up  DOCUMENTATION CODES:   Severe malnutrition in context of chronic illness  INTERVENTION:   Recommend diet advancement to solid food if able prior to discharge; if pt to remain on FL diet at discharge, pt will need to continue Ensure type supplement to meet nutritional needs  Ensure Enlive po TID with meals AND TID between meals, each supplement provides 350 kcal and 20 grams of protein.  IceCream on all meal trays; will order Magic Cup as well on meal trays just in case stock available as this would be an excellent supplement for this patient. However, Producer, television/film/video with Magic cup currently on backorder  Add MVI with Mineral daily  Obtain accurate weight   NUTRITION DIAGNOSIS:   Severe Malnutrition related to chronic illness (chronic vomiting, dysphagia/odynophagia) as evidenced by severe fat depletion, severe muscle depletion.  Being addressed via supplements  GOAL:   Patient will meet greater than or equal to 90% of their needs  MONITOR:   PO intake, Diet advancement, Supplement acceptance, Labs  REASON FOR ASSESSMENT:   Malnutrition Screening Tool    ASSESSMENT:   81 y.o. male with PMH of osteoarthritis, HTN, blindness, type II DM, HLD, nephrolithiasis, macular degeneration, osteoporosis gastric ulcer,presented to ED with complaints of persistent nausea and vomiting.  2/06 EGD: strictures, significant esophagitis  Pt with sinus bradycardia and concomitant heart block 2/2 hypervagatonia  Pt with odynophagia as well as persistent vomiting. Diet downgraded to FL.   On visit at lunch today, pt reports pain and Vomiting/Reflex continues but pt is utilizing different strategies to help maximize nutritional intake. Pt reports ice cream helps cool his throat and helps ease swallowing. Pt reports he will take a bite or 2 of ice cream and then eat something and then repeat. No ice cream on lunch tray. RD obtained from nourishment refrigerator and plan  to add to all meal trays.   Pt also reports similar situation with regards Ensure. Pt takes sips throughout the day of COLD Ensure to help ease the pain but improve nutrition. Pt states he asked for 2 Ensures around lunch time and was told he could not have them as he already had 1 and he only had a total of 3 per day. RD to modify Ensure orders to allow more frequent administration.   Pt also indicates his had his first BM of this admission this morning. Constipation resolved.   No weight since admission   Labs: reviewed Meds: ss novolog, carafate   Diet Order:   Diet Order             Diet full liquid Room service appropriate? Yes; Fluid consistency: Thin  Diet effective now                   EDUCATION NEEDS:   Education needs have been addressed  Skin:  Skin Assessment: Reviewed RN Assessment (per WOC: right trochanter erythematous lesion measures 2 cm x 1.8 cm with 0.3 cm scabbed center)  Last BM:  2/7  Height:   Ht Readings from Last 1 Encounters:  05/13/21 5' 10.5" (1.791 m)    Weight:   Wt Readings from Last 1 Encounters:  05/13/21 70.3 kg    Ideal Body Weight:  78.2 kg  BMI:  Body mass index is 21.93 kg/m.  Estimated Nutritional Needs:   Kcal:  1800-2000 kcal/d  Protein:  90-100 g/d  Fluid:  1.8-2L/d   Kerman Passey MS, RDN, LDN, CNSC Registered Dietitian III Clinical Nutrition RD Pager  and Forensic scientist Number Located in [place]

## 2021-05-20 NOTE — Progress Notes (Signed)
[place] for apixaban Indication: atrial fibrillation   Labs: Recent Labs    05/18/21 0219 05/19/21 0614 05/20/21 0248  HGB 11.2* 11.2* 11.3*  HCT 33.7* 34.6* 34.4*  PLT 155 185 174  CREATININE 1.21 1.19 1.38*    Assessment: 83 yom with hx of afib not on anticoagulation PTA (reportedly due to patient declining), continued to be intermittently in afib this admission. MD discussed with patient and now agreeable to anticoagulation. Pharmacy consulted to start apixaban. SCr up a bit to 1.38 today (baseline appears to be 1.2-1.5). CBC stable. No bleed issues reported. SCDs only ordered for VTE prophylaxis so far this admission. Wt 70.3 kg  Goal of Therapy:  Stroke prevention Monitor platelets by anticoagulation protocol: Yes   Plan:  Apixaban 5mg  PO BID Monitor CBC, SCr, s/sx bleeding   Arturo Morton, PharmD, BCPS Clinical Pharmacist 05/20/2021 7:21 PM

## 2021-05-20 NOTE — Progress Notes (Signed)
Speech Language Pathology Treatment: Cognitive-Linquistic  Patient Details Name: Scott Willis MRN: [id number] DOB: Jun 06, 1940 Today's Date: 05/20/2021 Time: 4270-6237 SLP Time Calculation (min) (ACUTE ONLY): 25 min  Assessment / Plan / Recommendation Clinical Impression  Patient seen by SLP with focus on speech goals. Patient sitting up in recliner and finishing his dinner meal when SLP arrived. He reported that his speech is "back to normal". SLP observed patient during unstructured conversation and speech was 100% intelligible throughout 20 minute session. Patient continues to appear cognitively intact and is very involved in his medical care. 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 As patient is no long presenting with dysarthria and swallow goals have been discharged, SLP to s/o at this time and not recommending f/u ST services at next venue of care.    HPI HPI: Patient is an 81 y.o. male with PMH: osteoarthritis, blindness, BPH, DM-2, HLD, macular degeneration, osteoporosis, gastric ulcer, near syncopal episode who arrived at ER with persistent nausea and multiple episodes of emesis since yesterday. Patient denied fever but endorsed chills and decreased appetite. In ER he had some sinus pauses, positional lightheadness. He was afebrileand oxygen saturations at 95% on RA. MRI revealed: no acute intracranial pathology.  small remote infarct in the left pons corresponding to the finding on the CT. Patient awaiting transfer to [facility] for evaluation by electrophysiology as per cardiology request.      SLP Plan  Discharge SLP treatment due to (comment) (goals met)      Recommendations for follow up therapy are one component of a multi-disciplinary discharge planning process, led by the attending physician.   Recommendations may be updated based on patient status, additional functional criteria and insurance authorization.    Recommendations                   Follow Up Recommendations: No SLP follow up Assistance recommended at discharge: None SLP Visit Diagnosis: Dysarthria and anarthria (R47.1) Plan: Discharge SLP treatment due to (comment) (goals met)          Sonia Baller, MA, CCC-SLP Speech Therapy

## 2021-05-20 NOTE — Progress Notes (Signed)
°   05/20/21 1540  Clinical Encounter Type  Visited With Patient;Health care provider  Visit Type Initial;Social support  Referral From Physician  Consult/Referral To Chaplain  Stress Factors  Patient Stress Factors Health changes   Initial consultation with [name] per request of physician. Patient is a lifelong [name] that holds [employer]. Patient stated that his masonic chaplain was in to visit with him earlier this afternoon. Mr. 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 Patient was very up-beat, but shared some frustration relating to his prolonged illness and not being able to identify the root cause of his heart irregularities. Length of visit 60 minutes total. [address], [place]. [phone].

## 2021-05-20 NOTE — Progress Notes (Signed)
Occupational Therapy Treatment Patient Details Name: Scott Willis MRN: [id number] DOB: 1941/02/12 Today's Date: 05/20/2021   History of present illness 81 y.o. male presenting to [facility] ED 1/31 with intractableN/V x1 day. MRI brain shows no acute abnormalities, small remote L pons infarct.. Patient admitted with symtomatic bradycardia with pauses as long as 7 seconds. Initially planned for PPM but per GI, pt with possible "swallow syncope" which is likely cause of pauses. Cardiology does not plan for PPM at this time with further GI work-up pending. PMHx signficant for OA, HTN, L eye blindness, lumbago, BPH, DMII, HLD, nephrolithiasis, and osteoporosis.   OT comments  Pt progressing towards established OT goals. Pt donning socks while sitting at EOB with figure four method. Pt performing grooming at sink with Min guard A. Pt performing functional mobility in hallway with Min Guard A; slow and cautious movement. Continue to recommend dc to home once medically stable and will continue to follow acutely as admitted.    Recommendations for follow up therapy are one component of a multi-disciplinary discharge planning process, led by the attending physician.  Recommendations may be updated based on patient status, additional functional criteria and insurance authorization.    Follow Up Recommendations  No OT follow up    Assistance Recommended at Discharge PRN  Patient can return home with the following  Assist for transportation   Equipment Recommendations  None recommended by OT    Recommendations for Other Services      Precautions / Restrictions Precautions Precautions: Fall Precaution Comments: monitor HR (hx of pauses), BP (elevated); legally blind       Mobility Bed Mobility Overal bed mobility: Modified Independent                  Transfers Overall transfer level: Needs assistance Equipment used: None Transfers: Sit to/from Stand Sit to Stand: Min guard            General transfer comment: Min Guard A for safety     Balance Overall balance assessment: Mild deficits observed, not formally tested                                         ADL either performed or assessed with clinical judgement   ADL Overall ADL's : Needs assistance/impaired     Grooming: Brushing hair;Min guard;Standing Grooming Details (indicate cue type and reason): Pt finishing washing his hair with shower cap while standing at sink with Min guard A. Pt then brushing his hair. Two episodes of slight LOB and pt able to correct             Lower Body Dressing: Min guard;Sit to/from stand Lower Body Dressing Details (indicate cue type and reason): using figure four to don socks Toilet Transfer: Min guard;Ambulation (simulated to recliner)           Functional mobility during ADLs: Min guard General ADL Comments: Pt performing grooming at sink and then functional mobility in hallway    Extremity/Trunk Assessment Upper Extremity Assessment Upper Extremity Assessment: Overall WFL for tasks assessed   Lower Extremity Assessment Lower Extremity Assessment: Defer to PT evaluation        Vision   Additional Comments: legally blind. uses peripheral vision. more vision in R eye   Perception     Praxis      Cognition Arousal/Alertness: Awake/alert Behavior During Therapy: [facility] for tasks  assessed/performed Overall Cognitive Status: Within Functional Limits for tasks assessed                                 General Comments: Slightly impulsive with decreased safety awareness - however, feel this is baseline function        Exercises      Shoulder Instructions       General Comments VSS on RA    Pertinent Vitals/ Pain       Pain Assessment Pain Assessment: Faces Faces Pain Scale: No hurt Pain Intervention(s): Monitored during session  Home Living                                           Prior Functioning/Environment              Frequency  Min 2X/week        Progress Toward Goals  OT Goals(current goals can now be found in the care plan section)  Progress towards OT goals: Progressing toward goals  Acute Rehab OT Goals OT Goal Formulation: With patient Time For Goal Achievement: 05/30/21 Potential to Achieve Goals: Good ADL Goals Pt Will Transfer to Toilet: Independently;ambulating Additional ADL Goal #1: Pt to increase activity tolerance > 10 min during ADLs/IADLs Additional ADL Goal #2: Pt to demo ability to gather ADL/IADL items MOD I without LOB or safety concerns  Plan Discharge plan remains appropriate    Co-evaluation                 AM-PAC OT "6 Clicks" Daily Activity     Outcome Measure   Help from another person eating meals?: None Help from another person taking care of personal grooming?: None Help from another person toileting, which includes using toliet, bedpan, or urinal?: A Little Help from another person bathing (including washing, rinsing, drying)?: A Little Help from another person to put on and taking off regular upper body clothing?: None Help from another person to put on and taking off regular lower body clothing?: A Little 6 Click Score: 21    End of Session Equipment Utilized During Treatment: Gait belt  OT Visit Diagnosis: Other abnormalities of gait and mobility (R26.89)   Activity Tolerance Patient tolerated treatment well   Patient Left in chair;with call bell/phone within reach;with chair alarm set   Nurse Communication Mobility status        Time: 3474-2595 OT Time Calculation (min): 26 min  Charges: OT General Charges $OT Visit: 1 Visit OT Treatments $Self Care/Home Management : 23-37 mins  [place], OTR/L Acute Rehab Pager: [phone] Office: [place] 05/20/2021, 4:38 PM

## 2021-05-20 NOTE — Care Management (Signed)
°  Transition of Care [facility]) Screening Note   Patient Details  Name: Scott Willis Date of Birth: 02-14-1941   Transition of Care [facility] CM/SW Contact:    Bethena Roys, RN Phone Number: 05/20/2021, 3:37 PM    Transition of Care Department [facility]) has reviewed the patient and no TOC needs have been identified at this time. Patient continues to be on IV Protonix and Carafate. Case Manager will continue to follow the patient for additional transition of care needs as he progresses.

## 2021-05-20 NOTE — Progress Notes (Signed)
Electrophysiology Rounding Note  Patient Name: Scott Willis Date of Encounter: 05/20/2021  Primary Cardiologist: Buford Dresser, MD Electrophysiologist: None   Subjective   The patient is doing well today.  At this time, the patient denies chest pain, shortness of breath, or any new concerns.  Inpatient Medications    Scheduled Meds:  Chlorhexidine Gluconate Cloth  6 each Topical Daily   feeding supplement (GLUCERNA SHAKE)  237 mL Oral TID BM   finasteride  5 mg Oral Daily   insulin aspart  0-9 Units Subcutaneous TID WC   mouth rinse  15 mL Mouth Rinse BID   pantoprazole (PROTONIX) IV  40 mg Intravenous Q12H   sodium chloride flush  3 mL Intravenous Q12H   sodium chloride flush  3 mL Intravenous Q12H   sucralfate  1 g Oral TID WC & HS   Continuous Infusions:  sodium chloride 10 mL/hr at 05/20/21 0700   PRN Meds: acetaminophen **OR** acetaminophen, diclofenac Sodium, hydrALAZINE, hydrocortisone cream, LORazepam, polyvinyl alcohol, prochlorperazine   Vital Signs    Vitals:   05/20/21 0500 05/20/21 0600 05/20/21 0700 05/20/21 0803  BP: (!) 137/59 97/68 103/71   Pulse: 61 (!) 58 68   Resp: (!) 23 (!) 22 13   Temp:    98.3 F (36.8 C)  TempSrc:    Oral  SpO2: 93% 95% 93%   Weight:      Height:        Intake/Output Summary (Last 24 hours) at 05/20/2021 0806 Last data filed at 05/20/2021 0700 Gross per 24 hour  Intake 908.51 ml  Output 1400 ml  Net -491.49 ml   Filed Weights   05/13/21 0517  Weight: 70.3 kg    Physical Exam    GEN- The patient is well appearing, alert and oriented x 3 today.   Head- normocephalic, atraumatic Eyes-  Sclera clear, conjunctiva pink Ears- hearing intact Oropharynx- clear Neck- supple Lungs- Clear to ausculation bilaterally, normal work of breathing Heart- Regular rate and rhythm, no murmurs, rubs or gallops GI- soft, NT, ND, + BS Extremities- no clubbing or cyanosis. No edema Skin- no rash or lesion Psych-  euthymic mood, full affect Neuro- strength and sensation are intact  Labs    CBC Recent Labs    05/19/21 0614 05/20/21 0248  WBC 6.9 8.1  HGB 11.2* 11.3*  HCT 34.6* 34.4*  MCV 92.5 93.2  PLT 185 211   Basic Metabolic Panel Recent Labs    05/19/21 0614 05/20/21 0248  NA 140 142  K 4.8 4.4  CL 107 112*  CO2 27 22  GLUCOSE 156* 139*  BUN 9 15  CREATININE 1.19 1.38*  CALCIUM 8.3* 8.5*   Liver Function Tests No results for input(s): AST, ALT, ALKPHOS, BILITOT, PROT, ALBUMIN in the last 72 hours. No results for input(s): LIPASE, AMYLASE in the last 72 hours. Cardiac Enzymes No results for input(s): CKTOTAL, CKMB, CKMBINDEX, TROPONINI in the last 72 hours.   Telemetry    NSR 60-70s, several paced beats yesterday but otherwise stable (personally reviewed)  Radiology    CARDIAC CATHETERIZATION  Result Date: 05/19/2021 Successful placement of temporary transvenous pacemaker.  Set to back up rate 50 bpm with patient natively conducting in the 60s.  Can increase if desired at time of endoscopy.    Patient Profile     81 y.o. male with a hx of HTN, DM, smoker, chronic back pain, HLD, blind L eye, peripheral vision only in his left,  AF admitted  initially to [facility] w/intractable N/V found to have episodes of AV block and pausing, transferred to [facility] for EP/possible pacer. No indication for permanent pacing per GT   AVblock thought 2/2 hypervagatonia related to retching and esophogram concerning for obstruction with evaluation pending    Thromboembolic risk factors ( age  -2, HTN-1) for a CHADSVASc Score of >= 3; disucssions re anticoagulation in the past , and currently will hold off on discussions pending workup   Assessment & Plan    Sinus bradycardia and concomitant heart block 2/2 hypervagatonia No indication for permament pacing No sustained pacing OK to remove temp wire.    2.  Odynophagia Severe  EGD 2/6 with strictures and significant esophagitis.  Pt now  tolerating full liquid diet.    3. Paroxysmal atrial fibrillation Pt reports history of the same. CHA2DS2/VASc is at least 6 with age-indeterminate CVA noted on CT.  Has previously refused Bolan.   Would observe HRs for at least one more day. OK to move back out of unit once pacer removed.   For questions or updates, please contact [place] Please consult [email or web address] for contact info under Cardiology/STEMI.  Signed, Shirley Friar, PA-C  05/20/2021, 8:06 AM

## 2021-05-20 NOTE — Discharge Instructions (Signed)

## 2021-05-21 LAB — GLUCOSE, CAPILLARY
Glucose-Capillary: 197 mg/dL — ABNORMAL HIGH (ref 70–99)
Glucose-Capillary: 133 mg/dL — ABNORMAL HIGH (ref 70–99)
Glucose-Capillary: 147 mg/dL — ABNORMAL HIGH (ref 70–99)

## 2021-05-21 NOTE — Care Management (Signed)
1400 05-21-21 Case Manager received a consult that the patient needs assistance establishing a PCP. Case Manager spoke with patient and he states prior to arrival he was independent. 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 Patient states he does not drive and has a friend that takes him to all appointments. Patient states he eats one meal a day and 2 snacks- he declined information on Meals on Wheels. Patient states his previous PCP has moved to [place] and is in need of a new one. Case Manager discussed the Internal Medicine Clinic- patient is agreeable to an appointment. Case Manager scheduled the appointment and information is on the AVS. Case Manager did make the patient aware that if the clinic is not a good fit for him to call [facility] for assistance with new provider. No further needs identified at this time.

## 2021-05-21 NOTE — Progress Notes (Signed)
Subjective: Patient complains of regurgitating most of the liquids he is ingesting, He denies having any abdominal pain. ,  Objective: Vital signs in last 24 hours: Temp:  [98.2 F (36.8 C)-98.8 F (37.1 C)] 98.2 F (36.8 C) (02/08 0400) Pulse Rate:  [58-76] 76 (02/08 0600) Resp:  [12-27] 19 (02/08 0600) BP: (107-168)/(43-84) 137/63 (02/08 0600) SpO2:  [90 %-99 %] 96 % (02/08 0600) Last BM Date: 05/20/21  Intake/Output from previous day: 02/07 0701 - 02/08 0700 In: 1154.8 [P.O.:1137; I.V.:17.8] Out: 1000 [Urine:1000] Intake/Output this shift: No intake/output data recorded.  General appearance: alert, cooperative, appears stated age, and no distress Resp: clear to auscultation bilaterally Cardio: regular rate and rhythm, S1, S2 normal, no murmur, click, rub or gallop GI: soft, non-tender; bowel sounds normal; no masses,  no organomegaly  Lab Results: Recent Labs    05/19/21 0614 05/20/21 0248  WBC 6.9 8.1  HGB 11.2* 11.3*  HCT 34.6* 34.4*  PLT 185 174   BMET Recent Labs    05/19/21 0614 05/20/21 0248  NA 140 142  K 4.8 4.4  CL 107 112*  CO2 27 22  GLUCOSE 156* 139*  BUN 9 15  CREATININE 1.19 1.38*  CALCIUM 8.3* 8.5*   Studies/Results: CARDIAC CATHETERIZATION  Result Date: 05/19/2021 Successful placement of temporary transvenous pacemaker.  Set to back up rate 50 bpm with patient natively conducting in the 60s.  Can increase if desired at time of endoscopy.    Medications: I have reviewed the patient's current medications. Prior to Admission:  Medications Prior to Admission  Medication Sig Dispense Refill Last Dose   Cholecalciferol (VITAMIN D-3) 125 MCG (5000 UT) TABS Take 5,000 Units by mouth daily.   05/12/2021   diazepam (VALIUM) 5 MG tablet Take 5 mg by mouth See admin instructions. Take 5 mg by mouth as directed before right eye injection every 10 weeks   05/09/2021   diclofenac sodium (VOLTAREN) 1 % GEL Apply 4 g topically 4 (four) times daily as  needed. (Patient taking differently: Apply 4 g topically 4 (four) times daily as needed (for pain).) 500 g 6 unk   finasteride (PROSCAR) 5 MG tablet TAKE 1 TABLET BY MOUTH EVERY DAY (Patient taking differently: Take 5 mg by mouth daily.) 90 tablet 3 05/12/2021   JANUVIA 100 MG tablet TAKE 1 TABLET BY MOUTH EVERY DAY (Patient taking differently: Take 100 mg by mouth daily.) 90 tablet 1 05/12/2021   Magnesium Oxide 400 (240 Mg) MG TABS TAKE 1 TABLET BY MOUTH EVERY DAY (Patient taking differently: Take 400 mg by mouth daily.) 90 tablet 3 05/12/2021   Multiple Vitamins-Minerals (PRESERVISION AREDS 2 PO) Take 1 capsule by mouth daily.   05/12/2021   mupirocin ointment (BACTROBAN) 2 % Apply 1 application topically 2 (two) times daily. (Patient taking differently: Apply 1 application topically See admin instructions. Apply to the right hip once a day for wound care) 22 g 1 05/12/2021   oxyCODONE-acetaminophen (PERCOCET/ROXICET) 5-325 MG tablet Take 1-2 tablets by mouth every 6 (six) hours as needed for severe pain. (Patient taking differently: Take 1 tablet by mouth See admin instructions. Take 1 tablet by mouth as directed before right eye injection every 10 weeks) 30 tablet 0 Past Week   SYSTANE COMPLETE 0.6 % SOLN Place 1 drop into both eyes 3 (three) times daily as needed (for irritation).   unk   tamsulosin (FLOMAX) 0.4 MG CAPS capsule TAKE 1 CAPSULE BY MOUTH EVERY DAY (Patient taking differently: Take 0.4 mg by  mouth daily.) 90 capsule 3 05/12/2021   trimethoprim-polymyxin b (POLYTRIM) ophthalmic solution Place 1 drop into the right eye See admin instructions. Instill 1 drop into the right eye four times a day for 2 days after eye injection every 10 weeks   05/11/2021   blood glucose meter kit and supplies KIT Check CBG QID 1 each 0    polyethylene glycol (GOLYTELY) 236 g solution Use once daily until bowels are moving normally. (Patient not taking: Reported on 05/13/2021) 4000 mL 11 Not Taking   Prodigy Twist  Top Lancets 28G MISC       venlafaxine (EFFEXOR) 75 MG tablet Take 1 tablet (75 mg total) by mouth 2 (two) times daily. (Patient not taking: Reported on 05/13/2021) 180 tablet 1 Not Taking   Scheduled:  apixaban  5 mg Oral BID   Chlorhexidine Gluconate Cloth  6 each Topical Daily   feeding supplement  237 mL Oral TID WC   feeding supplement  237 mL Oral TID BM   finasteride  5 mg Oral Daily   insulin aspart  0-9 Units Subcutaneous TID WC   mouth rinse  15 mL Mouth Rinse BID   multivitamin with minerals  1 tablet Oral Daily   pantoprazole (PROTONIX) IV  40 mg Intravenous Q12H   sodium chloride flush  3 mL Intravenous Q12H   sodium chloride flush  3 mL Intravenous Q12H   sucralfate  1 g Oral TID WC & HS   Assessment/Plan: 1) Intractable nausea and vomiting-LA grade D esophagitis noted on recent endoscopy with multiple stricture dilated with a Savary dilator.   2) Swallow syncope continue present care. 3) Severe protein calorie malnutrition. 4) CKD. 5) AODM. 6) History of paroxysmal atrial fibrillation.  LOS: 7 days   Juanita Craver 05/21/2021, 7:03 AM

## 2021-05-21 NOTE — Progress Notes (Signed)
Physical Therapy Treatment Patient Details Name: Scott Willis MRN: [id number] DOB: [birth year] Today's Date: 05/21/2021   History of Present Illness 81 y.o. male presenting to [facility] ED 1/31 with intractableN/V x1 day. MRI brain shows no acute abnormalities, small remote L pons infarct.. Patient admitted with symtomatic bradycardia with pauses as long as 7 seconds. Initially planned for PPM but per GI, pt with possible "swallow syncope" which is likely cause of pauses. Cardiology does not plan for PPM at this time with further GI work-up pending. PMHx signficant for OA, HTN, L eye blindness, lumbago, BPH, DMII, HLD, nephrolithiasis, and osteoporosis.    PT Comments    Pt pleasant and agreeable to therapy session. Overall, is mobilizing fairly well. Ambulating 370 feet with no assistive device or physical assist. SpO2 95% on RA, HR 64-76 bpm. Pt reports slower gait speed in comparison to baseline. No follow up PT needs indicated.    Recommendations for follow up therapy are one component of a multi-disciplinary discharge planning process, led by the attending physician.  Recommendations may be updated based on patient status, additional functional criteria and insurance authorization.  Follow Up Recommendations  No PT follow up     Assistance Recommended at Discharge PRN  Patient can return home with the following     Equipment Recommendations  None recommended by PT    Recommendations for Other Services       Precautions / Restrictions Precautions Precautions: Fall Restrictions Weight Bearing Restrictions: No     Mobility  Bed Mobility Overal bed mobility: Modified Independent                  Transfers Overall transfer level: Independent Equipment used: None                    Ambulation/Gait Ambulation/Gait assistance: Supervision Gait Distance (Feet): 370 Feet Assistive device: None Gait Pattern/deviations: Step-to pattern, Step-through  pattern, Decreased stride length Gait velocity: decreased     General Gait Details: Pt with alternating step to vs step through pattern, reports speed is not baseline, supervision for safety   Stairs             Wheelchair Mobility    Modified Rankin (Stroke Patients Only)       Balance Overall balance assessment: Mild deficits observed, not formally tested                                          Cognition Arousal/Alertness: Awake/alert Behavior During Therapy: WFL for tasks assessed/performed Overall Cognitive Status: Within Functional Limits for tasks assessed                                          Exercises      General Comments        Pertinent Vitals/Pain Pain Assessment Pain Assessment: No/denies pain    Home Living                          Prior Function            PT Goals (current goals can now be found in the care plan section) Acute Rehab PT Goals Patient Stated Goal: return home asap Potential to Achieve Goals: Good Progress towards PT  goals: Progressing toward goals    Frequency    Min 2X/week      PT Plan Current plan remains appropriate    Co-evaluation              AM-PAC PT "6 Clicks" Mobility   Outcome Measure  Help needed turning from your back to your side while in a flat bed without using bedrails?: None Help needed moving from lying on your back to sitting on the side of a flat bed without using bedrails?: None Help needed moving to and from a bed to a chair (including a wheelchair)?: None Help needed standing up from a chair using your arms (e.g., wheelchair or bedside chair)?: None Help needed to walk in hospital room?: A Little Help needed climbing 3-5 steps with a railing? : A Little 6 Click Score: 22    End of Session   Activity Tolerance: Patient tolerated treatment well Patient left: in bed;with call bell/phone within reach Nurse Communication:  Mobility status PT Visit Diagnosis: Unsteadiness on feet (R26.81);Other abnormalities of gait and mobility (R26.89)     Time: 2376-2831 PT Time Calculation (min) (ACUTE ONLY): 18 min  Charges:  $Therapeutic Activity: 8-22 mins                     Wyona Almas, PT, DPT Acute Rehabilitation Services Pager [phone] Office [phone]    Deno Etienne 05/21/2021, 1:33 PM

## 2021-05-21 NOTE — Progress Notes (Signed)
Pt informed of movement to 6E. Pt packed up belongings, report given to [facility] on 6E. Pt taken in wheelchair with all belongings to 6E10. RN and NT in room assisted pt to bed and confirmed tele box.

## 2021-05-21 NOTE — Progress Notes (Addendum)
Bradycardia has resolved with management of GI issues.   EP to see as needed. Please call with questions.   Follow up with his gen cardiologist on discharge. Request sent for appt.   Legrand Como [address]" [place], PA-C  05/21/2021 8:20 AM

## 2021-05-22 ENCOUNTER — Other Ambulatory Visit (HOSPITAL_COMMUNITY): Payer: Self-pay

## 2021-05-22 ENCOUNTER — Encounter (HOSPITAL_COMMUNITY): Payer: Self-pay | Admitting: Gastroenterology

## 2021-05-22 LAB — BASIC METABOLIC PANEL
Anion gap: 9 (ref 5–15)
BUN: 29 mg/dL — ABNORMAL HIGH (ref 8–23)
CO2: 28 mmol/L (ref 22–32)
Calcium: 9.5 mg/dL (ref 8.9–10.3)
Chloride: 100 mmol/L (ref 98–111)
Creatinine, Ser: 1.39 mg/dL — ABNORMAL HIGH (ref 0.61–1.24)
GFR, Estimated: 51 mL/min — ABNORMAL LOW (ref >60)
Glucose, Bld: 178 mg/dL — ABNORMAL HIGH (ref 70–99)
Potassium: 4.7 mmol/L (ref 3.5–5.1)
Sodium: 137 mmol/L (ref 135–145)

## 2021-05-22 LAB — CBC
HCT: 34 % — ABNORMAL LOW (ref 39.0–52.0)
Hemoglobin: 11.2 g/dL — ABNORMAL LOW (ref 13.0–17.0)
MCH: 30.9 pg (ref 26.0–34.0)
MCHC: 32.9 g/dL (ref 30.0–36.0)
MCV: 93.7 fL (ref 80.0–100.0)
Platelets: 184 10*3/uL (ref 150–400)
RBC: 3.63 MIL/uL — ABNORMAL LOW (ref 4.22–5.81)
RDW: 13.2 % (ref 11.5–15.5)
WBC: 8.9 10*3/uL (ref 4.0–10.5)
nRBC: 0 % (ref 0.0–0.2)

## 2021-05-22 LAB — GLUCOSE, CAPILLARY
Glucose-Capillary: 181 mg/dL — ABNORMAL HIGH (ref 70–99)
Glucose-Capillary: 190 mg/dL — ABNORMAL HIGH (ref 70–99)
Glucose-Capillary: 169 mg/dL — ABNORMAL HIGH (ref 70–99)
Glucose-Capillary: 156 mg/dL — ABNORMAL HIGH (ref 70–99)

## 2021-05-22 MED ORDER — PANTOPRAZOLE SODIUM 40 MG PO TBEC
40.0000 mg | DELAYED_RELEASE_TABLET | Freq: Two times a day (BID) | ORAL | Status: DC
  Administered 2021-05-22 – 2021-05-24 (×4): 40 mg via ORAL
  Filled 2021-05-22 (×5): qty 1

## 2021-05-22 NOTE — Progress Notes (Signed)
Subjective: He did not regurgitate or vomit his breakfast.  No pain with PO intake.  Objective: Vital signs in last 24 hours: Temp:  [97.8 F (36.6 C)-98.5 F (36.9 C)] 97.8 F (36.6 C) (02/09 0736) Pulse Rate:  [60-72] 72 (02/09 0736) Resp:  [16] 16 (02/09 0736) BP: (104-167)/(45-77) 125/77 (02/09 0736) SpO2:  [95 %-99 %] 99 % (02/09 0736) Last BM Date: 05/21/21  Intake/Output from previous day: 02/08 0701 - 02/09 0700 In: 480 [P.O.:480] Out: 450 [Urine:450] Intake/Output this shift: No intake/output data recorded.  General appearance: alert and no distress GI: soft, non-tender; bowel sounds normal; no masses,  no organomegaly  Lab Results: Recent Labs    05/20/21 0248 05/22/21 0304  WBC 8.1 8.9  HGB 11.3* 11.2*  HCT 34.4* 34.0*  PLT 174 184   BMET Recent Labs    05/20/21 0248 05/22/21 0304  NA 142 137  K 4.4 4.7  CL 112* 100  CO2 22 28  GLUCOSE 139* 178*  BUN 15 29*  CREATININE 1.38* 1.39*  CALCIUM 8.5* 9.5   LFT No results for input(s): PROT, ALBUMIN, AST, ALT, ALKPHOS, BILITOT, BILIDIR, IBILI in the last 72 hours. PT/INR No results for input(s): LABPROT, INR in the last 72 hours. Hepatitis Panel No results for input(s): HEPBSAG, HCVAB, HEPAIGM, HEPBIGM in the last 72 hours. C-Diff No results for input(s): CDIFFTOX in the last 72 hours. Fecal Lactopherrin No results for input(s): FECLLACTOFRN in the last 72 hours.  Studies/Results: No results found.  Medications: Scheduled:  apixaban  5 mg Oral BID   Chlorhexidine Gluconate Cloth  6 each Topical Daily   feeding supplement  237 mL Oral TID WC   feeding supplement  237 mL Oral TID BM   finasteride  5 mg Oral Daily   insulin aspart  0-9 Units Subcutaneous TID WC   mouth rinse  15 mL Mouth Rinse BID   multivitamin with minerals  1 tablet Oral Daily   pantoprazole  40 mg Oral BID   sodium chloride flush  3 mL Intravenous Q12H   sodium chloride flush  3 mL Intravenous Q12H   sucralfate  1 g Oral  TID WC & HS   Continuous:  sodium chloride Stopped (05/20/21 0846)    Assessment/Plan: 1) LA Grade D esophagitis. 2) Swallow syncope.   There does appear to be some improvement with his symptoms in that he did not vomit his breakfast.  The anticipation is that he will continue to improve.  Plan: 1) Continue with PPI BID and sucralfate. 2) Once he can tolerate a substantial portion of his PO he can be discharged home.  LOS: 8 days   Kail Fraley D 05/22/2021, 2:32 PM

## 2021-05-22 NOTE — Plan of Care (Signed)
°  Problem: Education: Goal: Knowledge of General Education information will improve Description: Including pain rating scale, medication(s)/side effects and non-pharmacologic comfort measures Outcome: Progressing   Problem: Clinical Measurements: Goal: Will remain free from infection Outcome: Progressing Goal: Cardiovascular complication will be avoided Outcome: Progressing   Problem: Activity: Goal: Risk for activity intolerance will decrease Outcome: Progressing   Problem: Nutrition: Goal: Adequate nutrition will be maintained Outcome: Progressing   Problem: Coping: Goal: Level of anxiety will decrease Outcome: Progressing   Problem: Elimination: Goal: Will not experience complications related to bowel motility Outcome: Progressing Goal: Will not experience complications related to urinary retention Outcome: Progressing   Problem: Pain Managment: Goal: General experience of comfort will improve Outcome: Progressing   Problem: Safety: Goal: Ability to remain free from injury will improve Outcome: Progressing   Problem: Skin Integrity: Goal: Risk for impaired skin integrity will decrease Outcome: Progressing

## 2021-05-22 NOTE — Assessment & Plan Note (Signed)
Glucoses followed with FSBS and SSI.

## 2021-05-22 NOTE — Progress Notes (Signed)
Heart rate 87 when standing and walking.

## 2021-05-22 NOTE — Assessment & Plan Note (Addendum)
Pt continues to have nausea and vomiting, even after stricture was dilated. He states that his heart rate drops with emesis as well. Continue to monitor. EP cardiology has signed off. I have discussed the patient with GI. Will add Reglan 10 mg tid with meals.  The patient has kept down his meals for the last 24 hours. He will be discharged to home.

## 2021-05-22 NOTE — Assessment & Plan Note (Signed)
Continue proscar and flomax as at home.

## 2021-05-22 NOTE — Assessment & Plan Note (Addendum)
°  Age indeterminate pontine CVA on Ct head. MRI suggested was performed and confirms remote small pontine infarct, but no acute pathology.

## 2021-05-22 NOTE — Assessment & Plan Note (Signed)
Nicotine patch offered and 7-10 minutes of tobacco cessation counseling given.

## 2021-05-22 NOTE — Assessment & Plan Note (Signed)
Blood pressures controlled on as needed hydralazine.

## 2021-05-22 NOTE — Care Management Important Message (Signed)
Important Message  Patient Details  Name: Scott Willis MRN: [id number] Date of Birth: 08-04-1940   Medicare Important Message Given:  Yes     [name] 05/22/2021, 9:43 AM

## 2021-05-22 NOTE — Assessment & Plan Note (Addendum)
Due to vomiting and vagal reflex. Patient with bradycardia with continued emesis. Monitor and avoid nodally acting reagents.  The patient has had no problem with his meals and keeping them down for the past 24 hours.

## 2021-05-22 NOTE — Assessment & Plan Note (Signed)
Will recheck EKG.

## 2021-05-22 NOTE — Assessment & Plan Note (Addendum)
Baseline creatinine 1.3 - 1.5. 1.59 today. Monitor creatinine, electrolytes, and volume status.

## 2021-05-22 NOTE — Assessment & Plan Note (Signed)
Continue protonix  

## 2021-05-22 NOTE — Progress Notes (Signed)
PROGRESS NOTE  Scott Willis LOV:[id number] DOB: 12/15/1940 DOA: 05/13/2021 PCP: Eunice Blase, MD  Brief History   Patient is 81 year old gentleman history of osteoarthritis, hypertension, right eye blepharitis, blindness, BPH, type 2 diabetes, hyperlipidemia, nephrolithiasis, hypogonadism, osteoporosis in the setting of androgen replacement therapy, gastric ulcer, near syncopal episode presented to the ED with persistent nausea, multiple episodes of emesis.  Patient denied any diarrhea constipation melena hematochezia.  Patient denied any urinary symptoms.  Patient noted in the ED to have some sinus pauses.  Patient also noted to have some positional lightheadedness but no chest pain palpitations or diaphoresis.  Patient seen in consultation by cardiology who recommended transfer to [facility] for evaluation by EP and possible PPM placement.  After further evaluation by electrophysiology, it was felt that patient's bradycardia/transient block was related to increased vagal tone during vomiting/retching and that he did not have an indication for permanent pacemaker placement.  Since his nausea and vomiting persisted and he was unable to tolerate anything by mouth, he was seen by GI who performed EGD (once patient had temporary transvenous pacemaker) which showed LA grade D esophagitis.  He also had esophageal stricture that was dilated.  Unfortunately the patient continues to have severe nausea and vomiting even with liquids and after dilatation of stricture. The patient reports that his heart rate goes down with these episodes.  The patient has been evaluated by EP cardiology who has signed off as he feels that the patient's bradycardia has resolved after dilatation of the stricture.  The patient states that he has continued to have nausea and vomiting. He states that he vomits up everything he eats.   Consultants  Gastroenterology EP cardiology  Procedures  EGD with   Antibiotics    Anti-infectives (From admission, onward)    Start     Dose/Rate Route Frequency Ordered Stop   05/15/21 0700  gentamicin (GARAMYCIN) 80 mg in sodium chloride 0.9 % 500 mL irrigation        80 mg Irrigation On call 05/15/21 0603 05/16/21 0700   05/15/21 0700  ceFAZolin (ANCEF) IVPB 2g/100 mL premix        2 g 200 mL/hr over 30 Minutes Intravenous On call 05/15/21 0603 05/16/21 0700        Subjective  The patient is resting comfortably. He continues to complain of vomiting.  Objective   Vitals:  Vitals:   05/22/21 0736 05/22/21 1520  BP: 125/77 (!) 150/61  Pulse: 72 66  Resp: 16   Temp: 97.8 F (36.6 C) 98.2 F (36.8 C)  SpO2: 99%     Exam:  Constitutional:  The patient is awake, alert, and oriented x 3. No acute distress. Respiratory:  No increased work of breathing. No wheezes, rales, or rhonchi No tactile fremitus Cardiovascular:  Regular rate and rhythm No murmurs, ectopy, or gallups. No lateral PMI. No thrills. Abdomen:  Abdomen is soft, non-tender, non-distended No hernias, masses, or organomegaly Normoactive bowel sounds.  Musculoskeletal:  No cyanosis, clubbing, or edema Skin:  No rashes, lesions, ulcers palpation of skin: no induration or nodules Neurologic:  CN 2-12 intact Sensation all 4 extremities intact Psychiatric:  Mental status Mood, affect appropriate Orientation to person, place, time  judgment and insight appear intact   I have personally reviewed the following:   Today's Data  Vitals  Lab Data  BMP CBC  Micro Data    Imaging    Cardiology Data  EKG  Other Data    Scheduled Meds:  apixaban  5 mg Oral BID   Chlorhexidine Gluconate Cloth  6 each Topical Daily   feeding supplement  237 mL Oral TID WC   feeding supplement  237 mL Oral TID BM   finasteride  5 mg Oral Daily   insulin aspart  0-9 Units Subcutaneous TID WC   mouth rinse  15 mL Mouth Rinse BID   multivitamin with minerals  1 tablet Oral Daily    pantoprazole  40 mg Oral BID   sodium chloride flush  3 mL Intravenous Q12H   sodium chloride flush  3 mL Intravenous Q12H   sucralfate  1 g Oral TID WC & HS   Continuous Infusions:  sodium chloride Stopped (05/20/21 0846)    Principal Problem:   Sinus pause Active Problems:   Benign essential hypertension   BPH (benign prostatic hyperplasia)   Type 2 diabetes mellitus (HCC)   Tobacco use disorder   Depression   Near syncope   Gastro-esophageal reflux disease without esophagitis   Stage 3a chronic kidney disease (CKD) (HCC)   Nausea and vomiting   Borderline prolonged QT interval   Abnormal head CT   Protein-calorie malnutrition, severe   LOS: 8 days   A & P  Abnormal head CT  Age indeterminate pontine CVA on Ct head. MRI suggested was performed and confirms remote small pontine infarct, but no acute pathology.  Benign essential hypertension Blood pressures controlled on as needed hydralazine.  Borderline prolonged QT interval Will recheck EKG.  BPH (benign prostatic hyperplasia) Continue proscar and flomax as at home.  Depression Noted. Patient is not taking Effexor as prescribed for home use.  Gastro-esophageal reflux disease without esophagitis Continue protonix.   Nausea and vomiting Pt continues to have nausea and vomiting, even after stricture was dilated. He states that his heart rate drops with emesis as well. Continue to monitor. EP cardiology has signed off. I will discuss with gastroenterology.  Near syncope Due to vomiting and vagal reflex. Patient with bradycardia with continued emesis. Monitor and avoid nodally acting reagents.  Protein-calorie malnutrition, severe Consult nutrition.  Sinus pause Vagally mediated.  Stage 3a chronic kidney disease (CKD) (HCC) Baseline creatinine 1.3 - 1.5. 1.39 today. Monitor creatinine, electrolytes, and volume status.  Tobacco use disorder Nicotine patch offered and 7-10 minutes of tobacco cessation  counseling given.  Type 2 diabetes mellitus (HCC) Glucoses followed with FSBS and SSI.   I have seen and examined this patient myself. I have spent 32 minutes in his evaluation and care.  DVT prophylaxis: Eliquis Code Status: Full Code Family Communication: None available Disposition Plan: Anticipate discharge to home.    Elanora Quin, DO [facility] Direct contact: see [email or web address]  7PM-7AM contact night coverage as above 05/22/2021, 6:57 AM  LOS: 8 days

## 2021-05-22 NOTE — Progress Notes (Signed)
PROGRESS NOTE  Scott Willis JKD:[id number] DOB: 08-Apr-1941 DOA: 05/13/2021 PCP: Scott Blase, MD  Brief History   Patient is 81 year old gentleman history of osteoarthritis, hypertension, right eye blepharitis, blindness, BPH, type 2 diabetes, hyperlipidemia, nephrolithiasis, hypogonadism, osteoporosis in the setting of androgen replacement therapy, gastric ulcer, near syncopal episode presented to the ED with persistent nausea, multiple episodes of emesis.  Patient denied any diarrhea constipation melena hematochezia.  Patient denied any urinary symptoms.  Patient noted in the ED to have some sinus pauses.  Patient also noted to have some positional lightheadedness but no chest pain palpitations or diaphoresis.  Patient seen in consultation by cardiology who recommended transfer to [facility] for evaluation by EP and possible PPM placement.  After further evaluation by electrophysiology, it was felt that patient's bradycardia/transient block was related to increased vagal tone during vomiting/retching and that he did not have an indication for permanent pacemaker placement.  Since his nausea and vomiting persisted and he was unable to tolerate anything by mouth, he was seen by GI who performed EGD (once patient had temporary transvenous pacemaker) which showed LA grade D esophagitis.  He also had esophageal stricture that was dilated.  Unfortunately the patient continues to have severe nausea and vomiting even with liquids and after dilatation of stricture. The patient reports that his heart rate goes down with these episodes.  The patient has been evaluated by EP cardiology who has signed off as he feels that the patient's bradycardia has resolved after dilatation of the stricture.  Consultants  Gastroenterology EP cardiology  Procedures  EGD with   Antibiotics   Anti-infectives (From admission, onward)    Start     Dose/Rate Route Frequency Ordered Stop   05/15/21 0700   gentamicin (GARAMYCIN) 80 mg in sodium chloride 0.9 % 500 mL irrigation        80 mg Irrigation On call 05/15/21 0603 05/16/21 0700   05/15/21 0700  ceFAZolin (ANCEF) IVPB 2g/100 mL premix        2 g 200 mL/hr over 30 Minutes Intravenous On call 05/15/21 0603 05/16/21 0700        Subjective  The patient is resting comfortably. No new complaints.   Objective   Vitals:  Vitals:   05/22/21 0057 05/22/21 0736  BP: (!) 104/45 125/77  Pulse: 62 72  Resp: 16 16  Temp: 98.5 F (36.9 C) 97.8 F (36.6 C)  SpO2: 95% 99%    Exam:  Constitutional:  The patient is awake, alert, and oriented x 3. No acute distress. Respiratory:  No increased work of breathing. No wheezes, rales, or rhonchi No tactile fremitus Cardiovascular:  Regular rate and rhythm No murmurs, ectopy, or gallups. No lateral PMI. No thrills. Abdomen:  Abdomen is soft, non-tender, non-distended No hernias, masses, or organomegaly Normoactive bowel sounds.  Musculoskeletal:  No cyanosis, clubbing, or edema Skin:  No rashes, lesions, ulcers palpation of skin: no induration or nodules Neurologic:  CN 2-12 intact Sensation all 4 extremities intact Psychiatric:  Mental status Mood, affect appropriate Orientation to person, place, time  judgment and insight appear intact   I have personally reviewed the following:   Corinne    Cardiology Data  EKG  Other Data    Scheduled Meds:  apixaban  5 mg Oral BID   Chlorhexidine Gluconate Cloth  6 each Topical Daily   feeding supplement  237 mL Oral TID  WC   feeding supplement  237 mL Oral TID BM   finasteride  5 mg Oral Daily   insulin aspart  0-9 Units Subcutaneous TID WC   mouth rinse  15 mL Mouth Rinse BID   multivitamin with minerals  1 tablet Oral Daily   pantoprazole (PROTONIX) IV  40 mg Intravenous Q12H   sodium chloride flush  3 mL Intravenous Q12H   sodium chloride flush  3 mL  Intravenous Q12H   sucralfate  1 g Oral TID WC & HS   Continuous Infusions:  sodium chloride Stopped (05/20/21 0846)    Principal Problem:   Sinus pause Active Problems:   Benign essential hypertension   BPH (benign prostatic hyperplasia)   Type 2 diabetes mellitus (HCC)   Tobacco use disorder   Depression   Near syncope   Gastro-esophageal reflux disease without esophagitis   Stage 3a chronic kidney disease (CKD) (HCC)   Nausea and vomiting   Borderline prolonged QT interval   Abnormal head CT   Protein-calorie malnutrition, severe   LOS: 8 days   A & P  Abnormal head CT  Age indeterminate pontine CVA on Ct head. MRI suggested was performed and confirms remote small pontine infarct, but no acute pathology.  Benign essential hypertension Blood pressures controlled on as needed hydralazine.  Borderline prolonged QT interval Will recheck EKG.  BPH (benign prostatic hyperplasia) Continue proscar and flomax as at home.  Depression Noted. Patient is not taking Effexor as prescribed for home use.  Gastro-esophageal reflux disease without esophagitis Continue protonix.   Nausea and vomiting Pt continues to have nausea and vomiting, even after stricture was dilated. He states that his heart rate drops with emesis as well. Continue to monitor. EP cardiology has signed off.  Near syncope Due to vomiting and vagal reflex. Patient with bradycardia with continued emesis. Monitor and avoid nodally acting reagents.  Protein-calorie malnutrition, severe Consult nutrition.  Sinus pause Vagally mediated.  Stage 3a chronic kidney disease (CKD) (HCC) Baseline creatinine 1.3 - 1.5. 1.39 today. Monitor creatinine, electrolytes, and volume status.  Tobacco use disorder Nicotine patch offered and 7-10 minutes of tobacco cessation counseling given.  Type 2 diabetes mellitus (HCC) Glucoses followed with FSBS and SSI.   I have seen and examined this patient myself. I have  spent 34 minutes in his evaluation and care.  DVT prophylaxis: Eliquis Code Status: Full Code Family Communication: None available Disposition Plan: Anticipate discharge to home.    Scott Halterman, DO [facility] Direct contact: see [email or web address]  7PM-7AM contact night coverage as above 05/22/2021, 8:53 AM  LOS: 8 days

## 2021-05-22 NOTE — Assessment & Plan Note (Signed)
Nutrition Status: Nutrition Problem: Severe Malnutrition Etiology: chronic illness (DM, TBI) Signs/Symptoms: severe fat depletion, severe muscle depletion, percent weight loss Interventions: Glucerna shake, MVI    

## 2021-05-22 NOTE — TOC Benefit Eligibility Note (Signed)
Patient Advocate Encounter ° °Insurance verification completed.   ° °The patient is currently admitted and upon discharge could be taking Eliquis 5 mg. ° °The current 30 day co-pay is, $45.00.  ° °The patient is currently admitted and upon discharge could be taking Xarelto 20 mg. ° °The current 30 day co-pay is, $45.00.  ° °The patient is insured through [employer] Medicare Part D  ° ° ° °Meda Dudzinski, CPhT °Pharmacy Patient Advocate Specialist °[facility] Patient Advocate Team °Direct Number: [phone]  Fax: [phone] ° ° ° ° ° °  °

## 2021-05-22 NOTE — Assessment & Plan Note (Addendum)
Vagally mediated. The patient has had 2 episodes of bradycardia into the 30's overnight. I have discussed this with Dr. Quentin Ore who states that this is not an indication for a pacemaker. The patient will be discharged to home. He should not be given nodally acting agents.

## 2021-05-22 NOTE — Assessment & Plan Note (Signed)
Noted. Patient is not taking Effexor as prescribed for home use.

## 2021-05-23 LAB — CBC
HCT: 34.1 % — ABNORMAL LOW (ref 39.0–52.0)
Hemoglobin: 11.2 g/dL — ABNORMAL LOW (ref 13.0–17.0)
MCH: 30.5 pg (ref 26.0–34.0)
MCHC: 32.8 g/dL (ref 30.0–36.0)
MCV: 92.9 fL (ref 80.0–100.0)
Platelets: 200 10*3/uL (ref 150–400)
RBC: 3.67 MIL/uL — ABNORMAL LOW (ref 4.22–5.81)
RDW: 13.2 % (ref 11.5–15.5)
WBC: 8.7 10*3/uL (ref 4.0–10.5)
nRBC: 0 % (ref 0.0–0.2)

## 2021-05-23 LAB — BASIC METABOLIC PANEL
Anion gap: 9 (ref 5–15)
BUN: 35 mg/dL — ABNORMAL HIGH (ref 8–23)
CO2: 27 mmol/L (ref 22–32)
Calcium: 9.5 mg/dL (ref 8.9–10.3)
Chloride: 101 mmol/L (ref 98–111)
Creatinine, Ser: 1.5 mg/dL — ABNORMAL HIGH (ref 0.61–1.24)
GFR, Estimated: 47 mL/min — ABNORMAL LOW (ref >60)
Glucose, Bld: 191 mg/dL — ABNORMAL HIGH (ref 70–99)
Potassium: 4.5 mmol/L (ref 3.5–5.1)
Sodium: 137 mmol/L (ref 135–145)

## 2021-05-23 LAB — GLUCOSE, CAPILLARY
Glucose-Capillary: 158 mg/dL — ABNORMAL HIGH (ref 70–99)
Glucose-Capillary: 182 mg/dL — ABNORMAL HIGH (ref 70–99)
Glucose-Capillary: 181 mg/dL — ABNORMAL HIGH (ref 70–99)
Glucose-Capillary: 140 mg/dL — ABNORMAL HIGH (ref 70–99)

## 2021-05-23 MED ORDER — METOCLOPRAMIDE HCL 5 MG PO TABS
10.0000 mg | ORAL_TABLET | Freq: Three times a day (TID) | ORAL | Status: DC
  Administered 2021-05-23 – 2021-05-24 (×4): 10 mg via ORAL
  Filled 2021-05-23 (×4): qty 2

## 2021-05-23 NOTE — Progress Notes (Signed)
PROGRESS NOTE  Scott Willis XHB:[id number] DOB: [birth year] DOA: 05/13/2021 PCP: Eunice Blase, MD  Brief History   Patient is 81 year old gentleman history of osteoarthritis, hypertension, right eye blepharitis, blindness, BPH, type 2 diabetes, hyperlipidemia, nephrolithiasis, hypogonadism, osteoporosis in the setting of androgen replacement therapy, gastric ulcer, near syncopal episode presented to the ED with persistent nausea, multiple episodes of emesis.  Patient denied any diarrhea constipation melena hematochezia.  Patient denied any urinary symptoms.  Patient noted in the ED to have some sinus pauses.  Patient also noted to have some positional lightheadedness but no chest pain palpitations or diaphoresis.  Patient seen in consultation by cardiology who recommended transfer to [facility] for evaluation by EP and possible PPM placement.  After further evaluation by electrophysiology, it was felt that patient's bradycardia/transient block was related to increased vagal tone during vomiting/retching and that he did not have an indication for permanent pacemaker placement.  Since his nausea and vomiting persisted and he was unable to tolerate anything by mouth, he was seen by GI who performed EGD (once patient had temporary transvenous pacemaker) which showed LA grade D esophagitis.  He also had esophageal stricture that was dilated.  Unfortunately the patient continues to have severe nausea and vomiting even with liquids and after dilatation of stricture. The patient reports that his heart rate goes down with these episodes.  The patient has been evaluated by EP cardiology who has signed off as he feels that the patient's bradycardia has resolved after dilatation of the stricture.  I have discussed the patient's continued issues with vomiting and not keeping even liquids down with GI. I have started the patient on Reglan 10 mg tid. They will re-eval.  The patient states that he has  been able to keep his breakfast down today  Consultants  Gastroenterology EP cardiology  Procedures  EGD  Antibiotics   Anti-infectives (From admission, onward)    Start     Dose/Rate Route Frequency Ordered Stop   05/15/21 0700  gentamicin (GARAMYCIN) 80 mg in sodium chloride 0.9 % 500 mL irrigation        80 mg Irrigation On call 05/15/21 0603 05/16/21 0700   05/15/21 0700  ceFAZolin (ANCEF) IVPB 2g/100 mL premix        2 g 200 mL/hr over 30 Minutes Intravenous On call 05/15/21 0603 05/16/21 0700        Subjective  The patient is resting comfortably. He continues to complain of vomiting.  Objective   Vitals:  Vitals:   05/23/21 0831 05/23/21 1131  BP: (!) 125/58 (!) 132/57  Pulse: 66 65  Resp:  18  Temp: 98 F (36.7 C) 98 F (36.7 C)  SpO2: 95% 96%    Exam:  Constitutional:  The patient is awake, alert, and oriented x 3. No acute distress. Respiratory:  No increased work of breathing. No wheezes, rales, or rhonchi No tactile fremitus Cardiovascular:  Regular rate and rhythm No murmurs, ectopy, or gallups. No lateral PMI. No thrills. Abdomen:  Abdomen is soft, non-tender, non-distended No hernias, masses, or organomegaly Normoactive bowel sounds.  Musculoskeletal:  No cyanosis, clubbing, or edema Skin:  No rashes, lesions, ulcers palpation of skin: no induration or nodules Neurologic:  CN 2-12 intact Sensation all 4 extremities intact Psychiatric:  Mental status Mood, affect appropriate Orientation to person, place, time  judgment and insight appear intact   I have personally reviewed the following:   Today's Data  Vitals  Lab Data  BMP CBC  Micro Data    Imaging    Cardiology Data  EKG  Other Data    Scheduled Meds:  apixaban  5 mg Oral BID   Chlorhexidine Gluconate Cloth  6 each Topical Daily   feeding supplement  237 mL Oral TID WC   feeding supplement  237 mL Oral TID BM   finasteride  5 mg Oral Daily   insulin  aspart  0-9 Units Subcutaneous TID WC   mouth rinse  15 mL Mouth Rinse BID   metoCLOPramide  10 mg Oral TID AC   multivitamin with minerals  1 tablet Oral Daily   pantoprazole  40 mg Oral BID   sodium chloride flush  3 mL Intravenous Q12H   sodium chloride flush  3 mL Intravenous Q12H   sucralfate  1 g Oral TID WC & HS   Continuous Infusions:  sodium chloride Stopped (05/20/21 0846)    Principal Problem:   Sinus pause Active Problems:   Benign essential hypertension   BPH (benign prostatic hyperplasia)   Type 2 diabetes mellitus (HCC)   Tobacco use disorder   Depression   Near syncope   Gastro-esophageal reflux disease without esophagitis   Stage 3a chronic kidney disease (CKD) (HCC)   Nausea and vomiting   Borderline prolonged QT interval   Abnormal head CT   Protein-calorie malnutrition, severe   LOS: 9 days   A & P  Abnormal head CT  Age indeterminate pontine CVA on Ct head. MRI suggested was performed and confirms remote small pontine infarct, but no acute pathology.  Benign essential hypertension Blood pressures controlled on as needed hydralazine.  Borderline prolonged QT interval Will recheck EKG.  BPH (benign prostatic hyperplasia) Continue proscar and flomax as at home.  Depression Noted. Patient is not taking Effexor as prescribed for home use.  Gastro-esophageal reflux disease without esophagitis Continue protonix.   Nausea and vomiting Pt continues to have nausea and vomiting, even after stricture was dilated. He states that his heart rate drops with emesis as well. Continue to monitor. EP cardiology has signed off. I have discussed the patient with GI. Will add Reglan 10 mg tid with meals.  The patient did say that he was able to keep down his breakfast today.  Near syncope Due to vomiting and vagal reflex. Patient with bradycardia with continued emesis. Monitor and avoid nodally acting reagents.  Protein-calorie malnutrition, severe Consult  nutrition.  Sinus pause Vagally mediated.  Stage 3a chronic kidney disease (CKD) (HCC) Baseline creatinine 1.3 - 1.5. 1.50 today. Monitor creatinine, electrolytes, and volume status.  Tobacco use disorder Nicotine patch offered and 7-10 minutes of tobacco cessation counseling given.  Type 2 diabetes mellitus (HCC) Glucoses followed with FSBS and SSI.   I have seen and examined this patient myself. I have spent 32 minutes in his evaluation and care.  DVT prophylaxis: Eliquis Code Status: Full Code Family Communication: None available Disposition Plan: Anticipate discharge to home.    Scott Mischke, DO [facility] Direct contact: see [email or web address]  7PM-7AM contact night coverage as above 05/23/2021, 6:03 PM  LOS: 8 days

## 2021-05-23 NOTE — Progress Notes (Addendum)
Pt verbalize at the beginning of this shift still felt nauseated since eating chicken on supper tray. Pt stated does not want any medications or liquids by mouth at this time. Pt given Compazine as ordered. Will continue to monitor pt for further issues.

## 2021-05-23 NOTE — Progress Notes (Addendum)
Subjective: Today is the first day that he did not vomit his breakfast.  Objective: Vital signs in last 24 hours: Temp:  [98 F (36.7 C)-98.2 F (36.8 C)] 98 F (36.7 C) (02/10 0831) Pulse Rate:  [61-66] 66 (02/10 0831) Resp:  [17-18] 18 (02/10 0429) BP: (125-152)/(54-85) 125/58 (02/10 0831) SpO2:  [93 %-97 %] 95 % (02/10 0831) Last BM Date: 05/22/21  Intake/Output from previous day: 02/09 0701 - 02/10 0700 In: -  Out: 925 [Urine:925] Intake/Output this shift: Total I/O In: -  Out: 250 [Urine:250]  General appearance: alert and no distress GI: soft, non-tender; bowel sounds normal; no masses,  no organomegaly  Lab Results: Recent Labs    05/22/21 0304 05/23/21 0243  WBC 8.9 8.7  HGB 11.2* 11.2*  HCT 34.0* 34.1*  PLT 184 200   BMET Recent Labs    05/22/21 0304 05/23/21 0243  NA 137 137  K 4.7 4.5  CL 100 101  CO2 28 27  GLUCOSE 178* 191*  BUN 29* 35*  CREATININE 1.39* 1.50*  CALCIUM 9.5 9.5   LFT No results for input(s): PROT, ALBUMIN, AST, ALT, ALKPHOS, BILITOT, BILIDIR, IBILI in the last 72 hours. PT/INR No results for input(s): LABPROT, INR in the last 72 hours. Hepatitis Panel No results for input(s): HEPBSAG, HCVAB, HEPAIGM, HEPBIGM in the last 72 hours. C-Diff No results for input(s): CDIFFTOX in the last 72 hours. Fecal Lactopherrin No results for input(s): FECLLACTOFRN in the last 72 hours.  Studies/Results: No results found.  Medications: Scheduled:  apixaban  5 mg Oral BID   Chlorhexidine Gluconate Cloth  6 each Topical Daily   feeding supplement  237 mL Oral TID WC   feeding supplement  237 mL Oral TID BM   finasteride  5 mg Oral Daily   insulin aspart  0-9 Units Subcutaneous TID WC   mouth rinse  15 mL Mouth Rinse BID   metoCLOPramide  10 mg Oral TID AC   multivitamin with minerals  1 tablet Oral Daily   pantoprazole  40 mg Oral BID   sodium chloride flush  3 mL Intravenous Q12H   sodium chloride flush  3 mL Intravenous Q12H    sucralfate  1 g Oral TID WC & HS   Continuous:  sodium chloride Stopped (05/20/21 0846)    Assessment/Plan: 1) LA Grade D esophagitis. 2) Nausea and vomiting - ? Improving.   Today he states that he was able to tolerate his breakfast.  It appears that he ate breakfast before being administered Reglan.  Hopefully he is turn the corner, however, it may be an issue of him eating too much at one time.  Plan: 1) Okay with Reglan for now.  Monitor for any side effects, neurologic and/or diarrhea. 2) He was instructed to eat small frequent meals. 3) Dr. Elwyn Reach, [facility], will cover the patient this weekend.  LOS: 9 days   Yaslin Kirtley D 05/23/2021, 1:44 PM

## 2021-05-23 NOTE — Progress Notes (Signed)
Physical Therapy Treatment/ Discharge °Patient Details °Name: Scott Willis °MRN: [id number] °DOB: 04/12/1941 °Today's Date: 05/23/2021 ° ° °History of Present Illness 80 y.o. male presenting to [place] ED 1/31 with intractableN/V x1 day. MRI brain shows no acute abnormalities, small remote L pons infarct.. Patient admitted with symtomatic bradycardia with pauses as long as 7 seconds. Initially planned for PPM but per GI, pt with possible "swallow syncope" which is likely cause of pauses. Cardiology does not plan for PPM at this time with further GI work-up pending. PMHx signficant for OA, HTN, L eye blindness, lumbago, BPH, DMII, HLD, nephrolithiasis, and osteoporosis. ° °  °PT Comments  ° ° Pt very pleasant and eager to move. Pt able to move about in room, walk long hall distance and simulate home environment. Pt simulating reaching for rolls of printing paper, donning shoes/socks edge of elevated table and moving about with his regular routine without LOB and HR 65-80. Pt walks to [employer] for tv dinners and keeps them in the fridge then microwaves them. Pt currently able to function at his baseline status today without physical assist or LOB. No further acute needs with pt encouraged to walk in hall with a mask. Will sign off with pt aware and agreeable.  ° °  °Recommendations for follow up therapy are one component of a multi-disciplinary discharge planning process, led by the attending physician.  Recommendations may be updated based on patient status, additional functional criteria and insurance authorization. ° °Follow Up Recommendations ° No PT follow up °  °  °Assistance Recommended at Discharge None  °Patient can return home with the following Assist for transportation °  °Equipment Recommendations ° None recommended by PT  °  °Recommendations for Other Services   ° ° °  °Precautions / Restrictions Precautions °Precautions: Other (comment) °Precaution Comments: monitor HR, legally blind  °   ° °Mobility ° Bed Mobility °Overal bed mobility: Modified Independent °  °  °  °  °  °  °General bed mobility comments: bed elevated to simulate home environment of sleeping on a pad on the printing table °  ° °Transfers °Overall transfer level: Independent °  °Transfers: Sit to/from Stand °  °  °  °  °  °  °General transfer comment: no assist to rise from chair or elevated bed °  ° °Ambulation/Gait °Ambulation/Gait assistance: Independent °Gait Distance (Feet): 500 Feet °Assistive device: None °Gait Pattern/deviations: Step-through pattern, Decreased stride length °  °Gait velocity interpretation: >2.62 ft/sec, indicative of community ambulatory °  °General Gait Details: pt with good gait speed and able to speed up at times to simulate home routine but cannot maintain, no LOB with changes in speed or direction with minor cues for environment due to decreased vision ° ° °Stairs °  °  °  °  °  ° ° °Wheelchair Mobility °  ° °Modified Rankin (Stroke Patients Only) °  ° ° °  °Balance Overall balance assessment: No apparent balance deficits (not formally assessed) °  °  °  °  °  °  °  °  °  °  °  °  °  °  °  °  °  °  °  ° °  °Cognition Arousal/Alertness: Awake/alert °Behavior During Therapy: WFL for tasks assessed/performed °Overall Cognitive Status: Within Functional Limits for tasks assessed °  °  °  °  °  °  °  °  °  °  °  °  °  °  °  °  °  °  °  ° °  °  Exercises   ° °  °General Comments   °  °  ° °Pertinent Vitals/Pain Pain Assessment °Pain Assessment: No/denies pain  ° ° °Home Living   °  °  °  °  °  °  °  °  °  °   °  °Prior Function    °  °  °   ° °PT Goals (current goals can now be found in the care plan section) Progress towards PT goals: Goals met/education completed, patient discharged from PT ° °  °Frequency ° ° °   ° ° ° °  °PT Plan Current plan remains appropriate  ° ° °Co-evaluation   °  °  °  °  ° °  °AM-PAC PT "6 Clicks" Mobility   °Outcome Measure ° Help needed turning from your back to your side while  in a flat bed without using bedrails?: None °Help needed moving from lying on your back to sitting on the side of a flat bed without using bedrails?: None °Help needed moving to and from a bed to a chair (including a wheelchair)?: None °Help needed standing up from a chair using your arms (e.g., wheelchair or bedside chair)?: None °Help needed to walk in hospital room?: None °Help needed climbing 3-5 steps with a railing? : A Little °6 Click Score: 23 ° °  °End of Session   °Activity Tolerance: Patient tolerated treatment well °Patient left: in chair;with call bell/phone within reach °Nurse Communication: Mobility status °PT Visit Diagnosis: Other abnormalities of gait and mobility (R26.89) °  ° ° °Time: 0947-1013 °PT Time Calculation (min) (ACUTE ONLY): 26 min ° °Charges:  $Gait Training: 8-22 mins °$Therapeutic Activity: 8-22 mins          °          ° °Maija P, PT °Acute Rehabilitation Services °Pager: [phone] °Office: [phone] ° ° ° °Maija B Prater °05/23/2021, 11:09 AM ° °

## 2021-05-23 NOTE — Plan of Care (Signed)
°  Problem: Education: Goal: Knowledge of General Education information will improve Description: Including pain rating scale, medication(s)/side effects and non-pharmacologic comfort measures Outcome: Progressing   Problem: Clinical Measurements: Goal: Will remain free from infection Outcome: Progressing Goal: Cardiovascular complication will be avoided Outcome: Progressing   Problem: Activity: Goal: Risk for activity intolerance will decrease Outcome: Progressing   Problem: Nutrition: Goal: Adequate nutrition will be maintained Outcome: Progressing   Problem: Coping: Goal: Level of anxiety will decrease Outcome: Progressing   Problem: Elimination: Goal: Will not experience complications related to bowel motility Outcome: Progressing Goal: Will not experience complications related to urinary retention Outcome: Progressing   Problem: Pain Managment: Goal: General experience of comfort will improve Outcome: Progressing   Problem: Safety: Goal: Ability to remain free from injury will improve Outcome: Progressing   Problem: Skin Integrity: Goal: Risk for impaired skin integrity will decrease Outcome: Progressing

## 2021-05-23 NOTE — Plan of Care (Signed)
°  Problem: Clinical Measurements: Goal: Will remain free from infection Outcome: Progressing Goal: Cardiovascular complication will be avoided Outcome: Progressing   Problem: Activity: Goal: Risk for activity intolerance will decrease Outcome: Progressing   Problem: Coping: Goal: Level of anxiety will decrease Outcome: Progressing   Problem: Elimination: Goal: Will not experience complications related to bowel motility Outcome: Progressing   Problem: Pain Managment: Goal: General experience of comfort will improve Outcome: Progressing   Problem: Safety: Goal: Ability to remain free from injury will improve Outcome: Progressing   Problem: Skin Integrity: Goal: Risk for impaired skin integrity will decrease Outcome: Progressing

## 2021-05-24 LAB — CBC
HCT: 33 % — ABNORMAL LOW (ref 39.0–52.0)
Hemoglobin: 10.5 g/dL — ABNORMAL LOW (ref 13.0–17.0)
MCH: 30.3 pg (ref 26.0–34.0)
MCHC: 31.8 g/dL (ref 30.0–36.0)
MCV: 95.4 fL (ref 80.0–100.0)
Platelets: 203 10*3/uL (ref 150–400)
RBC: 3.46 MIL/uL — ABNORMAL LOW (ref 4.22–5.81)
RDW: 13.2 % (ref 11.5–15.5)
WBC: 10.6 10*3/uL — ABNORMAL HIGH (ref 4.0–10.5)
nRBC: 0 % (ref 0.0–0.2)

## 2021-05-24 LAB — BASIC METABOLIC PANEL
Anion gap: 9 (ref 5–15)
BUN: 57 mg/dL — ABNORMAL HIGH (ref 8–23)
CO2: 25 mmol/L (ref 22–32)
Calcium: 9.2 mg/dL (ref 8.9–10.3)
Chloride: 103 mmol/L (ref 98–111)
Creatinine, Ser: 1.59 mg/dL — ABNORMAL HIGH (ref 0.61–1.24)
GFR, Estimated: 44 mL/min — ABNORMAL LOW (ref >60)
Glucose, Bld: 213 mg/dL — ABNORMAL HIGH (ref 70–99)
Potassium: 4.8 mmol/L (ref 3.5–5.1)
Sodium: 137 mmol/L (ref 135–145)

## 2021-05-24 LAB — GLUCOSE, CAPILLARY
Glucose-Capillary: 217 mg/dL — ABNORMAL HIGH (ref 70–99)
Glucose-Capillary: 153 mg/dL — ABNORMAL HIGH (ref 70–99)

## 2021-05-24 MED ORDER — APIXABAN 5 MG PO TABS: 5.0000 mg | ORAL_TABLET | Freq: Two times a day (BID) | ORAL | 0 refills | Status: AC

## 2021-05-24 MED ORDER — POLYVINYL ALCOHOL 1.4 % OP SOLN: 1.0000 [drp] | Freq: Three times a day (TID) | OPHTHALMIC | 0 refills | Status: DC | PRN

## 2021-05-24 MED ORDER — METOCLOPRAMIDE HCL 10 MG PO TABS: 10.0000 mg | ORAL_TABLET | Freq: Three times a day (TID) | ORAL | 0 refills | Status: AC

## 2021-05-24 MED ORDER — PANTOPRAZOLE SODIUM 40 MG PO TBEC: 40.0000 mg | DELAYED_RELEASE_TABLET | Freq: Two times a day (BID) | ORAL | 0 refills | Status: AC

## 2021-05-24 MED ORDER — METOCLOPRAMIDE HCL 10 MG PO TABS: 10.0000 mg | ORAL_TABLET | Freq: Three times a day (TID) | ORAL | 0 refills | Status: DC

## 2021-05-24 MED ORDER — ENSURE ENLIVE PO LIQD: 237.0000 mL | Freq: Three times a day (TID) | ORAL | 12 refills | Status: DC

## 2021-05-24 MED ORDER — POLYVINYL ALCOHOL 1.4 % OP SOLN: 1.0000 [drp] | Freq: Three times a day (TID) | OPHTHALMIC | 0 refills | Status: AC | PRN

## 2021-05-24 MED ORDER — ENSURE ENLIVE PO LIQD: 237.0000 mL | Freq: Three times a day (TID) | ORAL | 12 refills | Status: AC

## 2021-05-24 MED ORDER — PANTOPRAZOLE SODIUM 40 MG PO TBEC: 40.0000 mg | DELAYED_RELEASE_TABLET | Freq: Two times a day (BID) | ORAL | 0 refills | Status: DC

## 2021-05-24 MED ORDER — DOCUSATE SODIUM 100 MG PO CAPS
200.0000 mg | ORAL_CAPSULE | Freq: Once | ORAL | Status: AC
  Administered 2021-05-24: 200 mg via ORAL
  Filled 2021-05-24: qty 2

## 2021-05-24 MED ORDER — APIXABAN 5 MG PO TABS: 5.0000 mg | ORAL_TABLET | Freq: Two times a day (BID) | ORAL | 0 refills | Status: DC

## 2021-05-24 MED ORDER — SUCRALFATE 1 GM/10ML PO SUSP: 1.0000 g | Freq: Three times a day (TID) | ORAL | 0 refills | Status: DC

## 2021-05-24 NOTE — Discharge Summary (Signed)
Physician Discharge Summary   Patient: TAD FANCHER MRN: [id number] DOB: 01-30-1941  Admit date:     05/13/2021  Discharge date: 05/24/21  Discharge Physician: Karie Kirks   PCP: Eunice Blase, MD   Recommendations at discharge:    Discharge to home Follow up with PCP in 7-10 days. Chemistry should be drawn on that visit and reported to PCP. Follow up with Dr. Benson Norway in 4-6 weeks.  Discharge Diagnoses: Principal Problem:   Sinus pause Active Problems:   Benign essential hypertension   BPH (benign prostatic hyperplasia)   Type 2 diabetes mellitus (HCC)   Tobacco use disorder   Depression   Near syncope   Gastro-esophageal reflux disease without esophagitis   Stage 3a chronic kidney disease (CKD) (HCC)   Nausea and vomiting   Borderline prolonged QT interval   Abnormal head CT   Protein-calorie malnutrition, severe  Resolved Problems:   * No resolved hospital problems. *   Hospital Course: Patient is 81 year old gentleman history of osteoarthritis, hypertension, right eye blepharitis, blindness, BPH, type 2 diabetes, hyperlipidemia, nephrolithiasis, hypogonadism, osteoporosis in the setting of androgen replacement therapy, gastric ulcer, near syncopal episode presented to the ED with persistent nausea, multiple episodes of emesis.  Patient denied any diarrhea constipation melena hematochezia.  Patient denied any urinary symptoms.  Patient noted in the ED to have some sinus pauses.  Patient also noted to have some positional lightheadedness but no chest pain palpitations or diaphoresis.  Patient seen in consultation by cardiology who recommended transfer to [facility] for evaluation by EP and possible PPM placement.  After further evaluation by electrophysiology, it was felt that patient's bradycardia/transient block was related to increased vagal tone during vomiting/retching and that he did not have an indication for permanent pacemaker placement.  Since his nausea and  vomiting persisted and he was unable to tolerate anything by mouth, he was seen by GI who performed EGD (once patient had temporary transvenous pacemaker) which showed LA grade D esophagitis.  He also had esophageal stricture that was dilated.   Unfortunately the patient continues to have severe nausea and vomiting even with liquids and after dilatation of stricture. The patient reports that his heart rate goes down with these episodes.   The patient has been evaluated by EP cardiology who has signed off as he feels that the patient's bradycardia has resolved after dilatation of the stricture.   I have discussed the patient's continued issues with vomiting and not keeping even liquids down with GI. I have started the patient on Reglan 10 mg tid. They have  re-evaluated the patient. They feel that he will continue to improve over time.  On the morning of 05/24/2021 nursing has advised me that the patient has twice had instances of bradycardia into the 30's with Mobitz type II on telemetry. It seems that the patient was sleeping on both occurrences. I have discussed the patient with Dr.Lambert who knows the patient. He states that these instances are not sustained, and are not an indication for pacemaker.   The patient states that he has been able to keep his meals down for the past 24 hours. He will be discharged to home in fair condition.  Assessment and Plan: * Sinus pause- (present on admission) Vagally mediated. The patient has had 2 episodes of bradycardia into the 30's overnight. I have discussed this with Dr. Quentin Ore who states that this is not an indication for a pacemaker. The patient will be discharged to home. He should  not be given nodally acting agents.  Protein-calorie malnutrition, severe- (present on admission) Consult nutrition.  Abnormal head CT- (present on admission)  Age indeterminate pontine CVA on Ct head. MRI suggested was performed and confirms remote small pontine  infarct, but no acute pathology.  Borderline prolonged QT interval- (present on admission) Will recheck EKG.  Nausea and vomiting- (present on admission) Pt continues to have nausea and vomiting, even after stricture was dilated. He states that his heart rate drops with emesis as well. Continue to monitor. EP cardiology has signed off. I have discussed the patient with GI. Will add Reglan 10 mg tid with meals.  The patient has kept down his meals for the last 24 hours. He will be discharged to home.  Stage 3a chronic kidney disease (CKD) ([place])- (present on admission) Baseline creatinine 1.3 - 1.5. 1.59 today. Monitor creatinine, electrolytes, and volume status.  Gastro-esophageal reflux disease without esophagitis- (present on admission) Continue protonix.   Near syncope- (present on admission) Due to vomiting and vagal reflex. Patient with bradycardia with continued emesis. Monitor and avoid nodally acting reagents.  The patient has had no problem with his meals and keeping them down for the past 24 hours.   Depression- (present on admission) Noted. Patient is not taking Effexor as prescribed for home use.  Tobacco use disorder- (present on admission) Nicotine patch offered and 7-10 minutes of tobacco cessation counseling given.  Type 2 diabetes mellitus (HCC) Glucoses followed with FSBS and SSI.  BPH (benign prostatic hyperplasia)- (present on admission) Continue proscar and flomax as at home.  Benign essential hypertension- (present on admission) Blood pressures controlled on as needed hydralazine.   Consultants: Cardiology   Gastroenterology Procedures performed: EGD  Disposition: Home Diet recommendation:  Discharge Diet Orders (From admission, onward)     Start     Ordered   05/24/21 0000  Diet - low sodium heart healthy        05/24/21 1502           Cardiac and Carb modified diet  DISCHARGE MEDICATION: Allergies as of 05/24/2021       Reactions   Bee  Pollen Anaphylaxis   Morphine And Related Other (See Comments)   "Kidney failure"   Tape Rash, Other (See Comments)   Any medical tape = rashes   Metformin Diarrhea, Other (See Comments)   Severe Diarrhea    Metformin And Related Diarrhea, Other (See Comments)   Severe Diarrhea    Morphine Other (See Comments)   "Shut down my kidneys"   Statins Other (See Comments)   Loss of appetite   Fluorescein-benoxinate Rash   Iodinated Contrast Media Rash        Medication List     STOP taking these medications    diazepam 5 MG tablet Commonly known as: VALIUM   Golytely 236 g solution Generic drug: polyethylene glycol   Januvia 100 MG tablet Generic drug: sitaGLIPtin   oxyCODONE-acetaminophen 5-325 MG tablet Commonly known as: PERCOCET/ROXICET       TAKE these medications    apixaban 5 MG Tabs tablet Commonly known as: ELIQUIS Take 1 tablet (5 mg total) by mouth 2 (two) times daily.   blood glucose meter kit and supplies Kit Check CBG QID   diclofenac sodium 1 % Gel Commonly known as: VOLTAREN Apply 4 g topically 4 (four) times daily as needed. What changed: reasons to take this   feeding supplement Liqd Take 237 mLs by mouth 3 (three) times daily with meals.  finasteride 5 MG tablet Commonly known as: PROSCAR TAKE 1 TABLET BY MOUTH EVERY DAY   magnesium oxide 400 (240 Mg) MG tablet Commonly known as: MAG-OX TAKE 1 TABLET BY MOUTH EVERY DAY   metoCLOPramide 10 MG tablet Commonly known as: REGLAN Take 1 tablet (10 mg total) by mouth 3 (three) times daily before meals.   mupirocin ointment 2 % Commonly known as: BACTROBAN Apply 1 application topically 2 (two) times daily. What changed:  when to take this additional instructions   pantoprazole 40 MG tablet Commonly known as: PROTONIX Take 1 tablet (40 mg total) by mouth 2 (two) times daily.   polyvinyl alcohol 1.4 % ophthalmic solution Commonly known as: LIQUIFILM TEARS Place 1 drop into both eyes  3 (three) times daily as needed for dry eyes (irritation).   PRESERVISION AREDS 2 PO Take 1 capsule by mouth daily.   Prodigy Twist Top Lancets 28G Misc   sucralfate 1 GM/10ML suspension Commonly known as: CARAFATE Take 10 mLs (1 g total) by mouth 4 (four) times daily -  with meals and at bedtime.   Systane Complete 0.6 % Soln Generic drug: Propylene Glycol Place 1 drop into both eyes 3 (three) times daily as needed (for irritation).   tamsulosin 0.4 MG Caps capsule Commonly known as: FLOMAX TAKE 1 CAPSULE BY MOUTH EVERY DAY   trimethoprim-polymyxin b ophthalmic solution Commonly known as: POLYTRIM Place 1 drop into the right eye See admin instructions. Instill 1 drop into the right eye four times a day for 2 days after eye injection every 10 weeks   venlafaxine 75 MG tablet Commonly known as: EFFEXOR Take 1 tablet (75 mg total) by mouth 2 (two) times daily.   Vitamin D-3 125 MCG (5000 UT) Tabs Take 5,000 Units by mouth daily.        Follow-up Information     Baldwin Jamaica, PA-C Follow up.   Specialty: Cardiology Why: 06/06/21 @ 10:55AM, for Dr. Curt Bears, follow up on your heart rhythm Contact information: [place] [address] [phone]                 Discharge Exam: Danley Danker Weights   05/13/21 0517  Weight: 70.3 kg   Vitals:   05/24/21 0738 05/24/21 1158  BP: (!) 134/54 139/68  Pulse: 66 65  Resp: 18 16  Temp: 98.2 F (36.8 C) (!) 97.5 F (36.4 C)  SpO2: 96% 98%   Exam:  Constitutional:  The patient is awake, alert, and oriented x 3. No acute distress.  Respiratory:  No increased work of breathing. No wheezes, rales, or rhonchi No tactile fremitus Cardiovascular:  Regular rate and rhythm No murmurs, ectopy, or gallups. No lateral PMI. No thrills. Abdomen:  Abdomen is soft, non-tender, non-distended No hernias, masses, or organomegaly Normoactive bowel sounds.  Musculoskeletal:  No cyanosis, clubbing, or  edema Skin:  No rashes, lesions, ulcers palpation of skin: no induration or nodules Neurologic:  CN 2-12 intact Sensation all 4 extremities intact Psychiatric:  Mental status Mood, affect appropriate Orientation to person, place, time  judgment and insight appear intact   Condition at discharge: fair  The results of significant diagnostics from this hospitalization (including imaging, microbiology, ancillary and laboratory) are listed below for reference.   Imaging Studies: CT HEAD WO CONTRAST  Result Date: 05/13/2021 CLINICAL DATA:  Dizziness, non-specific EXAM: CT HEAD WITHOUT CONTRAST TECHNIQUE: Contiguous axial images were obtained from the base of the skull through the vertex without intravenous  contrast. RADIATION DOSE REDUCTION: This exam was performed according to the departmental dose-optimization program which includes automated exposure control, adjustment of the mA and/or kV according to patient size and/or use of iterative reconstruction technique. COMPARISON:  CT head 04/23/2015. FINDINGS: Brain: New hypodensity in the left aspect of pons (for example: Series 5, image 26; series 2 image 9; series 4, images 37/38.). No evidence of acute hemorrhage, hydrocephalus, mass lesion, midline shift or extra-axial hemorrhage. Cerebral atrophy. Patchy white matter hypoattenuation, nonspecific but compatible with chronic microvascular disease. Vascular: No hyperdense vessel identified. Calcific intracranial atherosclerosis. Skull: No acute fracture. Sinuses/Orbits: Clear sinuses.  Unremarkable orbits. Other: No mastoid effusions. IMPRESSION: Hypodensity in the left aspect of the pons, suspicious for infarct that is new since 2017 but otherwise age indeterminate by CT. MRI could further evaluate for acute infarct. Electronically Signed   By: Margaretha Sheffield M.D.   On: 05/13/2021 05:19   MR BRAIN WO CONTRAST  Result Date: 05/13/2021 CLINICAL DATA:  Nausea, multiple episodes of emesis,  possible stroke EXAM: MRI HEAD WITHOUT CONTRAST TECHNIQUE: Multiplanar, multiecho pulse sequences of the brain and surrounding structures were obtained without intravenous contrast. COMPARISON:  CT head obtained earlier the same day FINDINGS: Brain: There is no evidence of acute intracranial hemorrhage, extra-axial fluid collection, or acute infarct. There is a small remote infarct in the left pons corresponding to the finding on the same-day head CT. There is a background of mild global parenchymal volume loss with prominence of the ventricular system and extra-axial CSF spaces. Patchy FLAIR signal abnormality throughout the subcortical and periventricular white matter is nonspecific but likely reflects sequela of moderate chronic white matter microangiopathy. There is no mass lesion.  There is no midline shift. Vascular: Normal flow voids. Skull and upper cervical spine: Normal marrow signal. Sinuses/Orbits: The paranasal sinuses are clear. Bilateral lens implants are in place. The globes and orbits are otherwise unremarkable. Other: None. IMPRESSION: 1. No acute intracranial pathology. 2. Small remote infarct in the left pons corresponding to the finding on the CT. 3. Moderate chronic white matter microangiopathy. Electronically Signed   By: Valetta Mole M.D.   On: 05/13/2021 12:22   CARDIAC CATHETERIZATION  Result Date: 05/19/2021 Successful placement of temporary transvenous pacemaker.  Set to back up rate 50 bpm with patient natively conducting in the 60s.  Can increase if desired at time of endoscopy.   DG CHEST PORT 1 VIEW  Result Date: 05/13/2021 CLINICAL DATA:  81 year old male with vomiting. EXAM: PORTABLE CHEST 1 VIEW COMPARISON:  Chest radiographs 04/23/2015 and earlier. FINDINGS: Portable AP semi upright view at 0433 hours. Stable lung volumes. Mediastinal contours remain normal. Visualized tracheal air column is within normal limits. No pneumothorax or pulmonary edema. Lung base ventilation  appears stable since 2017, no consolidation or confluent opacity. No acute osseous abnormality identified. Paucity of bowel gas in the upper abdomen. IMPRESSION: No acute cardiopulmonary abnormality. Electronically Signed   By: Genevie Ann M.D.   On: 05/13/2021 04:44   ECHOCARDIOGRAM COMPLETE  Result Date: 05/13/2021    ECHOCARDIOGRAM REPORT   Patient Name:   OAKLAND FANT Date of Exam: 05/13/2021 Medical Rec #:  [id number]          Height:       70.5 in Accession #:    [id number]         Weight:       155.0 lb Date of Birth:  [birth year]           BSA:  1.883 m Patient Age:    12 years           BP:           139/65 mmHg Patient Gender: M                  HR:           87 bpm. Exam Location:  Inpatient Procedure: 2D Echo Indications:    Stroke  History:        Patient has prior history of Echocardiogram examinations, most                 recent 06/24/2015. Risk Factors:Hypertension, Diabetes and                 Dyslipidemia.  Sonographer:    Arlyss Gandy Referring Phys: [id number] DAVID MANUEL [place]  1. Left ventricular ejection fraction, by estimation, is 60 to 65%. The left ventricle has normal function. The left ventricle has no regional wall motion abnormalities. Left ventricular diastolic parameters are consistent with Grade I diastolic dysfunction (impaired relaxation).  2. Right ventricular systolic function is normal. The right ventricular size is normal. There is normal pulmonary artery systolic pressure.  3. The mitral valve is normal in structure. Mild mitral valve regurgitation. No evidence of mitral stenosis.  4. The aortic valve is normal in structure. Aortic valve regurgitation is trivial. No aortic stenosis is present.  5. The inferior vena cava is normal in size with greater than 50% respiratory variability, suggesting right atrial pressure of 3 mmHg. Conclusion(s)/Recommendation(s): No intracardiac source of embolism detected on this transthoracic study. Consider a  transesophageal echocardiogram to exclude cardiac source of embolism if clinically indicated. FINDINGS  Left Ventricle: Left ventricular ejection fraction, by estimation, is 60 to 65%. The left ventricle has normal function. The left ventricle has no regional wall motion abnormalities. The left ventricular internal cavity size was normal in size. There is  no left ventricular hypertrophy. Left ventricular diastolic parameters are consistent with Grade I diastolic dysfunction (impaired relaxation). Right Ventricle: The right ventricular size is normal. No increase in right ventricular wall thickness. Right ventricular systolic function is normal. There is normal pulmonary artery systolic pressure. The tricuspid regurgitant velocity is 2.28 m/s, and  with an assumed right atrial pressure of 3 mmHg, the estimated right ventricular systolic pressure is 57.0 mmHg. Left Atrium: Left atrial size was normal in size. Right Atrium: Right atrial size was normal in size. Pericardium: There is no evidence of pericardial effusion. Mitral Valve: The mitral valve is normal in structure. Mild mitral valve regurgitation. No evidence of mitral valve stenosis. Tricuspid Valve: The tricuspid valve is normal in structure. Tricuspid valve regurgitation is not demonstrated. No evidence of tricuspid stenosis. Aortic Valve: The aortic valve is normal in structure. Aortic valve regurgitation is trivial. No aortic stenosis is present. Aortic valve mean gradient measures 4.0 mmHg. Aortic valve peak gradient measures 7.8 mmHg. Aortic valve area, by VTI measures 1.90 cm. Pulmonic Valve: The pulmonic valve was normal in structure. Pulmonic valve regurgitation is not visualized. No evidence of pulmonic stenosis. Aorta: The aortic root is normal in size and structure. Venous: The inferior vena cava is normal in size with greater than 50% respiratory variability, suggesting right atrial pressure of 3 mmHg. IAS/Shunts: No atrial level shunt detected  by color flow Doppler.  LEFT VENTRICLE PLAX 2D LVIDd:         4.30 cm   Diastology LVIDs:  3.00 cm   LV e' medial:    14.10 cm/s LV PW:         1.10 cm   LV E/e' medial:  5.0 LV IVS:        1.10 cm   LV e' lateral:   7.07 cm/s LVOT diam:     2.00 cm   LV E/e' lateral: 10.1 LV SV:         62 LV SV Index:   33 LVOT Area:     3.14 cm  RIGHT VENTRICLE RV Basal diam:  3.10 cm RV Mid diam:    2.70 cm RV S prime:     14.60 cm/s TAPSE (M-mode): 1.9 cm LEFT ATRIUM             Index        RIGHT ATRIUM           Index LA diam:        3.80 cm 2.02 cm/m   RA Area:     12.80 cm LA Vol (A2C):   26.9 ml 14.29 ml/m  RA Volume:   25.70 ml  13.65 ml/m LA Vol (A4C):   49.2 ml 26.13 ml/m LA Biplane Vol: 37.2 ml 19.76 ml/m  AORTIC VALVE AV Area (Vmax):    1.94 cm AV Area (Vmean):   2.03 cm AV Area (VTI):     1.90 cm AV Vmax:           140.00 cm/s AV Vmean:          89.400 cm/s AV VTI:            0.326 m AV Peak Grad:      7.8 mmHg AV Mean Grad:      4.0 mmHg LVOT Vmax:         86.50 cm/s LVOT Vmean:        57.900 cm/s LVOT VTI:          0.197 m LVOT/AV VTI ratio: 0.60  AORTA Ao Root diam: 3.10 cm Ao Asc diam:  3.10 cm MITRAL VALVE               TRICUSPID VALVE MV Area (PHT): 2.39 cm    TR Peak grad:   20.8 mmHg MV Decel Time: 317 msec    TR Vmax:        228.00 cm/s MV E velocity: 71.10 cm/s MV A velocity: 84.00 cm/s  SHUNTS MV E/A ratio:  0.85        Systemic VTI:  0.20 m                            Systemic Diam: 2.00 cm Candee Furbish MD Electronically signed by Candee Furbish MD Signature Date/Time: 05/13/2021/12:33:49 PM    Final    VAS US CAROTID (at [facility] and [facility] only)  Result Date: 05/13/2021 Carotid Arterial Duplex Study Patient Name:  MOMODOU CONSIGLIO [facility]  Date of Exam:   05/13/2021 Medical Rec #: [id number]           Accession #:    [id number] Date of Birth: 1940/05/17            Patient Gender: M Patient Age:   73 years Exam Location:  [facility] Procedure:      VAS US CAROTID Referring Phys: Shanon Brow ORTIZ  --------------------------------------------------------------------------------  Indications:       CVA. Risk Factors:      Hypertension, Diabetes. Limitations  Today's exam was limited due to the patient's respiratory                    variation and Intractable nausea and vomiting. Comparison Study:  No prior studies. Performing Technologist: Oliver Hum RVT  Examination Guidelines: A complete evaluation includes B-mode imaging, spectral Doppler, color Doppler, and power Doppler as needed of all accessible portions of each vessel. Bilateral testing is considered an integral part of a complete examination. Limited examinations for reoccurring indications may be performed as noted.  Right Carotid Findings: +----------+--------+-------+--------+--------------------------------+--------+             PSV cm/s EDV     Stenosis Plaque Description               Comments                       cm/s                                                        +----------+--------+-------+--------+--------------------------------+--------+  CCA Prox   115      10               smooth and heterogenous                    +----------+--------+-------+--------+--------------------------------+--------+  CCA Distal 99       14               smooth and heterogenous                    +----------+--------+-------+--------+--------------------------------+--------+  ICA Prox   109      15               calcific, smooth and                                                             heterogenous                               +----------+--------+-------+--------+--------------------------------+--------+  ICA Distal 65       14                                                          +----------+--------+-------+--------+--------------------------------+--------+  ECA        106      15                                                          +----------+--------+-------+--------+--------------------------------+--------+  +----------+--------+-------+--------+-------------------+             PSV cm/s EDV cms Describe Arm Pressure (mmHG)  +----------+--------+-------+--------+-------------------+  Subclavian 53                                             +----------+--------+-------+--------+-------------------+ +---------+--------+--+--------+-+  Vertebral PSV cm/s 35 EDV cm/s 7  +---------+--------+--+--------+-+  Left Carotid Findings: +----------+--------+-------+--------+--------------------------------+--------+             PSV cm/s EDV     Stenosis Plaque Description               Comments                       cm/s                                                        +----------+--------+-------+--------+--------------------------------+--------+  CCA Prox   99       20               smooth and heterogenous                    +----------+--------+-------+--------+--------------------------------+--------+  CCA Distal 117      12               smooth and heterogenous                    +----------+--------+-------+--------+--------------------------------+--------+  ICA Prox   214      29               calcific, smooth and                                                             heterogenous                               +----------+--------+-------+--------+--------------------------------+--------+  ICA Distal 111      14                                                tortuous  +----------+--------+-------+--------+--------------------------------+--------+  ECA        140      18                                                          +----------+--------+-------+--------+--------------------------------+--------+ +----------+--------+--------+--------+-------------------+             PSV cm/s EDV cm/s Describe Arm Pressure (mmHG)  +----------+--------+--------+--------+-------------------+  Subclavian 106                                             +----------+--------+--------+--------+-------------------+  +---------+--------+--+--------+-+---------+  Vertebral PSV cm/s 31 EDV cm/s 5 Antegrade  +---------+--------+--+--------+-+---------+   Summary: Right Carotid: Velocities in the right ICA are consistent with a 1-39% stenosis. Left Carotid: Velocities in the left ICA are consistent with a 1-39% stenosis. Vertebrals: Bilateral vertebral arteries demonstrate  antegrade flow. *See table(s) above for measurements and observations.  Electronically signed by Deitra Mayo MD on 05/13/2021 at 1:09:44 PM.    Final    DG ESOPHAGUS W SINGLE CM (SOL OR THIN BA)  Result Date: 05/16/2021 CLINICAL DATA:  Odynophagia; chest pain and vomiting with drinking and eating, difficulty with pills EXAM: ESOPHOGRAM/BARIUM SWALLOW TECHNIQUE: Single contrast examination was performed using thin barium. FLUOROSCOPY: Radiation Exposure Index (as provided by the fluoroscopic device): 1372.6 uGy*m2 DAP COMPARISON:  None. FINDINGS: Unremarkable hypopharyngeal contours. No aspiration. Overall esophageal distensibility and caliber appears reduced. There are mild relative areas of narrowing along the aortic arch and at the gastroesophageal junction. Disc does not prevent passage of contrast. There is no significant dysmotility. A small hiatal hernia is present. No significant gastroesophageal reflux during the course of the study. Barium pill did not pass through the mid esophagus despite successive swallows of contrast. IMPRESSION: Question overall reduction in esophageal distensibility and caliber. There is no comparison available. Mild relative areas of narrowing along the aortic arch and at the gastroesophageal junction without obstruction to passage of contrast. The barium pill did not pass to the air level of the mid esophagus. Small hiatal hernia. Patient reported pain with swallowing during examination. Electronically Signed   By: Macy Mis M.D.   On: 05/16/2021 09:58    Microbiology: Results for orders placed or performed  during the hospital encounter of 05/13/21  Resp Panel by RT-PCR (Flu A&B, Covid) Nasopharyngeal Swab     Status: None   Collection Time: 05/13/21  4:11 AM   Specimen: Nasopharyngeal Swab; Nasopharyngeal(NP) swabs in vial transport medium  Result Value Ref Range Status   SARS Coronavirus 2 by RT PCR NEGATIVE NEGATIVE Final    Comment: (NOTE) SARS-CoV-2 target nucleic acids are NOT DETECTED.  The SARS-CoV-2 RNA is generally detectable in upper respiratory specimens during the acute phase of infection. The lowest concentration of SARS-CoV-2 viral copies this assay can detect is 138 copies/mL. A negative result does not preclude SARS-Cov-2 infection and should not be used as the sole basis for treatment or other patient management decisions. A negative result may occur with  improper specimen collection/handling, submission of specimen other than nasopharyngeal swab, presence of viral mutation(s) within the areas targeted by this assay, and inadequate number of viral copies(<138 copies/mL). A negative result must be combined with clinical observations, patient history, and epidemiological information. The expected result is Negative.  Fact Sheet for Patients:  EntrepreneurPulse.com.au  Fact Sheet for Healthcare Providers:  IncredibleEmployment.be  This test is no t yet approved or cleared by the [place] FDA and  has been authorized for detection and/or diagnosis of SARS-CoV-2 by FDA under an Emergency Use Authorization (EUA). This EUA will remain  in effect (meaning this test can be used) for the duration of the COVID-19 declaration under Section 564(b)(1) of the Act, 21 U.S.C.section 360bbb-3(b)(1), unless the authorization is terminated  or revoked sooner.       Influenza A by PCR NEGATIVE NEGATIVE Final   Influenza B by PCR NEGATIVE NEGATIVE Final    Comment: (NOTE) The Xpert Xpress SARS-CoV-2/FLU/RSV plus assay is intended as an  aid in the diagnosis of influenza from Nasopharyngeal swab specimens and should not be used as a sole basis for treatment. Nasal washings and aspirates are unacceptable for Xpert Xpress SARS-CoV-2/FLU/RSV testing.  Fact Sheet for Patients: EntrepreneurPulse.com.au  Fact Sheet for Healthcare Providers: IncredibleEmployment.be  This test is not yet approved or cleared by the [place]  FDA and has been authorized for detection and/or diagnosis of SARS-CoV-2 by FDA under an Emergency Use Authorization (EUA). This EUA will remain in effect (meaning this test can be used) for the duration of the COVID-19 declaration under Section 564(b)(1) of the Act, 21 U.S.C. section 360bbb-3(b)(1), unless the authorization is terminated or revoked.  Performed at [facility], [place] [address] [place], [place] [address]   Surgical PCR screen     Status: None   Collection Time: 05/15/21  6:04 AM   Specimen: Nasal Mucosa; Nasal Swab  Result Value Ref Range Status   MRSA, PCR NEGATIVE NEGATIVE Final   Staphylococcus aureus NEGATIVE NEGATIVE Final    Comment: (NOTE) The Xpert SA Assay (FDA approved for NASAL specimens in patients 81 years of age and older), is one component of a comprehensive surveillance program. It is not intended to diagnose infection nor to guide or monitor treatment. Performed at [facility], [place] [address] [place], [place] [address]     Labs: CBC: Recent Labs  Lab 05/19/21 [phone] 05/20/21 0248 05/22/21 0304 05/23/21 0243 05/24/21 0228  WBC 6.9 8.1 8.9 8.7 10.6*  HGB 11.2* 11.3* 11.2* 11.2* 10.5*  HCT 34.6* 34.4* 34.0* 34.1* 33.0*  MCV 92.5 93.2 93.7 92.9 95.4  PLT 185 174 184 200 646   Basic Metabolic Panel: Recent Labs  Lab 05/19/21 0614 05/20/21 0248 05/22/21 0304 05/23/21 0243 05/24/21 0228  NA 140 142 137 137 137  K 4.8 4.4 4.7 4.5 4.8  CL 107 112* 100 101 103  CO2 _0 GLUCOSE 156* 139* 178* 191* 213*  BUN 9 15 29* 35* 57*  CREATININE 1.19 1.38* 1.39* 1.50* 1.59*  CALCIUM 8.3* 8.5* 9.5 9.5 9.2   Liver Function Tests: No results for input(s): AST, ALT, ALKPHOS, BILITOT, PROT, ALBUMIN in the last 168 hours. CBG: Recent Labs  Lab 05/23/21 1128 05/23/21 1627 05/23/21 2111 05/24/21 0736 05/24/21 1156  GLUCAP 182* 181* 140* 217* 153*    Discharge time spent: greater than 30 minutes.  Signed: Lucianna Ostlund, DO [facility] 05/24/2021 3:16 PM

## 2021-05-24 NOTE — Plan of Care (Signed)
  Problem: Education: Goal: Knowledge of General Education information will improve Description: Including pain rating scale, medication(s)/side effects and non-pharmacologic comfort measures Outcome: Adequate for Discharge   

## 2021-05-24 NOTE — Progress Notes (Signed)
Printed prescriptions given to patient and instructed him to fill those out at any Rx.  Pt also requested to receive bills printed out for this stay at [facility] and [facility] before his discharge.  Checked with SW Lattie Haw and instructed him to call a billing department on Monday.  Idolina Primer, RN

## 2021-05-29 ENCOUNTER — Ambulatory Visit: Payer: HMO

## 2021-06-04 ENCOUNTER — Ambulatory Visit (INDEPENDENT_AMBULATORY_CARE_PROVIDER_SITE_OTHER): Payer: HMO | Admitting: Internal Medicine

## 2021-06-04 ENCOUNTER — Encounter: Payer: Self-pay | Admitting: Internal Medicine

## 2021-06-04 VITALS — BP 149/58 | HR 72 | Temp 99.2°F | Ht 70.5 in | Wt 163.7 lb

## 2021-06-04 DIAGNOSIS — I129 Hypertensive chronic kidney disease with stage 1 through stage 4 chronic kidney disease, or unspecified chronic kidney disease: Secondary | ICD-10-CM | POA: Diagnosis not present

## 2021-06-04 DIAGNOSIS — L299 Pruritus, unspecified: Secondary | ICD-10-CM

## 2021-06-04 DIAGNOSIS — N1831 Chronic kidney disease, stage 3a: Secondary | ICD-10-CM

## 2021-06-04 DIAGNOSIS — N401 Enlarged prostate with lower urinary tract symptoms: Secondary | ICD-10-CM | POA: Diagnosis not present

## 2021-06-04 DIAGNOSIS — F17200 Nicotine dependence, unspecified, uncomplicated: Secondary | ICD-10-CM

## 2021-06-04 DIAGNOSIS — R3914 Feeling of incomplete bladder emptying: Secondary | ICD-10-CM

## 2021-06-04 DIAGNOSIS — R112 Nausea with vomiting, unspecified: Secondary | ICD-10-CM

## 2021-06-04 DIAGNOSIS — E785 Hyperlipidemia, unspecified: Secondary | ICD-10-CM

## 2021-06-04 DIAGNOSIS — E1122 Type 2 diabetes mellitus with diabetic chronic kidney disease: Secondary | ICD-10-CM

## 2021-06-04 DIAGNOSIS — R1314 Dysphagia, pharyngoesophageal phase: Secondary | ICD-10-CM | POA: Diagnosis not present

## 2021-06-04 DIAGNOSIS — E11311 Type 2 diabetes mellitus with unspecified diabetic retinopathy with macular edema: Secondary | ICD-10-CM

## 2021-06-04 DIAGNOSIS — K5900 Constipation, unspecified: Secondary | ICD-10-CM

## 2021-06-04 DIAGNOSIS — H3530 Unspecified macular degeneration: Secondary | ICD-10-CM

## 2021-06-04 DIAGNOSIS — I10 Essential (primary) hypertension: Secondary | ICD-10-CM

## 2021-06-04 DIAGNOSIS — I480 Paroxysmal atrial fibrillation: Secondary | ICD-10-CM

## 2021-06-04 MED ORDER — POLYETHYLENE GLYCOL 3350 17 G PO PACK: 17.0000 g | PACK | Freq: Every day | ORAL | 0 refills | Status: AC

## 2021-06-04 NOTE — Assessment & Plan Note (Signed)
This has resolved since his his hospitalization

## 2021-06-04 NOTE — Assessment & Plan Note (Signed)
Patient coming in with what he is calling generalized itchiness all over his body since his recent hospitalization. Upon examination patient still had two EKG leads on his stomach that might be locally irritating his skin. These were removed. No rash or skin changes noted over his back which he notes as the most itchy area.  - recommend conservative therapy with moisturizers

## 2021-06-04 NOTE — Progress Notes (Signed)
° °  CC: trouble eating  HPI:  [name] is a 81 y.o. PMH noted below, who presents to the [facility] with complaints of trouble eating. To see the management of his acute and chronic conditions, please refer to the A&P note under the encounters tab.   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 However he has smoked less since his abdominal pain started. Does not drink alcohol anymore. 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 He lives alone.  Review of Systems:  positive for abdominal pain, painful swallowing, pain with defecation, constipation, itching, decreased appetite.   Physical Exam: Gen: Elderly man in NAD HEENT: normocephalic atraumatic, MMM CV: RRR, no m/r/g   Resp: CTAB, normal WOB  GI: soft, nontender MSK: moves all extremities without difficulty Skin:warm and dry Neuro:alert answering questions appropriately Psych: normal affect   Assessment & Plan:   See Encounters Tab for problem based charting.  Patient discussed with Dr. Evette Doffing

## 2021-06-04 NOTE — Assessment & Plan Note (Signed)
Patient is not currently on any medications, says that he has had a lot of side effects from medications in the past. Not open to starting one today. His blood pressure today in clinic was 149/58 but was in the 130s during his recent hospitalization.  - recheck bp at next visit and discuss need for medication

## 2021-06-04 NOTE — Assessment & Plan Note (Signed)
Patient takes eliquis daily

## 2021-06-04 NOTE — Assessment & Plan Note (Signed)
Last A1c 6.1 three weeks ago. Patient does not check his blood sugar regularly.

## 2021-06-04 NOTE — Assessment & Plan Note (Signed)
Patient unable to tolerate statin therapy in the past. Says he does not want to be on medication at this time.

## 2021-06-04 NOTE — Patient Instructions (Signed)
Scott Willis  It was a pleasure seeing you in the clinic today.   We talked about your recent hospitalization, your abdominal pain, your urinary frequency at night, your constipation, and gave you your flu shot.  Abdominal pain- I will refer you back to the stomach doctor Urinary frequency- I am referring you to urology Constipation- take miralax daily. If you are still constipated after several days you can increase miralax to twice daily  Please call our clinic at [phone] if you have any questions or concerns. The best time to call is Monday-Friday from 9am-4pm, but there is someone available 24/7 at the same number. If you need medication refills, please notify your pharmacy one week in advance and they will send [place] a request.   Thank you for letting us take part in your care. We look forward to seeing you next time!

## 2021-06-04 NOTE — Assessment & Plan Note (Signed)
Since being in the hospital patient has had constipation. He has not taken anything for it. He feels the urge to defecate but has trouble doing so as his stools are very hard. - start daily miralax, may titrate up to twice a day if needed

## 2021-06-04 NOTE — Assessment & Plan Note (Addendum)
Recheck BMP today  Addendum: kidney function stable, potassium slightly elevated at 5.4 and BUN is slightly elevated. Will have patient come back for a lab only visit in 1-2 weeks to recheck a BMP

## 2021-06-04 NOTE — Assessment & Plan Note (Signed)
Vision limiting, patient struggles to read his paperwork and writes in large sharpie to help himself see. Follows regularly with the ophthalmologist.

## 2021-06-04 NOTE — Assessment & Plan Note (Signed)
Patient says that he is 81 years old and has smoked for all his life. He has no interest in quitting as it is the only thing left in life that brings him joy. However since his stomach pains started he is smoking less.

## 2021-06-04 NOTE — Assessment & Plan Note (Signed)
Patient takes finasteride and flomax daily but still has multiple episodes of nocturnal urination with incomplete bladder emptying. Has seen urology in the past. - referral placed to urology

## 2021-06-04 NOTE — Assessment & Plan Note (Signed)
Patient describes feeling like he can't swallow and has trouble eating solid food. He is able to drink boosts but it takes him an extremely long to time eat more solid foods like meats and this has not gotten better since being in the hospital. Weight is up 8 lbs today so he is likely have adequate intake of liquids at this time. However given the severity of his symptoms and that they have not improved will refer him back to GI. Patient requests to see [place]. - GI referral - continue sulcrafate, protonix

## 2021-06-05 LAB — CMP14 + ANION GAP
ALT: 17 IU/L (ref 0–44)
AST: 19 IU/L (ref 0–40)
Albumin/Globulin Ratio: 1.3 (ref 1.2–2.2)
Albumin: 4.2 g/dL (ref 3.7–4.7)
Alkaline Phosphatase: 62 IU/L (ref 44–121)
Anion Gap: 14 mmol/L (ref 10.0–18.0)
BUN/Creatinine Ratio: 51 — ABNORMAL HIGH (ref 10–24)
BUN: 73 mg/dL — ABNORMAL HIGH (ref 8–27)
Bilirubin Total: 0.3 mg/dL (ref 0.0–1.2)
CO2: 24 mmol/L (ref 20–29)
Calcium: 9.5 mg/dL (ref 8.6–10.2)
Chloride: 101 mmol/L (ref 96–106)
Creatinine, Ser: 1.44 mg/dL — ABNORMAL HIGH (ref 0.76–1.27)
Globulin, Total: 3.2 g/dL (ref 1.5–4.5)
Glucose: 102 mg/dL — ABNORMAL HIGH (ref 70–99)
Potassium: 5.4 mmol/L — ABNORMAL HIGH (ref 3.5–5.2)
Sodium: 139 mmol/L (ref 134–144)
Total Protein: 7.4 g/dL (ref 6.0–8.5)
eGFR: 49 mL/min/{1.73_m2} — ABNORMAL LOW (ref >59)

## 2021-06-05 NOTE — Progress Notes (Signed)
Internal Medicine Clinic Attending ° °Case discussed with Dr. DeMaio  At the time of the visit.  We reviewed the resident’s history and exam and pertinent patient test results.  I agree with the assessment, diagnosis, and plan of care documented in the resident’s note. ° ° °

## 2021-06-05 NOTE — Addendum Note (Signed)
Addended by: Inda Coke on: 06/05/2021 02:18 PM   Modules accepted: Orders

## 2021-06-06 ENCOUNTER — Encounter: Payer: HMO | Admitting: Physician Assistant

## 2021-06-11 NOTE — Progress Notes (Signed)
Cardiology Office Note:    Date:  06/18/2021   ID:  Scott, Willis [birth year], MRN [id number]  PCP:  Scarlett Presto, MD   [facility] Providers Cardiologist:  Buford Dresser, MD      Referring MD: Eunice Blase, MD   Follow-up for sinus pause  History of Present Illness:    Scott Willis is a 81 y.o. male with a hx of HTN, near syncope, paroxysmal atrial fibrillation, and gastric ulcer.  He was admitted to the hospital on 05/14/2021 and discharged on 05/24/2021.  He was noted to have sinus pauses.  Cardiology was consulted.  He was transferred to [facility] for further evaluation by EP and possible placement of PPM.  With further evaluation from electrophysiology it was felt the patient's bradycardia/transient block was related to increased vagal tone during vomiting/retching and there was not felt to be a good indication for PPM.  His nausea and vomiting persisted.  He was unable to tolerate p.o.  GI was consulted and performed EGD.  It showed esophagitis.  He underwent esophageal dilation for esophageal stricture.  Unfortunately he continued to have severe nausea and vomiting post dilation.  Patient reported his heart rate decreasing with episodes of nauseousness and vomiting.  Electrophysiology signed off and felt the patient's bradycardia had resolved after dilation of his stricture.  He was given Reglan 10 mg 3 times daily.  It was felt that he would continue to improve with time.  On 05/24/2021 it was noted that the patient had 2 instances of bradycardia in the 30s with Mobitz type II on telemetry.  The patient was sleeping with both occurrences.  Case was discussed with Dr. Quentin Ore.  It was felt that because these instances were not sustained there was no indication for pacemaker.  He was discharged in fair condition.  He presents to the clinic today for follow-up evaluation states He feels well. He is back to working 60 hours per week.  We reviewed his  recent hospitalization.  He reports since he has been home he is eating better and feels like he is getting his strength back.  He denies further episodes of coughing or vomiting.  His EKG today shows normal sinus rhythm 62 bpm.  We reviewed recommendations from electrophysiology and he expressed understanding.  He denies episodes of dizziness or fainting.  I will give him the salty 6 diet sheet, have him continue his physical activity, and plan follow-up for 4 to 6 months.  Today he denies chest pain, shortness of breath, lower extremity edema, fatigue, palpitations, melena, hematuria, hemoptysis, diaphoresis, weakness, presyncope, syncope, orthopnea, and PND.   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    Ulcer    gastric    Past Surgical History:  Procedure Laterality Date   BACK SURGERY  1998   Dr.Nitka   BACK SURGERY  03/2006   Removed Rods Dr.Nitka    CARPAL TUNNEL RELEASE  08/2006   CARPAL TUNNEL RELEASE  08/2006   Dr.Nitka    CATARACT EXTRACTION  02/21/2007   Dr.Epps    ESOPHAGOGASTRODUODENOSCOPY (EGD) WITH PROPOFOL N/A 05/19/2021   Procedure: ESOPHAGOGASTRODUODENOSCOPY (EGD) WITH PROPOFOL;  Surgeon: Carol Ada, MD;  Location: [place];  Service: Endoscopy;  Laterality: N/A;  Dysphagia   EYE SURGERY  2013   Right eye every 8 weeks    KIDNEY STONE SURGERY  08/11/2007   Dr.Tannenbaum    LITHOTRIPSY  08/11/2007   Dr.Nesi   REFRACTIVE SURGERY  09/2005   Macular Degeneration Dr.Mathews    SAVORY DILATION N/A 05/19/2021   Procedure: SAVORY DILATION;  Surgeon: Carol Ada, MD;  Location: [place];  Service:  Endoscopy;  Laterality: N/A;   SHOULDER ARTHROSCOPY WITH SUBACROMIAL DECOMPRESSION, ROTATOR CUFF REPAIR AND BICEP TENDON REPAIR Left 08/22/2013   Procedure:  LEFT SHOULDER DIAGNOSTIC OPERATIVE ARTHROSCOPY ,EXTENSIVE DEBRIDEMENT, BICEPS RELEASE AND TENODESIS, ROTATOR CUFF REPAIR, SUBACROMIAL DECOMPRESSION;  Surgeon: Meredith Pel, MD;  Location: [place];  Service: Orthopedics;  Laterality: Left;  LEFT SHOULDER DOA,EXTENSIVE DEBRIDEMENT, BICEPS RELEASE AND TENODESIS, ROTATOR CUFF REPAIR, SUBACROMIAL DECOMPRESSION,   TEMPORARY PACEMAKER N/A 05/19/2021   Procedure: TEMPORARY PACEMAKER;  Surgeon: Larey Dresser, MD;  Location: [place] [facility];  Service: Cardiovascular;  Laterality: N/A;    Current Medications: Current Meds  Medication Sig   apixaban (ELIQUIS) 5 MG TABS tablet Take 1 tablet (5 mg total) by mouth 2 (two) times daily.   Cholecalciferol (VITAMIN D-3) 125 MCG (5000 UT) TABS Take 5,000 Units by mouth daily.   feeding supplement (ENSURE ENLIVE / ENSURE PLUS) LIQD Take 237 mLs by mouth 3 (three) times daily with meals.   finasteride (PROSCAR) 5 MG tablet TAKE 1 TABLET BY MOUTH EVERY DAY (Patient taking differently: Take 5 mg by mouth daily.)   Magnesium Oxide 400 (240 Mg) MG TABS TAKE 1 TABLET BY MOUTH EVERY DAY (Patient taking differently: Take 400 mg by mouth daily.)   metoCLOPramide (REGLAN) 10 MG tablet Take 1 tablet (10 mg total) by mouth 3 (three) times daily before meals.   Multiple Vitamins-Minerals (PRESERVISION AREDS 2 PO) Take 1 capsule by mouth daily.   pantoprazole (PROTONIX) 40 MG tablet Take 1 tablet (40 mg total) by mouth 2 (two) times daily.   polyethylene glycol (MIRALAX) 17 g packet Take 17 g by mouth daily.   polyvinyl alcohol (LIQUIFILM TEARS) 1.4 % ophthalmic solution Place 1 drop into both eyes 3 (three) times daily as needed for dry eyes (irritation).   Prodigy Twist Top Lancets 28G MISC    SYSTANE COMPLETE 0.6 % SOLN Place 1 drop into both eyes 3 (three) times  daily as needed (for irritation).   tamsulosin (FLOMAX) 0.4 MG CAPS capsule TAKE 1 CAPSULE BY MOUTH EVERY DAY (Patient taking differently: Take 0.4 mg by mouth daily.)   trimethoprim-polymyxin b (POLYTRIM) ophthalmic solution Place 1 drop into the right eye See admin instructions. 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[address]  Tobacco Use   Smoking status: Every  Day    Packs/day: 1.50    Years: 59.00    Pack years: 88.50    Types: Cigarettes   Smokeless tobacco: Never  Substance and Sexual Activity   Alcohol use: No    Alcohol/week: 0.0 standard drinks   Drug use: No   Sexual activity: Not on file  Other Topics Concern   Not on file  Social History Narrative   Not on file   Social Determinants of Health   Financial Resource Strain: Not on file  Food Insecurity: Not on file  Transportation Needs: Not on file  Physical Activity: Not on file  Stress: Not on file  Social Connections: Not on file     Family History: The patient's family history includes Alzheimer's disease in his mother; Colon cancer in his father; Diabetes in his sister.  ROS:   Please see the history of present illness.     All other systems reviewed and are negative.   Risk Assessment/Calculations:           Physical Exam:    VS:  BP 138/74    Pulse 62    Ht 5' 10.5" (1.791 m)    Wt 164 lb 14.4 oz (74.8 kg)    BMI 23.33 kg/m     Wt Readings from Last 3 Encounters:  06/18/21 164  lb 14.4 oz (74.8 kg)  06/04/21 163 lb 11.2 oz (74.3 kg)  05/13/21 155 lb (70.3 kg)     GEN:  Well nourished, well developed in no acute distress HEENT: Normal NECK: No JVD; No carotid bruits LYMPHATICS: No lymphadenopathy CARDIAC: RRR, no murmurs, rubs, gallops RESPIRATORY:  Clear to auscultation without rales, wheezing or rhonchi  ABDOMEN: Soft, non-tender, non-distended MUSCULOSKELETAL:  No edema; No deformity  SKIN: Warm and dry NEUROLOGIC:  Alert and oriented x 3 PSYCHIATRIC:  Normal affect    EKGs/Labs/Other Studies Reviewed:    The following studies were reviewed today:  Echocardiogram 05/13/2021  IMPRESSIONS     1. Left ventricular ejection fraction, by estimation, is 60 to 65%. The  left ventricle has normal function. The left ventricle has no regional  wall motion abnormalities. Left ventricular diastolic parameters are  consistent with Grade I diastolic  dysfunction (impaired relaxation).   2. Right ventricular systolic function is normal. The right ventricular  size is normal. There is normal pulmonary artery systolic pressure.   3. The mitral valve is normal in structure. Mild mitral valve  regurgitation. No evidence of mitral stenosis.   4. The aortic valve is normal in structure. Aortic valve regurgitation is  trivial. No aortic stenosis is present.   5. The inferior vena cava is normal in size with greater than 50%  respiratory variability, suggesting right atrial pressure of 3 mmHg.   Conclusion(s)/Recommendation(s): No intracardiac source of embolism  detected on this transthoracic study. Consider a transesophageal  echocardiogram to exclude cardiac source of embolism if clinically  indicated.  EKG:  EKG is  ordered today.  The ekg ordered today demonstrates normal sinus rhythm 62 bpm  Recent Labs: 08/20/2020: TSH 3.46 05/16/2021: Magnesium 2.6 05/24/2021: Hemoglobin 10.5; Platelets 203 06/04/2021: ALT 17; BUN 73; Creatinine, Ser 1.44; Potassium 5.4;  Sodium 139  Recent Lipid Panel    Component Value Date/Time   CHOL 193 05/14/2021 0500   CHOL 186 06/21/2014 0919   TRIG 76 05/14/2021 0500   HDL 46 05/14/2021 0500   HDL 53 06/21/2014 0919   CHOLHDL 4.2 05/14/2021 0500   VLDL 15 05/14/2021 0500  LDLCALC 132 (H) 05/14/2021 0500   LDLCALC 137 (H) 08/20/2020 9024    ASSESSMENT & PLAN    Sinus pause-no further episodes of lightheadedness or dizziness.  Heart rate today 62 noted during admission 2/1 - 2/11 for episodes of nausea and vomiting.  He was noted to have sinus pauses with episodes of vomiting.  He underwent EGD and received esophageal dilation.  He became bradycardic in the 30s and have Mobitz type II on telemetry.  His episodes were nonsustained.  EP did not feel placement of PPM was warranted.  Near syncope-no further episodes of presyncope or syncope.  Was noted in the setting of vomiting and stimulation of vagal reflex.  Not a candidate for AV nodal blocking agents Maintain p.o. hydration  Essential hypertension-BP today 138/74.  Well-controlled at home. Continue hydralazine  Heart healthy low-sodium diet-salty 6 given Increase physical activity as tolerated  Stage IIIa CKD-creatinine 1.44 on 06/04/2021.  Baseline creatinine 1.3-1.5.  Euvolemic today. Follows with PCP   Type 2 diabetes-glucose 102 on 06/04/2021 Continue current medication regimen Follows with PCP  Disposition: Follow-up with Dr. Harrell Gave or APP in 4-6 months.       Medication Adjustments/Labs and Tests Ordered: Current medicines are reviewed at length with the patient today.  Concerns regarding medicines are outlined above.  No orders of the defined types were placed in this encounter.  No orders of the defined types were placed in this encounter.      Signed, Deberah Pelton, NP  06/18/2021 8:38 AM      Notice: This dictation was prepared with Dragon dictation along with smaller phrase technology. Any transcriptional errors that  result from this process are unintentional and may not be corrected upon review.  I spent 14 minutes examining this patient, reviewing medications, and using patient centered shared decision making involving her cardiac care.  Prior to her visit I spent greater than 20 minutes reviewing her past medical history,  medications, and prior cardiac tests.

## 2021-06-18 ENCOUNTER — Encounter (HOSPITAL_BASED_OUTPATIENT_CLINIC_OR_DEPARTMENT_OTHER): Payer: Self-pay | Admitting: General Practice

## 2021-06-18 ENCOUNTER — Ambulatory Visit (HOSPITAL_BASED_OUTPATIENT_CLINIC_OR_DEPARTMENT_OTHER): Payer: HMO | Admitting: General Practice

## 2021-06-18 ENCOUNTER — Other Ambulatory Visit: Payer: Self-pay

## 2021-06-18 VITALS — BP 138/74 | HR 62 | Ht 70.5 in | Wt 164.9 lb

## 2021-06-18 DIAGNOSIS — I10 Essential (primary) hypertension: Secondary | ICD-10-CM

## 2021-06-18 DIAGNOSIS — R55 Syncope and collapse: Secondary | ICD-10-CM

## 2021-06-18 DIAGNOSIS — I455 Other specified heart block: Secondary | ICD-10-CM | POA: Diagnosis not present

## 2021-06-18 DIAGNOSIS — N1831 Chronic kidney disease, stage 3a: Secondary | ICD-10-CM | POA: Diagnosis not present

## 2021-06-18 NOTE — Addendum Note (Signed)
Addended by: Ernie Hew D on: 06/18/2021 03:53 PM ? ? Modules accepted: Orders ? ?

## 2021-06-18 NOTE — Patient Instructions (Signed)
Medication Instructions:  ?Your physician recommends that you continue on your current medications as directed. Please refer to the Current Medication list given to you today. ? ?*If you need a refill on your cardiac medications before your next appointment, please call your pharmacy* ? ? ?Follow-Up: ?At [facility], you and your health needs are our priority.  As part of our continuing mission to provide you with exceptional heart care, we have created designated Provider Care Teams.  These Care Teams include your primary Cardiologist (physician) and Advanced Practice Providers (APPs -  Physician Assistants and Nurse Practitioners) who all work together to provide you with the care you need, when you need it. ? ?We recommend signing up for the patient portal called "MyChart".  Sign up information is provided on this After Visit Summary.  MyChart is used to connect with patients for Virtual Visits (Telemedicine).  Patients are able to view lab/test results, encounter notes, upcoming appointments, etc.  Non-urgent messages can be sent to your provider as well.   ?To learn more about what you can do with MyChart, go to [email or web address].   ? ?Your next appointment:   ?4-6 month(s) ? ?The format for your next appointment:   ?In Person ? ?Provider:   ?Buford Dresser, MD  ? ? ?Other Instructions ?Coletta Memos, NP has recommended starting a low salt diet and increasing your physical activity as tolerated. Please review the information below. Thank you! ? ? ? ? ? ? ? ?American Heart Association Recommendations for Physical Activity in Adults ? ?Are you fitting in at least 150 minutes (2.5 hours) of heart-pumping physical activity per week? If not, you?re not alone. Only about one in five adults and teens get enough exercise to maintain good health. Being more active can help all people think, feel and sleep better and perform daily tasks more easily. And if you?re sedentary, sitting less is a great place  to start. ? ?These recommendations are based on the Physical Activity Guidelines for Americans, 2nd edition, published by the U.S. Department of Health and Financial controller, Office of Disease Prevention and Health Promotion. They recommend how much physical activity we need to be healthy. The guidelines are based on current scientific evidence supporting the connections between physical activity, overall health and well-being, disease prevention and quality of life. ? ?AHA Physical Activity Recommendations for Adults ? ? ? ? ?Get at least 150 minutes per week of moderate-intensity aerobic activity or 75 minutes per week of vigorous aerobic activity, or a combination of both, preferably spread throughout the week. ? ?Add moderate- to high-intensity muscle-strengthening activity (such as resistance or weights) on at least 2 days per week. ?Spend less time sitting. Even light-intensity activity can offset some of the risks of being sedentary. ? ?Gain even more benefits by being active at least 300 minutes (5 hours) per week. ?Increase amount and intensity gradually over time. ? ? ?What is intensity? ? ?Physical activity is anything that moves your body and burns calories. This includes things like walking, climbing stairs and stretching. ? ?Aerobic (or ?cardio?) activity gets your heart rate up and benefits your heart by improving cardiorespiratory fitness. When done at moderate intensity, your heart will beat faster and you?ll breathe harder than normal, but you?ll still be able to talk. Think of it as a medium or moderate amount of effort. ? ?Examples of moderate-intensity aerobic activities: ? ?brisk walking (at least 2.5 miles per hour) ?water aerobics ?dancing (ballroom or social) ?gardening ?tennis (doubles) ?biking slower than  10 miles per hour ?Vigorous intensity activities will push your body a little further. They will require a higher amount of effort. You?ll probably get warm and begin to sweat. You won?t be  able to talk much without getting out of breath. ? ?Examples of vigorous-intensity aerobic activities: ? ?hiking uphill or with a heavy backpack ?running ?swimming laps ?aerobic dancing ?heavy yardwork like continuous digging or hoeing ?tennis (singles) ?cycling 10 miles per hour or faster ?jumping rope ?Knowing your target heart rate can also help you track the intensity of your activities. ? ?For maximum benefits, include both moderate- and vigorous-intensity activity in your routine along with strengthening and stretching exercises. ? ?What if I?m just starting to get active? ? ?Don?t worry if you can?t reach 150 minutes per week just yet. Everyone has to start somewhere. Even if you've been sedentary for years, today is the day you can begin to make healthy changes in your life. Set a reachable goal for today. You can work up toward the recommended amount by increasing your time as you get stronger. Don't let all-or-nothing thinking keep you from doing what you can every day. ? ?The simplest way to get moving and improve your health is to start walking. It's free, easy and can be done just about anywhere, even in place. ? ?Any amount of movement is better than none. And you can break it up into short bouts of activity throughout the day. Taking a brisk walk for five or ten minutes a few times a day will add up. ? ?If you have a chronic condition or disability, talk with your healthcare provider about what types and amounts of physical activity are right for you before making too many changes. But don?t wait! Get started today by simply sitting less and moving more, whatever that looks like for you. ? ?The takeaway:  Move more, with more intensity, and sit less. ?Science has linked being inactive and sitting too much with higher risk of heart disease, type 2 diabetes, colon and lung cancers, and early death. ? ?It?s clear that being more active benefits everyone and helps [place] live longer, healthier lives. ? ?Here  are some of the big wins: ? ?Lower risk of heart disease, stroke, type 2 diabetes, high blood pressure, dementia and Alzheimer?s, several types of cancer, and some complications of pregnancy\ ? ?Better sleep, including improvements in insomnia and obstructive sleep apnea ? ?Improved cognition, including memory, attention and processing speed ? ?Less weight gain, obesity and related chronic health conditions ? ?Better bone health and balance, with less risk of injury from falls ? ?Fewer symptoms of depression and anxiety ? ?Better quality of life and sense of overall well-being ? ? ?

## 2021-06-25 ENCOUNTER — Encounter: Payer: Self-pay | Admitting: Internal Medicine

## 2021-06-25 ENCOUNTER — Other Ambulatory Visit: Payer: HMO

## 2021-06-25 ENCOUNTER — Other Ambulatory Visit: Payer: Self-pay

## 2021-06-25 ENCOUNTER — Ambulatory Visit (INDEPENDENT_AMBULATORY_CARE_PROVIDER_SITE_OTHER): Payer: HMO | Admitting: Internal Medicine

## 2021-06-25 DIAGNOSIS — N1831 Chronic kidney disease, stage 3a: Secondary | ICD-10-CM

## 2021-06-25 DIAGNOSIS — L299 Pruritus, unspecified: Secondary | ICD-10-CM | POA: Diagnosis not present

## 2021-06-25 DIAGNOSIS — I129 Hypertensive chronic kidney disease with stage 1 through stage 4 chronic kidney disease, or unspecified chronic kidney disease: Secondary | ICD-10-CM | POA: Diagnosis not present

## 2021-06-25 DIAGNOSIS — I10 Essential (primary) hypertension: Secondary | ICD-10-CM

## 2021-06-25 NOTE — Assessment & Plan Note (Addendum)
BP Readings from Last 3 Encounters:  ?06/25/21 (!) 150/60  ?06/18/21 138/74  ?06/04/21 (!) 149/58  ? ?The patient is not currently on medications for his blood pressures. He had two elevated BP values in the 433I systolic during his visit today.  Discussed the benefits of starting antihypertensive therapy as well as the risks of not starting a blood pressure medication.  The patient understands but states that he does not want to start a blood pressure medication at this time. ? ?Plan: ?Will continue to monitor. ?

## 2021-06-25 NOTE — Assessment & Plan Note (Signed)
Patient with potassium of 5.4 and elevated BUN at last visit 1 month ago.  He is here to recheck his BMP.  We will contact patient with results.  He will be seen in 2 weeks, and he was instructed to bring all new medications that were prescribed for him at his recent discharge to help determine whether this could be contributing to his elevated potassium or his itching. ?

## 2021-06-25 NOTE — Assessment & Plan Note (Signed)
The patient endorses generalized itchiness, particularly in the groin area.  He says that he has used Benadryl cream which helps the itching.  He says that the itching is still present in the groin area, however there is no rash there.  He is requesting a male provider for his next visit to examine this area. ? ?Plan: ?Continue conservative therapy with Benadryl cream.  Will address at next visit. ?

## 2021-06-25 NOTE — Patient Instructions (Addendum)
Thank you, [name] for allowing [place] to provide your care today.  ? ?Blood pressure: We recommend that you start a blood pressure medication, but we will hold off for now per your request. ? ?At your next appointment, please bring all of your medications that were prescribed for you in the hospital. This will help [place] to see if this might be contributing to your itching or potassium levels. ? ? ?I have ordered the following labs for you: ? ?Lab Orders  ?No laboratory test(s) ordered today  ?  ? ?Referrals ordered today:  ? ?Referral Orders  ?No referral(s) requested today  ?  ? ?I have ordered the following medication/changed the following medications:  ? ?Stop the following medications: ?There are no discontinued medications.  ? ?Start the following medications: ?No orders of the defined types were placed in this encounter. ?  ? ?Follow up:  2 weeks   ? ? ?Should you have any questions or concerns please call the internal medicine clinic at [phone].   ? ? ? ?

## 2021-06-25 NOTE — Progress Notes (Signed)
? ?  CC: routine follow-up ? ?HPI: ? ?[name] is a 81 y.o. with past medical history as noted below who presents to the clinic today for a routine follow-up. Please see problem-based list for further details, assessments, and plans.  ? ?Past Medical History:  ?Diagnosis Date  ? Arthritis   ? Benign essential hypertension   ? Blepharitis, squamous   ? Right Eye   ? Blindness   ? BPH (benign prostatic hyperplasia)   ? DM (diabetes mellitus) type II uncontrolled with eye manifestation   ? fasting 110s  ? Hyperlipidemia LDL goal < 100   ? Kidney stones   ? Low serum testosterone level   ? Macular degeneration   ? Osteoporosis due to androgen therapy   ? Ulcer   ? gastric  ? ?Review of Systems: Negative aside from that listed in individualized problem based charting.  ? ?Physical Exam: ? ?Vitals:  ? 06/25/21 0840 06/25/21 0907  ?BP: (!) 154/53 (!) 150/60  ?Pulse: 65 61  ?Resp: (!) 28   ?Temp: 98.1 ?F (36.7 ?C)   ?TempSrc: Oral   ?SpO2: 100%   ?Weight: 165 lb 3.2 oz (74.9 kg)   ?Height: 5' 10.5" (1.791 m)   ? ?General: NAD, nl appearance ?HE: Normocephalic, atraumatic, EOMI, Conjunctivae normal ?ENT: No congestion, no rhinorrhea, no exudate or erythema  ?Cardiovascular: Normal rate, regular rhythm. No murmurs, rubs, or gallops ?Pulmonary: Effort normal, breath sounds normal. No wheezes, rales, or rhonchi ?Abdominal: soft, nontender, bowel sounds present ?Musculoskeletal: no swelling, deformity, injury or tenderness in extremities ?Skin: Warm, dry, no bruising, erythema, or rash ?Psychiatric/Behavioral: normal mood, normal behavior   ? ? ?Assessment & Plan:  ? ?See Encounters Tab for problem based charting. ? ?Patient discussed with Dr.  Saverio Danker ? ?

## 2021-06-26 LAB — BMP8+ANION GAP
Anion Gap: 13 mmol/L (ref 10.0–18.0)
BUN/Creatinine Ratio: 20 (ref 10–24)
BUN: 25 mg/dL (ref 8–27)
CO2: 26 mmol/L (ref 20–29)
Calcium: 9.2 mg/dL (ref 8.6–10.2)
Chloride: 101 mmol/L (ref 96–106)
Creatinine, Ser: 1.28 mg/dL — ABNORMAL HIGH (ref 0.76–1.27)
Glucose: 99 mg/dL (ref 70–99)
Potassium: 4.3 mmol/L (ref 3.5–5.2)
Sodium: 140 mmol/L (ref 134–144)
eGFR: 57 mL/min/{1.73_m2} — ABNORMAL LOW (ref >59)

## 2021-06-26 NOTE — Progress Notes (Signed)
Internal Medicine Clinic Attending ? ?I saw and evaluated the patient.  I personally confirmed the key portions of the history and exam documented by Dr. Lorin Glass and I reviewed pertinent patient test results.  The assessment, diagnosis, and plan were formulated together and I agree with the documentation in the resident?s note. Offered a skin and GU exam given ongoing testicular pruritus, however patient declined stating he would like a male provider for this exam. Reports no masses or other testicular changes. Discussed risk of untreated hypertension, patient acknowledged but continued to decline medication at this time. Will schedule f/u within two weeks for medication review and GU exam. ?

## 2021-07-04 ENCOUNTER — Ambulatory Visit (INDEPENDENT_AMBULATORY_CARE_PROVIDER_SITE_OTHER): Payer: HMO | Admitting: Podiatry

## 2021-07-04 ENCOUNTER — Encounter: Payer: Self-pay | Admitting: Podiatry

## 2021-07-04 ENCOUNTER — Other Ambulatory Visit: Payer: Self-pay

## 2021-07-04 DIAGNOSIS — E1142 Type 2 diabetes mellitus with diabetic polyneuropathy: Secondary | ICD-10-CM | POA: Diagnosis not present

## 2021-07-04 DIAGNOSIS — M79676 Pain in unspecified toe(s): Secondary | ICD-10-CM

## 2021-07-04 DIAGNOSIS — M2141 Flat foot [pes planus] (acquired), right foot: Secondary | ICD-10-CM

## 2021-07-04 DIAGNOSIS — M2142 Flat foot [pes planus] (acquired), left foot: Secondary | ICD-10-CM

## 2021-07-04 DIAGNOSIS — B351 Tinea unguium: Secondary | ICD-10-CM | POA: Diagnosis not present

## 2021-07-04 NOTE — Progress Notes (Signed)
This patient returns to my office for at risk foot care.  This patient requires this care by a professional since this patient will be at risk due to having diabetes with neuropathy.  This patient is unable to cut nails himself since the patient cannot reach his nails.These nails are painful walking and wearing shoes.  This patient presents for at risk foot care today.  Patient states his callus causes occasional pain. ? ?General Appearance  Alert, conversant and in no acute stress. ? ?Vascular  Dorsalis pedis and posterior tibial  pulses are palpable  bilaterally.  Capillary return is within normal limits  bilaterally. Temperature is within normal limits  bilaterally. ? ?Neurologic  Senn-Weinstein monofilament wire test diminished/absent   bilaterally. Muscle power within normal limits bilaterally. ? ?Nails Thick disfigured discolored nails with subungual debris  from hallux to fifth toes bilaterally. No evidence of bacterial infection or drainage bilaterally. ? ?Orthopedic  No limitations of motion  feet .  No crepitus or effusions noted.  No bony pathology or digital deformities noted. HAV  B/L. ? ?Skin  normotropic skin .   porokeratosis noted sub 5th met left foot resolved.  No signs of infections or ulcers noted.    ? ?Onychomycosis  Pain in right toes  Pain in left toes   ? ?Consent was obtained for treatment procedures.   Mechanical debridement of nails 1-5  bilaterally performed with a nail nipper.  Filed with dremel without incident.  ? ? ?Return office visit   10 weeks                   Told patient to return for periodic foot care and evaluation due to potential at risk complications.  ? ? ?Gregory Mayer DPM  ?

## 2021-07-09 ENCOUNTER — Encounter: Payer: HMO | Admitting: Internal Medicine

## 2021-07-11 ENCOUNTER — Ambulatory Visit: Payer: HMO

## 2021-07-11 DIAGNOSIS — E1142 Type 2 diabetes mellitus with diabetic polyneuropathy: Secondary | ICD-10-CM

## 2021-07-11 DIAGNOSIS — M2141 Flat foot [pes planus] (acquired), right foot: Secondary | ICD-10-CM

## 2021-07-11 NOTE — Progress Notes (Signed)
SITUATION ?Reason for Consult: Evaluation for Prefabricated Diabetic Shoes and Custom Diabetic Inserts. ?Patient / Caregiver Report: Patient would like well fitting shoes ? ?OBJECTIVE DATA: ?Patient History / Diagnosis:  ?  ICD-10-CM   ?1. Diabetic polyneuropathy associated with type 2 diabetes mellitus ([place])  E11.42   ?  ?2. Pes planus of both feet  M21.41   ? M21.42   ?  ? ? ?Physician Treating Diabetes:  [facility] ? ?Current or Previous Devices:   Historical user ? ?In-Person Foot Examination: ?Ulcers & Callousing:   None and no history ?Deformities:    Pes planus ?Sensation:    Compromised  ?Shoe Size:     53M ? ?ORTHOTIC RECOMMENDATION ?Recommended Devices: ?- 1x pair prefabricated PDAC approved diabetic shoes; Patient Selected Apex G8010M Size 53M ?- 3x pair custom-to-patient PDAC approved vacuum formed diabetic insoles. ? ?GOALS OF SHOES AND INSOLES ?- Reduce shear and pressure ?- Reduce / Prevent callus formation ?- Reduce / Prevent ulceration ?- Protect the fragile healing compromised diabetic foot. ? ?Patient would benefit from diabetic shoes and inserts as patient has diabetes mellitus and the patient has one or more of the following conditions: ?- History of partial or complete amputation of the foot ?- History of previous foot ulceration. ?- History of pre-ulcerative callus ?- Peripheral neuropathy with evidence of callus formation ?- Foot deformity ?- Poor circulation ? ?ACTIONS PERFORMED ?Potential out of pocket cost was communicated to patient. Patient understood and consented to measurement and casting. Patient was casted for insoles via crush box and measured for shoes via brannock device. Procedure was explained and patient tolerated procedure well. All questions were answered and concerns addressed. Casts were shipped to central fabrication for HOLD until Certificate of Medical Necessity or otherwise necessary authorization from insurance is obtained. ? ?PLAN ?Shoes are to be ordered and  casts released from hold once all appropriate paperwork is complete. Patient is to be contacted and scheduled for fitting once shoes and insoles have been fabricated and received. ? ?

## 2021-08-01 ENCOUNTER — Encounter (INDEPENDENT_AMBULATORY_CARE_PROVIDER_SITE_OTHER): Payer: HMO | Admitting: Ophthalmology

## 2021-08-01 DIAGNOSIS — H35033 Hypertensive retinopathy, bilateral: Secondary | ICD-10-CM

## 2021-08-01 DIAGNOSIS — I10 Essential (primary) hypertension: Secondary | ICD-10-CM | POA: Diagnosis not present

## 2021-08-01 DIAGNOSIS — H43813 Vitreous degeneration, bilateral: Secondary | ICD-10-CM | POA: Diagnosis not present

## 2021-08-01 DIAGNOSIS — H353231 Exudative age-related macular degeneration, bilateral, with active choroidal neovascularization: Secondary | ICD-10-CM

## 2021-09-02 ENCOUNTER — Telehealth: Payer: Self-pay

## 2021-09-02 NOTE — Telephone Encounter (Signed)
CMN Received - Casts released from hold and Shoes Ordered  Apex G8010M 60M

## 2021-09-04 ENCOUNTER — Encounter: Payer: HMO | Admitting: Cardiology

## 2021-09-04 ENCOUNTER — Telehealth: Payer: Self-pay | Admitting: Podiatry

## 2021-09-04 NOTE — Telephone Encounter (Signed)
Patient called and lvm stating that the CMN needed to HTA

## 2021-09-12 ENCOUNTER — Encounter: Payer: Self-pay | Admitting: Podiatry

## 2021-09-12 ENCOUNTER — Ambulatory Visit (INDEPENDENT_AMBULATORY_CARE_PROVIDER_SITE_OTHER): Payer: HMO | Admitting: Podiatry

## 2021-09-12 DIAGNOSIS — K222 Esophageal obstruction: Secondary | ICD-10-CM | POA: Insufficient documentation

## 2021-09-12 DIAGNOSIS — M79676 Pain in unspecified toe(s): Secondary | ICD-10-CM | POA: Diagnosis not present

## 2021-09-12 DIAGNOSIS — R2681 Unsteadiness on feet: Secondary | ICD-10-CM | POA: Insufficient documentation

## 2021-09-12 DIAGNOSIS — B351 Tinea unguium: Secondary | ICD-10-CM

## 2021-09-12 DIAGNOSIS — M2141 Flat foot [pes planus] (acquired), right foot: Secondary | ICD-10-CM

## 2021-09-12 DIAGNOSIS — E1142 Type 2 diabetes mellitus with diabetic polyneuropathy: Secondary | ICD-10-CM | POA: Diagnosis not present

## 2021-09-12 DIAGNOSIS — K2101 Gastro-esophageal reflux disease with esophagitis, with bleeding: Secondary | ICD-10-CM | POA: Insufficient documentation

## 2021-09-12 DIAGNOSIS — M2142 Flat foot [pes planus] (acquired), left foot: Secondary | ICD-10-CM

## 2021-09-12 DIAGNOSIS — E43 Unspecified severe protein-calorie malnutrition: Secondary | ICD-10-CM | POA: Insufficient documentation

## 2021-09-12 DIAGNOSIS — B356 Tinea cruris: Secondary | ICD-10-CM | POA: Insufficient documentation

## 2021-09-12 DIAGNOSIS — R54 Age-related physical debility: Secondary | ICD-10-CM | POA: Insufficient documentation

## 2021-09-12 NOTE — Progress Notes (Signed)
This patient returns to my office for at risk foot care.  This patient requires this care by a professional since this patient will be at risk due to having diabetes with neuropathy.  This patient is unable to cut nails himself since the patient cannot reach his nails.These nails are painful walking and wearing shoes.  This patient presents for at risk foot care today.    General Appearance  Alert, conversant and in no acute stress.  Vascular  Dorsalis pedis and posterior tibial  pulses are palpable  bilaterally.  Capillary return is within normal limits  bilaterally. Temperature is within normal limits  bilaterally.  Neurologic  Senn-Weinstein monofilament wire test diminished/absent   bilaterally. Muscle power within normal limits bilaterally.  Nails Thick disfigured discolored nails with subungual debris  from hallux to fifth toes bilaterally. No evidence of bacterial infection or drainage bilaterally.  Orthopedic  No limitations of motion  feet .  No crepitus or effusions noted.  No bony pathology or digital deformities noted. HAV  B/L.  Skin  normotropic skin .   porokeratosis noted sub 5th met left foot resolved.  No signs of infections or ulcers noted.     Onychomycosis  Pain in right toes  Pain in left toes    Consent was obtained for treatment procedures.   Mechanical debridement of nails 1-5  bilaterally performed with a nail nipper.  Filed with dremel without incident. Patient is concerned about his letter received which denied his diabetic shoes.   Return office visit   10 weeks                   Told patient to return for periodic foot care and evaluation due to potential at risk complications.    Gardiner Barefoot DPM

## 2021-09-30 ENCOUNTER — Ambulatory Visit: Payer: HMO

## 2021-09-30 DIAGNOSIS — M2141 Flat foot [pes planus] (acquired), right foot: Secondary | ICD-10-CM

## 2021-09-30 DIAGNOSIS — E1142 Type 2 diabetes mellitus with diabetic polyneuropathy: Secondary | ICD-10-CM

## 2021-09-30 NOTE — Progress Notes (Signed)
SITUATION Reason for Visit: Fitting of Diabetic Shoes & Insoles Patient / Caregiver Report:  Patient is satisfied with fit and function of shoes and insoles.  OBJECTIVE DATA: Patient History / Diagnosis:     ICD-10-CM   1. Diabetic polyneuropathy associated with type 2 diabetes mellitus ([facility])  E11.42     2. Pes planus of both feet  M21.41    M21.42       Change in Status:   None  ACTIONS PERFORMED: In-Person Delivery, patient was fit with: - 1x pair [id number] PDAC approved prefabricated Diabetic Shoes: Apex G8010M 22M - 3x pair [id number] PDAC approved vacuum formed custom diabetic insoles; [employer]: [id number]  Shoes and insoles were verified for structural integrity and safety. Patient wore shoes and insoles in office. Skin was inspected and free of areas of concern after wearing shoes and inserts. Shoes and inserts fit properly. Patient / Caregiver provided with ferbal instruction and demonstration regarding donning, doffing, wear, care, proper fit, function, purpose, cleaning, and use of shoes and insoles ' and in all related precautions and risks and benefits regarding shoes and insoles. Patient / Caregiver was instructed to wear properly fitting socks with shoes at all times. Patient was also provided with verbal instruction regarding how to report any failures or malfunctions of shoes or inserts, and necessary follow up care. Patient / Caregiver was also instructed to contact physician regarding change in status that may affect function of shoes and inserts.   Patient / Caregiver verbalized undersatnding of instruction provided. Patient / Caregiver demonstrated independence with proper donning and doffing of shoes and inserts.  PLAN Patient to follow with treating physician as recommended. Plan of care was discussed with and agreed upon by patient and/or caregiver. All questions were answered and concerns addressed.

## 2021-10-10 ENCOUNTER — Encounter (INDEPENDENT_AMBULATORY_CARE_PROVIDER_SITE_OTHER): Payer: HMO | Admitting: Ophthalmology

## 2021-10-10 DIAGNOSIS — I10 Essential (primary) hypertension: Secondary | ICD-10-CM | POA: Diagnosis not present

## 2021-10-10 DIAGNOSIS — H353231 Exudative age-related macular degeneration, bilateral, with active choroidal neovascularization: Secondary | ICD-10-CM | POA: Diagnosis not present

## 2021-10-10 DIAGNOSIS — H43813 Vitreous degeneration, bilateral: Secondary | ICD-10-CM

## 2021-10-10 DIAGNOSIS — H35033 Hypertensive retinopathy, bilateral: Secondary | ICD-10-CM | POA: Diagnosis not present

## 2021-12-18 ENCOUNTER — Encounter (INDEPENDENT_AMBULATORY_CARE_PROVIDER_SITE_OTHER): Payer: HMO | Admitting: Ophthalmology

## 2021-12-18 DIAGNOSIS — I10 Essential (primary) hypertension: Secondary | ICD-10-CM

## 2021-12-18 DIAGNOSIS — H353231 Exudative age-related macular degeneration, bilateral, with active choroidal neovascularization: Secondary | ICD-10-CM

## 2021-12-18 DIAGNOSIS — H43813 Vitreous degeneration, bilateral: Secondary | ICD-10-CM | POA: Diagnosis not present

## 2021-12-18 DIAGNOSIS — H35033 Hypertensive retinopathy, bilateral: Secondary | ICD-10-CM | POA: Diagnosis not present

## 2021-12-19 ENCOUNTER — Encounter: Payer: Self-pay | Admitting: Podiatry

## 2021-12-19 ENCOUNTER — Ambulatory Visit: Payer: HMO | Admitting: Podiatry

## 2021-12-19 ENCOUNTER — Encounter (INDEPENDENT_AMBULATORY_CARE_PROVIDER_SITE_OTHER): Payer: HMO | Admitting: Ophthalmology

## 2021-12-19 DIAGNOSIS — B351 Tinea unguium: Secondary | ICD-10-CM | POA: Diagnosis not present

## 2021-12-19 DIAGNOSIS — M79676 Pain in unspecified toe(s): Secondary | ICD-10-CM

## 2021-12-19 DIAGNOSIS — E1142 Type 2 diabetes mellitus with diabetic polyneuropathy: Secondary | ICD-10-CM

## 2021-12-19 DIAGNOSIS — M2142 Flat foot [pes planus] (acquired), left foot: Secondary | ICD-10-CM

## 2021-12-19 DIAGNOSIS — M2141 Flat foot [pes planus] (acquired), right foot: Secondary | ICD-10-CM

## 2021-12-19 NOTE — Progress Notes (Signed)
This patient returns to my office for at risk foot care.  This patient requires this care by a professional since this patient will be at risk due to having diabetes with neuropathy.  This patient is unable to cut nails himself since the patient cannot reach his nails.These nails are painful walking and wearing shoes.  This patient presents for at risk foot care today.    General Appearance  Alert, conversant and in no acute stress.  Vascular  Dorsalis pedis and posterior tibial  pulses are palpable  bilaterally.  Capillary return is within normal limits  bilaterally. Temperature is within normal limits  bilaterally.  Neurologic  Senn-Weinstein monofilament wire test diminished/absent   bilaterally. Muscle power within normal limits bilaterally.  Nails Thick disfigured discolored nails with subungual debris  from hallux to fifth toes bilaterally. No evidence of bacterial infection or drainage bilaterally.  Orthopedic  No limitations of motion  feet .  No crepitus or effusions noted.  No bony pathology or digital deformities noted. HAV  B/L.  Skin  normotropic skin .   porokeratosis noted sub 5th met left foot resolved.  No signs of infections or ulcers noted.     Onychomycosis  Pain in right toes  Pain in left toes    Consent was obtained for treatment procedures.   Mechanical debridement of nails 1-5  bilaterally performed with a nail nipper.  Filed with dremel without incident.    Return office visit   10 weeks                   Told patient to return for periodic foot care and evaluation due to potential at risk complications.    Gardiner Barefoot DPM

## 2022-02-20 ENCOUNTER — Encounter: Payer: Self-pay | Admitting: Podiatry

## 2022-02-20 ENCOUNTER — Ambulatory Visit (INDEPENDENT_AMBULATORY_CARE_PROVIDER_SITE_OTHER): Payer: HMO | Admitting: Podiatry

## 2022-02-20 DIAGNOSIS — M2141 Flat foot [pes planus] (acquired), right foot: Secondary | ICD-10-CM

## 2022-02-20 DIAGNOSIS — E1142 Type 2 diabetes mellitus with diabetic polyneuropathy: Secondary | ICD-10-CM

## 2022-02-20 DIAGNOSIS — M2142 Flat foot [pes planus] (acquired), left foot: Secondary | ICD-10-CM

## 2022-02-20 DIAGNOSIS — B351 Tinea unguium: Secondary | ICD-10-CM

## 2022-02-20 DIAGNOSIS — M79676 Pain in unspecified toe(s): Secondary | ICD-10-CM

## 2022-02-20 NOTE — Progress Notes (Signed)
This patient returns to my office for at risk foot care.  This patient requires this care by a professional since this patient will be at risk due to having diabetes with neuropathy.  This patient is unable to cut nails himself since the patient cannot reach his nails.These nails are painful walking and wearing shoes.  This patient presents for at risk foot care today.    General Appearance  Alert, conversant and in no acute stress.  Vascular  Dorsalis pedis and posterior tibial  pulses are palpable  bilaterally.  Capillary return is within normal limits  bilaterally. Temperature is within normal limits  bilaterally.  Neurologic  Senn-Weinstein monofilament wire test diminished/absent   bilaterally. Muscle power within normal limits bilaterally.  Nails Thick disfigured discolored nails with subungual debris  from hallux to fifth toes bilaterally. No evidence of bacterial infection or drainage bilaterally.  Orthopedic  No limitations of motion  feet .  No crepitus or effusions noted.  No bony pathology or digital deformities noted. HAV  B/L.  Skin  normotropic skin .   porokeratosis noted sub 5th met left foot resolved.  No signs of infections or ulcers noted.     Onychomycosis  Pain in right toes  Pain in left toes    Consent was obtained for treatment procedures.   Mechanical debridement of nails 1-5  bilaterally performed with a nail nipper.  Filed with dremel without incident.    Return office visit   10 weeks                   Told patient to return for periodic foot care and evaluation due to potential at risk complications.    Gardiner Barefoot DPM

## 2022-02-27 ENCOUNTER — Encounter (INDEPENDENT_AMBULATORY_CARE_PROVIDER_SITE_OTHER): Payer: HMO | Admitting: Ophthalmology

## 2022-02-27 DIAGNOSIS — H43813 Vitreous degeneration, bilateral: Secondary | ICD-10-CM | POA: Diagnosis not present

## 2022-02-27 DIAGNOSIS — H353231 Exudative age-related macular degeneration, bilateral, with active choroidal neovascularization: Secondary | ICD-10-CM | POA: Diagnosis not present

## 2022-02-27 DIAGNOSIS — I10 Essential (primary) hypertension: Secondary | ICD-10-CM | POA: Diagnosis not present

## 2022-02-27 DIAGNOSIS — H35033 Hypertensive retinopathy, bilateral: Secondary | ICD-10-CM | POA: Diagnosis not present

## 2022-05-08 ENCOUNTER — Ambulatory Visit: Payer: HMO | Admitting: Podiatry

## 2022-05-08 ENCOUNTER — Encounter (INDEPENDENT_AMBULATORY_CARE_PROVIDER_SITE_OTHER): Payer: PPO | Admitting: Ophthalmology

## 2022-05-08 DIAGNOSIS — H353231 Exudative age-related macular degeneration, bilateral, with active choroidal neovascularization: Secondary | ICD-10-CM | POA: Diagnosis not present

## 2022-05-08 DIAGNOSIS — H35033 Hypertensive retinopathy, bilateral: Secondary | ICD-10-CM

## 2022-05-08 DIAGNOSIS — H43813 Vitreous degeneration, bilateral: Secondary | ICD-10-CM | POA: Diagnosis not present

## 2022-05-08 DIAGNOSIS — I10 Essential (primary) hypertension: Secondary | ICD-10-CM

## 2022-05-15 ENCOUNTER — Ambulatory Visit: Payer: HMO | Admitting: Podiatry

## 2022-06-05 ENCOUNTER — Ambulatory Visit: Payer: PPO | Admitting: Podiatry

## 2022-06-05 ENCOUNTER — Encounter: Payer: Self-pay | Admitting: Podiatry

## 2022-06-05 DIAGNOSIS — B351 Tinea unguium: Secondary | ICD-10-CM | POA: Diagnosis not present

## 2022-06-05 DIAGNOSIS — E1142 Type 2 diabetes mellitus with diabetic polyneuropathy: Secondary | ICD-10-CM | POA: Diagnosis not present

## 2022-06-05 DIAGNOSIS — M79676 Pain in unspecified toe(s): Secondary | ICD-10-CM | POA: Diagnosis not present

## 2022-06-05 NOTE — Progress Notes (Signed)
This patient returns to my office for at risk foot care.  This patient requires this care by a professional since this patient will be at risk due to having diabetes with neuropathy.  This patient is unable to cut nails himself since the patient cannot reach his nails.These nails are painful walking and wearing shoes.  This patient presents for at risk foot care today.    General Appearance  Alert, conversant and in no acute stress.  Vascular  Dorsalis pedis and posterior tibial  pulses are palpable  bilaterally.  Capillary return is within normal limits  bilaterally. Temperature is within normal limits  bilaterally.  Neurologic  Senn-Weinstein monofilament wire test diminished/absent   bilaterally. Muscle power within normal limits bilaterally.  Nails Thick disfigured discolored nails with subungual debris  from hallux to fifth toes bilaterally. No evidence of bacterial infection or drainage bilaterally.  Orthopedic  No limitations of motion  feet .  No crepitus or effusions noted.  No bony pathology or digital deformities noted. HAV  B/L.  Skin  normotropic skin .   porokeratosis noted sub 5th met left foot resolved.  No signs of infections or ulcers noted.     Onychomycosis  Pain in right toes  Pain in left toes    Consent was obtained for treatment procedures.   Mechanical debridement of nails 1-5  bilaterally performed with a nail nipper.  Filed with dremel without incident.    Return office visit   10 weeks                   Told patient to return for periodic foot care and evaluation due to potential at risk complications.    Slade Pierpoint DPM  

## 2022-07-17 ENCOUNTER — Ambulatory Visit: Payer: HMO | Admitting: Podiatry

## 2022-07-17 ENCOUNTER — Encounter (INDEPENDENT_AMBULATORY_CARE_PROVIDER_SITE_OTHER): Payer: PPO | Admitting: Ophthalmology

## 2022-07-17 DIAGNOSIS — H43813 Vitreous degeneration, bilateral: Secondary | ICD-10-CM | POA: Diagnosis not present

## 2022-07-17 DIAGNOSIS — I10 Essential (primary) hypertension: Secondary | ICD-10-CM

## 2022-07-17 DIAGNOSIS — H35033 Hypertensive retinopathy, bilateral: Secondary | ICD-10-CM

## 2022-07-17 DIAGNOSIS — H353231 Exudative age-related macular degeneration, bilateral, with active choroidal neovascularization: Secondary | ICD-10-CM

## 2022-07-24 ENCOUNTER — Ambulatory Visit (INDEPENDENT_AMBULATORY_CARE_PROVIDER_SITE_OTHER): Payer: PPO | Admitting: Podiatry

## 2022-07-24 ENCOUNTER — Encounter: Payer: Self-pay | Admitting: Podiatry

## 2022-07-24 DIAGNOSIS — E1142 Type 2 diabetes mellitus with diabetic polyneuropathy: Secondary | ICD-10-CM

## 2022-07-24 DIAGNOSIS — M79676 Pain in unspecified toe(s): Secondary | ICD-10-CM | POA: Diagnosis not present

## 2022-07-24 DIAGNOSIS — B351 Tinea unguium: Secondary | ICD-10-CM

## 2022-07-24 NOTE — Progress Notes (Signed)
This patient returns to my office for at risk foot care.  This patient requires this care by a professional since this patient will be at risk due to having diabetes with neuropathy.  This patient is unable to cut nails himself since the patient cannot reach his nails.These nails are painful walking and wearing shoes.  This patient presents for at risk foot care today.    General Appearance  Alert, conversant and in no acute stress.  Vascular  Dorsalis pedis and posterior tibial  pulses are palpable  bilaterally.  Capillary return is within normal limits  bilaterally. Temperature is within normal limits  bilaterally.  Neurologic  Senn-Weinstein monofilament wire test diminished/absent   bilaterally. Muscle power within normal limits bilaterally.  Nails Thick disfigured discolored nails with subungual debris  from hallux to fifth toes bilaterally. No evidence of bacterial infection or drainage bilaterally.  Orthopedic  No limitations of motion  feet .  No crepitus or effusions noted.  No bony pathology or digital deformities noted. HAV  B/L.  Skin  normotropic skin .   porokeratosis noted sub 5th met left foot resolved.  No signs of infections or ulcers noted.     Onychomycosis  Pain in right toes  Pain in left toes    Consent was obtained for treatment procedures.   Mechanical debridement of nails 1-5  bilaterally performed with a nail nipper.  Filed with dremel without incident.    Return office visit   10 weeks                   Told patient to return for periodic foot care and evaluation due to potential at risk complications.    Franceen Erisman DPM  

## 2022-09-25 ENCOUNTER — Encounter (INDEPENDENT_AMBULATORY_CARE_PROVIDER_SITE_OTHER): Payer: PPO | Admitting: Ophthalmology

## 2022-09-25 DIAGNOSIS — I10 Essential (primary) hypertension: Secondary | ICD-10-CM

## 2022-09-25 DIAGNOSIS — H35033 Hypertensive retinopathy, bilateral: Secondary | ICD-10-CM

## 2022-09-25 DIAGNOSIS — H43813 Vitreous degeneration, bilateral: Secondary | ICD-10-CM | POA: Diagnosis not present

## 2022-09-25 DIAGNOSIS — H353231 Exudative age-related macular degeneration, bilateral, with active choroidal neovascularization: Secondary | ICD-10-CM

## 2022-10-02 ENCOUNTER — Encounter: Payer: Self-pay | Admitting: Podiatry

## 2022-10-02 ENCOUNTER — Ambulatory Visit (INDEPENDENT_AMBULATORY_CARE_PROVIDER_SITE_OTHER): Payer: PPO | Admitting: Podiatry

## 2022-10-02 DIAGNOSIS — M79676 Pain in unspecified toe(s): Secondary | ICD-10-CM | POA: Diagnosis not present

## 2022-10-02 DIAGNOSIS — E1142 Type 2 diabetes mellitus with diabetic polyneuropathy: Secondary | ICD-10-CM | POA: Diagnosis not present

## 2022-10-02 DIAGNOSIS — B351 Tinea unguium: Secondary | ICD-10-CM

## 2022-10-02 NOTE — Progress Notes (Signed)
This patient returns to my office for at risk foot care.  This patient requires this care by a professional since this patient will be at risk due to having diabetes with neuropathy.  This patient is unable to cut nails himself since the patient cannot reach his nails.These nails are painful walking and wearing shoes.  This patient presents for at risk foot care today.    General Appearance  Alert, conversant and in no acute stress.  Vascular  Dorsalis pedis and posterior tibial  pulses are palpable  bilaterally.  Capillary return is within normal limits  bilaterally. Temperature is within normal limits  bilaterally.  Neurologic  Senn-Weinstein monofilament wire test diminished/absent   bilaterally. Muscle power within normal limits bilaterally.  Nails Thick disfigured discolored nails with subungual debris  from hallux to fifth toes bilaterally. No evidence of bacterial infection or drainage bilaterally.  Orthopedic  No limitations of motion  feet .  No crepitus or effusions noted.  No bony pathology or digital deformities noted. HAV  B/L.  Skin  normotropic skin .   porokeratosis noted sub 5th met left foot resolved.  No signs of infections or ulcers noted.     Onychomycosis  Pain in right toes  Pain in left toes    Consent was obtained for treatment procedures.   Mechanical debridement of nails 1-5  bilaterally performed with a nail nipper.  Filed with dremel without incident.    Return office visit   10 weeks                   Told patient to return for periodic foot care and evaluation due to potential at risk complications.    Trenee Igoe DPM  

## 2022-12-04 ENCOUNTER — Encounter (INDEPENDENT_AMBULATORY_CARE_PROVIDER_SITE_OTHER): Payer: PPO | Admitting: Ophthalmology

## 2022-12-04 DIAGNOSIS — H43813 Vitreous degeneration, bilateral: Secondary | ICD-10-CM | POA: Diagnosis not present

## 2022-12-04 DIAGNOSIS — I10 Essential (primary) hypertension: Secondary | ICD-10-CM

## 2022-12-04 DIAGNOSIS — H353231 Exudative age-related macular degeneration, bilateral, with active choroidal neovascularization: Secondary | ICD-10-CM | POA: Diagnosis not present

## 2022-12-04 DIAGNOSIS — H35033 Hypertensive retinopathy, bilateral: Secondary | ICD-10-CM | POA: Diagnosis not present

## 2022-12-11 ENCOUNTER — Encounter: Payer: Self-pay | Admitting: Podiatry

## 2022-12-11 ENCOUNTER — Ambulatory Visit (INDEPENDENT_AMBULATORY_CARE_PROVIDER_SITE_OTHER): Payer: PPO | Admitting: Podiatry

## 2022-12-11 DIAGNOSIS — I739 Peripheral vascular disease, unspecified: Secondary | ICD-10-CM

## 2022-12-11 DIAGNOSIS — E1142 Type 2 diabetes mellitus with diabetic polyneuropathy: Secondary | ICD-10-CM | POA: Diagnosis not present

## 2022-12-11 DIAGNOSIS — B351 Tinea unguium: Secondary | ICD-10-CM | POA: Diagnosis not present

## 2022-12-11 DIAGNOSIS — M79676 Pain in unspecified toe(s): Secondary | ICD-10-CM | POA: Diagnosis not present

## 2022-12-11 NOTE — Progress Notes (Signed)
This patient returns to my office for at risk foot care.  This patient requires this care by a professional since this patient will be at risk due to having diabetes with neuropathy.  This patient is unable to cut nails himself since the patient cannot reach his nails.These nails are painful walking and wearing shoes.  This patient presents for at risk foot care today.    General Appearance  Alert, conversant and in no acute stress.  Vascular  Dorsalis pedis and posterior tibial  pulses are palpable  bilaterally.  Capillary return is within normal limits  bilaterally. Temperature is within normal limits  bilaterally.  Neurologic  Senn-Weinstein monofilament wire test diminished/absent   bilaterally. Muscle power within normal limits bilaterally.  Nails Thick disfigured discolored nails with subungual debris  from hallux to fifth toes bilaterally. No evidence of bacterial infection or drainage bilaterally.  Orthopedic  No limitations of motion  feet .  No crepitus or effusions noted.  No bony pathology or digital deformities noted. HAV  B/L.  Skin  normotropic skin .   porokeratosis noted sub 5th met left foot resolved.  No signs of infections or ulcers noted.     Onychomycosis  Pain in right toes  Pain in left toes    Consent was obtained for treatment procedures.   Mechanical debridement of nails 1-5  bilaterally performed with a nail nipper.  Filed with dremel without incident.  Patient says he has severe cramping back of right leg at bedtime.  Send over for vascular studies.     Return office visit   10 weeks                   Told patient to return for periodic foot care and evaluation due to potential at risk complications.    Helane Gunther DPM

## 2022-12-17 ENCOUNTER — Ambulatory Visit (HOSPITAL_COMMUNITY)
Admission: RE | Admit: 2022-12-17 | Discharge: 2022-12-17 | Disposition: A | Discharge status: Home / Self Care | Payer: PPO | Source: Ambulatory Visit | Attending: Podiatry | Admitting: Podiatry

## 2022-12-17 DIAGNOSIS — I739 Peripheral vascular disease, unspecified: Secondary | ICD-10-CM | POA: Insufficient documentation

## 2022-12-30 ENCOUNTER — Telehealth: Payer: Self-pay | Admitting: Podiatry

## 2022-12-30 NOTE — Telephone Encounter (Signed)
He is trying to get a copy of his medical records

## 2023-02-12 ENCOUNTER — Encounter (INDEPENDENT_AMBULATORY_CARE_PROVIDER_SITE_OTHER): Payer: PPO | Admitting: Ophthalmology

## 2023-02-12 DIAGNOSIS — H35033 Hypertensive retinopathy, bilateral: Secondary | ICD-10-CM | POA: Diagnosis not present

## 2023-02-12 DIAGNOSIS — I10 Essential (primary) hypertension: Secondary | ICD-10-CM | POA: Diagnosis not present

## 2023-02-12 DIAGNOSIS — H43813 Vitreous degeneration, bilateral: Secondary | ICD-10-CM | POA: Diagnosis not present

## 2023-02-12 DIAGNOSIS — H353231 Exudative age-related macular degeneration, bilateral, with active choroidal neovascularization: Secondary | ICD-10-CM | POA: Diagnosis not present

## 2023-02-19 ENCOUNTER — Ambulatory Visit (INDEPENDENT_AMBULATORY_CARE_PROVIDER_SITE_OTHER): Payer: PPO | Admitting: Podiatry

## 2023-02-19 ENCOUNTER — Ambulatory Visit: Payer: PPO | Admitting: Podiatry

## 2023-02-19 ENCOUNTER — Encounter: Payer: Self-pay | Admitting: Podiatry

## 2023-02-19 VITALS — Ht 70.5 in | Wt 165.0 lb

## 2023-02-19 DIAGNOSIS — B351 Tinea unguium: Secondary | ICD-10-CM | POA: Diagnosis not present

## 2023-02-19 DIAGNOSIS — M79676 Pain in unspecified toe(s): Secondary | ICD-10-CM

## 2023-02-19 DIAGNOSIS — E1142 Type 2 diabetes mellitus with diabetic polyneuropathy: Secondary | ICD-10-CM | POA: Diagnosis not present

## 2023-02-19 NOTE — Progress Notes (Signed)
This patient returns to my office for at risk foot care.  This patient requires this care by a professional since this patient will be at risk due to having diabetes with neuropathy.  This patient is unable to cut nails himself since the patient cannot reach his nails.These nails are painful walking and wearing shoes.  This patient presents for at risk foot care today.    General Appearance  Alert, conversant and in no acute stress.  Vascular  Dorsalis pedis and posterior tibial  pulses are palpable  bilaterally.  Capillary return is within normal limits  bilaterally. Temperature is within normal limits  bilaterally.  Neurologic  Senn-Weinstein monofilament wire test diminished/absent   bilaterally. Muscle power within normal limits bilaterally.  Nails Thick disfigured discolored nails with subungual debris  from hallux to fifth toes bilaterally. No evidence of bacterial infection or drainage bilaterally.  Orthopedic  No limitations of motion  feet .  No crepitus or effusions noted.  No bony pathology or digital deformities noted. HAV  B/L.  Skin  normotropic skin .   porokeratosis noted sub 5th met left foot resolved.  No signs of infections or ulcers noted.     Onychomycosis  Pain in right toes  Pain in left toes    Consent was obtained for treatment procedures.   Mechanical debridement of nails 1-5  bilaterally performed with a nail nipper.  Filed with dremel without incident.    Return office visit   10 weeks                   Told patient to return for periodic foot care and evaluation due to potential at risk complications.    Slade Pierpoint DPM  

## 2023-03-26 ENCOUNTER — Encounter (INDEPENDENT_AMBULATORY_CARE_PROVIDER_SITE_OTHER): Payer: PPO | Admitting: Ophthalmology

## 2023-03-26 DIAGNOSIS — H43813 Vitreous degeneration, bilateral: Secondary | ICD-10-CM | POA: Diagnosis not present

## 2023-03-26 DIAGNOSIS — I10 Essential (primary) hypertension: Secondary | ICD-10-CM | POA: Diagnosis not present

## 2023-03-26 DIAGNOSIS — H35033 Hypertensive retinopathy, bilateral: Secondary | ICD-10-CM | POA: Diagnosis not present

## 2023-03-26 DIAGNOSIS — H353231 Exudative age-related macular degeneration, bilateral, with active choroidal neovascularization: Secondary | ICD-10-CM | POA: Diagnosis not present

## 2023-04-30 ENCOUNTER — Ambulatory Visit: Payer: PPO | Admitting: Podiatry

## 2023-04-30 ENCOUNTER — Encounter (INDEPENDENT_AMBULATORY_CARE_PROVIDER_SITE_OTHER): Payer: PPO | Admitting: Ophthalmology

## 2023-04-30 DIAGNOSIS — H353231 Exudative age-related macular degeneration, bilateral, with active choroidal neovascularization: Secondary | ICD-10-CM

## 2023-04-30 DIAGNOSIS — H43813 Vitreous degeneration, bilateral: Secondary | ICD-10-CM | POA: Diagnosis not present

## 2023-04-30 DIAGNOSIS — I10 Essential (primary) hypertension: Secondary | ICD-10-CM

## 2023-04-30 DIAGNOSIS — H35033 Hypertensive retinopathy, bilateral: Secondary | ICD-10-CM | POA: Diagnosis not present

## 2023-05-07 ENCOUNTER — Ambulatory Visit (INDEPENDENT_AMBULATORY_CARE_PROVIDER_SITE_OTHER): Payer: PPO | Admitting: Podiatry

## 2023-05-07 ENCOUNTER — Encounter: Payer: Self-pay | Admitting: Podiatry

## 2023-05-07 DIAGNOSIS — B351 Tinea unguium: Secondary | ICD-10-CM | POA: Diagnosis not present

## 2023-05-07 DIAGNOSIS — E1142 Type 2 diabetes mellitus with diabetic polyneuropathy: Secondary | ICD-10-CM | POA: Diagnosis not present

## 2023-05-07 DIAGNOSIS — M79676 Pain in unspecified toe(s): Secondary | ICD-10-CM | POA: Diagnosis not present

## 2023-05-07 NOTE — Progress Notes (Signed)
This patient returns to my office for at risk foot care.  This patient requires this care by a professional since this patient will be at risk due to having diabetes with neuropathy.  This patient is unable to cut nails himself since the patient cannot reach his nails.These nails are painful walking and wearing shoes.  This patient presents for at risk foot care today.    General Appearance  Alert, conversant and in no acute stress.  Vascular  Dorsalis pedis and posterior tibial  pulses are palpable  bilaterally.  Capillary return is within normal limits  bilaterally. Temperature is within normal limits  bilaterally.  Neurologic  Senn-Weinstein monofilament wire test diminished/absent   bilaterally. Muscle power within normal limits bilaterally.  Nails Thick disfigured discolored nails with subungual debris  from hallux to fifth toes bilaterally. No evidence of bacterial infection or drainage bilaterally.  Orthopedic  No limitations of motion  feet .  No crepitus or effusions noted.  No bony pathology or digital deformities noted. HAV  B/L.  Skin  normotropic skin .   porokeratosis noted sub 5th met left foot resolved.  No signs of infections or ulcers noted.     Onychomycosis  Pain in right toes  Pain in left toes    Consent was obtained for treatment procedures.   Mechanical debridement of nails 1-5  bilaterally performed with a nail nipper.  Filed with dremel without incident.    Return office visit   10 weeks                   Told patient to return for periodic foot care and evaluation due to potential at risk complications.    Helane Gunther DPM

## 2023-06-11 ENCOUNTER — Encounter (INDEPENDENT_AMBULATORY_CARE_PROVIDER_SITE_OTHER): Payer: PPO | Admitting: Ophthalmology

## 2023-06-11 DIAGNOSIS — H35033 Hypertensive retinopathy, bilateral: Secondary | ICD-10-CM

## 2023-06-11 DIAGNOSIS — I10 Essential (primary) hypertension: Secondary | ICD-10-CM | POA: Diagnosis not present

## 2023-06-11 DIAGNOSIS — H353231 Exudative age-related macular degeneration, bilateral, with active choroidal neovascularization: Secondary | ICD-10-CM

## 2023-06-11 DIAGNOSIS — H43813 Vitreous degeneration, bilateral: Secondary | ICD-10-CM | POA: Diagnosis not present

## 2023-07-16 ENCOUNTER — Encounter: Payer: Self-pay | Admitting: Podiatry

## 2023-07-16 ENCOUNTER — Ambulatory Visit: Payer: PPO | Admitting: Podiatry

## 2023-07-16 ENCOUNTER — Telehealth: Payer: Self-pay | Admitting: Podiatry

## 2023-07-16 DIAGNOSIS — E1142 Type 2 diabetes mellitus with diabetic polyneuropathy: Secondary | ICD-10-CM | POA: Diagnosis not present

## 2023-07-16 DIAGNOSIS — B351 Tinea unguium: Secondary | ICD-10-CM

## 2023-07-16 DIAGNOSIS — M2142 Flat foot [pes planus] (acquired), left foot: Secondary | ICD-10-CM

## 2023-07-16 DIAGNOSIS — M2141 Flat foot [pes planus] (acquired), right foot: Secondary | ICD-10-CM | POA: Diagnosis not present

## 2023-07-16 DIAGNOSIS — M79676 Pain in unspecified toe(s): Secondary | ICD-10-CM | POA: Diagnosis not present

## 2023-07-16 NOTE — Telephone Encounter (Signed)
 Pt very frustrated for that time spent on the phone trying to resolve the following matter:   He was transferred to supervisor, phone just rang and rang, then called drop without going into a voicemail. Called back twice, pressed option for billing and did not get anyone. I'm third person he spoke to when he pressed for operator. He has been on the phone for over hour and half.  The guy on the front desk (FD) was very persistent after Pt told him 4-5 times that he has been coming here for years for the same services [facility]) and has never paid a co-pay. The guy on FD told him if he doesn't pay co-pay he will not be seeing. Pt paid the $15.00. He called his insurance company when he got home, and they also said he do not have a co-pay for podiatry.   When looking at the copy of the insurance card on file: Cardiology/Podiatry/Endocrinology: $0 Specialist: $15  Explained to Pt that we will need to get clarification from supervisor on how to handle a co - pay correctly when insurance cards are like this.  Customer wants a call back from the supervisor and a refund for the co-pay.

## 2023-07-16 NOTE — Progress Notes (Signed)
 This patient returns to my office for at risk foot care.  This patient requires this care by a professional since this patient will be at risk due to having diabetes with neuropathy.  This patient is unable to cut nails himself since the patient cannot reach his nails.These nails are painful walking and wearing shoes.  This patient presents for at risk foot care today.    General Appearance  Alert, conversant and in no acute stress.  Vascular  Dorsalis pedis and posterior tibial  pulses are palpable  bilaterally.  Capillary return is within normal limits  bilaterally. Temperature is within normal limits  bilaterally.  Neurologic  Senn-Weinstein monofilament wire test diminished/absent   bilaterally. Muscle power within normal limits bilaterally.  Nails Thick disfigured discolored nails with subungual debris  from hallux to fifth toes bilaterally. No evidence of bacterial infection or drainage bilaterally.  Orthopedic  No limitations of motion  feet .  No crepitus or effusions noted.  No bony pathology or digital deformities noted. HAV  B/L.  Skin  normotropic skin .   porokeratosis noted sub 5th met left foot resolved.  No signs of infections or ulcers noted.     Onychomycosis  Pain in right toes  Pain in left toes    Consent was obtained for treatment procedures.   Mechanical debridement of nails 1-5  bilaterally performed with a nail nipper.  Filed with dremel without incident.    Return office visit   10 weeks                   Told patient to return for periodic foot care and evaluation due to potential at risk complications.    Helane Gunther DPM

## 2023-07-23 ENCOUNTER — Encounter (INDEPENDENT_AMBULATORY_CARE_PROVIDER_SITE_OTHER): Payer: PPO | Admitting: Ophthalmology

## 2023-07-23 DIAGNOSIS — H35033 Hypertensive retinopathy, bilateral: Secondary | ICD-10-CM | POA: Diagnosis not present

## 2023-07-23 DIAGNOSIS — H43813 Vitreous degeneration, bilateral: Secondary | ICD-10-CM

## 2023-07-23 DIAGNOSIS — H353231 Exudative age-related macular degeneration, bilateral, with active choroidal neovascularization: Secondary | ICD-10-CM | POA: Diagnosis not present

## 2023-07-23 DIAGNOSIS — I10 Essential (primary) hypertension: Secondary | ICD-10-CM | POA: Diagnosis not present

## 2023-08-27 ENCOUNTER — Encounter (INDEPENDENT_AMBULATORY_CARE_PROVIDER_SITE_OTHER): Admitting: Ophthalmology

## 2023-08-27 DIAGNOSIS — H35033 Hypertensive retinopathy, bilateral: Secondary | ICD-10-CM | POA: Diagnosis not present

## 2023-08-27 DIAGNOSIS — I10 Essential (primary) hypertension: Secondary | ICD-10-CM | POA: Diagnosis not present

## 2023-08-27 DIAGNOSIS — H43813 Vitreous degeneration, bilateral: Secondary | ICD-10-CM | POA: Diagnosis not present

## 2023-08-27 DIAGNOSIS — H353231 Exudative age-related macular degeneration, bilateral, with active choroidal neovascularization: Secondary | ICD-10-CM

## 2023-09-01 ENCOUNTER — Telehealth: Payer: Self-pay | Admitting: Podiatry

## 2023-09-01 NOTE — Telephone Encounter (Signed)
 Called to cancel DSM appt// pt would like referral to [facility].

## 2023-09-23 ENCOUNTER — Other Ambulatory Visit

## 2023-09-24 ENCOUNTER — Ambulatory Visit (INDEPENDENT_AMBULATORY_CARE_PROVIDER_SITE_OTHER): Admitting: Podiatry

## 2023-09-24 ENCOUNTER — Encounter: Payer: Self-pay | Admitting: Podiatry

## 2023-09-24 DIAGNOSIS — E1142 Type 2 diabetes mellitus with diabetic polyneuropathy: Secondary | ICD-10-CM

## 2023-09-24 DIAGNOSIS — M79676 Pain in unspecified toe(s): Secondary | ICD-10-CM | POA: Diagnosis not present

## 2023-09-24 DIAGNOSIS — B351 Tinea unguium: Secondary | ICD-10-CM | POA: Diagnosis not present

## 2023-09-24 NOTE — Progress Notes (Signed)
 This patient returns to my office for at risk foot care.  This patient requires this care by a professional since this patient will be at risk due to having diabetes with neuropathy.  This patient is unable to cut nails himself since the patient cannot reach his nails.These nails are painful walking and wearing shoes.  This patient presents for at risk foot care today.    General Appearance  Alert, conversant and in no acute stress.  Vascular  Dorsalis pedis and posterior tibial  pulses are palpable  bilaterally.  Capillary return is within normal limits  bilaterally. Temperature is within normal limits  bilaterally.  Neurologic  Senn-Weinstein monofilament wire test diminished/absent   bilaterally. Muscle power within normal limits bilaterally.  Nails Thick disfigured discolored nails with subungual debris  from hallux to fifth toes bilaterally. No evidence of bacterial infection or drainage bilaterally.  Orthopedic  No limitations of motion  feet .  No crepitus or effusions noted.  No bony pathology or digital deformities noted. HAV  B/L.  Skin  normotropic skin .   porokeratosis noted sub 5th met left foot resolved.  No signs of infections or ulcers noted.     Onychomycosis  Pain in right toes  Pain in left toes    Consent was obtained for treatment procedures.   Mechanical debridement of nails 1-5  bilaterally performed with a nail nipper.  Filed with dremel without incident.    Return office visit   10 weeks                   Told patient to return for periodic foot care and evaluation due to potential at risk complications.    Helane Gunther DPM

## 2023-10-08 ENCOUNTER — Encounter (INDEPENDENT_AMBULATORY_CARE_PROVIDER_SITE_OTHER): Admitting: Ophthalmology

## 2023-10-08 DIAGNOSIS — H353231 Exudative age-related macular degeneration, bilateral, with active choroidal neovascularization: Secondary | ICD-10-CM

## 2023-10-08 DIAGNOSIS — H35033 Hypertensive retinopathy, bilateral: Secondary | ICD-10-CM | POA: Diagnosis not present

## 2023-10-08 DIAGNOSIS — I10 Essential (primary) hypertension: Secondary | ICD-10-CM

## 2023-10-08 DIAGNOSIS — H43813 Vitreous degeneration, bilateral: Secondary | ICD-10-CM

## 2023-11-17 ENCOUNTER — Telehealth: Payer: Self-pay

## 2023-11-17 NOTE — Telephone Encounter (Signed)
 [facility] PA rcvd and approved  Auth# [id number]  11/08/23-02/06/24  Indexed in media// faxing to [facility]

## 2023-11-19 ENCOUNTER — Encounter (INDEPENDENT_AMBULATORY_CARE_PROVIDER_SITE_OTHER): Admitting: Ophthalmology

## 2023-11-19 DIAGNOSIS — I10 Essential (primary) hypertension: Secondary | ICD-10-CM | POA: Diagnosis not present

## 2023-11-19 DIAGNOSIS — H35033 Hypertensive retinopathy, bilateral: Secondary | ICD-10-CM

## 2023-11-19 DIAGNOSIS — H43813 Vitreous degeneration, bilateral: Secondary | ICD-10-CM | POA: Diagnosis not present

## 2023-11-19 DIAGNOSIS — H353231 Exudative age-related macular degeneration, bilateral, with active choroidal neovascularization: Secondary | ICD-10-CM

## 2023-12-10 ENCOUNTER — Ambulatory Visit (INDEPENDENT_AMBULATORY_CARE_PROVIDER_SITE_OTHER): Admitting: Podiatry

## 2023-12-10 ENCOUNTER — Encounter: Payer: Self-pay | Admitting: Podiatry

## 2023-12-10 DIAGNOSIS — B351 Tinea unguium: Secondary | ICD-10-CM | POA: Diagnosis not present

## 2023-12-10 DIAGNOSIS — M79676 Pain in unspecified toe(s): Secondary | ICD-10-CM | POA: Diagnosis not present

## 2023-12-10 DIAGNOSIS — E1142 Type 2 diabetes mellitus with diabetic polyneuropathy: Secondary | ICD-10-CM

## 2023-12-10 NOTE — Progress Notes (Signed)
 This patient returns to my office for at risk foot care.  This patient requires this care by a professional since this patient will be at risk due to having diabetes with neuropathy.  This patient is unable to cut nails himself since the patient cannot reach his nails.These nails are painful walking and wearing shoes.  This patient presents for at risk foot care today.    General Appearance  Alert, conversant and in no acute stress.  Vascular  Dorsalis pedis and posterior tibial  pulses are palpable  bilaterally.  Capillary return is within normal limits  bilaterally. Temperature is within normal limits  bilaterally.  Neurologic  Senn-Weinstein monofilament wire test diminished/absent   bilaterally. Muscle power within normal limits bilaterally.  Nails Thick disfigured discolored nails with subungual debris  from hallux to fifth toes bilaterally. No evidence of bacterial infection or drainage bilaterally.  Orthopedic  No limitations of motion  feet .  No crepitus or effusions noted.  No bony pathology or digital deformities noted. HAV  B/L.  Skin  normotropic skin .   porokeratosis noted sub 5th met left foot resolved.  No signs of infections or ulcers noted.     Onychomycosis  Pain in right toes  Pain in left toes    Consent was obtained for treatment procedures.   Mechanical debridement of nails 1-5  bilaterally performed with a nail nipper.  Filed with dremel without incident.    Return office visit   10 weeks                   Told patient to return for periodic foot care and evaluation due to potential at risk complications.    Helane Gunther DPM

## 2023-12-31 ENCOUNTER — Encounter (INDEPENDENT_AMBULATORY_CARE_PROVIDER_SITE_OTHER): Admitting: Ophthalmology

## 2023-12-31 DIAGNOSIS — I10 Essential (primary) hypertension: Secondary | ICD-10-CM

## 2023-12-31 DIAGNOSIS — H43813 Vitreous degeneration, bilateral: Secondary | ICD-10-CM | POA: Diagnosis not present

## 2023-12-31 DIAGNOSIS — H35033 Hypertensive retinopathy, bilateral: Secondary | ICD-10-CM | POA: Diagnosis not present

## 2023-12-31 DIAGNOSIS — H353231 Exudative age-related macular degeneration, bilateral, with active choroidal neovascularization: Secondary | ICD-10-CM | POA: Diagnosis not present

## 2024-02-11 ENCOUNTER — Encounter (INDEPENDENT_AMBULATORY_CARE_PROVIDER_SITE_OTHER): Admitting: Ophthalmology

## 2024-02-11 DIAGNOSIS — H43813 Vitreous degeneration, bilateral: Secondary | ICD-10-CM

## 2024-02-11 DIAGNOSIS — H35033 Hypertensive retinopathy, bilateral: Secondary | ICD-10-CM | POA: Diagnosis not present

## 2024-02-11 DIAGNOSIS — I10 Essential (primary) hypertension: Secondary | ICD-10-CM

## 2024-02-11 DIAGNOSIS — H353231 Exudative age-related macular degeneration, bilateral, with active choroidal neovascularization: Secondary | ICD-10-CM | POA: Diagnosis not present

## 2024-03-17 ENCOUNTER — Ambulatory Visit: Admitting: Podiatry

## 2024-03-24 ENCOUNTER — Encounter (INDEPENDENT_AMBULATORY_CARE_PROVIDER_SITE_OTHER): Admitting: Ophthalmology

## 2024-03-24 DIAGNOSIS — I10 Essential (primary) hypertension: Secondary | ICD-10-CM

## 2024-03-24 DIAGNOSIS — H353231 Exudative age-related macular degeneration, bilateral, with active choroidal neovascularization: Secondary | ICD-10-CM | POA: Diagnosis not present

## 2024-03-24 DIAGNOSIS — H43813 Vitreous degeneration, bilateral: Secondary | ICD-10-CM

## 2024-03-24 DIAGNOSIS — H35033 Hypertensive retinopathy, bilateral: Secondary | ICD-10-CM

## 2024-04-21 ENCOUNTER — Encounter: Payer: Self-pay | Admitting: Podiatry

## 2024-04-21 ENCOUNTER — Ambulatory Visit: Admitting: Podiatry

## 2024-04-21 DIAGNOSIS — M79675 Pain in left toe(s): Secondary | ICD-10-CM

## 2024-04-21 DIAGNOSIS — E1142 Type 2 diabetes mellitus with diabetic polyneuropathy: Secondary | ICD-10-CM

## 2024-04-21 DIAGNOSIS — M79674 Pain in right toe(s): Secondary | ICD-10-CM | POA: Diagnosis not present

## 2024-04-21 DIAGNOSIS — B351 Tinea unguium: Secondary | ICD-10-CM

## 2024-04-21 NOTE — Progress Notes (Signed)
 This patient returns to my office for at risk foot care.  This patient requires this care by a professional since this patient will be at risk due to having diabetes with neuropathy.  This patient is unable to cut nails himself since the patient cannot reach his nails.These nails are painful walking and wearing shoes.  This patient presents for at risk foot care today.    General Appearance  Alert, conversant and in no acute stress.  Vascular  Dorsalis pedis and posterior tibial  pulses are palpable  bilaterally.  Capillary return is within normal limits  bilaterally. Temperature is within normal limits  bilaterally.  Neurologic  Senn-Weinstein monofilament wire test diminished/absent   bilaterally. Muscle power within normal limits bilaterally.  Nails Thick disfigured discolored nails with subungual debris  from hallux to fifth toes bilaterally. No evidence of bacterial infection or drainage bilaterally.  Orthopedic  No limitations of motion  feet .  No crepitus or effusions noted.  No bony pathology or digital deformities noted. HAV  B/L.  Skin  normotropic skin .   porokeratosis noted sub 5th met left foot resolved.  No signs of infections or ulcers noted.     Onychomycosis  Pain in right toes  Pain in left toes    Consent was obtained for treatment procedures.   Mechanical debridement of nails 1-5  bilaterally performed with a nail nipper.  Filed with dremel without incident.    Return office visit   10 weeks                   Told patient to return for periodic foot care and evaluation due to potential at risk complications.    Helane Gunther DPM

## 2024-04-28 ENCOUNTER — Encounter (INDEPENDENT_AMBULATORY_CARE_PROVIDER_SITE_OTHER): Admitting: Ophthalmology

## 2024-04-28 DIAGNOSIS — H43813 Vitreous degeneration, bilateral: Secondary | ICD-10-CM

## 2024-04-28 DIAGNOSIS — H353231 Exudative age-related macular degeneration, bilateral, with active choroidal neovascularization: Secondary | ICD-10-CM

## 2024-04-28 DIAGNOSIS — I10 Essential (primary) hypertension: Secondary | ICD-10-CM

## 2024-04-28 DIAGNOSIS — H35033 Hypertensive retinopathy, bilateral: Secondary | ICD-10-CM | POA: Diagnosis not present

## 2024-06-09 ENCOUNTER — Encounter (INDEPENDENT_AMBULATORY_CARE_PROVIDER_SITE_OTHER): Admitting: Ophthalmology

## 2024-07-07 ENCOUNTER — Ambulatory Visit: Admitting: Podiatry
